# Patient Record
Sex: Female | Born: 1965 | Race: Black or African American | Hispanic: No | Smoking: Former smoker
Health system: Southern US, Community
[De-identification: ages and names within clinical notes are randomized; demographics above are authoritative.]

## PROBLEM LIST (undated history)

## (undated) DIAGNOSIS — J449 Chronic obstructive pulmonary disease, unspecified: Secondary | ICD-10-CM

## (undated) DIAGNOSIS — I214 Non-ST elevation (NSTEMI) myocardial infarction: Secondary | ICD-10-CM

## (undated) DIAGNOSIS — I509 Heart failure, unspecified: Secondary | ICD-10-CM

## (undated) DIAGNOSIS — I5181 Takotsubo syndrome: Secondary | ICD-10-CM

## (undated) DIAGNOSIS — E785 Hyperlipidemia, unspecified: Secondary | ICD-10-CM

## (undated) HISTORY — DX: Takotsubo syndrome: I51.81

## (undated) HISTORY — PX: WISDOM TOOTH EXTRACTION: SHX21

---

## 2000-09-29 ENCOUNTER — Inpatient Hospital Stay (HOSPITAL_COMMUNITY): Admission: AD | Admit: 2000-09-29 | Discharge: 2000-09-29 | Payer: Self-pay | Admitting: Obstetrics

## 2001-01-04 ENCOUNTER — Emergency Department (HOSPITAL_COMMUNITY): Admission: EM | Admit: 2001-01-04 | Discharge: 2001-01-04 | Payer: Self-pay | Admitting: Emergency Medicine

## 2001-06-22 ENCOUNTER — Encounter (HOSPITAL_BASED_OUTPATIENT_CLINIC_OR_DEPARTMENT_OTHER): Payer: Self-pay | Admitting: General Surgery

## 2001-06-24 ENCOUNTER — Encounter (INDEPENDENT_AMBULATORY_CARE_PROVIDER_SITE_OTHER): Payer: Self-pay | Admitting: *Deleted

## 2001-06-24 ENCOUNTER — Ambulatory Visit (HOSPITAL_COMMUNITY): Admission: RE | Admit: 2001-06-24 | Discharge: 2001-06-24 | Payer: Self-pay | Admitting: General Surgery

## 2005-07-02 ENCOUNTER — Emergency Department (HOSPITAL_COMMUNITY): Admission: EM | Admit: 2005-07-02 | Discharge: 2005-07-02 | Payer: Self-pay | Admitting: Family Medicine

## 2008-03-18 ENCOUNTER — Emergency Department (HOSPITAL_COMMUNITY): Admission: EM | Admit: 2008-03-18 | Discharge: 2008-03-18 | Payer: Self-pay | Admitting: Emergency Medicine

## 2011-04-03 NOTE — Op Note (Signed)
[name]  Patient:    Caitlin [name], Caitlin [name]                       MRN: [id number] Proc. Date: 06/24/01 Adm. Date:  [id number] Disc. Date: [id number] Attending:  Sonda Primes                           Operative Report  PREOPERATIVE DIAGNOSES: 1. Inclusion cyst of right breast. 2. Lipoma, right upper arm.  POSTOPERATIVE DIAGNOSES: 1. Inclusion cyst of right breast. 2. Lipoma, right upper arm.  PROCEDURES: 1. Excision of inclusion cyst of right breast. 2. Excision of lipoma of right upper arm.  SURGEON:  Mardene Celeste. Lurene Shadow, M.D.  ASSISTANT:  Nurse.  ANESTHESIA:  General.  CLINICAL NOTE:  This patient is a 45 year old woman presenting with an enlarging inclusion cyst at the areolar border of the right breast, located somewhat medially, measuring approximately 2.5 cm in greatest diameter, and a very large, approximately 20 cm lipoma of the right upper arm posterior located over the trapezius muscles.  She is brought to the operating room now after the discussion of the procedure, risks, and benefits were discussed with her and consent obtained.  DESCRIPTION OF PROCEDURE:  Following the induction of satisfactory anesthesia with the patient positioned supinely, the right breast and arm were prepped and draped to be include in the sterile operative field.  The cyst was first approached with an elliptical incision, deepened through the skin and subcutaneous tissues, down to the cyst wall.  The cyst was dissected free in its entirety without rupture and removed and forwarded for pathologic evaluation.  Hemostasis was obtained with electrocautery and the skin closed with running suture of 5-0 Monocryl and reinforced with a Steri-Strip. Attention then turned to this large mass on the posterior right arm.  I made a vertical incision on the posterior medial aspect of the arm, deepened this through the skin and subcutaneous tissue, carrying the  dissection down to the capsule of the lipoma.  The capsule was opened, and the lipoma was then dissected free from the surrounding tissues.  There were multiple loculations entering into the trapezius muscle, which had to be opened and removed.  The entire lipoma was removed and forwarded for pathologic evaluation.  Hemostasis was obtained with electrocautery.  Sponge, instrument, and sharp counts verified and the wound closed as follows:  The subcutaneous tissue was closed with interrupted 3-0 Vicryl sutures and the skin closed with a 5-0 running Monocryl suture.  The wound was also reinforced with Steri-Strips and a sterile dressing applied.  Anesthetic reversed, patient removed from the operating room to the recovery room in stable condition.  She tolerated the procedure well. DD:  06/24/01 TD:  06/25/01 Job: [id number]

## 2011-08-18 ENCOUNTER — Emergency Department (HOSPITAL_COMMUNITY)
Admission: EM | Admit: 2011-08-18 | Discharge: 2011-08-18 | Discharge status: Against Medical Advice | Payer: Self-pay | Attending: Emergency Medicine | Admitting: Emergency Medicine

## 2011-08-18 DIAGNOSIS — Z0389 Encounter for observation for other suspected diseases and conditions ruled out: Secondary | ICD-10-CM | POA: Insufficient documentation

## 2012-04-22 ENCOUNTER — Emergency Department (HOSPITAL_COMMUNITY): Payer: Medicaid Other

## 2012-04-22 ENCOUNTER — Emergency Department (HOSPITAL_COMMUNITY)
Admission: EM | Admit: 2012-04-22 | Discharge: 2012-04-22 | Disposition: A | Discharge status: Home / Self Care | Payer: Medicaid Other | Attending: Emergency Medicine | Admitting: Emergency Medicine

## 2012-04-22 ENCOUNTER — Encounter (HOSPITAL_COMMUNITY): Payer: Self-pay | Admitting: *Deleted

## 2012-04-22 DIAGNOSIS — R079 Chest pain, unspecified: Secondary | ICD-10-CM | POA: Insufficient documentation

## 2012-04-22 DIAGNOSIS — R059 Cough, unspecified: Secondary | ICD-10-CM | POA: Insufficient documentation

## 2012-04-22 DIAGNOSIS — F17200 Nicotine dependence, unspecified, uncomplicated: Secondary | ICD-10-CM | POA: Insufficient documentation

## 2012-04-22 DIAGNOSIS — R05 Cough: Secondary | ICD-10-CM | POA: Insufficient documentation

## 2012-04-22 DIAGNOSIS — J4489 Other specified chronic obstructive pulmonary disease: Secondary | ICD-10-CM | POA: Insufficient documentation

## 2012-04-22 DIAGNOSIS — R0602 Shortness of breath: Secondary | ICD-10-CM | POA: Insufficient documentation

## 2012-04-22 DIAGNOSIS — J449 Chronic obstructive pulmonary disease, unspecified: Secondary | ICD-10-CM

## 2012-04-22 LAB — CBC
HCT: 38.4 % (ref 36.0–46.0)
MCH: 25.1 pg — ABNORMAL LOW (ref 26.0–34.0)
MCHC: 33.3 g/dL (ref 30.0–36.0)
MCV: 75.3 fL — ABNORMAL LOW (ref 78.0–100.0)
Platelets: 452 10*3/uL — ABNORMAL HIGH (ref 150–400)
RDW: 17 % — ABNORMAL HIGH (ref 11.5–15.5)

## 2012-04-22 LAB — BASIC METABOLIC PANEL
BUN: 6 mg/dL (ref 6–23)
Calcium: 10 mg/dL (ref 8.4–10.5)
Creatinine, Ser: 0.66 mg/dL (ref 0.50–1.10)
GFR calc Af Amer: >90 mL/min (ref >90)

## 2012-04-22 LAB — PRO B NATRIURETIC PEPTIDE: Pro B Natriuretic peptide (BNP): 79 pg/mL (ref 0–125)

## 2012-04-22 MED ORDER — ALBUTEROL SULFATE (5 MG/ML) 0.5% IN NEBU
2.5000 mg | INHALATION_SOLUTION | Freq: Once | RESPIRATORY_TRACT | Status: AC
  Administered 2012-04-22: 2.5 mg via RESPIRATORY_TRACT
  Filled 2012-04-22: qty 0.5

## 2012-04-22 MED ORDER — IPRATROPIUM BROMIDE 0.02 % IN SOLN
RESPIRATORY_TRACT | Status: AC
  Administered 2012-04-22: 14:00:00
  Filled 2012-04-22: qty 2.5

## 2012-04-22 MED ORDER — ALBUTEROL SULFATE (5 MG/ML) 0.5% IN NEBU
INHALATION_SOLUTION | RESPIRATORY_TRACT | Status: AC
  Administered 2012-04-22: 14:00:00
  Filled 2012-04-22: qty 1

## 2012-04-22 MED ORDER — ALBUTEROL SULFATE (5 MG/ML) 0.5% IN NEBU
INHALATION_SOLUTION | RESPIRATORY_TRACT | Status: AC
  Filled 2012-04-22: qty 0.5

## 2012-04-22 MED ORDER — PREDNISONE 20 MG PO TABS: 60.0000 mg | ORAL_TABLET | Freq: Every day | ORAL | Status: DC

## 2012-04-22 MED ORDER — METHYLPREDNISOLONE SODIUM SUCC 125 MG IJ SOLR
125.0000 mg | Freq: Once | INTRAMUSCULAR | Status: AC
  Administered 2012-04-22: 125 mg via INTRAVENOUS
  Filled 2012-04-22 (×2): qty 2

## 2012-04-22 MED ORDER — IPRATROPIUM BROMIDE 0.02 % IN SOLN
0.5000 mg | Freq: Once | RESPIRATORY_TRACT | Status: AC
  Administered 2012-04-22: 0.5 mg via RESPIRATORY_TRACT
  Filled 2012-04-22: qty 2.5

## 2012-04-22 MED ORDER — ALBUTEROL SULFATE HFA 108 (90 BASE) MCG/ACT IN AERS
2.0000 | INHALATION_SPRAY | RESPIRATORY_TRACT | Status: DC | PRN
  Administered 2012-04-22: 2 via RESPIRATORY_TRACT
  Filled 2012-04-22: qty 6.7

## 2012-04-22 NOTE — ED Provider Notes (Addendum)
History     CSN: [id number]  Arrival date & time 04/22/12  1054   First MD Initiated Contact with Patient 04/22/12 1339      Chief Complaint  Patient presents with  . Shortness of Breath  . Chest Pain    (Consider location/radiation/quality/duration/timing/severity/associated sxs/prior treatment) Patient is a 46 y.o. female presenting with shortness of breath and chest pain. The history is provided by the patient.  Shortness of Breath  Associated symptoms include chest pain and shortness of breath.  Chest Pain Primary symptoms include shortness of breath.   She last 2 days with a cough productive of brown sputum. This is been associated with severe dyspnea denies fever. Symptoms are worse at night. Nothing makes her feel any better. She has not taken any medication. She does not recall having episodes like this before. She does smoke about three quarters of a pack of cigarettes a day.  History reviewed. No pertinent past medical history.  History reviewed. No pertinent past surgical history.  No family history on file.  History  Substance Use Topics  . Smoking status: Current Everyday Smoker  . Smokeless tobacco: Not on file  . Alcohol Use: No    OB History    Grav Para Term Preterm Abortions TAB SAB Ect Mult Living                  Review of Systems  Respiratory: Positive for shortness of breath.   Cardiovascular: Positive for chest pain.  All other systems reviewed and are negative.    Allergies  Review of patient's allergies indicates no known allergies.  Home Medications  No current outpatient prescriptions on file.  BP 131/86  Pulse 119  Temp(Src) 98.7 F (37.1 C) (Oral)  Resp 20  Ht 5\' 9"  (1.753 m)  Wt 133 lb (60.328 kg)  BMI 19.64 kg/m2  SpO2 100%  LMP 04/01/2012  Physical Exam  Nursing note and vitals reviewed.  female in mild respiratory distress. Vital signs are significant for tachycardia with heart rate 128, tachypnea with respiratory  rate of 40. Oxygen saturation is 90% which is normal. Head is normocephalic and atraumatic. PERRLA, EOMI. Oropharynx is clear. Neck is nontender and supple without adenopathy or JVD. Lungs have diffuse wheezes without rales or rhonchi. Heart has regular rate and rhythm which is tachycardic. No murmurs heard. Abdomen is soft, flat, nontender without masses or hepatosplenomegaly. Extremities have no cyanosis or edema, full range of motion is present. Warm and dry without rash. Neurologic: Mental status is normal, cranial nerves are intact, there are no motor or sensory deficits.  ED Course  Procedures (including critical care time)  Results for orders placed during the hospital encounter of 04/22/12  CBC      Component Value Range   WBC 8.7  4.0 - 10.5 (K/uL)   RBC 5.10  3.87 - 5.11 (MIL/uL)   Hemoglobin 12.8  12.0 - 15.0 (g/dL)   HCT 40.9  81.1 - 91.4 (%)   MCV 75.3 (*) 78.0 - 100.0 (fL)   MCH 25.1 (*) 26.0 - 34.0 (pg)   MCHC 33.3  30.0 - 36.0 (g/dL)   RDW 78.2 (*) 95.6 - 15.5 (%)   Platelets 452 (*) 150 - 400 (K/uL)  BASIC METABOLIC PANEL      Component Value Range   Sodium 140  135 - 145 (mEq/L)   Potassium 4.4  3.5 - 5.1 (mEq/L)   Chloride 106  96 - 112 (mEq/L)   CO2 20  19 - 32 (mEq/L)   Glucose, Bld 90  70 - 99 (mg/dL)   BUN 6  6 - 23 (mg/dL)   Creatinine, Ser 1.61  0.50 - 1.10 (mg/dL)   Calcium 09.6  8.4 - 10.5 (mg/dL)   GFR calc non Af Amer >90  >90 (mL/min)   GFR calc Af Amer >90  >90 (mL/min)  PRO B NATRIURETIC PEPTIDE      Component Value Range   Pro B Natriuretic peptide (BNP) 79.0  0 - 125 (pg/mL)  POCT I-STAT TROPONIN I      Component Value Range   Troponin i, poc 0.00  0.00 - 0.08 (ng/mL)   Comment 3            Dg Chest 2 View  04/22/2012  *RADIOLOGY REPORT*  Clinical Data: Cough, shortness of breath for several days, mid chest pain, smoking history  CHEST - 2 VIEW  Comparison: None.  Findings: The lungs are clear but very hyperaerated consistent with an element of  obstructive pulmonary disease.  No pneumothorax is seen.  Mediastinal contours are normal.  The heart is within normal limits in size.  No bony abnormality is seen.  IMPRESSION: Significant hyperaeration consistent with COPD.  No active process.  Original Report Authenticated By: Juline Patch, M.D.      1. COPD (chronic obstructive pulmonary disease)       MDM  Acute bronchospasm, rule out pneumonia. She'll be given steroids and albuterol with Atrovent treatment, she is more comfortable. She is still using accessory muscles but is less distress.   At the completion to her treatment, she was feeling much better. She was no longer using accessory muscles of respiration and there were only faint wheezes heard. She will be given a second albuterol nebulizer treatment with a prescription for prednisone and albuterol inhaler.   Dione Booze, MD 04/22/12 1626   Date: 07/24/2012  Rate: 116  Rhythm: sinus tachycardia  QRS Axis: normal  Intervals: normal  ST/T Wave abnormalities: normal  Conduction Disutrbances:none  Narrative Interpretation: Sinus tachycardia, left atrial hypertrophy and right atrial hypertrophy. When compared with ECG of 07/02/2005, by atrial enlargement is now present.  Old EKG Reviewed: changes noted    Dione Booze, MD 07/24/12 1622

## 2012-04-22 NOTE — Discharge Instructions (Signed)
Chronic Obstructive Pulmonary Disease Chronic obstructive pulmonary disease (COPD) is a condition in which airflow from the lungs is restricted. The lungs can never return to normal, but there are measures you can take which will improve them and make you feel better. CAUSES   Smoking.   Exposure to secondhand smoke.   Breathing in irritants (pollution, cigarette smoke, strong smells, aerosol sprays, paint fumes).   History of lung infections.  TREATMENT  Treatment focuses on making you comfortable (supportive care). Your caregiver may prescribe medications (inhaled or pills) to help improve your breathing. HOME CARE INSTRUCTIONS   If you smoke, stop smoking.   Avoid exposure to smoke, chemicals, and fumes that aggravate your breathing.   Take antibiotic medicines as directed by your caregiver.   Avoid medicines that dry up your system and slow down the elimination of secretions (antihistamines and cough syrups). This decreases respiratory capacity and may lead to infections.   Drink enough water and fluids to keep your urine clear or pale yellow. This loosens secretions.   Use humidifiers at home and at your bedside if they do not make breathing difficult.   Receive all protective vaccines your caregiver suggests, especially pneumococcal and influenza.   Use home oxygen as suggested.   Stay active. Exercise and physical activity will help maintain your ability to do things you want to do.   Eat a healthy diet.  SEEK MEDICAL CARE IF:   You develop pus-like mucus (sputum).   Breathing is more labored or exercise becomes difficult to do.   You are running out of the medicine you take for your breathing.  SEEK IMMEDIATE MEDICAL CARE IF:   You have a rapid heart rate.   You have agitation, confusion, tremors, or are in a stupor (family members may need to observe this).   It becomes difficult to breathe.   You develop chest pain.   You have a fever.  MAKE SURE YOU:    Understand these instructions.   Will watch your condition.   Will get help right away if you are not doing well or get worse.  Document Released: 08/12/2005 Document Revised: 10/22/2011 Document Reviewed: 01/02/2011 [facility] Patient Information 2012 [place], [place].  Smoking Hazards Smoking cigarettes is extremely bad for your health. Tobacco smoke has over 200 known poisons in it. There are over 60 chemicals in tobacco smoke that cause cancer. Some of the chemicals found in cigarette smoke include:   Cyanide.   Benzene.   Formaldehyde.   Methanol (wood alcohol).   Acetylene (fuel used in welding torches).   Ammonia.  Cigarette smoke also contains the poisonous gases nitrogen oxide and carbon monoxide.  Cigarette smokers have an increased risk of many serious medical problems, including:  Lung cancer.   Lung disease (such as pneumonia, bronchitis, and emphysema).   Heart attack and chest pain due to the heart not getting enough oxygen (angina).   Heart disease and peripheral blood vessel disease.   Hypertension.   Stroke.   Oral cancer (cancer of the lip, mouth, or voice box).   Bladder cancer.   Pancreatic cancer.   Cervical cancer.   Pregnancy complications, including premature birth.   Low birthweight babies.   Early menopause.   Lower estrogen level for women.   Infertility.   Facial wrinkles.   Blindness.   Increased risk of broken bones (fractures).   Senile dementia.   Stillbirths and smaller newborn babies, birth defects, and genetic damage to sperm.   Stomach ulcers  and internal bleeding.  Children of smokers have an increased risk of the following, because of secondhand smoke exposure:   Sudden infant death syndrome (SIDS).   Respiratory infections.   Lung cancer.   Heart disease.   Ear infections.  Smoking causes approximately:  90% of all lung cancer deaths in men.   80% of all lung cancer deaths in women.   90% of  deaths from chronic obstructive lung disease.  Compared with nonsmokers, smoking increases the risk of:  Coronary heart disease by 2 to 4 times.   Stroke by 2 to 4 times.   Men developing lung cancer by 23 times.   Women developing lung cancer by 13 times.   Dying from chronic obstructive lung diseases by 12 times.  Someone who smokes 2 packs a day loses about 8 years of his or her life. Even smoking lightly shortens your life expectancy by several years. You can greatly reduce the risk of medical problems for you and your family by stopping now. Smoking is the most preventable cause of death and disease in our society. Within days of quitting smoking, your circulation returns to normal, you decrease the risk of having a heart attack, and your lung capacity improves. There may be some increased phlegm in the first few days after quitting, and it may take months for your lungs to clear up completely. Quitting for 10 years cuts your lung cancer risk to almost that of a nonsmoker. WHY IS SMOKING ADDICTIVE?  Nicotine is the chemical agent in tobacco that is capable of causing addiction or dependence.   When you smoke and inhale, nicotine is absorbed rapidly into the bloodstream through your lungs. Nicotine absorbed through the lungs is capable of creating a powerful addiction. Both inhaled and non-inhaled nicotine may be addictive.   Addiction studies of cigarettes and spit tobacco show that addiction to nicotine occurs mainly during the teen years, when young people begin using tobacco products.  WHAT ARE THE BENEFITS OF QUITTING?  There are many health benefits to quitting smoking.   Likelihood of developing cancer and heart disease decreases. Health improvements are seen almost immediately.   Blood pressure, pulse rate, and breathing patterns start returning to normal soon after quitting.   People who quit may see an improvement in their overall quality of life.  Some people choose to quit  all at once. Other options include nicotine replacement products, such as patches, gum, and nasal sprays. Do not use these products without first checking with your caregiver. QUITTING SMOKING It is not easy to quit smoking. Nicotine is addicting, and longtime habits are hard to change. To start, you can write down all your reasons for quitting, tell your family and friends you want to quit, and ask for their help. Throw your cigarettes away, chew gum or cinnamon sticks, keep your hands busy, and drink extra water or juice. Go for walks and practice deep breathing to relax. Think of all the money you are saving: around $1,000 a year, for the average pack-a-day smoker. Nicotine patches and gum have been shown to improve success at efforts to stop smoking. Zyban (bupropion) is an anti-depressant drug that can be prescribed to reduce nicotine withdrawal symptoms and to suppress the urge to smoke. Smoking is an addiction with both physical and psychological effects. Joining a stop-smoking support group can help you cope with the emotional issues. For more information and advice on programs to stop smoking, call your doctor, your local hospital, or these  organizations:  American Lung Association - 1-800-LUNGUSA   American Cancer Society - 1-800-ACS-2345  Document Released: 12/10/2004 Document Revised: 10/22/2011 Document Reviewed: 08/14/2009 [facility] Patient Information 2012 [place], [place].  Albuterol inhalation aerosol What is this medicine? ALBUTEROL (al Gaspar Bidding) is a bronchodilator. It helps open up the airways in your lungs to make it easier to breathe. This medicine is used to treat and to prevent bronchospasm. This medicine may be used for other purposes; ask your health care provider or pharmacist if you have questions. What should I tell my health care provider before I take this medicine? They need to know if you have any of the following conditions: -diabetes -heart disease or irregular  heartbeat -high blood pressure -pheochromocytoma -seizures -thyroid disease -an unusual or allergic reaction to albuterol, levalbuterol, sulfites, other medicines, foods, dyes, or preservatives -pregnant or trying to get pregnant -breast-feeding How should I use this medicine? This medicine is for inhalation through the mouth. Follow the directions on your prescription label. Take your medicine at regular intervals. Do not use more often than directed. Make sure that you are using your inhaler correctly. Ask you doctor or health care provider if you have any questions. Use this medicine before you use any other inhaler. Wait 5 minutes or more before between using different inhalers. Talk to your pediatrician regarding the use of this medicine in children. Special care may be needed. Overdosage: If you think you have taken too much of this medicine contact a poison control center or emergency room at once. NOTE: This medicine is only for you. Do not share this medicine with others. What if I miss a dose? If you miss a dose, use it as soon as you can. If it is almost time for your next dose, use only that dose. Do not use double or extra doses. What may interact with this medicine? -anti-infectives like chloroquine and pentamidine -caffeine -cisapride -diuretics -medicines for colds -medicines for depression or for emotional or psychotic conditions -medicines for weight loss including some herbal products -methadone -some antibiotics like clarithromycin, erythromycin, levofloxacin, and linezolid -some heart medicines -steroid hormones like dexamethasone, cortisone, hydrocortisone -theophylline -thyroid hormones This list may not describe all possible interactions. Give your health care provider a list of all the medicines, herbs, non-prescription drugs, or dietary supplements you use. Also tell them if you smoke, drink alcohol, or use illegal drugs. Some items may interact with your  medicine. What should I watch for while using this medicine? Tell your doctor or health care professional if your symptoms do not improve. Do not use extra albuterol. If your asthma or bronchitis gets worse while you are using this medicine, call your doctor right away. If your mouth gets dry try chewing sugarless gum or sucking hard candy. Drink water as directed. What side effects may I notice from receiving this medicine? Side effects that you should report to your doctor or health care professional as soon as possible: -allergic reactions like skin rash, itching or hives, swelling of the face, lips, or tongue -breathing problems -chest pain -feeling faint or lightheaded, falls -high blood pressure -irregular heartbeat -fever -muscle cramps or weakness -pain, tingling, numbness in the hands or feet -vomiting Side effects that usually do not require medical attention (report to your doctor or health care professional if they continue or are bothersome): -cough -difficulty sleeping -headache -nervousness or trembling -stomach upset -stuffy or runny nose -throat irritation -unusual taste This list may not describe all possible side effects. Call  your doctor for medical advice about side effects. You may report side effects to FDA at 1-800-FDA-1088. Where should I keep my medicine? Keep out of the reach of children. Store at room temperature between 15 and 30 degrees C (59 and 86 degrees F). The contents are under pressure and may burst when exposed to heat or flame. Do not freeze. This medicine does not work as well if it is too cold. Throw away any unused medicine after the expiration date. Inhalers need to be thrown away after the labeled number of puffs have been used or by the expiration date; whichever comes first. Ventolin HFA should be thrown away 12 months after removing from foil pouch. Check the instructions that come with your medicine. NOTE: This sheet is a summary. It may  not cover all possible information. If you have questions about this medicine, talk to your doctor, pharmacist, or health care provider.  2012, Elsevier/Gold Standard. (03/20/2011 11:00:52 AM)  Prednisone tablets What is this medicine? PREDNISONE (PRED ni sone) is a corticosteroid. It is commonly used to treat inflammation of the skin, joints, lungs, and other organs. Common conditions treated include asthma, allergies, and arthritis. It is also used for other conditions, such as blood disorders and diseases of the adrenal glands. This medicine may be used for other purposes; ask your health care provider or pharmacist if you have questions. What should I tell my health care provider before I take this medicine? They need to know if you have any of these conditions: -Cushing's syndrome -diabetes -glaucoma -heart disease -high blood pressure -infection (especially a virus infection such as chickenpox, cold sores, or herpes) -kidney disease -liver disease -mental illness -myasthenia gravis -osteoporosis -seizures -stomach or intestine problems -thyroid disease -an unusual or allergic reaction to lactose, prednisone, other medicines, foods, dyes, or preservatives -pregnant or trying to get pregnant -breast-feeding How should I use this medicine? Take this medicine by mouth with a glass of water. Follow the directions on the prescription label. Take this medicine with food. If you are taking this medicine once a day, take it in the morning. Do not take more medicine than you are told to take. Do not suddenly stop taking your medicine because you may develop a severe reaction. Your doctor will tell you how much medicine to take. If your doctor wants you to stop the medicine, the dose may be slowly lowered over time to avoid any side effects. Talk to your pediatrician regarding the use of this medicine in children. Special care may be needed. Overdosage: If you think you have taken too much of  this medicine contact a poison control center or emergency room at once. NOTE: This medicine is only for you. Do not share this medicine with others. What if I miss a dose? If you miss a dose, take it as soon as you can. If it is almost time for your next dose, talk to your doctor or health care professional. You may need to miss a dose or take an extra dose. Do not take double or extra doses without advice. What may interact with this medicine? Do not take this medicine with any of the following medications: -metyrapone -mifepristone This medicine may also interact with the following medications: -aminoglutethimide -amphotericin B -aspirin and aspirin-like medicines -barbiturates -certain medicines for diabetes, like glipizide or glyburide -cholestyramine -cholinesterase inhibitors -cyclosporine -digoxin -diuretics -ephedrine -female hormones, like estrogens and birth control pills -isoniazid -ketoconazole -NSAIDS, medicines for pain and inflammation, like ibuprofen or naproxen -phenytoin -  rifampin -toxoids -vaccines -warfarin This list may not describe all possible interactions. Give your health care provider a list of all the medicines, herbs, non-prescription drugs, or dietary supplements you use. Also tell them if you smoke, drink alcohol, or use illegal drugs. Some items may interact with your medicine. What should I watch for while using this medicine? Visit your doctor or health care professional for regular checks on your progress. If you are taking this medicine over a prolonged period, carry an identification card with your name and address, the type and dose of your medicine, and your doctor's name and address. This medicine may increase your risk of getting an infection. Tell your doctor or health care professional if you are around anyone with measles or chickenpox, or if you develop sores or blisters that do not heal properly. If you are going to have surgery, tell  your doctor or health care professional that you have taken this medicine within the last twelve months. Ask your doctor or health care professional about your diet. You may need to lower the amount of salt you eat. This medicine may affect blood sugar levels. If you have diabetes, check with your doctor or health care professional before you change your diet or the dose of your diabetic medicine. What side effects may I notice from receiving this medicine? Side effects that you should report to your doctor or health care professional as soon as possible: -allergic reactions like skin rash, itching or hives, swelling of the face, lips, or tongue -changes in emotions or moods -changes in vision -depressed mood -eye pain -fever or chills, cough, sore throat, pain or difficulty passing urine -increased thirst -swelling of ankles, feet Side effects that usually do not require medical attention (report to your doctor or health care professional if they continue or are bothersome): -confusion, excitement, restlessness -headache -nausea, vomiting -skin problems, acne, thin and shiny skin -trouble sleeping -weight gain This list may not describe all possible side effects. Call your doctor for medical advice about side effects. You may report side effects to FDA at 1-800-FDA-1088. Where should I keep my medicine? Keep out of the reach of children. Store at room temperature between 15 and 30 degrees C (59 and 86 degrees F). Protect from light. Keep container tightly closed. Throw away any unused medicine after the expiration date. NOTE: This sheet is a summary. It may not cover all possible information. If you have questions about this medicine, talk to your doctor, pharmacist, or health care provider.  2012, Elsevier/Gold Standard. (06/18/2011 10:57:14 AM)

## 2012-04-22 NOTE — ED Notes (Signed)
Pt receiving breathing tx.  St's she has to leave by 4:00.

## 2012-04-22 NOTE — ED Notes (Signed)
Patient with reported cough for 2 days.  She states today she is short of breath to the point she can hardly walk.  Patient states her chest is killing her

## 2012-04-25 ENCOUNTER — Encounter (HOSPITAL_COMMUNITY): Payer: Self-pay | Admitting: Emergency Medicine

## 2016-09-09 ENCOUNTER — Encounter (HOSPITAL_COMMUNITY): Payer: Self-pay | Admitting: Emergency Medicine

## 2016-09-09 ENCOUNTER — Ambulatory Visit (HOSPITAL_COMMUNITY)
Admission: EM | Admit: 2016-09-09 | Discharge: 2016-09-09 | Disposition: A | Discharge status: Home / Self Care | Payer: Self-pay | Attending: Emergency Medicine | Admitting: Emergency Medicine

## 2016-09-09 DIAGNOSIS — N939 Abnormal uterine and vaginal bleeding, unspecified: Secondary | ICD-10-CM

## 2016-09-09 DIAGNOSIS — J449 Chronic obstructive pulmonary disease, unspecified: Secondary | ICD-10-CM | POA: Insufficient documentation

## 2016-09-09 DIAGNOSIS — F17200 Nicotine dependence, unspecified, uncomplicated: Secondary | ICD-10-CM | POA: Insufficient documentation

## 2016-09-09 MED ORDER — IBUPROFEN 600 MG PO TABS: 600.0000 mg | ORAL_TABLET | Freq: Four times a day (QID) | ORAL | 0 refills | Status: DC | PRN

## 2016-09-09 NOTE — ED Notes (Signed)
Verified phone number 

## 2016-09-09 NOTE — ED Notes (Signed)
Patient has some mild, crampy feeling in abdomen.  Noticed this last night.  Patient has some vaginal spotting.  Patient has not had a period in approximately 2 years and reports last pap was possible 20 years ago.

## 2016-09-09 NOTE — ED Provider Notes (Signed)
HPI  SUBJECTIVE:  Caitlin [name] is a 50 y.o. female who presents with light vaginal bleeding that she describes as spotting and occasionally heavy starting 3 days ago. She is going through one pad per day. She states that she had intercourse over the weekend, but was fine for a day or 2. She reports mild low back pain. No aggravating or alleviating factors. She has not tried anything for this. She denies nausea, vomiting, fevers. No abdominal pain, pelvic pain. No dysuria, urgency, frequency, cloudy or odorous urine, hematuria. No genital rash, discharge, vaginal odor. She is in a new monogamous relationship with a female partner, who is asymptomatic. STDs are not a concern today. She has a past medical history COPD. She is a smoker. She has no past medical history of cancer, coagulopathy, she is not on any antiplatelets or anticoagulants. No history of gonorrhea, chlamydia, herpes, HIV, syphilis, Trichomonas, BV, yeast infections. No history of PID. LMP: 2 years ago. PMD: None.    History reviewed. No pertinent past medical history.  History reviewed. No pertinent surgical history.  History reviewed. No pertinent family history.  Social History  Substance Use Topics  . Smoking status: Current Every Day Smoker  . Smokeless tobacco: Never Used  . Alcohol use No    No current facility-administered medications for this encounter.   Current Outpatient Prescriptions:  .  ibuprofen (ADVIL,MOTRIN) 600 MG tablet, Take 1 tablet (600 mg total) by mouth every 6 (six) hours as needed., Disp: 30 tablet, Rfl: 0  No Known Allergies   ROS  As noted in HPI.   Physical Exam  BP 160/94 (BP Location: Right Arm)   Pulse 67   Temp 98.4 F (36.9 C) (Oral)   Resp 18   LMP 09/09/2014 (Approximate)   SpO2 99%   Constitutional: Well developed, well nourished, no acute distress Eyes:  EOMI, conjunctiva normal bilaterally HENT: Normocephalic, atraumatic,mucus membranes moist Respiratory: Normal  inspiratory effort Cardiovascular: Normal rate GI: nondistended, Soft, nontender. No suprapubic tenderness. GU: Normal external genitalia. Small amount of brownish vaginal bleeding from os. No active bleeding. No hemorrhage. No mass. No trauma to the cervix. No discharge. No odor. No CMT. No adnexal mass. Uterus smooth, nontender. Chaperone present during exam. skin: No rash, skin intact Musculoskeletal: no deformities Neurologic: Alert & oriented x 3, no focal neuro deficits Psychiatric: Speech and behavior appropriate   ED Course   Medications - No data to display  Orders Placed This Encounter  Procedures  . Pelvic exam    Standing Status:   Standing    Number of Occurrences:   1  . Ambulatory referral to Gynecology    Referral Priority:   Routine    Referral Type:   Consultation    Referral Reason:   Specialty Services Required    Requested Specialty:   Gynecology    Number of Visits Requested:   1    No results found for this or any previous visit (from the past 24 hour(s)). No results found.  ED Clinical Impression  Vaginal bleeding   ED Assessment/Plan  Vitals normal. Patient appears well. Abdomen benign. Presentation most consistent with abnormal uterine bleeding. We'll not treat empirically for STDs today. Feel the patient is low risk enough. Also not treating for BV, yeast, or Trichomonas as patient has no history of this. We'll wait for lab results. We'll send home with 600 mg ibuprofen, provide GYN referral. She is to give [place] a working phone number so that we can  contact her if she and her partner need treatment.  Discussed MDM, plan and followup with patient. Discussed sn/sx that should prompt return to the ED. Patient  agrees with plan.   Meds ordered this encounter  Medications  . ibuprofen (ADVIL,MOTRIN) 600 MG tablet    Sig: Take 1 tablet (600 mg total) by mouth every 6 (six) hours as needed.    Dispense:  30 tablet    Refill:  0    *This clinic note  was created using [employer]. Therefore, there may be occasional mistakes despite careful proofreading.  ?   Melynda Ripple, MD 09/09/16 [phone]

## 2016-09-09 NOTE — Discharge Instructions (Signed)
Take the medication as written. Give [place] a working phone number so that we can contact you if needed. Refrain from sexual contact until you know your results and your partner are treated if necessary.   Go to [email or web address] to look up your medications. This will give you a list of where you can find your prescriptions at the most affordable prices.

## 2016-09-09 NOTE — ED Triage Notes (Signed)
The patient presented to the [facility] with a complaint of vaginal bleeding that started 3 days ago. The patient reported no pain but cramping. The patient stated that her last menstrual period was 2 years ago.

## 2016-09-10 LAB — CERVICOVAGINAL ANCILLARY ONLY
CHLAMYDIA, DNA PROBE: NEGATIVE
NEISSERIA GONORRHEA: NEGATIVE
Wet Prep (BD Affirm): POSITIVE — AB

## 2016-09-16 ENCOUNTER — Telehealth (HOSPITAL_COMMUNITY): Payer: Self-pay | Admitting: Emergency Medicine

## 2016-09-16 NOTE — Telephone Encounter (Signed)
LM on [phone] Need to give lab results and to see how pt is doing from recent visit on 10/25 Notified pt in gen message that there is NO need to call back Unless not feeling any better, not tolerating meds well or if wanting to know lab results. Also let pt know labs can be obtained from [facility]

## 2016-09-16 NOTE — Telephone Encounter (Signed)
Pt called back.... Called pt and notified of recent lab results Pt ID'd properly... Reports feeling better and sx have subsided States she does not need further tx.  Adv pt if sx are not getting better to return or to f/u w/PCP Education on safe sex given Pt verb understanding.

## 2016-09-16 NOTE — Telephone Encounter (Signed)
-----   Message from Sherlene Shams, MD sent at 09/12/2016 11:14 AM EDT ----- Clinical staff, please let patient know that test for gardnerella (bacterial vaginosis) was positive.   This only needs to be treated if patient has symptoms such as vaginal irritation/discharge. If having vaginal irritation/discharge, would treat with metronidazole '500mg'$  bid x 7d #14, no refills. Bacterial vaginosis does not cause abnormal uterine bleeding (spotting), which patient presented with.   She will still need to folllowup with gynecology as discussed at [facility] visit 09/09/16.  LM

## 2016-11-16 HISTORY — PX: TRACHEOSTOMY CLOSURE: SHX458

## 2017-07-15 ENCOUNTER — Encounter (HOSPITAL_COMMUNITY)
Admission: EM | Disposition: A | Discharge status: Home / Self Care | Payer: Self-pay | Source: Home / Self Care | Attending: Pulmonary Disease

## 2017-07-15 ENCOUNTER — Inpatient Hospital Stay (HOSPITAL_COMMUNITY): Payer: Self-pay

## 2017-07-15 ENCOUNTER — Other Ambulatory Visit: Payer: Self-pay

## 2017-07-15 ENCOUNTER — Emergency Department (HOSPITAL_COMMUNITY): Payer: Self-pay

## 2017-07-15 ENCOUNTER — Encounter (HOSPITAL_COMMUNITY): Payer: Self-pay | Admitting: Emergency Medicine

## 2017-07-15 ENCOUNTER — Inpatient Hospital Stay (HOSPITAL_COMMUNITY)
Admission: EM | Admit: 2017-07-15 | Discharge: 2017-07-19 | DRG: 280 | Disposition: A | Discharge status: Home / Self Care | Payer: Medicaid Other | Attending: Internal Medicine | Admitting: Internal Medicine

## 2017-07-15 DIAGNOSIS — I5043 Acute on chronic combined systolic (congestive) and diastolic (congestive) heart failure: Secondary | ICD-10-CM | POA: Diagnosis not present

## 2017-07-15 DIAGNOSIS — R0789 Other chest pain: Secondary | ICD-10-CM

## 2017-07-15 DIAGNOSIS — J9601 Acute respiratory failure with hypoxia: Secondary | ICD-10-CM | POA: Diagnosis not present

## 2017-07-15 DIAGNOSIS — E876 Hypokalemia: Secondary | ICD-10-CM | POA: Diagnosis not present

## 2017-07-15 DIAGNOSIS — I5041 Acute combined systolic (congestive) and diastolic (congestive) heart failure: Secondary | ICD-10-CM

## 2017-07-15 DIAGNOSIS — Z720 Tobacco use: Secondary | ICD-10-CM | POA: Insufficient documentation

## 2017-07-15 DIAGNOSIS — J449 Chronic obstructive pulmonary disease, unspecified: Secondary | ICD-10-CM

## 2017-07-15 DIAGNOSIS — I214 Non-ST elevation (NSTEMI) myocardial infarction: Secondary | ICD-10-CM | POA: Diagnosis not present

## 2017-07-15 DIAGNOSIS — R3915 Urgency of urination: Secondary | ICD-10-CM

## 2017-07-15 DIAGNOSIS — F419 Anxiety disorder, unspecified: Secondary | ICD-10-CM | POA: Diagnosis present

## 2017-07-15 DIAGNOSIS — Z9911 Dependence on respirator [ventilator] status: Secondary | ICD-10-CM

## 2017-07-15 DIAGNOSIS — I2129 ST elevation (STEMI) myocardial infarction involving other sites: Principal | ICD-10-CM | POA: Diagnosis present

## 2017-07-15 DIAGNOSIS — I21A1 Myocardial infarction type 2: Secondary | ICD-10-CM | POA: Insufficient documentation

## 2017-07-15 DIAGNOSIS — I420 Dilated cardiomyopathy: Secondary | ICD-10-CM | POA: Diagnosis not present

## 2017-07-15 DIAGNOSIS — Z8249 Family history of ischemic heart disease and other diseases of the circulatory system: Secondary | ICD-10-CM

## 2017-07-15 DIAGNOSIS — I513 Intracardiac thrombosis, not elsewhere classified: Secondary | ICD-10-CM

## 2017-07-15 DIAGNOSIS — R748 Abnormal levels of other serum enzymes: Secondary | ICD-10-CM

## 2017-07-15 DIAGNOSIS — J9600 Acute respiratory failure, unspecified whether with hypoxia or hypercapnia: Secondary | ICD-10-CM

## 2017-07-15 DIAGNOSIS — I5181 Takotsubo syndrome: Secondary | ICD-10-CM | POA: Diagnosis present

## 2017-07-15 DIAGNOSIS — F17210 Nicotine dependence, cigarettes, uncomplicated: Secondary | ICD-10-CM | POA: Diagnosis not present

## 2017-07-15 DIAGNOSIS — G9340 Encephalopathy, unspecified: Secondary | ICD-10-CM | POA: Diagnosis present

## 2017-07-15 DIAGNOSIS — J441 Chronic obstructive pulmonary disease with (acute) exacerbation: Secondary | ICD-10-CM

## 2017-07-15 DIAGNOSIS — I428 Other cardiomyopathies: Secondary | ICD-10-CM

## 2017-07-15 DIAGNOSIS — I110 Hypertensive heart disease with heart failure: Secondary | ICD-10-CM | POA: Diagnosis present

## 2017-07-15 DIAGNOSIS — Z791 Long term (current) use of non-steroidal anti-inflammatories (NSAID): Secondary | ICD-10-CM

## 2017-07-15 DIAGNOSIS — F1290 Cannabis use, unspecified, uncomplicated: Secondary | ICD-10-CM

## 2017-07-15 DIAGNOSIS — J440 Chronic obstructive pulmonary disease with acute lower respiratory infection: Secondary | ICD-10-CM | POA: Diagnosis present

## 2017-07-15 DIAGNOSIS — R0600 Dyspnea, unspecified: Secondary | ICD-10-CM

## 2017-07-15 DIAGNOSIS — R350 Frequency of micturition: Secondary | ICD-10-CM

## 2017-07-15 DIAGNOSIS — R079 Chest pain, unspecified: Secondary | ICD-10-CM

## 2017-07-15 HISTORY — PX: LEFT HEART CATH AND CORONARY ANGIOGRAPHY: CATH118249

## 2017-07-15 HISTORY — DX: Chronic obstructive pulmonary disease, unspecified: J44.9

## 2017-07-15 LAB — BASIC METABOLIC PANEL
ANION GAP: 7 (ref 5–15)
ANION GAP: 10 (ref 5–15)
BUN: 6 mg/dL (ref 6–20)
BUN: 9 mg/dL (ref 6–20)
CALCIUM: 9.6 mg/dL (ref 8.9–10.3)
CALCIUM: 9.1 mg/dL (ref 8.9–10.3)
CO2: 22 mmol/L (ref 22–32)
CO2: 20 mmol/L — ABNORMAL LOW (ref 22–32)
CREATININE: 0.74 mg/dL (ref 0.44–1.00)
Chloride: 111 mmol/L (ref 101–111)
Chloride: 113 mmol/L — ABNORMAL HIGH (ref 101–111)
Creatinine, Ser: 0.67 mg/dL (ref 0.44–1.00)
Glucose, Bld: 103 mg/dL — ABNORMAL HIGH (ref 65–99)
Glucose, Bld: 157 mg/dL — ABNORMAL HIGH (ref 65–99)
Potassium: 3.9 mmol/L (ref 3.5–5.1)
Potassium: 3.9 mmol/L (ref 3.5–5.1)
SODIUM: 140 mmol/L (ref 135–145)
SODIUM: 143 mmol/L (ref 135–145)

## 2017-07-15 LAB — TROPONIN I
TROPONIN I: 1.42 ng/mL — AB (ref <0.03)
Troponin I: 0.63 ng/mL (ref <0.03)

## 2017-07-15 LAB — POCT I-STAT 3, ART BLOOD GAS (G3+)
Acid-base deficit: 6 mmol/L — ABNORMAL HIGH (ref 0.0–2.0)
Acid-base deficit: 4 mmol/L — ABNORMAL HIGH (ref 0.0–2.0)
Bicarbonate: 24.3 mmol/L (ref 20.0–28.0)
Bicarbonate: 21.9 mmol/L (ref 20.0–28.0)
O2 SAT: 100 %
O2 SAT: 100 %
PCO2 ART: 68.6 mmHg — AB (ref 32.0–48.0)
PCO2 ART: 41.1 mmHg (ref 32.0–48.0)
PH ART: 7.332 — AB (ref 7.350–7.450)
PO2 ART: 441 mmHg — AB (ref 83.0–108.0)
PO2 ART: 262 mmHg — AB (ref 83.0–108.0)
Patient temperature: 98
Patient temperature: 97.6
TCO2: 26 mmol/L (ref 22–32)
TCO2: 23 mmol/L (ref 22–32)
pH, Arterial: 7.156 — CL (ref 7.350–7.450)

## 2017-07-15 LAB — CBC
HCT: 44.9 % (ref 36.0–46.0)
HCT: 43.4 % (ref 36.0–46.0)
HEMOGLOBIN: 15.2 g/dL — AB (ref 12.0–15.0)
Hemoglobin: 13.9 g/dL (ref 12.0–15.0)
MCH: 29 pg (ref 26.0–34.0)
MCH: 28 pg (ref 26.0–34.0)
MCHC: 33.9 g/dL (ref 30.0–36.0)
MCHC: 32 g/dL (ref 30.0–36.0)
MCV: 85.5 fL (ref 78.0–100.0)
MCV: 87.3 fL (ref 78.0–100.0)
Platelets: 345 10*3/uL (ref 150–400)
Platelets: 260 10*3/uL (ref 150–400)
RBC: 5.25 MIL/uL — AB (ref 3.87–5.11)
RBC: 4.97 MIL/uL (ref 3.87–5.11)
RDW: 14.2 % (ref 11.5–15.5)
RDW: 15 % (ref 11.5–15.5)
WBC: 9.1 10*3/uL (ref 4.0–10.5)
WBC: 12.8 10*3/uL — AB (ref 4.0–10.5)

## 2017-07-15 LAB — I-STAT TROPONIN, ED
TROPONIN I, POC: 0.4 ng/mL — AB (ref 0.00–0.08)
Troponin i, poc: 0.87 ng/mL (ref 0.00–0.08)

## 2017-07-15 LAB — HEMOGLOBIN A1C
HEMOGLOBIN A1C: 5.5 % (ref 4.8–5.6)
MEAN PLASMA GLUCOSE: 111.15 mg/dL

## 2017-07-15 LAB — POCT I-STAT TROPONIN I: TROPONIN I, POC: 1.46 ng/mL — AB (ref 0.00–0.08)

## 2017-07-15 LAB — PROTIME-INR
INR: 1.09
Prothrombin Time: 14 seconds (ref 11.4–15.2)

## 2017-07-15 LAB — I-STAT BETA HCG BLOOD, ED (MC, WL, AP ONLY): I-stat hCG, quantitative: <5 m[IU]/mL (ref <5)

## 2017-07-15 LAB — HEPARIN LEVEL (UNFRACTIONATED): Heparin Unfractionated: 0.23 IU/mL — ABNORMAL LOW (ref 0.30–0.70)

## 2017-07-15 LAB — ECHOCARDIOGRAM COMPLETE
Height: 69 in
Weight: 2080 oz

## 2017-07-15 LAB — TRIGLYCERIDES: Triglycerides: 53 mg/dL (ref <150)

## 2017-07-15 SURGERY — LEFT HEART CATH AND CORONARY ANGIOGRAPHY
Anesthesia: LOCAL

## 2017-07-15 MED ORDER — IOPAMIDOL (ISOVUE-370) INJECTION 76%
INTRAVENOUS | Status: DC | PRN
  Administered 2017-07-15: 30 mL via INTRAVENOUS

## 2017-07-15 MED ORDER — SODIUM CHLORIDE 0.9% FLUSH
3.0000 mL | Freq: Two times a day (BID) | INTRAVENOUS | Status: DC
  Administered 2017-07-15 – 2017-07-16 (×2): 3 mL via INTRAVENOUS

## 2017-07-15 MED ORDER — SODIUM CHLORIDE 0.9% FLUSH: 3.0000 mL | INTRAVENOUS | Status: DC | PRN

## 2017-07-15 MED ORDER — ACETAMINOPHEN 325 MG PO TABS: 650.0000 mg | ORAL_TABLET | Freq: Four times a day (QID) | ORAL | Status: DC | PRN

## 2017-07-15 MED ORDER — SODIUM CHLORIDE 0.9% FLUSH: 3.0000 mL | Freq: Two times a day (BID) | INTRAVENOUS | Status: DC

## 2017-07-15 MED ORDER — NITROGLYCERIN 2 % TD OINT: 1.0000 [in_us] | TOPICAL_OINTMENT | Freq: Four times a day (QID) | TRANSDERMAL | Status: DC

## 2017-07-15 MED ORDER — ALBUTEROL (5 MG/ML) CONTINUOUS INHALATION SOLN
10.0000 mg/h | INHALATION_SOLUTION | Freq: Once | RESPIRATORY_TRACT | Status: AC
  Administered 2017-07-15: 10 mg/h via RESPIRATORY_TRACT
  Filled 2017-07-15: qty 20

## 2017-07-15 MED ORDER — VERAPAMIL HCL 2.5 MG/ML IV SOLN
INTRAVENOUS | Status: AC
  Filled 2017-07-15: qty 2

## 2017-07-15 MED ORDER — PROPOFOL 1000 MG/100ML IV EMUL
0.0000 ug/kg/min | INTRAVENOUS | Status: DC
  Administered 2017-07-15: 50 ug/kg/min via INTRAVENOUS
  Administered 2017-07-15: 45 ug/kg/min via INTRAVENOUS
  Administered 2017-07-15: 50 ug/kg/min via INTRAVENOUS
  Administered 2017-07-16: 35 ug/kg/min via INTRAVENOUS
  Administered 2017-07-16: 50 ug/kg/min via INTRAVENOUS
  Filled 2017-07-15 (×2): qty 100

## 2017-07-15 MED ORDER — HEPARIN (PORCINE) IN NACL 100-0.45 UNIT/ML-% IJ SOLN
700.0000 [IU]/h | INTRAMUSCULAR | Status: DC
  Administered 2017-07-15: 700 [IU]/h via INTRAVENOUS
  Filled 2017-07-15: qty 250

## 2017-07-15 MED ORDER — SODIUM CHLORIDE 0.9 % IV SOLN
INTRAVENOUS | Status: DC
  Administered 2017-07-15: 17:00:00 via INTRAVENOUS

## 2017-07-15 MED ORDER — HEPARIN (PORCINE) IN NACL 2-0.9 UNIT/ML-% IJ SOLN
INTRAMUSCULAR | Status: AC
  Filled 2017-07-15: qty 1000

## 2017-07-15 MED ORDER — METHYLPREDNISOLONE SODIUM SUCC 125 MG IJ SOLR
125.0000 mg | Freq: Once | INTRAMUSCULAR | Status: AC
  Administered 2017-07-15: 125 mg via INTRAVENOUS
  Filled 2017-07-15: qty 2

## 2017-07-15 MED ORDER — ORAL CARE MOUTH RINSE
15.0000 mL | Freq: Four times a day (QID) | OROMUCOSAL | Status: DC
  Administered 2017-07-15 – 2017-07-16 (×3): 15 mL via OROMUCOSAL

## 2017-07-15 MED ORDER — ALBUTEROL SULFATE (2.5 MG/3ML) 0.083% IN NEBU
2.5000 mg | INHALATION_SOLUTION | RESPIRATORY_TRACT | Status: DC | PRN
Start: 2017-07-15 — End: 2017-07-19
  Administered 2017-07-15 – 2017-07-17 (×2): 2.5 mg via RESPIRATORY_TRACT
  Filled 2017-07-15 (×2): qty 3

## 2017-07-15 MED ORDER — ACETAMINOPHEN 650 MG RE SUPP: 650.0000 mg | Freq: Four times a day (QID) | RECTAL | Status: DC | PRN

## 2017-07-15 MED ORDER — DEXTROSE 5 % IV SOLN
500.0000 mg | INTRAVENOUS | Status: DC
  Administered 2017-07-15 – 2017-07-16 (×2): 500 mg via INTRAVENOUS
  Filled 2017-07-15 (×3): qty 500

## 2017-07-15 MED ORDER — METHYLPREDNISOLONE SODIUM SUCC 125 MG IJ SOLR
125.0000 mg | Freq: Three times a day (TID) | INTRAMUSCULAR | Status: DC
Start: 2017-07-15 — End: 2017-07-16
  Administered 2017-07-15 – 2017-07-16 (×3): 125 mg via INTRAVENOUS
  Filled 2017-07-15 (×3): qty 2

## 2017-07-15 MED ORDER — IOPAMIDOL (ISOVUE-370) INJECTION 76%
INTRAVENOUS | Status: AC
  Filled 2017-07-15: qty 100

## 2017-07-15 MED ORDER — MIDAZOLAM HCL 5 MG/5ML IJ SOLN
INTRAMUSCULAR | Status: AC | PRN
  Administered 2017-07-15: 2 mg via INTRAVENOUS

## 2017-07-15 MED ORDER — NITROGLYCERIN IN D5W 200-5 MCG/ML-% IV SOLN
0.0000 ug/min | INTRAVENOUS | Status: DC
  Filled 2017-07-15: qty 250

## 2017-07-15 MED ORDER — NITROGLYCERIN 2 % TD OINT
1.0000 [in_us] | TOPICAL_OINTMENT | Freq: Four times a day (QID) | TRANSDERMAL | Status: DC
  Administered 2017-07-15 – 2017-07-16 (×4): 1 [in_us] via TOPICAL
  Filled 2017-07-15: qty 30
  Filled 2017-07-15: qty 1

## 2017-07-15 MED ORDER — LIDOCAINE HCL (PF) 1 % IJ SOLN
INTRAMUSCULAR | Status: DC | PRN
  Administered 2017-07-15: 1 mL

## 2017-07-15 MED ORDER — ASPIRIN 81 MG PO CHEW: 81.0000 mg | CHEWABLE_TABLET | ORAL | Status: DC

## 2017-07-15 MED ORDER — ENOXAPARIN SODIUM 40 MG/0.4ML ~~LOC~~ SOLN
40.0000 mg | SUBCUTANEOUS | Status: DC
  Administered 2017-07-16 – 2017-07-19 (×4): 40 mg via SUBCUTANEOUS
  Filled 2017-07-15 (×4): qty 0.4

## 2017-07-15 MED ORDER — PROPOFOL 1000 MG/100ML IV EMUL
INTRAVENOUS | Status: AC
  Filled 2017-07-15: qty 100

## 2017-07-15 MED ORDER — FENTANYL CITRATE (PF) 100 MCG/2ML IJ SOLN
INTRAMUSCULAR | Status: AC
Start: 2017-07-15 — End: 2017-07-16
  Filled 2017-07-15: qty 2

## 2017-07-15 MED ORDER — ACETAMINOPHEN 325 MG PO TABS
650.0000 mg | ORAL_TABLET | ORAL | Status: DC | PRN
  Administered 2017-07-17: 650 mg via ORAL
  Filled 2017-07-15: qty 2

## 2017-07-15 MED ORDER — FENTANYL CITRATE (PF) 100 MCG/2ML IJ SOLN
INTRAMUSCULAR | Status: AC | PRN
  Administered 2017-07-15: 100 ug via INTRAVENOUS

## 2017-07-15 MED ORDER — CHLORHEXIDINE GLUCONATE 0.12% ORAL RINSE (MEDLINE KIT)
15.0000 mL | Freq: Two times a day (BID) | OROMUCOSAL | Status: DC
  Administered 2017-07-15 – 2017-07-16 (×2): 15 mL via OROMUCOSAL

## 2017-07-15 MED ORDER — ONDANSETRON HCL 4 MG/2ML IJ SOLN: 4.0000 mg | Freq: Four times a day (QID) | INTRAMUSCULAR | Status: DC | PRN

## 2017-07-15 MED ORDER — FENTANYL CITRATE (PF) 100 MCG/2ML IJ SOLN: 100.0000 ug | INTRAMUSCULAR | Status: DC | PRN

## 2017-07-15 MED ORDER — ETOMIDATE 2 MG/ML IV SOLN
INTRAVENOUS | Status: AC | PRN
  Administered 2017-07-15: 20 mg via INTRAVENOUS

## 2017-07-15 MED ORDER — SODIUM CHLORIDE 0.9 % IV SOLN: 250.0000 mL | INTRAVENOUS | Status: DC | PRN

## 2017-07-15 MED ORDER — FENTANYL 2500MCG IN NS 250ML (10MCG/ML) PREMIX INFUSION
25.0000 ug/h | INTRAVENOUS | Status: DC
  Administered 2017-07-15: 25 ug/h via INTRAVENOUS
  Administered 2017-07-16: 100 ug/h via INTRAVENOUS
  Filled 2017-07-15 (×2): qty 250

## 2017-07-15 MED ORDER — HEPARIN SODIUM (PORCINE) 1000 UNIT/ML IJ SOLN
INTRAMUSCULAR | Status: AC
  Filled 2017-07-15: qty 1

## 2017-07-15 MED ORDER — MAGNESIUM SULFATE 2 GM/50ML IV SOLN
2.0000 g | Freq: Once | INTRAVENOUS | Status: AC
  Administered 2017-07-15: 2 g via INTRAVENOUS
  Filled 2017-07-15: qty 50

## 2017-07-15 MED ORDER — FENTANYL BOLUS VIA INFUSION
50.0000 ug | INTRAVENOUS | Status: DC | PRN
  Administered 2017-07-16: 50 ug via INTRAVENOUS
  Filled 2017-07-15: qty 50

## 2017-07-15 MED ORDER — ASPIRIN 81 MG PO CHEW
324.0000 mg | CHEWABLE_TABLET | Freq: Once | ORAL | Status: AC
  Administered 2017-07-15: 324 mg via ORAL
  Filled 2017-07-15: qty 4

## 2017-07-15 MED ORDER — IPRATROPIUM-ALBUTEROL 0.5-2.5 (3) MG/3ML IN SOLN
3.0000 mL | Freq: Four times a day (QID) | RESPIRATORY_TRACT | Status: DC
  Administered 2017-07-15 – 2017-07-17 (×8): 3 mL via RESPIRATORY_TRACT
  Filled 2017-07-15 (×9): qty 3

## 2017-07-15 MED ORDER — ALBUTEROL SULFATE (2.5 MG/3ML) 0.083% IN NEBU
INHALATION_SOLUTION | RESPIRATORY_TRACT | Status: AC
  Filled 2017-07-15: qty 6

## 2017-07-15 MED ORDER — NITROGLYCERIN 0.4 MG SL SUBL
0.4000 mg | SUBLINGUAL_TABLET | SUBLINGUAL | Status: DC | PRN
  Administered 2017-07-15: 0.4 mg via SUBLINGUAL
  Filled 2017-07-15: qty 1

## 2017-07-15 MED ORDER — ALBUTEROL (5 MG/ML) CONTINUOUS INHALATION SOLN
10.0000 mg/h | INHALATION_SOLUTION | Freq: Once | RESPIRATORY_TRACT | Status: AC
  Administered 2017-07-15: 10 mg/h via RESPIRATORY_TRACT

## 2017-07-15 MED ORDER — MIDAZOLAM HCL 2 MG/2ML IJ SOLN
1.0000 mg | INTRAMUSCULAR | Status: DC | PRN
  Administered 2017-07-15: 2 mg via INTRAVENOUS
  Administered 2017-07-15: 4 mg via INTRAVENOUS
  Administered 2017-07-16 (×2): 2 mg via INTRAVENOUS
  Filled 2017-07-15: qty 2
  Filled 2017-07-15: qty 4
  Filled 2017-07-15: qty 2
  Filled 2017-07-15: qty 4

## 2017-07-15 MED ORDER — ENOXAPARIN SODIUM 40 MG/0.4ML ~~LOC~~ SOLN: 40.0000 mg | SUBCUTANEOUS | Status: DC

## 2017-07-15 MED ORDER — FENTANYL CITRATE (PF) 100 MCG/2ML IJ SOLN
INTRAMUSCULAR | Status: AC
  Filled 2017-07-15: qty 2

## 2017-07-15 MED ORDER — SODIUM CHLORIDE 0.9% FLUSH
3.0000 mL | Freq: Two times a day (BID) | INTRAVENOUS | Status: DC
  Administered 2017-07-15 – 2017-07-19 (×5): 3 mL via INTRAVENOUS

## 2017-07-15 MED ORDER — MIDAZOLAM HCL 2 MG/2ML IJ SOLN
INTRAMUSCULAR | Status: AC
  Filled 2017-07-15: qty 4

## 2017-07-15 MED ORDER — ROCURONIUM BROMIDE 50 MG/5ML IV SOLN
INTRAVENOUS | Status: AC | PRN
  Administered 2017-07-15: 50 mg via INTRAVENOUS

## 2017-07-15 MED ORDER — METOPROLOL TARTRATE 5 MG/5ML IV SOLN
5.0000 mg | Freq: Once | INTRAVENOUS | Status: DC
  Filled 2017-07-15: qty 5

## 2017-07-15 MED ORDER — HEPARIN (PORCINE) IN NACL 2-0.9 UNIT/ML-% IJ SOLN
INTRAMUSCULAR | Status: AC | PRN
  Administered 2017-07-15: 1000 mL

## 2017-07-15 MED ORDER — FENTANYL CITRATE (PF) 100 MCG/2ML IJ SOLN
INTRAMUSCULAR | Status: DC | PRN
  Administered 2017-07-15: 50 ug via INTRAVENOUS

## 2017-07-15 MED ORDER — PANTOPRAZOLE SODIUM 40 MG IV SOLR
40.0000 mg | Freq: Every day | INTRAVENOUS | Status: DC
  Administered 2017-07-15 – 2017-07-16 (×2): 40 mg via INTRAVENOUS
  Filled 2017-07-15 (×2): qty 40

## 2017-07-15 MED ORDER — SODIUM CHLORIDE 0.9 % IV BOLUS (SEPSIS)
1000.0000 mL | Freq: Once | INTRAVENOUS | Status: AC
  Administered 2017-07-15: 1000 mL via INTRAVENOUS

## 2017-07-15 MED ORDER — HEPARIN BOLUS VIA INFUSION
3000.0000 [IU] | Freq: Once | INTRAVENOUS | Status: AC
  Administered 2017-07-15: 3000 [IU] via INTRAVENOUS
  Filled 2017-07-15: qty 3000

## 2017-07-15 MED ORDER — FENTANYL CITRATE (PF) 100 MCG/2ML IJ SOLN
100.0000 ug | INTRAMUSCULAR | Status: DC | PRN
  Filled 2017-07-15: qty 2

## 2017-07-15 MED ORDER — FENTANYL CITRATE (PF) 100 MCG/2ML IJ SOLN
25.0000 ug | INTRAMUSCULAR | Status: DC | PRN
  Administered 2017-07-15 (×2): 100 ug via INTRAVENOUS
  Administered 2017-07-15 (×2): 50 ug via INTRAVENOUS
  Filled 2017-07-15 (×2): qty 2

## 2017-07-15 MED ORDER — ETOMIDATE 2 MG/ML IV SOLN
INTRAVENOUS | Status: AC
  Filled 2017-07-15: qty 10

## 2017-07-15 MED ORDER — ASPIRIN EC 81 MG PO TBEC: 81.0000 mg | DELAYED_RELEASE_TABLET | Freq: Every day | ORAL | Status: DC

## 2017-07-15 MED ORDER — LIDOCAINE HCL (PF) 1 % IJ SOLN
INTRAMUSCULAR | Status: AC
  Filled 2017-07-15: qty 30

## 2017-07-15 MED ORDER — VERAPAMIL HCL 2.5 MG/ML IV SOLN
INTRAVENOUS | Status: DC | PRN
  Administered 2017-07-15: 3 mL via INTRA_ARTERIAL

## 2017-07-15 MED ORDER — FENTANYL CITRATE (PF) 100 MCG/2ML IJ SOLN: 50.0000 ug | Freq: Once | INTRAMUSCULAR | Status: DC

## 2017-07-15 MED ORDER — PREDNISONE 20 MG PO TABS: 40.0000 mg | ORAL_TABLET | Freq: Every day | ORAL | Status: DC

## 2017-07-15 MED ORDER — PROPOFOL 1000 MG/100ML IV EMUL
INTRAVENOUS | Status: AC | PRN
  Administered 2017-07-15: 56.883 ug/kg/min via INTRAVENOUS

## 2017-07-15 MED ORDER — NITROGLYCERIN IN D5W 200-5 MCG/ML-% IV SOLN
INTRAVENOUS | Status: AC
  Filled 2017-07-15: qty 250

## 2017-07-15 MED ORDER — SODIUM CHLORIDE 0.9 % IV SOLN
INTRAVENOUS | Status: DC
  Administered 2017-07-15 – 2017-07-16 (×2): via INTRAVENOUS

## 2017-07-15 MED ORDER — HEPARIN SODIUM (PORCINE) 1000 UNIT/ML IJ SOLN
INTRAMUSCULAR | Status: DC | PRN
  Administered 2017-07-15: 3000 [IU] via INTRAVENOUS

## 2017-07-15 MED ORDER — IPRATROPIUM BROMIDE 0.02 % IN SOLN
0.5000 mg | Freq: Once | RESPIRATORY_TRACT | Status: AC
  Administered 2017-07-15: 0.5 mg via RESPIRATORY_TRACT
  Filled 2017-07-15: qty 2.5

## 2017-07-15 SURGICAL SUPPLY — 9 items

## 2017-07-15 NOTE — ED Notes (Signed)
ED Provider at bedside. 

## 2017-07-15 NOTE — ED Notes (Signed)
pts work of breathing lessened after neb treatment started, resp therapist called for second continuous neb tx per PA Harris' verbal order.

## 2017-07-15 NOTE — Progress Notes (Signed)
[facility] Progress Note Patient Name: Caitlin [name] DOB: [birth year] MRN: [id number]   Date of Service  07/15/2017  HPI/Events of Note  Patient's portable CXR reviewed by me.  Endotracheal tube too high.  eICU Interventions  1. Order placed to advance ETT 2cm  2. Repeat portable chest x-ray in a.m.      Intervention Category Major Interventions: Respiratory failure - evaluation and management  Tera Partridge 07/15/2017, 8:12 PM

## 2017-07-15 NOTE — H&P (View-Only) (Signed)
Cardiology Consultation:   [name] ID: Caitlin [name]; [id number]; 07-10-1966   Admit date: 07/15/2017 Date of Consult: 07/15/2017  Primary Care Provider: System, Pcp Not In Primary Cardiologist: New    [name] is a 51 y.o. female with a hx of COPD and tobacco use who is being seen today for Caitlin evaluation of NSTEMI at Caitlin request of DR. Klima.  History of Present Illness:   Caitlin [name] presented to Caitlin [facility] ED for evaluation of a one week progression of shortness of breath. She says that over Caitlin last 48 hours she has had shortness of breath and chest tightness with exertion chest become significantly worse over Caitlin last 24 hours. She becomes short of breath with chest tightness with walking only a few feet across Caitlin room. This improves after several minutes of rest. She has never had chest pain before and denies any previous cardiac history. She has difficulty lying flat due to dyspnea. She has no pedal edema, palpitations or dizziness. She's not been diagnosed with hypertension or diabetes or hyperlipidemia. She was diagnosed with COPD about a year ago and uses an inhaler for this. Her inhaler has not been helping over Caitlin last few days. Prior to this episode she was working full-time as a Quarry manager and was able to be quite active with no exertional symptoms. She has 2 children.  Caitlin [name] is a smoker half pack per day since age 37. She has only rare alcohol. Her mother had some type of heart issue first occurring in her mid to late 50s but Caitlin [name] has not aware of Caitlin details. Her father died of a brain aneurysm and she does not know any of his other medical history. She has 5 brothers and 5 sisters who are all healthy with no cardiac issues.  Significant findings: Troponins 0.40, 0.87 EKG Sinus arrhythmia with anterior Q waves, progressive T wave flattening from initial EKG to subsequent EKG Chest x-ray shows COPD, stable exam SCr 0.67,   K+ 3.9,   Hgb  15.2  Past Medical History:  Diagnosis Date  . COPD (chronic obstructive pulmonary disease) ([place])     History reviewed. No pertinent surgical history.   Home Medications:  Prior to Admission medications   Medication Sig Start Date End Date Taking? Authorizing Provider  Biotin 1 MG CAPS Take 1 mg by mouth daily.   Yes [provider]  ibuprofen (ADVIL,MOTRIN) 600 MG tablet Take 1 tablet (600 mg total) by mouth every 6 (six) hours as needed. [name] taking differently: Take 600 mg by mouth every 6 (six) hours as needed for moderate pain.  09/09/16  Yes Melynda Ripple, MD  vitamin B-12 (CYANOCOBALAMIN) 100 MCG tablet Take 100 mcg by mouth daily.   Yes [provider]    Inpatient Medications: Scheduled Meds: . albuterol      . [START ON 07/16/2017] aspirin EC  81 mg Oral Daily  . ipratropium-albuterol  3 mL Nebulization Q6H  . metoprolol tartrate  5 mg Intravenous Once  . [START ON 07/16/2017] predniSONE  40 mg Oral Q breakfast  . sodium chloride flush  3 mL Intravenous Q12H   Continuous Infusions: . nitroGLYCERIN    . sodium chloride 125 mL/hr at 07/15/17 0831  . azithromycin 500 mg (07/15/17 0937)  . heparin 700 Units/hr (07/15/17 1001)   PRN Meds: acetaminophen **OR** acetaminophen, albuterol, nitroGLYCERIN  Allergies:   No Known Allergies  Social History:   Social History  Social History  . Marital status: Divorced    Spouse name: N/A  . Number of children: N/A  . Years of education: N/A   Occupational History  . Not on file.   Social History Main Topics  . Smoking status: Current Every Day Smoker    Packs/day: 1.00    Types: Cigarettes  . Smokeless tobacco: Never Used  . Alcohol use Yes     Comment: occasional alcohol  . Drug use: Yes    Types: Marijuana     Comment: Occasional marijuana  . Sexual activity: Not on file   Other Topics Concern  . Not on file   Social History Narrative  . No narrative on file    Family History:      Family History  Problem Relation Age of Onset  . Heart disease Mother        age 9's  . Hypertension Mother   . Aneurysm Father        brain     ROS:  Please see Caitlin history of present illness.  ROS  All other ROS reviewed and negative.     Physical Exam/Data:   Vitals:   07/15/17 0915 07/15/17 0937 07/15/17 0945 07/15/17 1000  BP:  134/85 (!) 143/107   Pulse: (!) 103 98 93 (!) 103  Resp: 17 17 16 19   Temp:      TempSrc:      SpO2: 96% 98% 99% 96%  Weight:      Height:       No intake or output data in Caitlin 24 hours ending 07/15/17 1021 Filed Weights   07/15/17 0432  Weight: 130 lb (59 kg)   Body mass index is 19.2 kg/m.  General:  Well nourished, well developed, Appears short of breath having just returned from Caitlin bathroom HEENT: normal Lymph: no adenopathy Neck: no JVD Vascular:  2+ pedal pulses, 2+ femoral pulses Cardiac:  normal S1, S2; RRR; no murmur  Lungs:  Diffuse coarse expiratory wheezes and musical inspiratory wheezes Abd: soft, nontender, no hepatomegaly  Ext: no edema Musculoskeletal:  No deformities, BUE and BLE strength normal and equal Skin: warm and dry  Neuro:  CNs 2-12 intact, no focal abnormalities noted Psych:  Normal affect   EKG:  Caitlin EKG was personally reviewed and demonstrates:  Sinus arrhythmia with anterior Q waves, progressive T wave flattening from initial EKG to subsequent EKG  Telemetry:  Telemetry was personally reviewed and demonstrates:  Sinus rhythm/sinus tachycardia with rates in Caitlin 80s to low 100s  Relevant CV Studies:  None  Laboratory Data:  Chemistry  Recent Labs Lab 07/15/17 0434  NA 140  K 3.9  CL 111  CO2 22  GLUCOSE 103*  BUN 6  CREATININE 0.67  CALCIUM 9.6  GFRNONAA >60  GFRAA >60  ANIONGAP 7    No results for input(s): PROT, ALBUMIN, AST, ALT, ALKPHOS, BILITOT in Caitlin last 168 hours. Hematology  Recent Labs Lab 07/15/17 0434  WBC 9.1  RBC 5.25*  HGB 15.2*  HCT 44.9  MCV 85.5  MCH  29.0  MCHC 33.9  RDW 14.2  PLT 345   Cardiac EnzymesNo results for input(s): TROPONINI in Caitlin last 168 hours.   Recent Labs Lab 07/15/17 0523 07/15/17 0933  TROPIPOC 0.40* 0.87*    BNPNo results for input(s): BNP, PROBNP in Caitlin last 168 hours.  DDimer No results for input(s): DDIMER in Caitlin last 168 hours.  Radiology/Studies:  Dg Chest 2 View  Result Date:  07/15/2017 CLINICAL DATA:  Shortness breath for 4 days, chest tightness for 1 hour. History of COPD. EXAM: CHEST  2 VIEW COMPARISON:  Chest radiographs April 22, 2012 FINDINGS: Cardiomediastinal silhouette is normal. Mildly calcified aortic knob. He No pleural effusions or focal consolidations. Mild chronic interstitial changes and increased lung volumes. Trachea projects midline and there is no pneumothorax. Soft tissue planes and included osseous structures are non-suspicious. IMPRESSION: Stable examination:  COPD. Electronically Signed   By: Elon Alas M.D.   On: 07/15/2017 05:31    Assessment and Plan:   NSTEMI -[name] with 48 hours of chest tightness and dyspnea, significantly worse in Caitlin last 24 hours. -Lungs with diffuse coarse wheezing. [name] has a history of COPD treated with inhalers over Caitlin last 1 year. She has a history of smoking half pack per day since age 39. -Troponins 0.40, 0.87 -Chest x-ray shows only COPD -EKG has progressive T wave flattening and anterior Q waves -Caitlin [name] is tachycardic after receiving bronchodilator and prednisone -CVD risk factors include smoking. Caitlin [name] is hypertensive here, has not been diagnosed prior to this. Caitlin [name] is not aware of her lipid status. -This picture may represent myocardial ischemia, however, with her diffuse wheezing and difficulty breathing this may represent demand ischemia in Caitlin setting of COPD exacerbation -Continue to trend troponins and check stat echocardiogram for LV function and wall motion.  Hypertension -Not previously diagnosed -Blood  pressures elevated in Caitlin ED -Monitor and treat as needed  COPD exacerbation -[name] with dyspnea and diffuse wheezing and orthopnea -Currently being treated with nebulizer, steroids and AZithromax  Cigarette smoking -[name] smokes half pack per day since age 43 -Advise smoking cessation, especially in setting of COPD  Signed, Daune Perch, NP  07/15/2017 10:21 AM  As above, [name] seen and examined. Briefly she is a 51 year old female with past medical history of COPD, tobacco abuse for evaluation of chest tightness and elevated troponin. [name] states she was diagnosed with COPD approximately 1 year ago. Over Caitlin past week she has noticed progressive increased dyspnea on exertion. She also has chest tightness with her dyspnea that increases with inspiration and lying flat. She denies fevers or chills but she has had a cough productive of yellow sputum. She does not have pedal edema, hemoptysis and prior to Caitlin past week denies dyspnea on exertion or exertional chest pain. She has been admitted and is presently being treated for COPD flare. Troponin elevated and cardiology asked to evaluate. Physical exam shows diminished breath sounds throughout and diffuse expiratory wheezing. Chest x-ray shows COPD. Troponin I is 0.40, 0.63 and 0.87. Hemoglobin 15.2. Electrocardiogram shows sinus rhythm, right atrial enlargement, septal infarct, inferior infarct, nonspecific ST changes.   1 chest tightness/elevated troponin-[name]'s presentation seems most consistent with COPD flare. She has dyspnea on exertion, chest tightness that increases with inspiration and productive cough. Her troponin is mildly elevated and her electrocardiogram cannot exclude prior septal and inferior infarct. Would continue to cycle enzymes. If there is a marked increase and she will need cardiac catheterization. We will also check echocardiogram for LV function and to rule out wall motion abnormality. Further management based  on these results. Would continue aspirin and heparin for now. I would avoid beta blockade given severity of lung disease and wheezing.  2 COPD/URI-I agree with continuing steroids, bronchodilators and antibiotics. Management per primary care.  3 tobacco abuse-[name] counseled on discontinuing.  4 hypertension-blood pressure is borderline. We will follow and add additional medications as needed.  Kirk Ruths, MD  Addendum: Echo personally reviewed; pt with anteroapical akinesis with moderate to severe LV dysfunction; troponin mildly elevated; ECG with septal MI. 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 However, I am concerned about acute infarct as well. Pt severely dyspneic. I will ask pulmonary to evaluate now as pulmonary status needs to be stabilized. I would then like to proceed with emergent cath to exclude LAD infarct; risks and benefits including MI, CVA and death discussed and pt agrees to proceed. Will add ARB later; will hold on beta blocker given severity of pulmonary disease and wheezing.  Kirk Ruths, MD

## 2017-07-15 NOTE — ED Notes (Signed)
Dr. Wilson Singer made aware of patients returned cp, advised to start nitro at this time

## 2017-07-15 NOTE — ED Provider Notes (Signed)
[facility] DEPT Provider Note   CSN: [id number] Arrival date & time: 07/15/17  0427     History   Chief Complaint Chief Complaint  Patient presents with  . Chest Pain  . Shortness of Breath    HPI Caitlin [name] is a 51 y.o. female with a pmh of COPD. She is a daily smoker. She does not have a current primary care physician or albuterol at home. Patient states that over the past week she has had worsening wheezing and difficulty breathing. She states that it is more difficult to sleep at night especially when lying flat. She feels better when she leans forward or sits upright. She states that last night her breathing became extremely labored with severe wheezing and tightness. She was unable to sleep. Around 3 in the morning she developed central retrosternal 5 out of 10 chest pain. She states that she has increased pressure and tightness on her chest.n she denies radiation of her pain, diaphoresis, nausea. She has no known coronary artery disease history.  HPI  Past Medical History:  Diagnosis Date  . COPD (chronic obstructive pulmonary disease) ([facility])     There are no active problems to display for this patient.   History reviewed. No pertinent surgical history.  OB History    No data available       Home Medications    Prior to Admission medications   Medication Sig Start Date End Date Taking? Authorizing Provider  ibuprofen (ADVIL,MOTRIN) 600 MG tablet Take 1 tablet (600 mg total) by mouth every 6 (six) hours as needed. 09/09/16   Melynda Ripple, MD    Family History No family history on file.  Social History Social History  Substance Use Topics  . Smoking status: Current Every Day Smoker    Packs/day: 1.00    Types: Cigarettes  . Smokeless tobacco: Never Used  . Alcohol use No     Allergies   Patient has no known allergies.   Review of Systems Review of Systems  Ten systems reviewed and are negative for acute change, except as noted in the  HPI.   Physical Exam Updated Vital Signs BP (!) 140/96   Pulse 82   Temp 98 F (36.7 C) (Oral)   Resp 15   Ht 5\' 9"  (1.753 m)   Wt 59 kg (130 lb)   LMP 09/09/2014 (Approximate)   SpO2 96%   BMI 19.20 kg/m   Physical Exam  Constitutional: She is oriented to person, place, and time. She appears well-developed and well-nourished.  HENT:  Head: Normocephalic and atraumatic.  Eyes: Pupils are equal, round, and reactive to light. EOM are normal.  Neck: Normal range of motion. Neck supple. No JVD present.  Cardiovascular: Regular rhythm and intact distal pulses.  Exam reveals no gallop and no friction rub.   No murmur heard. Tachycardic No peripheral edema  Pulmonary/Chest: No respiratory distress. She has wheezes.  Labored breathing, wheezing throughout all of her lung fields. Accessory muscle use  Abdominal: Soft. She exhibits distension. There is no tenderness.  Musculoskeletal: She exhibits edema.  Neurological: She is alert and oriented to person, place, and time.  Skin: Capillary refill takes less than 2 seconds.  Psychiatric: She has a normal mood and affect. Her behavior is normal.  Nursing note and vitals reviewed.    ED Treatments / Results  Labs (all labs ordered are listed, but only abnormal results are displayed) Labs Reviewed  BASIC METABOLIC PANEL - Abnormal; Notable for the following:  Result Value   Glucose, Bld 103 (*)    All other components within normal limits  CBC - Abnormal; Notable for the following:    RBC 5.25 (*)    Hemoglobin 15.2 (*)    All other components within normal limits  I-STAT TROPONIN, ED - Abnormal; Notable for the following:    Troponin i, poc 0.40 (*)    All other components within normal limits  HEPARIN LEVEL (UNFRACTIONATED)  PROTIME-INR  I-STAT BETA HCG BLOOD, ED ([facility], [facility], [facility] ONLY)  CBG MONITORING, ED  I-STAT TROPONIN, ED  I-STAT BETA HCG BLOOD, ED ([facility], [facility], [facility] ONLY)    EKG  EKG  Interpretation  Date/Time:  Thursday July 15 2017 06:51:00 EDT Ventricular Rate:  95 PR Interval:    QRS Duration: 75 QT Interval:  353 QTC Calculation: 444 R Axis:   -80 Text Interpretation:  Sinus arrhythmia Right atrial enlargement Anterior infarct, old diffuse t wave flattening as compared to prior earlier today Confirmed by Virgel Manifold [phone]) on 07/15/2017 7:31:27 AM       Radiology Dg Chest 2 View  Result Date: 07/15/2017 CLINICAL DATA:  Shortness breath for 4 days, chest tightness for 1 hour. History of COPD. EXAM: CHEST  2 VIEW COMPARISON:  Chest radiographs April 22, 2012 FINDINGS: Cardiomediastinal silhouette is normal. Mildly calcified aortic knob. He No pleural effusions or focal consolidations. Mild chronic interstitial changes and increased lung volumes. Trachea projects midline and there is no pneumothorax. Soft tissue planes and included osseous structures are non-suspicious. IMPRESSION: Stable examination:  COPD. Electronically Signed   By: Elon Alas M.D.   On: 07/15/2017 05:31    Procedures .Critical Care Performed by: Margarita Mail Authorized by: Margarita Mail   Critical care provider statement:    Critical care time (minutes):  70   Critical care was necessary to treat or prevent imminent or life-threatening deterioration of the following conditions:  Respiratory failure and cardiac failure (nstemi, copd exacerbation)   Critical care was time spent personally by me on the following activities:  Development of treatment plan with patient or surrogate, discussions with consultants, evaluation of patient's response to treatment, examination of patient, interpretation of cardiac output measurements, obtaining history from patient or surrogate, review of old charts, re-evaluation of patient's condition, pulse oximetry, ordering and review of radiographic studies, ordering and review of laboratory studies and ordering and performing treatments and  interventions    (including critical care time)  Medications Ordered in ED Medications  albuterol (PROVENTIL) (2.5 MG/3ML) 0.083% nebulizer solution (not administered)  nitroGLYCERIN (NITROSTAT) SL tablet 0.4 mg (0.4 mg Sublingual Given 07/15/17 0743)  sodium chloride 0.9 % bolus 1,000 mL (1,000 mLs Intravenous New Bag/Given 07/15/17 0727)    And  0.9 %  sodium chloride infusion (not administered)  magnesium sulfate IVPB 2 g 50 mL (2 g Intravenous New Bag/Given 07/15/17 0727)  albuterol (PROVENTIL,VENTOLIN) solution continuous neb (10 mg/hr Nebulization Given 07/15/17 0809)  heparin ADULT infusion 100 units/mL (25000 units/224mL sodium chloride 0.45%) (700 Units/hr Intravenous New Bag/Given 07/15/17 0749)  nitroGLYCERIN 50 mg in dextrose 5 % 250 mL (0.2 mg/mL) infusion (not administered)  metoprolol tartrate (LOPRESSOR) injection 5 mg (not administered)  aspirin chewable tablet 324 mg (324 mg Oral Given 07/15/17 0730)  methylPREDNISolone sodium succinate (SOLU-MEDROL) 125 mg/2 mL injection 125 mg (125 mg Intravenous Given 07/15/17 0728)  ipratropium (ATROVENT) nebulizer solution 0.5 mg (0.5 mg Nebulization Given 07/15/17 0810)  heparin bolus via infusion 3,000 Units (3,000 Units Intravenous Bolus from  Bag 07/15/17 0750)     Initial Impression / Assessment and Plan / ED Course  I have reviewed the triage vital signs and the nursing notes.  Pertinent labs & imaging results that were available during my care of the patient were reviewed by me and considered in my medical decision making (see chart for details).  Clinical Course as of Jul 15 816  Thu Jul 15, 2017  6269 Patient CP free after a single nitroglycerine.   [AH]    Clinical Course User Index [AH] Margarita Mail, PA-C    Patient with dynamic EKG changes, elevated troponin. She is also having a COPD exacerbation. We are treating her as a non-ST elevation MI although this may represent demand ischemia. I'm concerned for some  component of failure given her acknowledgment of orthopnea however there is no evidence of heart failure on her chest x-ray and she does not clinically appear to be volume overloaded in any way. The patient has been given metoprolol, started on an hour-long nebulizer treatment. Magnesium, nitroglycerin sublingual. She is also given nitro drip and aspirin. Patient will require admission.  Final Clinical Impressions(s) / ED Diagnoses   Final diagnoses:  NSTEMI (non-ST elevated myocardial infarction) [facility])  COPD with acute exacerbation [facility])    New Prescriptions New Prescriptions   No medications on file     Margarita Mail, PA-C 07/15/17 1704    Virgel Manifold, MD 07/26/17 1149

## 2017-07-15 NOTE — Progress Notes (Signed)
Spoke w/ Dr Ashok Cordia re: ABG results.  Per MD, change set rate to 26 for now.  Noted VT order changed to 8cc/kg= done.

## 2017-07-15 NOTE — ED Notes (Signed)
Patient taken to CATH lab 1 on a ventilator by this RN and RT.  Patient transferred over to cath table with no difficulties.  Belongings bag consisting of clothing, glasses, and a fan found on bedside table taken with patient and given to cath lab staff.

## 2017-07-15 NOTE — ED Notes (Signed)
Respiratory aware of need for continuous neb

## 2017-07-15 NOTE — ED Notes (Signed)
Consent signed, 324mg  asa given this am

## 2017-07-15 NOTE — Procedures (Signed)
Pt transported to cath lab on vent, no complications.

## 2017-07-15 NOTE — ED Notes (Signed)
Critical care at bedside, dr Stanford Breed and critical care agree patient needs to be intubated in order to be able to lie flat for cath. Dr. Elsworth Soho requesting pt to be moved to trauma room if possible to be intubated by prior to cath

## 2017-07-15 NOTE — ED Notes (Addendum)
Dr Stanford Breed requested pt have another istat troponin drawn at this time

## 2017-07-15 NOTE — ED Notes (Signed)
DR. Posey Pronto made aware of patient's returned chest pain and possible need for upgrade to SDU. Dr. Posey Pronto advised he would change patient's order for SDU and order nitro paste for patient.

## 2017-07-15 NOTE — ED Notes (Signed)
Aaron Edelman in cath lab advised that they will not be able to cath patient until around 1500-1530, Critical care made aware and advised this RN to let them know closer to time of patient's cath and they will come back down to ED and intubate patient prior to her cath.

## 2017-07-15 NOTE — ED Notes (Signed)
Dr Elsworth Soho contacted this RN regarding cath lab being ready for patient, MD made aware that cath lab has not called to state they are ready. Dr. Elsworth Soho requesting that patient be trialed lying flat on her back to see if she can tolerate supine position.

## 2017-07-15 NOTE — Progress Notes (Signed)
[place] for heparin Indication: chest pain/ACS  No Known Allergies  Patient Measurements: Height: 5\' 9"  (175.3 cm) Weight: 130 lb (59 kg) IBW/kg (Calculated) : 66.2  Vital Signs: Temp: 98 F (36.7 C) (08/30 0432) Temp Source: Oral (08/30 0432) BP: 129/101 (08/30 1537) Pulse Rate: 147 (08/30 1537)  Labs:  Recent Labs  07/15/17 0434 07/15/17 0913 07/15/17 0918 07/15/17 1441  HGB 15.2*  --   --   --   HCT 44.9  --   --   --   PLT 345  --   --   --   LABPROT  --   --  14.0  --   INR  --   --  1.09  --   HEPARINUNFRC  --   --   --  0.23*  CREATININE 0.67  --   --   --   TROPONINI  --  0.63*  --   --     Estimated Creatinine Clearance: 75.7 mL/min (by C-G formula based on SCr of 0.67 mg/dL).   Medical History: Past Medical History:  Diagnosis Date  . COPD (chronic obstructive pulmonary disease) ([facility])     Assessment: 51yo female c/o SOB x3-4d with sudden onset of central chest tightness, initial istat troponin elevated, to begin heparin.  Heparin level this afternoon slightly below goal at 0.23.  Patient now in the cath lab.  Goal of Therapy:  Heparin level 0.3-0.7 units/ml Monitor platelets by anticoagulation protocol: Yes   Plan:  F/u plans for heparin after cath.  Uvaldo Rising, BCPS  Clinical Pharmacist Pager [phone]  07/15/2017 4:15 PM

## 2017-07-15 NOTE — Interval H&P Note (Signed)
History and Physical Interval Note:  07/15/2017 3:54 PM  Caitlin [name]  has presented today for surgery, with the diagnosis of cp  The various methods of treatment have been discussed with the patient and family. After consideration of risks, benefits and other options for treatment, the patient has consented to  Procedure(s): LEFT HEART CATH AND CORONARY ANGIOGRAPHY (N/A) as a surgical intervention .  The patient's history has been reviewed, patient examined, no change in status, stable for surgery.  I have reviewed the patient's chart and labs.  Questions were answered to the patient's satisfaction.   Cath Lab Visit (complete for each Cath Lab visit)  Clinical Evaluation Leading to the Procedure:   ACS: Yes.    Non-ACS:    Anginal Classification: CCS III  Anti-ischemic medical therapy: No Therapy  Non-Invasive Test Results: No non-invasive testing performed  Prior CABG: No previous CABG        [name] 07/15/2017 3:54 PM

## 2017-07-15 NOTE — Progress Notes (Signed)
[place] Progress Note Patient Name: Caitlin [name] DOB: 01-27-1966 MRN: [id number]   Date of Service  07/15/2017  HPI/Events of Note  Camera check on patient post left heart catheterization. Intubated with bronchospasm. Cath report reviewed revealing normal coronary arteries but with markedly depressed ejection fraction and elevated LVEDP. Patient with agitation and difficult to sedate on propofol infusion. Hypertensive on bedside monitor. 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   eICU Interventions  1. Continuing sedation for tolerance of mechanical ventilation 2. Liberalizing fentanyl IV push to assist with discomfort 3. Versed IV push added to regimen for sedation 4. Continuing titration of propofol  5. Continuing scheduled IV Solu-Medrol 6. Continuing Duonebs every 6 hours      Intervention Category Major Interventions: Respiratory failure - evaluation and management;Delirium, psychosis, severe agitation - evaluation and management  Tera Partridge 07/15/2017, 5:09 PM

## 2017-07-15 NOTE — ED Notes (Signed)
Attempted to trial patient in supine position per dr. Bari Mantis request, pts HOB placed at 45 degrees initially, pt became more tachypneic and tachycardiac with increased work of breathing. Pt placed back in upright position for comfort.

## 2017-07-15 NOTE — ED Notes (Signed)
Spoke with MD regarding iv nitro and lopressor, advised MD that patient's pain was relieved with 1 sl nitro and bp decreased. MD advised this RN to hold iv nitro at this time unless chest pain returned and to administer lopressor if BP increased back into 161W systolic

## 2017-07-15 NOTE — ED Notes (Signed)
Pt reporting increased difficulty breathing again, dr Stanford Breed at bedside to see patient

## 2017-07-15 NOTE — Progress Notes (Signed)
Intubated , will sedate with propofol gtt Vent settings  - Tv 400, peak pr 16, long expiration, set RR at 15 Await pCXR & ABG OK to proceed with cath  Rigoberto Noel MD

## 2017-07-15 NOTE — Consult Note (Signed)
Name: Caitlin [name] MRN: [id number] DOB: Jan 11, 1966    ADMISSION DATE:  07/15/2017 CONSULTATION DATE:  07/15/2017   REFERRING MD :  Dr. Stanford Breed   CHIEF COMPLAINT:  Dyspnea   HISTORY OF PRESENT ILLNESS:   51 year old female with PMH of COPD and tobacco abuse (half a pack per day per year since age 82)  Presented to ED on 8/30 with one week of progressive dyspnea, however also states that over the last 48 hours has had chest tightness with exertion in which has worsened over the last 24 hours. Upon arrival to ED troponin 0.63, EKG with possible septal and inferior infarct, and ECHO with anteroapical akinesis with moderate to severe LV dysfunction. Cardiology to take for emergent cath, however, patient is severely dyspneic and may need intubation prior to procedure. PCCM asked to consult.    PAST MEDICAL HISTORY :   has a past medical history of COPD (chronic obstructive pulmonary disease) ([place]).  has no past surgical history on file. Prior to Admission medications   Medication Sig Start Date End Date Taking? Authorizing Provider  Biotin 1 MG CAPS Take 1 mg by mouth daily.   Yes [provider]  ibuprofen (ADVIL,MOTRIN) 600 MG tablet Take 1 tablet (600 mg total) by mouth every 6 (six) hours as needed. Patient taking differently: Take 600 mg by mouth every 6 (six) hours as needed for moderate pain.  09/09/16  Yes Melynda Ripple, MD  vitamin B-12 (CYANOCOBALAMIN) 100 MCG tablet Take 100 mcg by mouth daily.   Yes [provider]   No Known Allergies  FAMILY HISTORY:  family history includes Aneurysm in her father; Heart disease in her mother; Hypertension in her mother. SOCIAL HISTORY:  reports that she has been smoking Cigarettes.  She has been smoking about 1.00 pack per day. She has never used smokeless tobacco. She reports that she drinks alcohol. She reports that she uses drugs, including Marijuana.  REVIEW OF SYSTEMS:   All negative; except for those that are  bolded, which indicate positives.  Constitutional: weight loss, weight gain, night sweats, fevers, chills, fatigue, weakness.  HEENT: headaches, sore throat, sneezing, nasal congestion, post nasal drip, difficulty swallowing, tooth/dental problems, visual complaints, visual changes, ear aches. Neuro: difficulty with speech, weakness, numbness, ataxia. CV:  chest pain, orthopnea, PND, swelling in lower extremities, dizziness, palpitations, syncope.  Resp: cough, hemoptysis, dyspnea, wheezing. GI: heartburn, indigestion, abdominal pain, nausea, vomiting, diarrhea, constipation, change in bowel habits, loss of appetite, hematemesis, melena, hematochezia.  GU: dysuria, change in color of urine, urgency or frequency, flank pain, hematuria. MSK: joint pain or swelling, decreased range of motion. Psych: change in mood or affect, depression, anxiety, suicidal ideations, homicidal ideations. Skin: rash, itching, bruising.  SUBJECTIVE:  Unable to lie flat due to dyspnea.  VITAL SIGNS: Temp:  [98 F (36.7 C)] 98 F (36.7 C) (08/30 0432) Pulse Rate:  [72-123] 123 (08/30 1245) Resp:  [14-24] 24 (08/30 1245) BP: (123-152)/(85-131) 129/97 (08/30 1300) SpO2:  [93 %-100 %] 94 % (08/30 1300) Weight:  [59 kg (130 lb)] 59 kg (130 lb) (08/30 0432)  PHYSICAL EXAMINATION: General:  Adult female, in distress. Neuro:  A&O x 3, no deficits. HEENT:  [place] / AT. MMM. Cardiovascular:  RRR, no M/R/G. Lungs:  Tripoding, in distress.  Diffuse scattered rhonchi. Abdomen:  BS x 4, S/NT/ND. Musculoskeletal:  No deformities, no edema. Skin:  Warm, dry.   Recent Labs Lab 07/15/17 0434  NA 140  K 3.9  CL  111  CO2 22  BUN 6  CREATININE 0.67  GLUCOSE 103*    Recent Labs Lab 07/15/17 0434  HGB 15.2*  HCT 44.9  WBC 9.1  PLT 345   Dg Chest 2 View  Result Date: 07/15/2017 CLINICAL DATA:  Shortness breath for 4 days, chest tightness for 1 hour. History of COPD. EXAM: CHEST  2 VIEW COMPARISON:  Chest  radiographs April 22, 2012 FINDINGS: Cardiomediastinal silhouette is normal. Mildly calcified aortic knob. He No pleural effusions or focal consolidations. Mild chronic interstitial changes and increased lung volumes. Trachea projects midline and there is no pneumothorax. Soft tissue planes and included osseous structures are non-suspicious. IMPRESSION: Stable examination:  COPD. Electronically Signed   By: Elon Alas M.D.   On: 07/15/2017 05:31    ASSESSMENT / PLAN:  STUDIES:  CXR 8/30 > COPD Echo 8/30 > EF 40 - 45%, G2DD, PAP 38.  CULTURES: Sputum 8/30 >   ANTIBIOTICS: Azithromycin 8/30 >   SIGNIFICANT EVENTS: 8/30 > admit.  LINES/TUBES: ETT 8/30 >   DISCUSSION: 51 y.o. female admitted 8/30 with NSTEMI and respiratory distress possibly due to COPD exacerbation.  Required intubation in ED to facilitate cardiac catheterization.  ASSESSMENT / PLAN:  PULMONARY A: Acute hypoxic respiratory failure - required intubation while in ED to help facilitate cardiac catheterization. COPD - with possible exacerbation. Tobacco dependence. P:   Full vent support. Wean as able. VAP prevention measures. SBT in AM if able. Continue high dose steroids. Continue BD's. Continue empiric azithromycin. CXR in AM. Tobacco cessation counseling.  CARDIOVASCULAR A:  NSTEMI. 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 HTN - new diagnosis. P:  Cardiology consulted and planning for urgent cath following intubation. Trend troponins. F/u on echo results. Avoid beta blockade given significant AECOPD. Will need cardiac meds long term.  RENAL A:   No acute issues. P:   KVO fluids. BMP in AM.  GASTROINTESTINAL A:   GI prophylaxis. Nutrition. P:   SUP: Pantoprazole. NPO.  HEMATOLOGIC A:   VTE Prophylaxis. P:  SCD's / heparin gtt. CBC in AM.  INFECTIOUS A:   AECOPD P:   Abx as above (azithromycin).  Follow cultures as  above.  ENDOCRINE A:   No acute issues.   P:   No interventions required.  NEUROLOGIC A:   Acute encephalopathy - due to sedation. P:   Sedation:  Propofol gtt / Fentanyl PRN. RASS goal: 0 to -1. Daily WUA.  Family updated: Son updated at bedside by Dr. Elsworth Soho.  Interdisciplinary Family Meeting v Palliative Care Meeting:  Due by: 07/21/17.  CC time: 30 min.   [name], [facility] Pager: [phone]  or [phone] 07/15/2017, 1:36 PM

## 2017-07-15 NOTE — ED Notes (Signed)
Pt called out stating that she could not breathe with audible wheezing, pts HOB elevated, NRB placed on pt until PRN neb treatment started. Margarita Mail, PA at bedside. Pt remains a/ox4.

## 2017-07-15 NOTE — ED Provider Notes (Signed)
Medical screening examination/treatment/procedure(s) were conducted as a shared visit with non-physician practitioner(s) and myself.  I personally evaluated the patient during the encounter.   EKG Interpretation  Date/Time:  Thursday July 15 2017 06:51:00 EDT Ventricular Rate:  95 PR Interval:    QRS Duration: 75 QT Interval:  353 QTC Calculation: 444 R Axis:   -80 Text Interpretation:  Sinus arrhythmia Right atrial enlargement Anterior infarct, old diffuse t wave flattening as compared to prior earlier today Confirmed by Virgel Manifold [phone]) on 07/15/2017 7:31:27 AM      51yF with CP and dyspnea. Hx of COPD and certainly wheezing on exam. Chest "tightness" but says this is different than her typical sensation with COPD exacerbations. EKG changed, although nonspecific. Not sure how much of this may be demand ischemia versus possible CAD. Not ideal giving albuterol but diffuse wheezing and increased WOB. Pain improved from arrival, but continues. ASA/heparin. Metoprolol. Nitro. Will need admit.    Virgel Manifold, MD 07/15/17 [phone]

## 2017-07-15 NOTE — Consult Note (Addendum)
Cardiology Consultation:   Patient ID: Caitlin [name]; [id number]; [birth year]   Admit date: 07/15/2017 Date of Consult: 07/15/2017  Primary Care Provider: System, Pcp Not In Primary Cardiologist: New    Patient Profile:   Caitlin [name] is a 51 y.o. female with a hx of COPD and tobacco use who is being seen today for the evaluation of NSTEMI at the request of DR. Klima.  History of Present Illness:   Caitlin [name] presented to the [facility] ED for evaluation of a one week progression of shortness of breath. She says that over the last 48 hours she has had shortness of breath and chest tightness with exertion chest become significantly worse over the last 24 hours. She becomes short of breath with chest tightness with walking only a few feet across the room. This improves after several minutes of rest. She has never had chest pain before and denies any previous cardiac history. She has difficulty lying flat due to dyspnea. She has no pedal edema, palpitations or dizziness. She's not been diagnosed with hypertension or diabetes or hyperlipidemia. She was diagnosed with COPD about a year ago and uses an inhaler for this. Her inhaler has not been helping over the last few days. Prior to this episode she was working full-time as a [employer]arry manager and was able to be quite active with no exertional symptoms. She has 2 children.  The patient is a smoker half pack per day since age 25. She has only rare alcohol. Her mother had some type of heart issue first occurring in her mid to late 57s but the patient has not aware of the details. Her father died of a brain aneurysm and she does not know any of his other medical history. She has 5 brothers and 5 sisters who are all healthy with no cardiac issues.  Significant findings: Troponins 0.40, 0.87 EKG Sinus arrhythmia with anterior Q waves, progressive T wave flattening from initial EKG to subsequent EKG Chest x-ray shows COPD, stable exam SCr 0.67,   K+ 3.9,   Hgb  15.2  Past Medical History:  Diagnosis Date  . COPD (chronic obstructive pulmonary disease) ([place])     History reviewed. No pertinent surgical history.   Home Medications:  Prior to Admission medications   Medication Sig Start Date End Date Taking? Authorizing Provider  Biotin 1 MG CAPS Take 1 mg by mouth daily.   Yes [provider]  ibuprofen (ADVIL,MOTRIN) 600 MG tablet Take 1 tablet (600 mg total) by mouth every 6 (six) hours as needed. Patient taking differently: Take 600 mg by mouth every 6 (six) hours as needed for moderate pain.  09/09/16  Yes Melynda Ripple, MD  vitamin B-12 (CYANOCOBALAMIN) 100 MCG tablet Take 100 mcg by mouth daily.   Yes [provider]    Inpatient Medications: Scheduled Meds: . albuterol      . [START ON 07/16/2017] aspirin EC  81 mg Oral Daily  . ipratropium-albuterol  3 mL Nebulization Q6H  . metoprolol tartrate  5 mg Intravenous Once  . [START ON 07/16/2017] predniSONE  40 mg Oral Q breakfast  . sodium chloride flush  3 mL Intravenous Q12H   Continuous Infusions: . nitroGLYCERIN    . sodium chloride 125 mL/hr at 07/15/17 0831  . azithromycin 500 mg (07/15/17 0937)  . heparin 700 Units/hr (07/15/17 1001)   PRN Meds: acetaminophen **OR** acetaminophen, albuterol, nitroGLYCERIN  Allergies:   No Known Allergies  Social History:   Social History  Social History  . Marital status: Divorced    Spouse name: N/A  . Number of children: N/A  . Years of education: N/A   Occupational History  . Not on file.   Social History Main Topics  . Smoking status: Current Every Day Smoker    Packs/day: 1.00    Types: Cigarettes  . Smokeless tobacco: Never Used  . Alcohol use Yes     Comment: occasional alcohol  . Drug use: Yes    Types: Marijuana     Comment: Occasional marijuana  . Sexual activity: Not on file   Other Topics Concern  . Not on file   Social History Narrative  . No narrative on file    Family History:      Family History  Problem Relation Age of Onset  . Heart disease Mother        age 101's  . Hypertension Mother   . Aneurysm Father        brain     ROS:  Please see the history of present illness.  ROS  All other ROS reviewed and negative.     Physical Exam/Data:   Vitals:   07/15/17 0915 07/15/17 0937 07/15/17 0945 07/15/17 1000  BP:  134/85 (!) 143/107   Pulse: (!) 103 98 93 (!) 103  Resp: 17 17 16 19   Temp:      TempSrc:      SpO2: 96% 98% 99% 96%  Weight:      Height:       No intake or output data in the 24 hours ending 07/15/17 1021 Filed Weights   07/15/17 0432  Weight: 130 lb (59 kg)   Body mass index is 19.2 kg/m.  General:  Well nourished, well developed, Appears short of breath having just returned from the bathroom HEENT: normal Lymph: no adenopathy Neck: no JVD Vascular:  2+ pedal pulses, 2+ femoral pulses Cardiac:  normal S1, S2; RRR; no murmur  Lungs:  Diffuse coarse expiratory wheezes and musical inspiratory wheezes Abd: soft, nontender, no hepatomegaly  Ext: no edema Musculoskeletal:  No deformities, BUE and BLE strength normal and equal Skin: warm and dry  Neuro:  CNs 2-12 intact, no focal abnormalities noted Psych:  Normal affect   EKG:  The EKG was personally reviewed and demonstrates:  Sinus arrhythmia with anterior Q waves, progressive T wave flattening from initial EKG to subsequent EKG  Telemetry:  Telemetry was personally reviewed and demonstrates:  Sinus rhythm/sinus tachycardia with rates in the 80s to low 100s  Relevant CV Studies:  None  Laboratory Data:  Chemistry  Recent Labs Lab 07/15/17 0434  NA 140  K 3.9  CL 111  CO2 22  GLUCOSE 103*  BUN 6  CREATININE 0.67  CALCIUM 9.6  GFRNONAA >60  GFRAA >60  ANIONGAP 7    No results for input(s): PROT, ALBUMIN, AST, ALT, ALKPHOS, BILITOT in the last 168 hours. Hematology  Recent Labs Lab 07/15/17 0434  WBC 9.1  RBC 5.25*  HGB 15.2*  HCT 44.9  MCV 85.5  MCH  29.0  MCHC 33.9  RDW 14.2  PLT 345   Cardiac EnzymesNo results for input(s): TROPONINI in the last 168 hours.   Recent Labs Lab 07/15/17 0523 07/15/17 0933  TROPIPOC 0.40* 0.87*    BNPNo results for input(s): BNP, PROBNP in the last 168 hours.  DDimer No results for input(s): DDIMER in the last 168 hours.  Radiology/Studies:  Dg Chest 2 View  Result Date:  07/15/2017 CLINICAL DATA:  Shortness breath for 4 days, chest tightness for 1 hour. History of COPD. EXAM: CHEST  2 VIEW COMPARISON:  Chest radiographs April 22, 2012 FINDINGS: Cardiomediastinal silhouette is normal. Mildly calcified aortic knob. He No pleural effusions or focal consolidations. Mild chronic interstitial changes and increased lung volumes. Trachea projects midline and there is no pneumothorax. Soft tissue planes and included osseous structures are non-suspicious. IMPRESSION: Stable examination:  COPD. Electronically Signed   By: Elon Alas M.D.   On: 07/15/2017 05:31    Assessment and Plan:   NSTEMI -Patient with 48 hours of chest tightness and dyspnea, significantly worse in the last 24 hours. -Lungs with diffuse coarse wheezing. Patient has a history of COPD treated with inhalers over the last 1 year. She has a history of smoking half pack per day since age 79. -Troponins 0.40, 0.87 -Chest x-ray shows only COPD -EKG has progressive T wave flattening and anterior Q waves -The patient is tachycardic after receiving bronchodilator and prednisone -CVD risk factors include smoking. The patient is hypertensive here, has not been diagnosed prior to this. The patient is not aware of her lipid status. -This picture may represent myocardial ischemia, however, with her diffuse wheezing and difficulty breathing this may represent demand ischemia in the setting of COPD exacerbation -Continue to trend troponins and check stat echocardiogram for LV function and wall motion.  Hypertension -Not previously diagnosed -Blood  pressures elevated in the ED -Monitor and treat as needed  COPD exacerbation -Patient with dyspnea and diffuse wheezing and orthopnea -Currently being treated with nebulizer, steroids and AZithromax  Cigarette smoking -Patient smokes half pack per day since age 71 -Advise smoking cessation, especially in setting of COPD  Signed, Daune Perch, NP  07/15/2017 10:21 AM  As above, patient seen and examined. Briefly she is a 51 year old female with past medical history of COPD, tobacco abuse for evaluation of chest tightness and elevated troponin. Patient states she was diagnosed with COPD approximately 1 year ago. Over the past week she has noticed progressive increased dyspnea on exertion. She also has chest tightness with her dyspnea that increases with inspiration and lying flat. She denies fevers or chills but she has had a cough productive of yellow sputum. She does not have pedal edema, hemoptysis and prior to the past week denies dyspnea on exertion or exertional chest pain. She has been admitted and is presently being treated for COPD flare. Troponin elevated and cardiology asked to evaluate. Physical exam shows diminished breath sounds throughout and diffuse expiratory wheezing. Chest x-ray shows COPD. Troponin I is 0.40, 0.63 and 0.87. Hemoglobin 15.2. Electrocardiogram shows sinus rhythm, right atrial enlargement, septal infarct, inferior infarct, nonspecific ST changes.   1 chest tightness/elevated troponin-patient's presentation seems most consistent with COPD flare. She has dyspnea on exertion, chest tightness that increases with inspiration and productive cough. Her troponin is mildly elevated and her electrocardiogram cannot exclude prior septal and inferior infarct. Would continue to cycle enzymes. If there is a marked increase and she will need cardiac catheterization. We will also check echocardiogram for LV function and to rule out wall motion abnormality. Further management based  on these results. Would continue aspirin and heparin for now. I would avoid beta blockade given severity of lung disease and wheezing.  2 COPD/URI-I agree with continuing steroids, bronchodilators and antibiotics. Management per primary care.  3 tobacco abuse-patient counseled on discontinuing.  4 hypertension-blood pressure is borderline. We will follow and add additional medications as needed.  Kirk Ruths, MD  Addendum: Echo personally reviewed; pt with anteroapical akinesis with moderate to severe LV dysfunction; troponin mildly elevated; ECG with septal MI. 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 However, I am concerned about acute infarct as well. Pt severely dyspneic. I will ask pulmonary to evaluate now as pulmonary status needs to be stabilized. I would then like to proceed with emergent cath to exclude LAD infarct; risks and benefits including MI, CVA and death discussed and pt agrees to proceed. Will add ARB later; will hold on beta blocker given severity of pulmonary disease and wheezing.  Kirk Ruths, MD

## 2017-07-15 NOTE — ED Notes (Signed)
Cards NP at bedside

## 2017-07-15 NOTE — Progress Notes (Signed)
  Echocardiogram 2D Echocardiogram has been performed.  Jennette Dubin 07/15/2017, 12:09 PM

## 2017-07-15 NOTE — Progress Notes (Signed)
ANTICOAGULATION CONSULT NOTE - Initial Consult  Pharmacy Consult for heparin Indication: chest pain/ACS  No Known Allergies  Patient Measurements: Height: 5\' 9"  (175.3 cm) Weight: 130 lb (59 kg) IBW/kg (Calculated) : 66.2  Vital Signs: Temp: 98 F (36.7 C) (08/30 0432) Temp Source: Oral (08/30 0432) BP: 139/100 (08/30 0658) Pulse Rate: 72 (08/30 0658)  Labs:  Recent Labs  07/15/17 0434  HGB 15.2*  HCT 44.9  PLT 345  CREATININE 0.67    Estimated Creatinine Clearance: 75.7 mL/min (by C-G formula based on SCr of 0.67 mg/dL).   Medical History: Past Medical History:  Diagnosis Date  . COPD (chronic obstructive pulmonary disease) ([facility])     Assessment: 51yo female c/o SOB x3-4d with sudden onset of central chest tightness, initial istat troponin elevated, to begin heparin.  Goal of Therapy:  Heparin level 0.3-0.7 units/ml Monitor platelets by anticoagulation protocol: Yes   Plan:  Will give heparin 3000 units IV bolus x1 followed by gtt at 700 units/hr and monitor heparin levels and CBC.  Wynona Neat, PharmD, BCPS  07/15/2017,7:00 AM

## 2017-07-15 NOTE — ED Notes (Signed)
Pt resting comfortably, respirations unlabored, pt reports relief in work of breathing at this time

## 2017-07-15 NOTE — Progress Notes (Signed)
Pt. Intubated by MD without distress.  ETT placement verified with ETCO2 , BBS and CXR.  No incident, placed on vent with ordered settings.

## 2017-07-15 NOTE — Progress Notes (Signed)
[facility] Progress Note Patient Name: Caitlin [name] DOB: 1966/07/24 MRN: [id number]   Date of Service  07/15/2017  HPI/Events of Note  Notified by respiratory therapist of results of ABG. PH 7.156, PCO2 68.6, & PaO2 441. Currently on FiO2 1.0. Set rate 15 with respirations 2-3 above set rate. Camera shows respiratory therapist at bedside and patient comfortable. Recently transitioned to fentanyl infusion to improve patient comfort and ventilator synchrony.   eICU Interventions  1. Weaning FiO2 to 0.6 to maintain saturation greater than 92% 2. Increasing respiratory rate to 26 3. Repeat ABG at 7:30 PM 4. Awaiting portable chest x-ray to confirm tube placement      Intervention Category Major Interventions: Respiratory failure - evaluation and management  Tera Partridge 07/15/2017, 6:22 PM

## 2017-07-15 NOTE — H&P (Signed)
Date: 07/15/2017               Patient Name:  Caitlin [name] MRN: [id number]  DOB: 05-Jul-1966 Age / Sex: 51 y.o., female   PCP: System, Pcp Not In         Medical Service: Internal Medicine Teaching Service         Attending Physician: Dr. Oval Linsey, MD    First Contact: Dr. Ronalee Red Pager: [phone]  Second Contact: Dr. Charlynn Grimes Pager: [phone]       After Hours (After 5p/  First Contact Pager: [phone]  weekends / holidays): Second Contact Pager: [phone]   Chief Complaint: shortness of breath  History of Present Illness:  Caitlin [name] is a 51yo female with PMH significant for COPD and tobacco use who presents with 1 week of progressive shortness of breath. She states that for the last week, she has been feeling more dyspneic with exertion. For the last week, she has had to use her albuterol inhaler daily with some improvement in her dyspnea. She woke up this morning at 3am with acute onset shortness of breath that was not relieved with her inhaler, which prompted her to come to the ED. She also complains of left-sided "chest tightness" that radiates to her back, is a 5/10 in severity, and is worsened with deep breaths and coughing. She endorses associated diaphoresis and nausea with no emesis. She notes a new cough productive of yellow-ish sputum for the last week, but denies fevers or chills.  In the ER, BP 140/92, HR 101, RR 22, temp 98, O2 sats 95% on 2L [place]. Labs significant for troponin of 0.4, CBC and BMP unremarkable (Cr 0.67). She was given sublingual NTG, which relieved her chest pain and BP improved. Also given albuterol nebulizer, ipratropium nebulizer, IV solumedrol 125mg , IV mag sulfate, as well as aspirin 324mg , started on heparin drip, 1L NS bolus.  She states her shortness of breath is improved with albuterol nebulizer and is currently on 6.5L O2 [place] with sats >95%. The chest pain is improved after the NTG, but gave her a headache. She has never been hospitalized for a COPD  exacerbation and states she normally uses her albuterol inhaler once a month, but has used up the whole inhaler in the last week. She endorses smoking 1/2ppd, alcohol use 1-2 days/week, and occasional marijuana use. Last used 2-3 days ago. She visited [place] a couple weeks ago. No sick contacts or other changes to her routine in the last week.  She also reports increased urinary frequency and urgency. Denies dysuria or hematuria. Denies abdominal pain, BM changes, LE edema.  Meds:  Albuterol inhaler Biotin Vitamin B12  Allergies: Allergies as of 07/15/2017  . (No Known Allergies)   Past Medical History:  Diagnosis Date  . COPD (chronic obstructive pulmonary disease) (HCC)    Past Surgical History: None  Family History: No family history of lung disease or lung cancer Mom with heart disease  Social History: - lives at home by herself - in the room with sister, brother, and son - smokes 1/2ppd - alcohol use 1-2 days/week - occasional marijuana use, last time 2-3 days ago - no other illicit drugs  Review of Systems: A complete ROS was negative except as per HPI.  Physical Exam: Blood pressure (!) 125/92, pulse 100, temperature 98 F (36.7 C), temperature source Oral, resp. rate 16, height 5\' 9"  (1.753 m), weight 130 lb (59 kg), last menstrual period 09/09/2014, SpO2 99 %.  GEN: Well-appearing AA female lying in bed with nebulizer on [place]; in NAD RESP: Diffuse inspiratory and expiratory wheezes bilaterally. Some accessory muscle use CV: Normal rate and regular rhythm. No murmurs, gallops, or rubs. No LE edema. ABD: Soft. Non-tender. Non-distended. Normoactive bowel sounds. EXT: No edema. Warm. 2+ DP pulses NEURO: CN II-XII grossly intact; moves all 4 extremities against gravity; no focal deficits  Labs CBC Latest Ref Rng & Units 07/15/2017 04/22/2012  WBC 4.0 - 10.5 K/uL 9.1 8.7  Hemoglobin 12.0 - 15.0 g/dL 15.2(H) 12.8  Hematocrit 36.0 - 46.0 % 44.9 38.4  Platelets 150 -  400 K/uL 345 452(H)   BMP Latest Ref Rng & Units 07/15/2017 04/22/2012  Glucose 65 - 99 mg/dL 103(H) 90  BUN 6 - 20 mg/dL 6 6  Creatinine 0.44 - 1.00 mg/dL 0.67 0.66  Sodium 135 - 145 mmol/L 140 140  Potassium 3.5 - 5.1 mmol/L 3.9 4.4  Chloride 101 - 111 mmol/L 111 106  CO2 22 - 32 mmol/L 22 20  Calcium 8.9 - 10.3 mg/dL 9.6 10.0   Troponin 0.40  EKG: Sinus arrhythmia; diffuse T wave flattening  CXR: No acute cardiopulmonary disease. Mild chronic interstitial changes and increased lung volumes  Assessment & Plan by Problem: Active Problems:   COPD with acute exacerbation ([place])  Caitlin [name] is a 51yo female with PMH significant for COPD and tobacco use who presents with 1 week of progressive shortness of breath and productive cough associated with new chest tightness and nausea, concerning for acute exacerbation of COPD.  # Acute exacerbation of COPD Reports 1 week of worsening dyspnea on exertion with acute onset of shortness of breath this morning that prompted her to come to the ED. Has also had cough productive of yellow sputum for the last week, no fevers or chills. Has never been hospitalized for COPD and usually well-controlled with albuterol inhaler use 1x/month. No other medications and does not have PCP. No sick contacts or recent travel. CXR without evidence of pneumonia. Lung exam with diffuse wheezing and accessory muscle use. Shortness of breath improved with albuterol and ipratropium nebulizer and oxygen. Has received IV Mg and IV solumedrol. - Continuous pulse ox - Albuterol nebulizer - Azithromycin 500mg  qday x3d - Duoneb q6h - Supplemental O2 as needed - Prednisone 40mg   # Chest pain DDx: demand ischemia vs NSTEMI Developed 5/10 chest tightness this morning that feels different than her usual COPD exacerbations. Associated with diaphoresis and nausea, radiates to her back. States it is worse with coughing and deep breaths. Pain is relieved with SL NTG. No personal  history of CAD. Is a daily smoker. Troponin 0.4. EKG with diffuse T wave flattening. Possibly demand ischemia 2/2 acute COPD exacerbation but with dynamic EKG changes and elevated troponin, ACS rule out is needed. S/p Aspirin 324mg , SL NTG, and heparin drip. - Cardiology consulted - telemetry - SL NTG as needed - UDS - cont heparin drip - aspirin 81mg  daily - trend troponins - EKG tomorrow AM - A1c, HCV, HIV, lipid panel tomorrow AM - CBC and BMET tomorrow AM  # Elevated blood pressure No hx of HTN. BP elevated to 140/92 on admission, which improved with SL NTG. Now improved to 120s-130s/80s-90s. - Will continue to monitor  # Urinary frequency and urgency Denies dysuria and hematuria - UA  Diet: Regular VTE PPx: heparin drip  Dispo: Admit patient to Observation with expected length of stay less than 2 midnights.  Signed: Colbert Ewing, MD  Internal Medicine,  PGY-1 07/15/2017, 9:11 AM  P [phone]

## 2017-07-15 NOTE — Procedures (Signed)
Intubation Procedure Note Caitlin [name] [id number] [birth year]  Procedure: Intubation Indications: Respiratory insufficiency  Procedure Details Consent: Risks of procedure as well as the alternatives and risks of each were explained to the (patient/caregiver).  Consent for procedure obtained. Time Out: Verified patient identification, verified procedure, site/side was marked, verified correct patient position, special equipment/implants available, medications/allergies/relevent history reviewed, required imaging and test results available.  Performed  Maximum sterile technique was used including gloves, hand hygiene and mask.  3    Evaluation Hemodynamic Status: BP stable throughout; O2 sats: stable throughout Patient's Current Condition: stable Complications: No apparent complications Patient did tolerate procedure well. Chest X-ray ordered to verify placement.  CXR: pending.   Hayden Pedro, AGACNP-BC [facility]  Pgr: [phone]  PCCM Pgr: [phone]

## 2017-07-15 NOTE — ED Notes (Signed)
Admitting team made aware of pts sudden onset of difficulty breathing after echo as well as administration of duoneb and continous neb that ED PA ordered

## 2017-07-15 NOTE — ED Triage Notes (Signed)
Pt reports SOB X 3-4 days but sudden onset of central chest tightness approx 1 hr ago is what made her come to ED. Central, non radiation, 6/10. Labored breathing, tachypnea in triage.

## 2017-07-15 NOTE — Progress Notes (Signed)
eLink Physician-Brief Progress Note Patient Name: Caitlin [name] DOB: 03-Aug-1966 MRN: [id number]   Date of Service  07/15/2017  HPI/Events of Note  Patient status post endotracheal intubation. Transported to the cath lab after intubation. Postintubation portable chest x-ray with endotracheal tube 1.7 cm above carina.   eICU Interventions  1. Stat ABG to evaluate ventilation 2. Stat portable chest x-ray to confirm tube placement post-transport      Intervention Category Major Interventions: Respiratory failure - evaluation and management  Tera Partridge 07/15/2017, 5:58 PM

## 2017-07-16 ENCOUNTER — Encounter (HOSPITAL_COMMUNITY): Payer: Self-pay | Admitting: Cardiology

## 2017-07-16 ENCOUNTER — Inpatient Hospital Stay (HOSPITAL_COMMUNITY): Payer: Self-pay

## 2017-07-16 DIAGNOSIS — I420 Dilated cardiomyopathy: Secondary | ICD-10-CM

## 2017-07-16 DIAGNOSIS — J9601 Acute respiratory failure with hypoxia: Secondary | ICD-10-CM | POA: Diagnosis not present

## 2017-07-16 DIAGNOSIS — I214 Non-ST elevation (NSTEMI) myocardial infarction: Secondary | ICD-10-CM | POA: Diagnosis not present

## 2017-07-16 DIAGNOSIS — I5043 Acute on chronic combined systolic (congestive) and diastolic (congestive) heart failure: Secondary | ICD-10-CM | POA: Diagnosis not present

## 2017-07-16 DIAGNOSIS — J441 Chronic obstructive pulmonary disease with (acute) exacerbation: Secondary | ICD-10-CM | POA: Diagnosis not present

## 2017-07-16 LAB — GLUCOSE, CAPILLARY
GLUCOSE-CAPILLARY: 99 mg/dL (ref 65–99)
GLUCOSE-CAPILLARY: 121 mg/dL — AB (ref 65–99)
GLUCOSE-CAPILLARY: 125 mg/dL — AB (ref 65–99)

## 2017-07-16 LAB — CBC
HEMATOCRIT: 40.8 % (ref 36.0–46.0)
HEMOGLOBIN: 13.2 g/dL (ref 12.0–15.0)
MCH: 28.3 pg (ref 26.0–34.0)
MCHC: 32.4 g/dL (ref 30.0–36.0)
MCV: 87.4 fL (ref 78.0–100.0)
Platelets: 287 10*3/uL (ref 150–400)
RBC: 4.67 MIL/uL (ref 3.87–5.11)
RDW: 14.8 % (ref 11.5–15.5)
WBC: 11.6 10*3/uL — ABNORMAL HIGH (ref 4.0–10.5)

## 2017-07-16 LAB — BASIC METABOLIC PANEL
Anion gap: 9 (ref 5–15)
BUN: 11 mg/dL (ref 6–20)
CHLORIDE: 114 mmol/L — AB (ref 101–111)
CO2: 21 mmol/L — AB (ref 22–32)
CREATININE: 0.81 mg/dL (ref 0.44–1.00)
Calcium: 8.9 mg/dL (ref 8.9–10.3)
GFR calc non Af Amer: >60 mL/min (ref >60)
Glucose, Bld: 152 mg/dL — ABNORMAL HIGH (ref 65–99)
Potassium: 3.6 mmol/L (ref 3.5–5.1)
Sodium: 144 mmol/L (ref 135–145)

## 2017-07-16 LAB — URINALYSIS, ROUTINE W REFLEX MICROSCOPIC
BILIRUBIN URINE: NEGATIVE
Bacteria, UA: NONE SEEN
Glucose, UA: NEGATIVE mg/dL
HGB URINE DIPSTICK: NEGATIVE
KETONES UR: NEGATIVE mg/dL
LEUKOCYTES UA: NEGATIVE
Nitrite: NEGATIVE
PROTEIN: 30 mg/dL — AB
Specific Gravity, Urine: 1.033 — ABNORMAL HIGH (ref 1.005–1.030)
pH: 5 (ref 5.0–8.0)

## 2017-07-16 LAB — LIPID PANEL
CHOL/HDL RATIO: 3.3 ratio
Cholesterol: 172 mg/dL (ref 0–200)
HDL: 52 mg/dL (ref >40)
LDL Cholesterol: 103 mg/dL — ABNORMAL HIGH (ref 0–99)
Triglycerides: 87 mg/dL (ref <150)
VLDL: 17 mg/dL (ref 0–40)

## 2017-07-16 LAB — HEPATITIS C ANTIBODY (REFLEX): HCV Ab: <0.1 {s_co_ratio} (ref 0.0–0.9)

## 2017-07-16 LAB — RAPID URINE DRUG SCREEN, HOSP PERFORMED
Amphetamines: NOT DETECTED
Barbiturates: NOT DETECTED
Benzodiazepines: POSITIVE — AB
Cocaine: NOT DETECTED
Opiates: NOT DETECTED
Tetrahydrocannabinol: POSITIVE — AB

## 2017-07-16 LAB — HIV ANTIBODY (ROUTINE TESTING W REFLEX): HIV SCREEN 4TH GENERATION: NONREACTIVE

## 2017-07-16 LAB — MRSA PCR SCREENING: MRSA by PCR: NEGATIVE

## 2017-07-16 LAB — TROPONIN I: Troponin I: 1.53 ng/mL

## 2017-07-16 LAB — HCV COMMENT:

## 2017-07-16 MED ORDER — METHYLPREDNISOLONE SODIUM SUCC 125 MG IJ SOLR
80.0000 mg | Freq: Three times a day (TID) | INTRAMUSCULAR | Status: DC
  Administered 2017-07-16 – 2017-07-17 (×2): 80 mg via INTRAVENOUS
  Filled 2017-07-16 (×2): qty 2

## 2017-07-16 MED ORDER — POTASSIUM CHLORIDE CRYS ER 20 MEQ PO TBCR
20.0000 meq | EXTENDED_RELEASE_TABLET | Freq: Once | ORAL | Status: AC
  Administered 2017-07-16: 20 meq via ORAL
  Filled 2017-07-16: qty 1

## 2017-07-16 MED ORDER — ASPIRIN 81 MG PO CHEW
81.0000 mg | CHEWABLE_TABLET | Freq: Once | ORAL | Status: AC
  Administered 2017-07-16: 81 mg via ORAL
  Filled 2017-07-16: qty 1

## 2017-07-16 MED ORDER — SODIUM CHLORIDE 0.9 % IV BOLUS (SEPSIS)
500.0000 mL | Freq: Once | INTRAVENOUS | Status: AC
  Administered 2017-07-16: 500 mL via INTRAVENOUS

## 2017-07-16 MED ORDER — POTASSIUM CHLORIDE 10 MEQ/100ML IV SOLN
10.0000 meq | INTRAVENOUS | Status: DC
  Administered 2017-07-16: 10 meq via INTRAVENOUS
  Filled 2017-07-16 (×2): qty 100

## 2017-07-16 MED ORDER — METOPROLOL TARTRATE 12.5 MG HALF TABLET
12.5000 mg | ORAL_TABLET | Freq: Two times a day (BID) | ORAL | Status: DC
  Administered 2017-07-16 – 2017-07-18 (×4): 12.5 mg via ORAL
  Filled 2017-07-16 (×4): qty 1

## 2017-07-16 MED ORDER — ORAL CARE MOUTH RINSE
15.0000 mL | Freq: Two times a day (BID) | OROMUCOSAL | Status: DC
  Administered 2017-07-16 – 2017-07-19 (×3): 15 mL via OROMUCOSAL

## 2017-07-16 MED ORDER — HYDRALAZINE HCL 20 MG/ML IJ SOLN: 10.0000 mg | Freq: Four times a day (QID) | INTRAMUSCULAR | Status: DC | PRN

## 2017-07-16 MED ORDER — ENSURE ENLIVE PO LIQD
237.0000 mL | Freq: Two times a day (BID) | ORAL | Status: DC
  Administered 2017-07-17 – 2017-07-19 (×2): 237 mL via ORAL

## 2017-07-16 MED ORDER — ALPRAZOLAM 0.5 MG PO TABS
0.5000 mg | ORAL_TABLET | Freq: Three times a day (TID) | ORAL | Status: DC | PRN
  Administered 2017-07-16 – 2017-07-18 (×4): 0.5 mg via ORAL
  Filled 2017-07-16 (×5): qty 1

## 2017-07-16 MED ORDER — INSULIN ASPART 100 UNIT/ML ~~LOC~~ SOLN
2.0000 [IU] | SUBCUTANEOUS | Status: DC
  Administered 2017-07-16 – 2017-07-18 (×2): 2 [IU] via SUBCUTANEOUS

## 2017-07-16 MED ORDER — METHYLPREDNISOLONE SODIUM SUCC 125 MG IJ SOLR
80.0000 mg | Freq: Four times a day (QID) | INTRAMUSCULAR | Status: DC
  Administered 2017-07-16: 80 mg via INTRAVENOUS
  Filled 2017-07-16: qty 2

## 2017-07-16 NOTE — Progress Notes (Signed)
   07/16/17 1825  Clinical Encounter Type  Visited With Patient and family together  Visit Type Other (Comment) ([place] consult)  Spiritual Encounters  Spiritual Needs Emotional  Stress Factors  Patient Stress Factors Health changes  Family Stress Factors None identified  Introduction to Pt and family. Pt reported no need for AD.

## 2017-07-16 NOTE — Plan of Care (Signed)
Problem: Safety: Goal: Ability to remain free from injury will improve Outcome: Progressing Patient agrees to safety plan  Problem: Pain Managment: Goal: General experience of comfort will improve Outcome: Progressing Denies pain; patient resting comfortably  Problem: Physical Regulation: Goal: Ability to maintain clinical measurements within normal limits will improve Outcome: Progressing HR improving with metoprolol; BP stable  Problem: Activity: Goal: Risk for activity intolerance will decrease Outcome: Progressing OOB to chair today  Problem: Fluid Volume: Goal: Ability to maintain a balanced intake and output will improve Outcome: Progressing Void x1 after foley removal  Problem: Nutrition: Goal: Adequate nutrition will be maintained Outcome: Progressing Diet progressed  Problem: Coping: Goal: Level of anxiety will decrease Outcome: Progressing Patient coping well; speaking with family and friends; is calm and open to learning about disease processes/symptoms/complications  Problem: Respiratory: Goal: Ability to maintain a clear airway and adequate ventilation will improve Outcome: Progressing Off ventilator on [place] 2L; breathing comfortably Sats>92

## 2017-07-16 NOTE — Progress Notes (Signed)
Patient noted to be having lower extremity tension and restlessness along with no UO since admission to unit with external catheter placement;  Bladder scan at approximately 2030 showed approximately 442 mL of urine; repeat scan at approximately 2230 showed 452 mL;  MD notified; verbal order given to insert foley catheter.  Clyda Hurdle RN

## 2017-07-16 NOTE — Progress Notes (Signed)
Urine output 0 mL from 0200-0300; bladder scan volume 67mL; foley in place; MD notified; fluid bolus 500 mL NS ordered; will continue to monitor  Clyda Hurdle RN

## 2017-07-16 NOTE — Progress Notes (Signed)
Patient SBP<90 for 1hr, Cardiology notified and informed of decreased UO and 577mL NS bolus.  Orders given to monitor and await AM lab results.  Clyda Hurdle RN

## 2017-07-16 NOTE — Progress Notes (Signed)
Progress Note  Patient Name: Caitlin [name] Date of Encounter: 07/16/2017  Primary Cardiologist: Dr Stanford Breed  Subjective   Intubated and sedated  Inpatient Medications    Scheduled Meds: . aspirin EC  81 mg Oral Daily  . chlorhexidine gluconate (MEDLINE KIT)  15 mL Mouth Rinse BID  . enoxaparin (LOVENOX) injection  40 mg Subcutaneous Q24H  . fentaNYL (SUBLIMAZE) injection  50 mcg Intravenous Once  . ipratropium-albuterol  3 mL Nebulization Q6H  . mouth rinse  15 mL Mouth Rinse QID  . methylPREDNISolone (SOLU-MEDROL) injection  125 mg Intravenous Q8H  . nitroGLYCERIN  1 inch Topical Q6H  . pantoprazole (PROTONIX) IV  40 mg Intravenous Daily  . sodium chloride flush  3 mL Intravenous Q12H  . sodium chloride flush  3 mL Intravenous Q12H  . sodium chloride flush  3 mL Intravenous Q12H   Continuous Infusions: . sodium chloride 125 mL/hr at 07/16/17 0600  . sodium chloride    . sodium chloride 10 mL/hr at 07/16/17 0600  . sodium chloride    . azithromycin Stopped (07/15/17 1037)  . fentaNYL infusion INTRAVENOUS 40 mcg/hr (07/16/17 0600)  . propofol (DIPRIVAN) infusion 40.113 mcg/kg/min (07/16/17 0600)   PRN Meds: sodium chloride, sodium chloride, acetaminophen, albuterol, fentaNYL, fentaNYL (SUBLIMAZE) injection, midazolam, ondansetron (ZOFRAN) IV, sodium chloride flush, sodium chloride flush   Vital Signs    Vitals:   07/16/17 0615 07/16/17 0630 07/16/17 0645 07/16/17 0700  BP: 104/84 95/80 (!) 84/68 92/68  Pulse:      Resp: (!) 26 (!) 26 (!) 26 (!) 26  Temp:      TempSrc:      SpO2:  100% 100% 100%  Weight:      Height:        Intake/Output Summary (Last 24 hours) at 07/16/17 0720 Last data filed at 07/16/17 0600  Gross per 24 hour  Intake          3828.84 ml  Output              675 ml  Net          3153.84 ml   Filed Weights   07/15/17 0432 07/15/17 1720  Weight: 59 kg (130 lb) 58.6 kg (129 lb 3 oz)    Telemetry    Sinus tachycardia with NSVT-  Personally Reviewed   Physical Exam   GEN: WD/WN intubated  Neck: positive JVD Cardiac: regular, tachycardic Respiratory: Mild exp wheeze GI: Soft, nontender, non-distended  MS: No edema Neuro:  intubated; moves ext   [place] Lab 07/15/17 0434 07/15/17 2307 07/16/17 0253  NA 140 143 144  K 3.9 3.9 3.6  CL 111 113* 114*  CO2 22 20* 21*  GLUCOSE 103* 157* 152*  BUN '6 9 11  '$ CREATININE 0.67 0.74 0.81  CALCIUM 9.6 9.1 8.9  GFRNONAA >60 >60 >60  GFRAA >60 >60 >60  ANIONGAP '7 10 9     '$ Hematology Recent Labs Lab 07/15/17 0434 07/15/17 2307 07/16/17 0253  WBC 9.1 12.8* 11.6*  RBC 5.25* 4.97 4.67  HGB 15.2* 13.9 13.2  HCT 44.9 43.4 40.8  MCV 85.5 87.3 87.4  MCH 29.0 28.0 28.3  MCHC 33.9 32.0 32.4  RDW 14.2 15.0 14.8  PLT 345 260 287    Cardiac Enzymes Recent Labs Lab 07/15/17 0913 07/15/17 1441 07/15/17 2302  TROPONINI 0.63* 1.42* 1.53*    Recent Labs Lab 07/15/17 0523 07/15/17 0933 07/15/17 1324  TROPIPOC 0.40* 0.87*  1.46*      Radiology    Dg Chest 2 View  Result Date: 07/15/2017 CLINICAL DATA:  Shortness breath for 4 days, chest tightness for 1 hour. History of COPD. EXAM: CHEST  2 VIEW COMPARISON:  Chest radiographs April 22, 2012 FINDINGS: Cardiomediastinal silhouette is normal. Mildly calcified aortic knob. He No pleural effusions or focal consolidations. Mild chronic interstitial changes and increased lung volumes. Trachea projects midline and there is no pneumothorax. Soft tissue planes and included osseous structures are non-suspicious. IMPRESSION: Stable examination:  COPD. Electronically Signed   By: Elon Alas M.D.   On: 07/15/2017 05:31   Dg Chest Port 1 View  Result Date: 07/15/2017 CLINICAL DATA:  Ventilator dependent respiratory failure. Repositioning of the endotracheal tube. EXAM: PORTABLE CHEST 1 VIEW 6:41 p.m.: COMPARISON:  Portable chest x-ray earlier today 3:28 p.m. Two-view chest x-ray earlier today  5:04 a.m. and 04/22/2012. FINDINGS: Endotracheal tube tip in satisfactory position projecting approximately 5 cm above the carina. Cardiac silhouette normal in size, unchanged. Thoracic aorta atherosclerotic. Hilar and mediastinal contours otherwise unremarkable. Lungs hyperinflated but clear. No pneumothorax. No visible pleural effusions. IMPRESSION: 1. Endotracheal tube tip in satisfactory position approximately 5 cm above the carina. 2. Hyperinflation indicating COPD and/or asthma. No acute cardiopulmonary disease. Electronically Signed   By: Evangeline Dakin M.D.   On: 07/15/2017 20:05   Dg Chest Port 1 View  Result Date: 07/15/2017 CLINICAL DATA:  Respiratory failure. EXAM: PORTABLE CHEST 1 VIEW COMPARISON:  07/15/2017 FINDINGS: The heart size and mediastinal contours are within normal limits. Aortic atherosclerosis without aneurysm. Endotracheal tube tip is slightly low-lying at 1.7 cm above the carina. Pullback an additional 1.5 cm recommended. Gastric tube extends below the left hemidiaphragm. Tip is excluded on this study. Both lungs are clear but slightly hyperinflated. The visualized skeletal structures are unremarkable. IMPRESSION: 1. Slightly low-lying endotracheal tube at 1.7 cm above the carina. Pullback roughly 1.5 cm suggested. 2. Hyperinflated appearing lungs without pneumonic consolidation or CHF. Electronically Signed   By: Ashley Royalty M.D.   On: 07/15/2017 15:48     Patient Profile     51 year old female with past medical history of COPD, tobacco abuse admitted with COPD, chest tightness and elevated troponin. Echocardiogram demonstrated ejection fraction 40-45% with akinesis of the anteroseptal, anterior, inferoseptal and apical myocardium. Grade 2 diastolic dysfunction. Patient intubated and taken to the catheterization laboratory. Patient had normal coronary arteries. Left ventricular end-diastolic pressure 35.   Assessment & Plan    1 NSTEMI/Takatsubo cardiomyopathy-Patient  presented with complaints of dyspnea, COPD flare and troponin mildly elevated. Echocardiogram consistent with takatsubo cardiomyopathy. Cardiac catheterization revealed normal coronary arteries. Blood pressure is borderline on ventilator. We cannot add ARB or beta blocker at this point but will later as BP allows. Patient will need follow-up echocardiogram in 8-12 weeks to see if LV function has improved. DC NT Paste.  2 Acute systolic congestive heart failure-left ventricular end-diastolic pressure 35 and cardiac catheterization laboratory. CHF likely exacerbated her COPD. Discontinue IV fluids. We will follow and diurese as needed.   3 VDRF-Vent wean per pulmonary.   4 COPD/URI-I agree with continuing steroids, bronchodilators and antibiotics. Management per CCM.  3 tobacco abuse-patient previously counseled on discontinuing.   Signed, Kirk Ruths, MD  07/16/2017, 7:20 AM

## 2017-07-16 NOTE — Progress Notes (Signed)
100cc fentanyl gtt wasted in sink, witnessed by 2 RNs.  Romilda Joy, RN Lesli Albee, RN

## 2017-07-16 NOTE — Progress Notes (Signed)
Patient became agitated during changing of propofol IV tubing; patient was dyssynchronous with vent, attempting to get out of bed and attempting to remove/pull out tubing and lines; HR increased to sustained 140; 50 mcg fentanyl given; 2mg  versed given; propofol increased to 23mcg; patient became sedated and synchronous with vent but HR sustained in 140; EKG done and MD notified and concurred that there were no visible changes in EKG and to treat potential underlying reasons for increased HR;  Will continue to monitor.  Clyda Hurdle RN

## 2017-07-16 NOTE — Care Management Note (Signed)
Case Management Note  Patient Details  Name: TRAVIS PURK MRN: [id number] Date of Birth: [birth year]  Subjective/Objective:    From home, presents with copd, chest tightness and elevated trops.   NSTEMI/Takatsubo Cardiomyopathy, acute sys chf, now on vent.                  Action/Plan: NCM will follow for dc needs.   Expected Discharge Date:                  Expected Discharge Plan:     In-House Referral:     Discharge planning Services  CM Consult  Post Acute Care Choice:    Choice offered to:     DME Arranged:    DME Agency:     HH Arranged:    HH Agency:     Status of Service:  In process, will continue to follow  If discussed at Long Length of Stay Meetings, dates discussed:    Additional Comments:  Zenon Mayo, RN 07/16/2017, 10:40 AM

## 2017-07-16 NOTE — Progress Notes (Addendum)
Initial Nutrition Assessment  DOCUMENTATION CODES:   Not applicable  INTERVENTION:    Ensure Enlive po BID, each supplement provides 350 kcal and 20 grams of protein  NUTRITION DIAGNOSIS:   Increased nutrient needs related to chronic illness as evidenced by estimated needs  GOAL:   Patient will meet greater than or equal to 90% of their needs  MONITOR:   PO intake, Supplement acceptance, Labs, Skin, I & O's  REASON FOR ASSESSMENT:   Malnutrition Screening Tool  ASSESSMENT:   51 yo Female with PMH of COPD, tobacco abuse admitted with COPD, chest tightness and elevated troponin.  Pt admitted with COPD admitted for dyspnea on exertion and increased wheezing. Was intubated for emergent cath that showed normal coronaries. Extubated this AM.  Pt reports a good appetite PTA. Typically consumes 3 meals per day. States she's hungry. Would benefit from addition of oral nutrition supplements given disease state. Labs and medications reviewed. CBG 99.  Unable to complete Nutrition-Focused physical exam at this time.  Suspect malnutrition.  Diet Order:  Diet Heart Room service appropriate? Yes; Fluid consistency: Thin  Skin:  Reviewed, no issues  Last BM:  PTA  Height:   Ht Readings from Last 1 Encounters:  07/15/17 5\' 9"  (1.753 m)    Weight:   Wt Readings from Last 1 Encounters:  07/15/17 129 lb 3 oz (58.6 kg)    Ideal Body Weight:  73 kg  BMI:  Body mass index is 19.08 kg/m.  Estimated Nutritional Needs:   Kcal:  7824-2353  Protein:  80-95 gm  Fluid:  1.7-1.9 L  EDUCATION NEEDS:   No education needs identified at this time  Arthur Holms, RD, LDN Pager #: [phone] After-Hours Pager #: [phone]

## 2017-07-16 NOTE — Progress Notes (Signed)
[facility] Progress Note Patient Name: Caitlin [name] DOB: [birth year] MRN: [id number]   Date of Service  07/16/2017  HPI/Events of Note  Patient was sinus tachycardia & stress-induced cardiomyopathy. Reviewed cardiology note. Patient currently normotensive.   eICU Interventions  1. Continuous telemetry monitoring 2. Starting low-dose Lopressor 12.5 mg twice daily with hold parameters 3. Monitoring vitals per unit protocol      Intervention Category Major Interventions: Other:  Tera Partridge 07/16/2017, 7:00 PM

## 2017-07-16 NOTE — Plan of Care (Signed)
Problem: Fluid Volume: Goal: Ability to maintain a balanced intake and output will improve Outcome: Not Progressing Foley placed for acute urinary retention  Problem: Cardiovascular: Goal: Ability to achieve and maintain adequate cardiovascular perfusion will improve Outcome: Not Progressing Sustained tachycardia; no EKG changes  Problem: Respiratory: Goal: Ability to maintain a clear airway and adequate ventilation will improve Outcome: Progressing Acidosis improved with vent changes; repeat ABG ph WNL

## 2017-07-16 NOTE — Procedures (Signed)
Extubation Procedure Note  Patient Details:   Name: Caitlin [name] DOB: [birth year] MRN: [id number]   Airway Documentation:  Airway 7.5 mm (Active)  Secured at (cm) 25 cm 07/16/2017  8:01 AM  Measured From Lips 07/16/2017  8:01 AM  Secured Location Right 07/16/2017  8:01 AM  Secured By [employer] 07/16/2017  8:01 AM  Tube Holder Repositioned Yes 07/16/2017  8:01 AM  Cuff Pressure (cm H2O) 26 cm H2O 07/16/2017  8:01 AM  Site Condition Dry 07/16/2017  8:01 AM    Evaluation  O2 sats: stable throughout Complications: No apparent complications Patient did tolerate procedure well. Bilateral Breath Sounds: Clear, Diminished   Yes   Patient able to speak, positive cuff leak noted, no evidence of stridor, vitals stable throughout and currently. Nurse stated she will instruct patient on IS, patient is on [facility], RT will continue to monitor.   Justo Hengel N Brooke Payes 07/16/2017, 10:36 AM

## 2017-07-16 NOTE — Progress Notes (Signed)
Name: Caitlin [name] MRN: [id number] DOB: 08-12-1966    ADMISSION DATE:  07/15/2017 CONSULTATION DATE:  07/15/2017   REFERRING MD :  Dr. Stanford Breed   CHIEF COMPLAINT:  Dyspnea   Brief:   51 year old female with PMH of COPD and tobacco abuse (half a pack per day per year since age 51)  Presented to ED on 8/30 with one week of progressive dyspnea, however also states that over the last 48 hours has had chest tightness with exertion in which has worsened over the last 24 hours. Upon arrival to ED troponin 0.63, EKG with possible septal and inferior infarct, and ECHO with anteroapical akinesis with moderate to severe LV dysfunction. Cardiology to take for emergent cath, however, patient is severely dyspneic and may need intubation prior to procedure. PCCM asked to consult.   SUBJECTIVE: Clear Cath, No events overnight.   VITAL SIGNS: Temp:  [97.6 F (36.4 C)-98.5 F (36.9 C)] 98.5 F (36.9 C) (08/31 0844) Pulse Rate:  [101-151] 117 (08/31 0801) Resp:  [14-30] 26 (08/31 0930) BP: (67-182)/(55-136) 90/70 (08/31 0930) SpO2:  [94 %-100 %] 100 % (08/31 0930) FiO2 (%):  [40 %-100 %] 40 % (08/31 0801) Weight:  [58.6 kg (129 lb 3 oz)] 58.6 kg (129 lb 3 oz) (08/30 1720)  PHYSICAL EXAMINATION: General:  Adult female, in distress. Neuro:  Alert, follows commands  HEENT:  MMM, ETT. Cardiovascular:  RRR, no M/R/G. Lungs:  Clear breath sounds, non-labored, no wheeze   Abdomen:  Active bowel sounds, non-tender, non-distended  Musculoskeletal:  -edema  Skin:  Warm, dry, intact .   Recent Labs Lab 07/15/17 0434 07/15/17 2307 07/16/17 0253  NA 140 143 144  K 3.9 3.9 3.6  CL 111 113* 114*  CO2 22 20* 21*  BUN 6 9 11   CREATININE 0.67 0.74 0.81  GLUCOSE 103* 157* 152*    Recent Labs Lab 07/15/17 0434 07/15/17 2307 07/16/17 0253  HGB 15.2* 13.9 13.2  HCT 44.9 43.4 40.8  WBC 9.1 12.8* 11.6*  PLT 345 260 287   Dg Chest 2 View  Result Date: 07/15/2017 CLINICAL DATA:  Shortness  breath for 4 days, chest tightness for 1 hour. History of COPD. EXAM: CHEST  2 VIEW COMPARISON:  Chest radiographs April 22, 2012 FINDINGS: Cardiomediastinal silhouette is normal. Mildly calcified aortic knob. He No pleural effusions or focal consolidations. Mild chronic interstitial changes and increased lung volumes. Trachea projects midline and there is no pneumothorax. Soft tissue planes and included osseous structures are non-suspicious. IMPRESSION: Stable examination:  COPD. Electronically Signed   By: Elon Alas M.D.   On: 07/15/2017 05:31   Portable Chest Xray  Result Date: 07/16/2017 CLINICAL DATA:  Acute hypoxemic respiratory failure. On ventilator. COPD. EXAM: PORTABLE CHEST 1 VIEW COMPARISON:  07/15/2017 FINDINGS: Endotracheal tube and nasogastric tube remain in appropriate position. Pulmonary hyperinflation again seen. Both lungs remain clear. No evidence of pneumothorax or pleural effusion . IMPRESSION: No active lung disease. Electronically Signed   By: Earle Gell M.D.   On: 07/16/2017 07:54   Dg Chest Port 1 View  Result Date: 07/15/2017 CLINICAL DATA:  Ventilator dependent respiratory failure. Repositioning of the endotracheal tube. EXAM: PORTABLE CHEST 1 VIEW 6:41 p.m.: COMPARISON:  Portable chest x-ray earlier today 3:28 p.m. Two-view chest x-ray earlier today 5:04 a.m. and 04/22/2012. FINDINGS: Endotracheal tube tip in satisfactory position projecting approximately 5 cm above the carina. Cardiac silhouette normal in size, unchanged. Thoracic aorta atherosclerotic. Hilar and mediastinal contours otherwise unremarkable.  Lungs hyperinflated but clear. No pneumothorax. No visible pleural effusions. IMPRESSION: 1. Endotracheal tube tip in satisfactory position approximately 5 cm above the carina. 2. Hyperinflation indicating COPD and/or asthma. No acute cardiopulmonary disease. Electronically Signed   By: Evangeline Dakin M.D.   On: 07/15/2017 20:05   Dg Chest Port 1 View  Result  Date: 07/15/2017 CLINICAL DATA:  Respiratory failure. EXAM: PORTABLE CHEST 1 VIEW COMPARISON:  07/15/2017 FINDINGS: The heart size and mediastinal contours are within normal limits. Aortic atherosclerosis without aneurysm. Endotracheal tube tip is slightly low-lying at 1.7 cm above the carina. Pullback an additional 1.5 cm recommended. Gastric tube extends below the left hemidiaphragm. Tip is excluded on this study. Both lungs are clear but slightly hyperinflated. The visualized skeletal structures are unremarkable. IMPRESSION: 1. Slightly low-lying endotracheal tube at 1.7 cm above the carina. Pullback roughly 1.5 cm suggested. 2. Hyperinflated appearing lungs without pneumonic consolidation or CHF. Electronically Signed   By: Ashley Royalty M.D.   On: 07/15/2017 15:48    ASSESSMENT / PLAN:  STUDIES:  CXR 8/30 > COPD Echo 8/30 > EF 40 - 45%, G2DD, PAP 38.  CULTURES: MRSA PCR 8/31 > Negative   ANTIBIOTICS: Azithromycin 8/30 >   SIGNIFICANT EVENTS: 8/30 > admit.  LINES/TUBES: ETT 8/30 >   DISCUSSION: 51 y.o. female admitted 8/30 with NSTEMI and respiratory distress possibly due to COPD exacerbation.  Required intubation in ED to facilitate cardiac catheterization.  ASSESSMENT / PLAN:  PULMONARY A: Acute hypoxic respiratory failure - required intubation while in ED to help facilitate cardiac catheterization. COPD - with possible exacerbation. Tobacco dependence. P:   Full vent support. Wean as able > Currently Weaning 5/5 > Extubate now  Continue high dose steroids. > decreased to 80 mg q6h  Continue BD's and empiric azithromycin. Trend CXR Tobacco cessation counseling.  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 HTN - new diagnosis. P:  Cardiology Following  Hydralazine PRN for maintain systolic <401  Avoid beta blockade given significant AECOPD.  RENAL A:   No acute issues. P:    Trend BMP  GASTROINTESTINAL A:   GI prophylaxis. Nutrition. P:   SUP: Pantoprazole. NPO.  HEMATOLOGIC A:   VTE Prophylaxis. P:  Trend CBC  SCD's  Lovenox SQ   INFECTIOUS A:   AECOPD P:   Trend WBC and Fever Curve  Continue azithromycin.   ENDOCRINE A:   Hyperglycemia secondary to steroids  P:   Trend Glucose  SSI  NEUROLOGIC A:   Acute encephalopathy - due to sedation. P:   Sedation:  Propofol gtt / Fentanyl PRN. RASS goal: 0 to -1. Daily WUA.   Family updated: Family updated at bedside   Interdisciplinary Family Meeting v Palliative Care Meeting:  Due by: 07/21/17.  CC time: 32 min.   Hayden Pedro, AGACNP-BC [facility]  Pgr: [phone]  PCCM Pgr: [phone]

## 2017-07-17 DIAGNOSIS — I5043 Acute on chronic combined systolic (congestive) and diastolic (congestive) heart failure: Secondary | ICD-10-CM

## 2017-07-17 LAB — GLUCOSE, CAPILLARY
GLUCOSE-CAPILLARY: 114 mg/dL — AB (ref 65–99)
Glucose-Capillary: 108 mg/dL — ABNORMAL HIGH (ref 65–99)
Glucose-Capillary: 114 mg/dL — ABNORMAL HIGH (ref 65–99)
Glucose-Capillary: 98 mg/dL (ref 65–99)

## 2017-07-17 LAB — BASIC METABOLIC PANEL
Anion gap: 7 (ref 5–15)
BUN: 15 mg/dL (ref 6–20)
CHLORIDE: 112 mmol/L — AB (ref 101–111)
CO2: 24 mmol/L (ref 22–32)
CREATININE: 0.61 mg/dL (ref 0.44–1.00)
Calcium: 9.2 mg/dL (ref 8.9–10.3)
GFR calc Af Amer: >60 mL/min (ref >60)
GFR calc non Af Amer: >60 mL/min (ref >60)
Glucose, Bld: 108 mg/dL — ABNORMAL HIGH (ref 65–99)
POTASSIUM: 3.9 mmol/L (ref 3.5–5.1)
Sodium: 143 mmol/L (ref 135–145)

## 2017-07-17 LAB — CBC
HEMATOCRIT: 36.4 % (ref 36.0–46.0)
HEMOGLOBIN: 11.9 g/dL — AB (ref 12.0–15.0)
MCH: 28.1 pg (ref 26.0–34.0)
MCHC: 32.7 g/dL (ref 30.0–36.0)
MCV: 85.8 fL (ref 78.0–100.0)
Platelets: 275 10*3/uL (ref 150–400)
RBC: 4.24 MIL/uL (ref 3.87–5.11)
RDW: 15.1 % (ref 11.5–15.5)
WBC: 26.8 10*3/uL — ABNORMAL HIGH (ref 4.0–10.5)

## 2017-07-17 LAB — MAGNESIUM: Magnesium: 2.3 mg/dL (ref 1.7–2.4)

## 2017-07-17 LAB — PHOSPHORUS: PHOSPHORUS: 2.5 mg/dL (ref 2.5–4.6)

## 2017-07-17 MED ORDER — PREDNISONE 20 MG PO TABS
20.0000 mg | ORAL_TABLET | Freq: Every day | ORAL | Status: DC
  Administered 2017-07-18 – 2017-07-19 (×2): 20 mg via ORAL
  Filled 2017-07-17 (×2): qty 1

## 2017-07-17 MED ORDER — AZITHROMYCIN 250 MG PO TABS
250.0000 mg | ORAL_TABLET | Freq: Every day | ORAL | Status: AC
  Administered 2017-07-17 – 2017-07-18 (×2): 250 mg via ORAL
  Filled 2017-07-17 (×2): qty 1

## 2017-07-17 MED ORDER — FUROSEMIDE 20 MG PO TABS
20.0000 mg | ORAL_TABLET | Freq: Every day | ORAL | Status: DC
  Administered 2017-07-17 – 2017-07-19 (×3): 20 mg via ORAL
  Filled 2017-07-17 (×3): qty 1

## 2017-07-17 NOTE — Plan of Care (Signed)
51 yo F w hx of COPD, asthma status post cardiac arrest intubation currently extubated Developed Takatsubo cardiomyopathy cardiology following ( Dr. Stanford Breed) she is doing much better now on telemetry. Still slightly weakened pulmonology has adjusted her nebulizers. Stable to transfer to [facility]  Transfer of care to [facility] on 07/18/2017 Spoke to Dr. Blane Ohara 7:00 PM

## 2017-07-17 NOTE — Progress Notes (Signed)
Progress Note  Patient Name: Caitlin [name] Date of Encounter: 07/17/2017  Primary Cardiologist: Dr Stanford Breed  Subjective   Denies CP or dyspnea  Inpatient Medications    Scheduled Meds: . enoxaparin (LOVENOX) injection  40 mg Subcutaneous Q24H  . feeding supplement (ENSURE ENLIVE)  237 mL Oral BID BM  . insulin aspart  2-6 Units Subcutaneous Q4H  . ipratropium-albuterol  3 mL Nebulization Q6H  . mouth rinse  15 mL Mouth Rinse BID  . methylPREDNISolone (SOLU-MEDROL) injection  80 mg Intravenous Q8H  . metoprolol tartrate  12.5 mg Oral BID  . sodium chloride flush  3 mL Intravenous Q12H   Continuous Infusions: . sodium chloride Stopped (07/16/17 0730)  . azithromycin Stopped (07/16/17 1000)   PRN Meds: acetaminophen, albuterol, ALPRAZolam, hydrALAZINE, ondansetron (ZOFRAN) IV   Vital Signs    Vitals:   07/17/17 0300 07/17/17 0332 07/17/17 0400 07/17/17 0500  BP: (!) 94/59  (!) 96/56 95/64  Pulse: 91  92 78  Resp: (!) 23  (!) 33 15  Temp:  98 F (36.7 C)    TempSrc:  Oral    SpO2: 98%  96% 97%  Weight:      Height:        Intake/Output Summary (Last 24 hours) at 07/17/17 0611 Last data filed at 07/17/17 0000  Gross per 24 hour  Intake          1725.46 ml  Output             1110 ml  Net           615.46 ml   Filed Weights   07/15/17 0432 07/15/17 1720  Weight: 59 kg (130 lb) 58.6 kg (129 lb 3 oz)    Telemetry    Sinus tachycardia- Personally Reviewed   Physical Exam   GEN: WD/thin, NAD Neck: supple Cardiac: regular, tachycardic, no murmur Respiratory: Mild exp wheeze; mildly diminished BS GI: Soft, nontender, non-distended, no masses MS: No edema, radial cath site with no hematoma Neuro:  grossly intact   Labs    Chemistry  Recent Labs Lab 07/15/17 2307 07/16/17 0253 07/17/17 0404  NA 143 144 143  K 3.9 3.6 3.9  CL 113* 114* 112*  CO2 20* 21* 24  GLUCOSE 157* 152* 108*  BUN 9 11 15   CREATININE 0.74 0.81 0.61  CALCIUM 9.1 8.9 9.2    GFRNONAA >60 >60 >60  GFRAA >60 >60 >60  ANIONGAP 10 9 7      Hematology  Recent Labs Lab 07/15/17 0434 07/15/17 2307 07/16/17 0253  WBC 9.1 12.8* 11.6*  RBC 5.25* 4.97 4.67  HGB 15.2* 13.9 13.2  HCT 44.9 43.4 40.8  MCV 85.5 87.3 87.4  MCH 29.0 28.0 28.3  MCHC 33.9 32.0 32.4  RDW 14.2 15.0 14.8  PLT 345 260 287    Cardiac Enzymes  Recent Labs Lab 07/15/17 0913 07/15/17 1441 07/15/17 2302  TROPONINI 0.63* 1.42* 1.53*     Recent Labs Lab 07/15/17 0523 07/15/17 0933 07/15/17 1324  TROPIPOC 0.40* 0.87* 1.46*      Radiology    Portable Chest Xray  Result Date: 07/16/2017 CLINICAL DATA:  Acute hypoxemic respiratory failure. On ventilator. COPD. EXAM: PORTABLE CHEST 1 VIEW COMPARISON:  07/15/2017 FINDINGS: Endotracheal tube and nasogastric tube remain in appropriate position. Pulmonary hyperinflation again seen. Both lungs remain clear. No evidence of pneumothorax or pleural effusion . IMPRESSION: No active lung disease. Electronically Signed   By: Earle Gell M.D.   On:  07/16/2017 07:54   Dg Chest Port 1 View  Result Date: 07/15/2017 CLINICAL DATA:  Ventilator dependent respiratory failure. Repositioning of the endotracheal tube. EXAM: PORTABLE CHEST 1 VIEW 6:41 p.m.: COMPARISON:  Portable chest x-ray earlier today 3:28 p.m. Two-view chest x-ray earlier today 5:04 a.m. and 04/22/2012. FINDINGS: Endotracheal tube tip in satisfactory position projecting approximately 5 cm above the carina. Cardiac silhouette normal in size, unchanged. Thoracic aorta atherosclerotic. Hilar and mediastinal contours otherwise unremarkable. Lungs hyperinflated but clear. No pneumothorax. No visible pleural effusions. IMPRESSION: 1. Endotracheal tube tip in satisfactory position approximately 5 cm above the carina. 2. Hyperinflation indicating COPD and/or asthma. No acute cardiopulmonary disease. Electronically Signed   By: Evangeline Dakin M.D.   On: 07/15/2017 20:05   Dg Chest Port 1  View  Result Date: 07/15/2017 CLINICAL DATA:  Respiratory failure. EXAM: PORTABLE CHEST 1 VIEW COMPARISON:  07/15/2017 FINDINGS: The heart size and mediastinal contours are within normal limits. Aortic atherosclerosis without aneurysm. Endotracheal tube tip is slightly low-lying at 1.7 cm above the carina. Pullback an additional 1.5 cm recommended. Gastric tube extends below the left hemidiaphragm. Tip is excluded on this study. Both lungs are clear but slightly hyperinflated. The visualized skeletal structures are unremarkable. IMPRESSION: 1. Slightly low-lying endotracheal tube at 1.7 cm above the carina. Pullback roughly 1.5 cm suggested. 2. Hyperinflated appearing lungs without pneumonic consolidation or CHF. Electronically Signed   By: Ashley Royalty M.D.   On: 07/15/2017 15:48     Patient Profile     51 year old female with past medical history of COPD, tobacco abuse admitted with COPD, chest tightness and elevated troponin. Echocardiogram demonstrated ejection fraction 40-45% with akinesis of the anteroseptal, anterior, inferoseptal and apical myocardium. Grade 2 diastolic dysfunction. Patient intubated and taken to the catheterization laboratory. Patient had normal coronary arteries. Left ventricular end-diastolic pressure 35.   Assessment & Plan    1 NSTEMI/Takatsubo cardiomyopathy-Patient presented with complaints of dyspnea, COPD flare and troponin mildly elevated. Echocardiogram consistent with takatsubo cardiomyopathy. Cardiac catheterization revealed normal coronary arteries. Pt now extubated and feeling better. Continue low dose metoprolol and change to toprol at DC. Add ARB later if BP allows. Patient will need follow-up echocardiogram in 8-12 weeks to see if LV function has improved.   2 Acute systolic congestive heart failure-left ventricular end-diastolic pressure 35 in cardiac catheterization laboratory. CHF likely exacerbated her COPD. Will begin lasix 20 mg daily and follow renal  function.  3 COPD/URI-I agree with continuing steroids, bronchodilators and antibiotics. Management per pulmonary. Wheezing improving.  3 tobacco abuse-patient previously counseled on discontinuing.  Transfer to telemetry  Signed, Kirk Ruths, MD  07/17/2017, 6:11 AM

## 2017-07-17 NOTE — Progress Notes (Signed)
MD assessed patient; monitor AM labs; continue nebulizer treatment as ordered.  Will continue to monitor.  Clyda Hurdle Rn

## 2017-07-17 NOTE — Progress Notes (Addendum)
[facility] PCCM  S:  Feels better  O: Vitals:   07/17/17 0810 07/17/17 0823  BP: (!) 114/94   Pulse: 88   Resp: 17   Temp:  (!) 97.5 F (36.4 C)  SpO2: 99%    General:  Resting comfortably in bed HENT: NCAT OP clear PULM: Wheezing R base, otherwise normal effort, normal effort CV: RRR, no mgr GI: BS+, soft, nontender MSK: normal bulk and tone Neuro: awake, alert, no distress, MAEW  CBC    Component Value Date/Time   WBC 26.8 (H) 07/17/2017 0404   RBC 4.24 07/17/2017 0404   HGB 11.9 (L) 07/17/2017 0404   HCT 36.4 07/17/2017 0404   PLT 275 07/17/2017 0404   MCV 85.8 07/17/2017 0404   MCH 28.1 07/17/2017 0404   MCHC 32.7 07/17/2017 0404   RDW 15.1 07/17/2017 0404   BMET    Component Value Date/Time   NA 143 07/17/2017 0404   K 3.9 07/17/2017 0404   CL 112 (H) 07/17/2017 0404   CO2 24 07/17/2017 0404   GLUCOSE 108 (H) 07/17/2017 0404   BUN 15 07/17/2017 0404   CREATININE 0.61 07/17/2017 0404   CALCIUM 9.2 07/17/2017 0404   GFRNONAA >60 07/17/2017 0404   GFRAA >60 07/17/2017 0404    Impression: COPD exacerbation Takotsubo cardiomyopathy Cardiac arrest Acute respiratory failure with hypoxemia: better Leukocytosis, new problem, likely due to steroids  Plan: Wean solumedrol to prednisone 20mg  daily x 3 days Repeat CBC Continue azithromycin x2 more days Continue bronchodilators Continue metoprolol as ordered Regular diet Transfer to tele  Will call [facility] for transfer, PCCM off 9/2 AM  Roselie Awkward, MD [facility] P[facility]M Pager: [phone] Cell: [phone] After 3pm or if no response, call [phone]

## 2017-07-17 NOTE — Progress Notes (Signed)
Patient had 2 runs of PSVT, patient was symptomatic; diaphoretic, uncomfortable and dyspneic ,  MD notified; MD will come and assess; Will continue to monitor.  Clyda Hurdle RN

## 2017-07-18 ENCOUNTER — Encounter (HOSPITAL_COMMUNITY): Payer: Self-pay | Admitting: Internal Medicine

## 2017-07-18 DIAGNOSIS — E876 Hypokalemia: Secondary | ICD-10-CM

## 2017-07-18 DIAGNOSIS — F419 Anxiety disorder, unspecified: Secondary | ICD-10-CM

## 2017-07-18 LAB — CBC WITH DIFFERENTIAL/PLATELET
Basophils Absolute: 0 10*3/uL (ref 0.0–0.1)
Basophils Relative: 0 %
Eosinophils Absolute: 0.1 10*3/uL (ref 0.0–0.7)
Eosinophils Relative: 0 %
HEMATOCRIT: 39.1 % (ref 36.0–46.0)
HEMOGLOBIN: 12.9 g/dL (ref 12.0–15.0)
LYMPHS ABS: 5 10*3/uL — AB (ref 0.7–4.0)
LYMPHS PCT: 26 %
MCH: 28.5 pg (ref 26.0–34.0)
MCHC: 33 g/dL (ref 30.0–36.0)
MCV: 86.5 fL (ref 78.0–100.0)
MONOS PCT: 6 %
Monocytes Absolute: 1.1 10*3/uL — ABNORMAL HIGH (ref 0.1–1.0)
NEUTROS PCT: 68 %
Neutro Abs: 13.4 10*3/uL — ABNORMAL HIGH (ref 1.7–7.7)
Platelets: 273 10*3/uL (ref 150–400)
RBC: 4.52 MIL/uL (ref 3.87–5.11)
RDW: 14.9 % (ref 11.5–15.5)
WBC: 19.5 10*3/uL — AB (ref 4.0–10.5)

## 2017-07-18 LAB — GLUCOSE, CAPILLARY
GLUCOSE-CAPILLARY: 85 mg/dL (ref 65–99)
GLUCOSE-CAPILLARY: 87 mg/dL (ref 65–99)
GLUCOSE-CAPILLARY: 96 mg/dL (ref 65–99)
Glucose-Capillary: 106 mg/dL — ABNORMAL HIGH (ref 65–99)
Glucose-Capillary: 78 mg/dL (ref 65–99)
Glucose-Capillary: 109 mg/dL — ABNORMAL HIGH (ref 65–99)
Glucose-Capillary: 141 mg/dL — ABNORMAL HIGH (ref 65–99)

## 2017-07-18 LAB — BASIC METABOLIC PANEL
Anion gap: 7 (ref 5–15)
BUN: 16 mg/dL (ref 6–20)
CALCIUM: 8.9 mg/dL (ref 8.9–10.3)
CO2: 26 mmol/L (ref 22–32)
CREATININE: 0.65 mg/dL (ref 0.44–1.00)
Chloride: 108 mmol/L (ref 101–111)
GFR calc non Af Amer: >60 mL/min (ref >60)
Glucose, Bld: 85 mg/dL (ref 65–99)
Potassium: 3.1 mmol/L — ABNORMAL LOW (ref 3.5–5.1)
SODIUM: 141 mmol/L (ref 135–145)

## 2017-07-18 MED ORDER — MAGNESIUM HYDROXIDE 400 MG/5ML PO SUSP: 30.0000 mL | Freq: Every day | ORAL | Status: DC | PRN

## 2017-07-18 MED ORDER — METOPROLOL SUCCINATE ER 25 MG PO TB24
25.0000 mg | ORAL_TABLET | Freq: Every day | ORAL | Status: DC
  Administered 2017-07-18 – 2017-07-19 (×2): 25 mg via ORAL
  Filled 2017-07-18 (×2): qty 1

## 2017-07-18 MED ORDER — POTASSIUM CHLORIDE 10 MEQ/100ML IV SOLN: 10.0000 meq | INTRAVENOUS | Status: DC

## 2017-07-18 MED ORDER — LOSARTAN POTASSIUM 25 MG PO TABS
25.0000 mg | ORAL_TABLET | Freq: Every day | ORAL | Status: DC
  Administered 2017-07-18 – 2017-07-19 (×2): 25 mg via ORAL
  Filled 2017-07-18 (×2): qty 1

## 2017-07-18 MED ORDER — ALUM & MAG HYDROXIDE-SIMETH 200-200-20 MG/5ML PO SUSP
30.0000 mL | ORAL | Status: DC | PRN
  Filled 2017-07-18: qty 30

## 2017-07-18 MED ORDER — POTASSIUM CHLORIDE CRYS ER 20 MEQ PO TBCR
40.0000 meq | EXTENDED_RELEASE_TABLET | Freq: Once | ORAL | Status: AC
  Administered 2017-07-18: 40 meq via ORAL
  Filled 2017-07-18: qty 2

## 2017-07-18 NOTE — Progress Notes (Signed)
Transfer Note:  Patient transferred to primary team following extubation and transfer from ICU.   Subjective: Patient was originally seen and evaluated by IMTS team on August 30. At that time she was intubated due to inability to lie flat for cardiac cath procedure secondary to respiratory distress. Echocardiogram was performed that indicated left ventricular ejection fraction of 40-45% and akinesis of the mid anteroseptal, anterior, anteroseptal and apical myocardial walls. Cardiology was consult to treat the patient for a NSTEMI she had markedly elevated troponins without significant EKG changes other than T wave flattening and anterior Q waves. She was electively intubated prior to cardiac cath which was unremarkable except for elevated left ventricular end-diastolic pressure of 35 mmHg. She was diagnosed with acute on chronic systolic and diastolic heart failure exacerbation at that time. She had initially been admitted for treatment of possible COPD exacerbation due to her history of COPD, respiratory status and exam at that time.  Patient was extubated 9/1, stated that she was feeling much better. Cardiology note indicated that the echocardiogram was consistent with takatsubo cardiomyopathy recommending patient received follow-up echocardiogram in 8-12 weeks for evaluation of left ventricular function. In addition cardiology recommended she begin furosemide 20 mg daily as her COPD exacerbation was most likely secondary to CHF. Patient was transferred to the floor and care of internal medicine during service team.  Patient states today she feels much better, but is still short of breath when attempting to ambulate to the restroom. She denied headache, visual changes, nausea, vomiting, diarrhea, constipation, chest pain, muscle aches, fever, chills, or any other concerning pain or symptoms. Patient was advised that she will treated by our team until she is stable for discharge. Patient in agreement  with this plan.  Objective:  Vital signs in last 24 hours: Vitals:   07/17/17 2227 07/18/17 0111 07/18/17 0500 07/18/17 0718  BP: (!) 121/91 (!) 125/97  112/75  Pulse: 74 77  (!) 103  Resp: 20 20  18   Temp: 97.9 F (36.6 C) 97.6 F (36.4 C)  97.7 F (36.5 C)  TempSrc: Oral Oral  Oral  SpO2: 100% 97%  99%  Weight:   126 lb 3.2 oz (57.2 kg)   Height:       Review of Systems  Constitutional: Negative for chills, diaphoresis and fever.  HENT: Negative for ear pain and sinus pain.   Eyes: Negative for blurred vision, photophobia and redness.  Respiratory: Positive for shortness of breath and wheezing. Negative for cough and sputum production.   Cardiovascular: Negative for chest pain, palpitations and leg swelling.  Gastrointestinal: Negative for constipation, diarrhea, nausea and vomiting.  Genitourinary: Negative for flank pain and urgency.  Musculoskeletal: Negative for myalgias.  Neurological: Negative for dizziness and headaches.  Psychiatric/Behavioral: The patient is not nervous/anxious.    Physical Exam  Constitutional: She is oriented to person, place, and time. She appears well-developed and well-nourished. No distress.  HENT:  Head: Normocephalic and atraumatic.  Eyes: Conjunctivae and EOM are normal.  Neck: Normal range of motion. Neck supple.  Cardiovascular: Normal rate, regular rhythm and intact distal pulses.   Pulmonary/Chest: Effort normal. No stridor. No respiratory distress. She has wheezes (Bilateral lower lung fields predominantly).  Abdominal: Soft. Bowel sounds are normal. She exhibits no distension. There is no tenderness.  Musculoskeletal: She exhibits no edema or tenderness.  Neurological: She is alert and oriented to person, place, and time.  Skin: Skin is warm. Capillary refill takes less than 2 seconds. She is  not diaphoretic.  Psychiatric: She has a normal mood and affect. Her behavior is normal.    Assessment/Plan:  Principal Problem:    Acute on chronic combined systolic and diastolic heart failure (HCC) Active Problems:   COPD with acute exacerbation (HCC)   NSTEMI (non-ST elevated myocardial infarction) (HCC)   Tobacco use   Type 2 myocardial infarction [facility])  Acute on Chronic Systolic and diastolic heart failure, NSTEMI: -Cardiology following patient -Cardiology indicated probable Takatsubo cardiomyopathy, recommend follow-up echo in 8-12 weeks- New-onset acute systolic congestive heart failure with left ventricular end-diastolic pressure of 35 -patient started on Lasix 20 mg daily -Losartan 25mg  therapy initiated -Metoprolol succinate 25mg  therapy initiated   Hypokalemia: -Potassium 3.1 today, -Repleted with  PPO 40mg  potassium by Dr. Stanford Breed -Will check BMP in am for improvement and recommendation from there   COPD exacerbation: -Continue prednisone 20 mg daily -continue albuterol nebulizer q2 hours PRN -azithromycin 250 given complete  Anxiety: -Xanax 0.5mg  PO  Diet: Regular Code: Full IV Fluids: None DVT PPx: enoxaparin  Dispo: Anticipated discharge in approximately 1-2 day(s).   Kathi Ludwig, MD 07/18/2017, 11:16 AM Pager: Pager# [phone]

## 2017-07-18 NOTE — Progress Notes (Signed)
Progress Note  Patient Name: Caitlin [name] Date of Encounter: 07/18/2017  Primary Cardiologist: Dr Stanford Breed  Subjective   Mild DOE; no chest pain  Inpatient Medications    Scheduled Meds: . enoxaparin (LOVENOX) injection  40 mg Subcutaneous Q24H  . feeding supplement (ENSURE ENLIVE)  237 mL Oral BID BM  . furosemide  20 mg Oral Daily  . insulin aspart  2-6 Units Subcutaneous Q4H  . mouth rinse  15 mL Mouth Rinse BID  . metoprolol tartrate  12.5 mg Oral BID  . predniSONE  20 mg Oral Q breakfast  . sodium chloride flush  3 mL Intravenous Q12H   Continuous Infusions:  PRN Meds: acetaminophen, albuterol, ALPRAZolam, alum & mag hydroxide-simeth, magnesium hydroxide, ondansetron (ZOFRAN) IV   Vital Signs    Vitals:   07/17/17 2227 07/18/17 0111 07/18/17 0500 07/18/17 0718  BP: (!) 121/91 (!) 125/97  112/75  Pulse: 74 77  (!) 103  Resp: 20 20  18   Temp: 97.9 F (36.6 C) 97.6 F (36.4 C)  97.7 F (36.5 C)  TempSrc: Oral Oral  Oral  SpO2: 100% 97%  99%  Weight:   57.2 kg (126 lb 3.2 oz)   Height:        Intake/Output Summary (Last 24 hours) at 07/18/17 1129 Last data filed at 07/18/17 0913  Gross per 24 hour  Intake              960 ml  Output                0 ml  Net              960 ml   Filed Weights   07/15/17 1720 07/17/17 1700 07/18/17 0500  Weight: 58.6 kg (129 lb 3 oz) 57.8 kg (127 lb 6.4 oz) 57.2 kg (126 lb 3.2 oz)    Telemetry    Sinus- Personally Reviewed   Physical Exam   GEN: WD/thin, NAD Neck: supple, no JVD Cardiac: RRR Respiratory: Mild exp wheeze; diminished BS throughout GI: Soft, NT/ND MS: No edema, radial cath site with no hematoma, no deformity Neuro:  no focal findings   Labs    Chemistry  Recent Labs Lab 07/16/17 0253 07/17/17 0404 07/18/17 0659  NA 144 143 141  K 3.6 3.9 3.1*  CL 114* 112* 108  CO2 21* 24 26  GLUCOSE 152* 108* 85  BUN 11 15 16   CREATININE 0.81 0.61 0.65  CALCIUM 8.9 9.2 8.9  GFRNONAA >60 >60  >60  GFRAA >60 >60 >60  ANIONGAP 9 7 7      Hematology  Recent Labs Lab 07/16/17 0253 07/17/17 0404 07/18/17 0659  WBC 11.6* 26.8* 19.5*  RBC 4.67 4.24 4.52  HGB 13.2 11.9* 12.9  HCT 40.8 36.4 39.1  MCV 87.4 85.8 86.5  MCH 28.3 28.1 28.5  MCHC 32.4 32.7 33.0  RDW 14.8 15.1 14.9  PLT 287 275 273    Cardiac Enzymes  Recent Labs Lab 07/15/17 0913 07/15/17 1441 07/15/17 2302  TROPONINI 0.63* 1.42* 1.53*     Recent Labs Lab 07/15/17 0523 07/15/17 0933 07/15/17 1324  TROPIPOC 0.40* 0.87* 1.46*       Patient Profile     51 year old female with past medical history of COPD, tobacco abuse admitted with COPD, chest tightness and elevated troponin. Echocardiogram demonstrated ejection fraction 40-45% with akinesis of the anteroseptal, anterior, inferoseptal and apical myocardium. Grade 2 diastolic dysfunction. Patient intubated and taken to the catheterization laboratory. Patient  had normal coronary arteries. Left ventricular end-diastolic pressure 35.   Assessment & Plan    1 NSTEMI/Takatsubo cardiomyopathy-Patient presented with complaints of dyspnea, COPD flare and troponin mildly elevated. Echocardiogram consistent with takatsubo cardiomyopathy. Cardiac catheterization revealed normal coronary arteries. Change metoprolol to toprol 25 mg daily. Add cozaar 25 mg daily. Patient will need follow-up echocardiogram in 8-12 weeks to see if LV function has improved.   2 Acute systolic congestive heart failure-left ventricular end-diastolic pressure 35 in cardiac catheterization laboratory. CHF likely exacerbated her COPD. Continue lasix 20 mg daily; add Kdur 10 meq daily at DC.  3 COPD/URI-I agree with continuing steroids, bronchodilators and antibiotics. Management per pulmonary. Wheezing improving.  3 tobacco abuse-patient previously counseled on discontinuing.  4 hypokalemia-supplement  Pt can be DCed in AM from a cardiac standpoint and FU with me in 8-12 weeks; would  check bmet one week.  Signed, Kirk Ruths, MD  07/18/2017, 11:29 AM

## 2017-07-19 LAB — BASIC METABOLIC PANEL
Anion gap: 8 (ref 5–15)
BUN: 13 mg/dL (ref 6–20)
CHLORIDE: 103 mmol/L (ref 101–111)
CO2: 28 mmol/L (ref 22–32)
Calcium: 9.3 mg/dL (ref 8.9–10.3)
Creatinine, Ser: 0.78 mg/dL (ref 0.44–1.00)
GFR calc Af Amer: >60 mL/min (ref >60)
GFR calc non Af Amer: >60 mL/min (ref >60)
Glucose, Bld: 71 mg/dL (ref 65–99)
POTASSIUM: 3.9 mmol/L (ref 3.5–5.1)
SODIUM: 139 mmol/L (ref 135–145)

## 2017-07-19 LAB — CBC
HCT: 47.2 % — ABNORMAL HIGH (ref 36.0–46.0)
HEMOGLOBIN: 15.9 g/dL — AB (ref 12.0–15.0)
MCH: 29.2 pg (ref 26.0–34.0)
MCHC: 33.7 g/dL (ref 30.0–36.0)
MCV: 86.8 fL (ref 78.0–100.0)
Platelets: 299 10*3/uL (ref 150–400)
RBC: 5.44 MIL/uL — AB (ref 3.87–5.11)
RDW: 14.5 % (ref 11.5–15.5)
WBC: 15.6 10*3/uL — ABNORMAL HIGH (ref 4.0–10.5)

## 2017-07-19 LAB — GLUCOSE, CAPILLARY
GLUCOSE-CAPILLARY: 100 mg/dL — AB (ref 65–99)
GLUCOSE-CAPILLARY: 107 mg/dL — AB (ref 65–99)
GLUCOSE-CAPILLARY: 75 mg/dL (ref 65–99)
GLUCOSE-CAPILLARY: 80 mg/dL (ref 65–99)

## 2017-07-19 MED ORDER — LISINOPRIL 10 MG PO TABS: 10.0000 mg | ORAL_TABLET | Freq: Every day | ORAL | 0 refills | Status: DC

## 2017-07-19 MED ORDER — LOSARTAN POTASSIUM 25 MG PO TABS: 25.0000 mg | ORAL_TABLET | Freq: Every day | ORAL | 0 refills | Status: DC

## 2017-07-19 MED ORDER — METOPROLOL SUCCINATE ER 25 MG PO TB24: 25.0000 mg | ORAL_TABLET | Freq: Every day | ORAL | 0 refills | Status: DC

## 2017-07-19 MED ORDER — FUROSEMIDE 20 MG PO TABS: 20.0000 mg | ORAL_TABLET | Freq: Every day | ORAL | 0 refills | Status: DC

## 2017-07-19 NOTE — Progress Notes (Signed)
   Subjective:  The patient was sitting up in her bed watching TV during the room. She stated that she was much better today and desired to return home. Patient said that she will be moving in with her sister until she recovers significantly. Patient states that breakfast was good, she denied significant shortness of breath. Patient continues to be concerned with shortness of breath with ambulating to the the restroom but improving day. Patient denied headache, visual changes, nausea, vomiting, diarrhea, constipation, abdominal pain, chest pain, or other concerning symptoms at this time.  Objective:  Vital signs in last 24 hours: Vitals:   07/18/17 0718 07/18/17 1300 07/18/17 2053 07/19/17 0617  BP: 112/75 122/82 (!) 125/98 (!) 129/92  Pulse: (!) 103 78 83 72  Resp: 18 18 18 18   Temp: 97.7 F (36.5 C) 98 F (36.7 C) 97.6 F (36.4 C) (!) 97.4 F (36.3 C)  TempSrc: Oral Oral Oral Oral  SpO2: 99% 100% 99% 96%  Weight:    123 lb 4.8 oz (55.9 kg)  Height:       ROS negative except as per history of present illness.  Physical Exam  Constitutional: She is oriented to person, place, and time. She appears well-developed and well-nourished. No distress.  HENT:  Head: Normocephalic and atraumatic.  Eyes: Conjunctivae and EOM are normal.  Neck: Normal range of motion. Neck supple.  Cardiovascular: Normal rate and regular rhythm.   No murmur heard. Pulmonary/Chest: Effort normal. No stridor. No respiratory distress. She has wheezes (Very mild bilateral lung fields).  Abdominal: Soft. Bowel sounds are normal. She exhibits no distension. There is no tenderness.  Musculoskeletal: She exhibits no edema or tenderness.  Neurological: She is alert and oriented to person, place, and time.  Skin: Skin is warm. Capillary refill takes less than 2 seconds. She is not diaphoretic.  Psychiatric: She has a normal mood and affect.     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, recommend follow-up echo in 8-12 weeks- New-onset acute systolic congestive heart failure with left ventricular end-diastolic pressure of 35 -patient started on Lasix 20 mg -Losartan 25mg  therapy initiated, will be switched to lisinopril 10mg  on discharge due to financial cost -Metoprolol succinate 25mg  therapy initiated to be continued on discharge  Hypokalemia: -Potassium 3.9 today -Will check BMP in one week from discharge  COPD exacerbation: -Continue prednisone 20 mg daily -continue albuterol nebulizer q2 hours PRN -azithromycin 250 given complete  Anxiety: -Xanax 0.5mg  PO  Diet: Regular Code: Full IV Fluids: None DVT PPx: enoxaparin   Dispo: Anticipated discharge in approximately today.   Kathi Ludwig, MD 07/19/2017, 10:10 AM Pager: Pager# [phone]

## 2017-07-19 NOTE — Discharge Instructions (Signed)
Please follow-up with the number listed below or the number on the card that was given to you for an appointment within a week from today. Please notify the clinic when you call that you are a new patient, and if there are any questions that they may contact Dr. Berline Lopes or Dr. Lynnae January.  Please continue to take you medications as prescribed.  Below is the information regarding the [facility] card, the discount program we discussed in your room today.  Also, if at any time you develop the symptoms listed on this paperwork such as shortness of breath, weakness, please call our clinic or go to the ED if they are overly concerning.

## 2017-07-19 NOTE — Discharge Summary (Signed)
Name: Caitlin [name] MRN: [id number] DOB: Mar 25, 1966 51 y.o. PCP: System, Pcp Not In  Date of Admission: 07/15/2017  5:40 AM Date of Discharge: 07/20/2017 Attending Physician: No att. 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   lisinopril 10 MG tablet Commonly known as:  PRINIVIL,ZESTRIL Take 1 tablet (10 mg total) by mouth daily.   metoprolol succinate 25 MG 24 hr tablet Commonly known as:  TOPROL-XL Take 1 tablet (25 mg total) by mouth daily.   vitamin B-12 100 MCG tablet Commonly known as:  CYANOCOBALAMIN Take 100 mcg by mouth daily.            Discharge Care Instructions        Start     Ordered   07/19/17 0000  Increase activity slowly     07/19/17 1012   07/19/17 0000  Diet - low sodium heart healthy     07/19/17 1012   07/19/17 0000  Call MD for:  extreme fatigue     07/19/17 1012   07/19/17 0000  Call MD for:  persistant dizziness or light-headedness     07/19/17 1012   07/19/17 0000  (HEART FAILURE PATIENTS) Call MD:  Anytime you have any of the following symptoms: 1) 3 pound weight gain in 24 hours or 5 pounds in 1 week 2) shortness of breath, with or without a dry hacking cough 3) swelling in the hands, feet or stomach 4) if you have to sleep on extra pillows at night in order to breathe.     07/19/17 1012   07/19/17 0000  furosemide (LASIX) 20 MG tablet  Daily     07/19/17 1326   07/19/17 0000  lisinopril (PRINIVIL,ZESTRIL) 10 MG tablet  Daily     07/19/17 1326   07/19/17 0000  metoprolol succinate (TOPROL-XL) 25 MG 24 hr tablet   Daily     07/19/17 1326      Disposition and follow-up:   [name] was discharged from [facility] in Good condition.  At the hospital follow up visit please address:  1.  Hypokalemia, shortness of breath, weakness secondary to new CHF, improvement of her SOB and weakness, Schedule for follow-up Echocardiogram in 8-12 weeks as per cardiology recommendation secondary to what is thought to be takotsubo cardiomyopathy.   2.  Labs / imaging needed at time of follow-up: BMET for potassium, other indicated labs at that time, ECHO during the month of December  3.  Pending labs/ test needing follow-up: n/a  Follow-up Appointments: Follow-up [place] Follow up in 1 week(s).   Why:  Please call and schedule for early next week. It is important that you be seen on Monday or Tuesday as you will need follow-up labs completed at that time. In addition, please bring with you the information needed for the [facility] at that time. Contact information: [address] [place] [facility] Course by problem  list: Principal Problem:   Acute combined systolic and diastolic heart failure (HCC) Active Problems:   COPD with acute exacerbation ([place])   1. Acute combined systolic and diastolic heart failure/NSTEMI He patient was noted to have an left ventricular end-diastolic pressure of 35 mmHg during a heart catheterization for NSTEMI as observed with increased troponins. Patient initially presented for consideration of COPD exacerbation, however the troponins returned possible for concern of NSTEMI occurred, prompting cardiac cardiology consultation. Elevated troponins in the setting of an EKG indicating possible anterior septal infarct/undetermined.This resulted in the patient being electively intubated and she is unable to lie flat for the procedure. During the procedure it was noted that  there was of the mid anteroseptal, anterior, anteroseptal and apical myocardial walls. Echocardiogram was performed which indicated an ejection fraction of 40-45%, initial diagnosis is Takotsubo Cardiomyopathy over NSTEMI. Patient progressed and was extubated following two days of intubation. She is rapidly stabilized and transferred to the floor with the internal medicine trading service. Patient was comfortable at rest. Able to ambulate and saturating well on room air. She was monitored for 24 hours before being discharged to self-care. Patient stated that she will be staying with her sister and establish until she regained her strength. Patient was advised to continue taking her metoprolol, lisinopril, furosemide until told otherwise by her PCP or cardiologist. Patient was advised to follow-up with echocardiogram in 8-12 weeks from discharge.   2. COPD exacerbation: Due to the patient's chest tightness and pain with deep inspiration she was thought to be undergoing a COPD exacerbation initially. However, given her presentation of MI, no PFTs on record, lack of increased sputum production, it is unlikely she was undergoing COPD exacerbation. Given her presentation of possible COPD exacerbation, she was initially treated for this and as such treatment was continued with prednisone and azithromycin until discharge. She was discharged, with generalized weakness and improving shortness of breath that accompanies ambulation. Patient was advised to follow-up with PCP as outpatient was given the number for the [facility] internal medicine clinic.  As always patient was advised to call the clinic or return to the ED she developed concerning signs or symptoms, such as not limited to, increased shortness of breath, chest pain, dizziness, weakness, weight gain, bilateral lower extremity edema, or other concerning symptoms.  3. Hypokalemia: Patient was noted to be hypokalemic on day 3, which was repleted by her  cardiologist with PO potassium. She was advised to follow with her PCP for evaluation of her hypokalemia as an outpatient in one week.  Discharge Vitals:   BP (!) 143/94   Pulse 97   Temp (!) 97.4 F (36.3 C) (Oral)   Resp 18   Ht 5\' 9"  (1.753 m)   Wt 123 lb 4.8 oz (55.9 kg)   LMP 09/09/2014 (Approximate)   SpO2 96%   BMI 18.21 kg/m   Pertinent Labs, Studies, and Procedures:  BMP Latest Ref Rng & Units 07/19/2017 07/18/2017 07/17/2017  Glucose 65 - 99 mg/dL 71 85 108(H)  BUN 6 - 20 mg/dL 13 16 15   Creatinine 0.44 - 1.00 mg/dL 0.78 0.65 0.61  Sodium 135 - 145 mmol/L 139 141 143  Potassium 3.5 - 5.1 mmol/L 3.9 3.1(L) 3.9  Chloride 101 - 111 mmol/L 103 108 112(H)  CO2 22 - 32 mmol/L 28 26 24   Calcium 8.9 - 10.3 mg/dL 9.3 8.9 9.2     Discharge Instructions: Discharge Instructions    (HEART FAILURE PATIENTS) Call MD:  Anytime you have  any of the following symptoms: 1) 3 pound weight gain in 24 hours or 5 pounds in 1 week 2) shortness of breath, with or without a dry hacking cough 3) swelling in the hands, feet or stomach 4) if you have to sleep on extra pillows at night in order to breathe.    Complete by:  As directed    Call MD for:  extreme fatigue    Complete by:  As directed    Call MD for:  persistant dizziness or light-headedness    Complete by:  As directed    Diet - low sodium heart healthy    Complete by:  As directed    Increase activity slowly    Complete by:  As directed       Signed: Kathi Ludwig, MD 07/20/2017, 11:07 AM   Pager: Pager# [phone]

## 2017-07-19 NOTE — Progress Notes (Signed)
Progress Note  Patient Name: Caitlin [name] Date of Encounter: 07/19/2017  Primary Cardiologist: Dr Stanford Breed  Subjective   Mild fatigue; no dyspnea or chest pain  Inpatient Medications    Scheduled Meds: . enoxaparin (LOVENOX) injection  40 mg Subcutaneous Q24H  . feeding supplement (ENSURE ENLIVE)  237 mL Oral BID BM  . furosemide  20 mg Oral Daily  . insulin aspart  2-6 Units Subcutaneous Q4H  . losartan  25 mg Oral Daily  . mouth rinse  15 mL Mouth Rinse BID  . metoprolol succinate  25 mg Oral Daily  . predniSONE  20 mg Oral Q breakfast  . sodium chloride flush  3 mL Intravenous Q12H   Continuous Infusions:  PRN Meds: acetaminophen, albuterol, ALPRAZolam, alum & mag hydroxide-simeth, magnesium hydroxide, ondansetron (ZOFRAN) IV   Vital Signs    Vitals:   07/18/17 0718 07/18/17 1300 07/18/17 2053 07/19/17 0617  BP: 112/75 122/82 (!) 125/98 (!) 129/92  Pulse: (!) 103 78 83 72  Resp: 18 18 18 18   Temp: 97.7 F (36.5 C) 98 F (36.7 C) 97.6 F (36.4 C) (!) 97.4 F (36.3 C)  TempSrc: Oral Oral Oral Oral  SpO2: 99% 100% 99% 96%  Weight:    55.9 kg (123 lb 4.8 oz)  Height:        Intake/Output Summary (Last 24 hours) at 07/19/17 0743 Last data filed at 07/18/17 0913  Gross per 24 hour  Intake              240 ml  Output                0 ml  Net              240 ml   Filed Weights   07/17/17 1700 07/18/17 0500 07/19/17 0617  Weight: 57.8 kg (127 lb 6.4 oz) 57.2 kg (126 lb 3.2 oz) 55.9 kg (123 lb 4.8 oz)    Telemetry    Sinus- Personally Reviewed   Physical Exam   GEN: WD/WN/thin, NAD Neck: No JVD Cardiac: RRR, no mumur Respiratory: diminished BS; no murmur GI: Soft, NT/ND, no masses MS: No edema Neuro:  grossly intact   Labs    Chemistry  Recent Labs Lab 07/17/17 0404 07/18/17 0659 07/19/17 0403  NA 143 141 139  K 3.9 3.1* 3.9  CL 112* 108 103  CO2 24 26 28   GLUCOSE 108* 85 71  BUN 15 16 13   CREATININE 0.61 0.65 0.78  CALCIUM 9.2  8.9 9.3  GFRNONAA >60 >60 >60  GFRAA >60 >60 >60  ANIONGAP 7 7 8      Hematology  Recent Labs Lab 07/17/17 0404 07/18/17 0659 07/19/17 0403  WBC 26.8* 19.5* 15.6*  RBC 4.24 4.52 5.44*  HGB 11.9* 12.9 15.9*  HCT 36.4 39.1 47.2*  MCV 85.8 86.5 86.8  MCH 28.1 28.5 29.2  MCHC 32.7 33.0 33.7  RDW 15.1 14.9 14.5  PLT 275 273 299    Cardiac Enzymes  Recent Labs Lab 07/15/17 0913 07/15/17 1441 07/15/17 2302  TROPONINI 0.63* 1.42* 1.53*     Recent Labs Lab 07/15/17 0523 07/15/17 0933 07/15/17 1324  TROPIPOC 0.40* 0.87* 1.46*       Patient Profile     51 year old female with past medical history of COPD, tobacco abuse admitted with COPD, chest tightness and elevated troponin. Echocardiogram demonstrated ejection fraction 40-45% with akinesis of the anteroseptal, anterior, inferoseptal and apical myocardium. Grade 2 diastolic dysfunction. Patient intubated and  taken to the catheterization laboratory. Patient had normal coronary arteries. Left ventricular end-diastolic pressure 35.   Assessment & Plan    1 NSTEMI/Takatsubo cardiomyopathy-Patient presented with complaints of dyspnea, COPD flare and troponin mildly elevated. Echocardiogram consistent with takatsubo cardiomyopathy. Cardiac catheterization revealed normal coronary arteries. Continue present dose of cozaar and toprol. Patient will need follow-up echocardiogram in 8-12 weeks to see if LV function has improved.   2 Acute systolic congestive heart failure-left ventricular end-diastolic pressure 35 in cardiac catheterization laboratory. CHF likely exacerbated her COPD. Continue lasix 20 mg daily and kcl 10 meq daily at DC.   3 COPD/URI-improved; management per primary care.  3 tobacco abuse-patient counseled on discontinuing.  Pt can be DCed from a cardiac standpoint and FU with me in 8-12 weeks; would check bmet one week after DC.  Signed, Kirk Ruths, MD  07/19/2017, 7:43 AM

## 2017-07-20 LAB — GLUCOSE, CAPILLARY: Glucose-Capillary: 112 mg/dL — ABNORMAL HIGH (ref 65–99)

## 2017-07-22 ENCOUNTER — Ambulatory Visit (INDEPENDENT_AMBULATORY_CARE_PROVIDER_SITE_OTHER): Payer: Self-pay | Admitting: Internal Medicine

## 2017-07-22 VITALS — BP 104/70 | HR 71 | Temp 97.6°F | Ht 69.0 in | Wt 123.2 lb

## 2017-07-22 DIAGNOSIS — J441 Chronic obstructive pulmonary disease with (acute) exacerbation: Secondary | ICD-10-CM

## 2017-07-22 DIAGNOSIS — J449 Chronic obstructive pulmonary disease, unspecified: Secondary | ICD-10-CM

## 2017-07-22 DIAGNOSIS — I5041 Acute combined systolic (congestive) and diastolic (congestive) heart failure: Secondary | ICD-10-CM

## 2017-07-22 DIAGNOSIS — F17210 Nicotine dependence, cigarettes, uncomplicated: Secondary | ICD-10-CM

## 2017-07-22 DIAGNOSIS — Z720 Tobacco use: Secondary | ICD-10-CM

## 2017-07-22 NOTE — Assessment & Plan Note (Addendum)
During her recent hospitalization, her initial presentation was thought to be due to possible COPD exacerbation. However, based on the course of the remainder of her hospitalization it seemed her symptoms were likely related to her acute onset heart failure. She was treated with prednisone and azithromycin until discharge. At present, patient denies having any wheezing. She does report dyspnea on exertion and an occasional cough productive of clear/yellow sputum which could very well be related to her poor cardiac function. Patient reports having a 30-pack-year smoking history; recently cut down to 8 cigarettes per day. States she has not smoked cigarettes since her recent hospitalization. I discussed pharmacotherapy for smoking cessation but patient declined stating she does not want any medications or patches and believes she will be able to continue to refrain from cigarette smoking on her own.   Plan -She will need PFTs to establish a diagnosis of COPD. Patient is currently uninsured and will need a referral when she receives her orange card.Marland Kitchen

## 2017-07-22 NOTE — Patient Instructions (Addendum)
Ms. Mcelrath it was nice meeting you today.  -Continue taking furosemide, lisinopril, and metoprolol as instructed.  -You will need a repeat echocardiogram of your heart in 2-3 months. A referral has been placed.  -Please call the clinic and inform us when you receive your [facility] card so that referral to cardiology can be placed. In addition, we can order pulmonary function tests at that time.

## 2017-07-22 NOTE — Assessment & Plan Note (Signed)
Patient reports having a 30-pack-year smoking history; recently cut down to 8 cigarettes per day. States she has not smoked cigarettes since her recent hospitalization. I discussed pharmacotherapy for smoking cessation but patient declined stating she does not want any medications or patches and believes she will be able to continue to refrain from cigarette smoking on her own.

## 2017-07-22 NOTE — Assessment & Plan Note (Addendum)
Patient was recently admitted to the hospital from 07/15/2017 to 07/20/2017 for acute combined systolic and diastolic heart failure in the setting of Takotsubo cardiomyopathy/ NSTEMI. She was initially intubated and admitted to the ICU. She had a mild troponin elevation. Cardiac cath revealed normal coronary arteries. Left ventricular end diastolic pressure measured at 35 mmHg during cardiac cath. Echo revealed an ejection fraction of 40-45%. Patient was started on metoprolol 25 mg daily, lisinopril 10 mg daily, and furosemide 20 mg daily for her heart failure. At present, she continues to experience fatigue. States she will be living with her sister until she is able to regain her strength back. Continues to experience dyspnea on exertion and occasional cough productive of clear/yellow sputum. Denies having any peripheral edema. Blood pressure 104/70 and pulse 71 at this visit. She is euvolemic on exam.  Plan -Continue current management -Patient is currently uninsured and will be speaking to [facility] today about an orange card. She will need a referral to cardiology once she receives her orange card. -She will also need a repeat echocardiogram in 8-12 weeks. Referral has been placed. -Check BMP to assess renal function and electrolyte status  Addendum: Cr 0.7 and electrolytes normal. Tried calling but could not reach the patient over the phone.

## 2017-07-22 NOTE — Addendum Note (Signed)
Addended by: Shela Leff on: 07/22/2017 05:04 PM   Modules accepted: Level of Service

## 2017-07-22 NOTE — Progress Notes (Signed)
   CC: Patient is here for a hospital follow-up of recently diagnosed heart failure and copd exacerbation.  HPI:  Caitlin [name] is a 51 y.o. female with a past medical history of conditions listed below presenting to the clinic for a hospital follow-up of recently diagnosed heart failure and COPD exacerbation. Please see problem based charting for the status of the patient's current and chronic medical conditions.   Past Medical History:  Diagnosis Date  . COPD (chronic obstructive pulmonary disease) ([place])    Review of Systems: Pertinent positives mentioned in HPI. Remainder of all ROS negative.   Physical Exam:  Vitals:   07/22/17 1345  BP: 104/70  Pulse: 71  Temp: 97.6 F (36.4 C)  TempSrc: Oral  SpO2: 100%  Weight: 123 lb 3.2 oz (55.9 kg)  Height: 5\' 9"  (1.753 m)   Physical Exam  Constitutional: She is oriented to person, place, and time. She appears well-developed and well-nourished. No distress.  HENT:  Head: Normocephalic and atraumatic.  Mouth/Throat: Oropharynx is clear and moist.  Eyes: Right eye exhibits no discharge. Left eye exhibits no discharge.  Cardiovascular: Normal rate, regular rhythm and intact distal pulses.   Pulmonary/Chest: Effort normal and breath sounds normal. No respiratory distress. She has no wheezes. She has no rales.  Abdominal: Soft. Bowel sounds are normal. She exhibits no distension. There is no tenderness.  Musculoskeletal: She exhibits no edema.  Neurological: She is alert and oriented to person, place, and time.  Skin: Skin is warm and dry.  Psychiatric: Her behavior is normal.    Assessment & Plan:   See Encounters Tab for problem based charting.  Patient discussed with Dr. Daryll Drown

## 2017-07-23 LAB — BMP8+ANION GAP
Anion Gap: 15 mmol/L (ref 10.0–18.0)
BUN/Creatinine Ratio: 31 — ABNORMAL HIGH (ref 9–23)
BUN: 24 mg/dL (ref 6–24)
CO2: 24 mmol/L (ref 20–29)
Calcium: 9.4 mg/dL (ref 8.7–10.2)
Chloride: 100 mmol/L (ref 96–106)
Creatinine, Ser: 0.77 mg/dL (ref 0.57–1.00)
GFR calc Af Amer: 102 mL/min/{1.73_m2} (ref >59)
GFR, EST NON AFRICAN AMERICAN: 88 mL/min/{1.73_m2} (ref >59)
GLUCOSE: 96 mg/dL (ref 65–99)
POTASSIUM: 4.5 mmol/L (ref 3.5–5.2)
Sodium: 139 mmol/L (ref 134–144)

## 2017-07-26 NOTE — Progress Notes (Signed)
Internal Medicine Clinic Attending  Case discussed with Dr. Rathoreat the time of the visit. We reviewed the resident's history and exam and pertinent patient test results. I agree with the assessment, diagnosis, and plan of care documented in the resident's note.  

## 2017-07-30 ENCOUNTER — Ambulatory Visit: Payer: Self-pay

## 2017-08-16 ENCOUNTER — Emergency Department (HOSPITAL_COMMUNITY): Payer: Medicaid Other

## 2017-08-16 ENCOUNTER — Emergency Department (HOSPITAL_COMMUNITY)
Admission: EM | Admit: 2017-08-16 | Discharge: 2017-08-17 | Disposition: A | Discharge status: Home / Self Care | Payer: Medicaid Other | Attending: Emergency Medicine | Admitting: Emergency Medicine

## 2017-08-16 ENCOUNTER — Encounter (HOSPITAL_COMMUNITY): Payer: Self-pay

## 2017-08-16 DIAGNOSIS — Z79899 Other long term (current) drug therapy: Secondary | ICD-10-CM | POA: Insufficient documentation

## 2017-08-16 DIAGNOSIS — I5040 Unspecified combined systolic (congestive) and diastolic (congestive) heart failure: Secondary | ICD-10-CM | POA: Insufficient documentation

## 2017-08-16 DIAGNOSIS — Z87891 Personal history of nicotine dependence: Secondary | ICD-10-CM | POA: Insufficient documentation

## 2017-08-16 DIAGNOSIS — J441 Chronic obstructive pulmonary disease with (acute) exacerbation: Secondary | ICD-10-CM | POA: Insufficient documentation

## 2017-08-16 DIAGNOSIS — R0602 Shortness of breath: Secondary | ICD-10-CM

## 2017-08-16 LAB — I-STAT TROPONIN, ED: Troponin i, poc: 0.01 ng/mL (ref 0.00–0.08)

## 2017-08-16 MED ORDER — ALBUTEROL SULFATE (2.5 MG/3ML) 0.083% IN NEBU
5.0000 mg | INHALATION_SOLUTION | Freq: Once | RESPIRATORY_TRACT | Status: AC
  Administered 2017-08-16: 5 mg via RESPIRATORY_TRACT
  Filled 2017-08-16: qty 6

## 2017-08-16 NOTE — ED Triage Notes (Signed)
Pt from home BIB [facility] pt friends call out for pt because of SOB. Pt was tachypnea  when ems arrived. Pt had left upper lung wheezing with bilateral lower lung fluids. Pt is a newly diagnosis CHF with noncompliant diet. Pt does have some chest tightness.

## 2017-08-17 LAB — CBC WITH DIFFERENTIAL/PLATELET
BASOS PCT: 0 %
Basophils Absolute: 0 10*3/uL (ref 0.0–0.1)
Eosinophils Absolute: 0.4 10*3/uL (ref 0.0–0.7)
Eosinophils Relative: 5 %
HEMATOCRIT: 38.9 % (ref 36.0–46.0)
HEMOGLOBIN: 13 g/dL (ref 12.0–15.0)
LYMPHS PCT: 65 %
Lymphs Abs: 5.5 10*3/uL — ABNORMAL HIGH (ref 0.7–4.0)
MCH: 28.8 pg (ref 26.0–34.0)
MCHC: 33.4 g/dL (ref 30.0–36.0)
MCV: 86.1 fL (ref 78.0–100.0)
MONOS PCT: 7 %
Monocytes Absolute: 0.6 10*3/uL (ref 0.1–1.0)
NEUTROS ABS: 2 10*3/uL (ref 1.7–7.7)
Neutrophils Relative %: 23 %
PLATELETS: ADEQUATE 10*3/uL (ref 150–400)
RBC: 4.52 MIL/uL (ref 3.87–5.11)
RDW: 13.8 % (ref 11.5–15.5)
WBC: 8.5 10*3/uL (ref 4.0–10.5)

## 2017-08-17 LAB — BASIC METABOLIC PANEL
Anion gap: 6 (ref 5–15)
BUN: 9 mg/dL (ref 6–20)
CO2: 24 mmol/L (ref 22–32)
Calcium: 9.2 mg/dL (ref 8.9–10.3)
Chloride: 108 mmol/L (ref 101–111)
Creatinine, Ser: 0.75 mg/dL (ref 0.44–1.00)
GFR calc Af Amer: >60 mL/min (ref >60)
GLUCOSE: 99 mg/dL (ref 65–99)
POTASSIUM: 3.5 mmol/L (ref 3.5–5.1)
Sodium: 138 mmol/L (ref 135–145)

## 2017-08-17 LAB — BRAIN NATRIURETIC PEPTIDE: B Natriuretic Peptide: 28.7 pg/mL (ref 0.0–100.0)

## 2017-08-17 MED ORDER — ALBUTEROL SULFATE HFA 108 (90 BASE) MCG/ACT IN AERS: 2.0000 | INHALATION_SPRAY | RESPIRATORY_TRACT | 0 refills | Status: DC | PRN

## 2017-08-17 MED ORDER — DOXYCYCLINE HYCLATE 100 MG PO CAPS: 100.0000 mg | ORAL_CAPSULE | Freq: Two times a day (BID) | ORAL | 0 refills | Status: DC

## 2017-08-17 MED ORDER — PREDNISONE 20 MG PO TABS: 40.0000 mg | ORAL_TABLET | Freq: Every day | ORAL | 0 refills | Status: DC

## 2017-08-17 MED ORDER — PREDNISONE 20 MG PO TABS
60.0000 mg | ORAL_TABLET | Freq: Once | ORAL | Status: AC
  Administered 2017-08-17: 60 mg via ORAL
  Filled 2017-08-17: qty 3

## 2017-08-17 NOTE — ED Notes (Signed)
Pt ambulated without difficulty or SOB, pt oxygen remained at 96%

## 2017-08-17 NOTE — Discharge Instructions (Signed)
You were seen today for shortness of breath.This likely is a COPD exacerbation. Continue inhalers at home and steroids. You will also be placed on doxycycline. Follow-up closely with your primary doctor. If you develop worsening shortness of breath, chest pain, fevers, or any new or worsening symptoms you should be reevaluated.

## 2017-08-17 NOTE — ED Provider Notes (Signed)
[facility] DEPT Provider Note   CSN: [id number] Arrival date & time: 08/16/17  2244     History   Chief Complaint Chief Complaint  Patient presents with  . Shortness of Breath    HPI Caitlin [name] is a 51 y.o. female.  HPI  This is a 51 year old female with a history of COPD, tobacco abuse, heart failure who presents with shortness of breath. Patient reports shortness of breath and chest tightness over the last 2 days. She was recently admitted to hospital for congestive heart failure. She had a cardiac catheterization that was clean. Patient currently does not have any chest discomfort. She's much improved after DuoNeb by EMS. Per EMS she was wheezing. Patient reports nonproductive cough. No fevers. She reports that she has gained 3 pounds since hospitalization but "that is normal for me."  Currently she states that she feels comfortable and is without complaint.  On admission, patient had a cardiac catheterization with normal coronary arteries.  Past Medical History:  Diagnosis Date  . COPD (chronic obstructive pulmonary disease) [facility])     Patient Active Problem List   Diagnosis Date Noted  . COPD with acute exacerbation ([place]) 07/15/2017  . Tobacco use 07/15/2017  . Acute combined systolic and diastolic heart failure [facility])     Past Surgical History:  Procedure Laterality Date  . LEFT HEART CATH AND CORONARY ANGIOGRAPHY N/A 07/15/2017   Procedure: LEFT HEART CATH AND CORONARY ANGIOGRAPHY;  Surgeon: Martinique, Peter M, MD;  Location: [facility] LAB;  Service: Cardiovascular;  Laterality: N/A;    OB History    No data available       Home Medications    Prior to Admission medications   Medication Sig Start Date End Date Taking? Authorizing Provider  furosemide (LASIX) 20 MG tablet Take 1 tablet (20 mg total) by mouth daily. 07/19/17  Yes Kathi Ludwig, MD  lisinopril (PRINIVIL,ZESTRIL) 10 MG tablet Take 1 tablet (10 mg total) by mouth daily. 07/19/17 07/19/18  Yes Kathi Ludwig, MD  metoprolol succinate (TOPROL-XL) 25 MG 24 hr tablet Take 1 tablet (25 mg total) by mouth daily. 07/19/17  Yes Kathi Ludwig, MD  vitamin B-12 (CYANOCOBALAMIN) 100 MCG tablet Take 100 mcg by mouth daily.   Yes [provider]  albuterol (PROVENTIL HFA;VENTOLIN HFA) 108 (90 Base) MCG/ACT inhaler Inhale 2 puffs into the lungs every 4 (four) hours as needed for wheezing or shortness of breath. 08/17/17   Sovereign Ramiro, Barbette Hair, MD  doxycycline (VIBRAMYCIN) 100 MG capsule Take 1 capsule (100 mg total) by mouth 2 (two) times daily. 08/17/17   Kristof Nadeem, Barbette Hair, MD  predniSONE (DELTASONE) 20 MG tablet Take 2 tablets (40 mg total) by mouth daily. 08/17/17   Frankye Schwegel, Barbette Hair, MD    Family History Family History  Problem Relation Age of Onset  . Heart disease Mother        age 31's  . Hypertension Mother   . Aneurysm Father        brain    Social History Social History  Substance Use Topics  . Smoking status: Former Smoker    Packs/day: 1.00    Types: Cigarettes  . Smokeless tobacco: Never Used     Comment: quit 07/12/17  . Alcohol use Yes     Comment: occasional alcohol     Allergies   Patient has no known allergies.   Review of Systems Review of Systems  Constitutional: Negative for fever.  Respiratory: Positive for cough, chest tightness and  shortness of breath.   Cardiovascular: Negative for chest pain.  Gastrointestinal: Negative for abdominal pain, nausea and vomiting.  Neurological: Negative for headaches.  All other systems reviewed and are negative.    Physical Exam Updated Vital Signs BP 105/77   Pulse 98   Temp 97.7 F (36.5 C) (Oral)   Resp 17   Ht 5\' 9"  (1.753 m)   Wt 59 kg (130 lb)   LMP 09/09/2014 (Approximate)   SpO2 97%   BMI 19.20 kg/m   Physical Exam  Constitutional: She is oriented to person, place, and time. No distress.  Chronically ill-appearing, no acute distress  HENT:  Head: Normocephalic and  atraumatic.  Cardiovascular: Normal rate, regular rhythm and normal heart sounds.   No murmur heard. Pulmonary/Chest: Effort normal. No respiratory distress. She has wheezes.  Fair air movement, scant expiratory wheeze in all lung fields  Abdominal: Soft. Bowel sounds are normal. There is no tenderness.  Musculoskeletal:  Trace bilateral lower extremity edema  Neurological: She is alert and oriented to person, place, and time.  Skin: Skin is warm and dry.  Psychiatric: She has a normal mood and affect.  Nursing note and vitals reviewed.    ED Treatments / Results  Labs (all labs ordered are listed, but only abnormal results are displayed) Labs Reviewed  CBC WITH DIFFERENTIAL/PLATELET - Abnormal; Notable for the following:       Result Value   Lymphs Abs 5.5 (*)    All other components within normal limits  BASIC METABOLIC PANEL  BRAIN NATRIURETIC PEPTIDE  I-STAT TROPONIN, ED    EKG  EKG Interpretation  Date/Time:  Monday August 16 2017 22:53:50 EDT Ventricular Rate:  74 PR Interval:  122 QRS Duration: 82 QT Interval:  410 QTC Calculation: 455 R Axis:   88 Text Interpretation:  Normal sinus rhythm Septal infarct , age undetermined Marked T-wave abnormality, consider inferolateral ischemia Abnormal ECG Deep T-waves inferior and laterally more prominent Confirmed by Thayer Jew [phone]) on 08/16/2017 11:10:00 PM       Radiology Dg Chest Portable 1 View  Result Date: 08/16/2017 CLINICAL DATA:  Shortness of breath beginning tonight. Chest tightness. Recent diagnosis of CHF and hypertension. EXAM: PORTABLE CHEST 1 VIEW COMPARISON:  07/16/2017 FINDINGS: Emphysematous changes in the lungs. Normal heart size and pulmonary vascularity. No focal airspace disease or consolidation in the lungs. No blunting of costophrenic angles. No pneumothorax. Mediastinal contours appear intact. Calcification of the aorta. IMPRESSION: No active disease. Electronically Signed   By: Lucienne Capers M.D.   On: 08/16/2017 23:35    Procedures Procedures (including critical care time)  Medications Ordered in ED Medications  albuterol (PROVENTIL) (2.5 MG/3ML) 0.083% nebulizer solution 5 mg (5 mg Nebulization Given 08/16/17 2358)  predniSONE (DELTASONE) tablet 60 mg (60 mg Oral Given 08/17/17 0123)     Initial Impression / Assessment and Plan / ED Course  I have reviewed the triage vital signs and the nursing notes.  Pertinent labs & imaging results that were available during my care of the patient were reviewed by me and considered in my medical decision making (see chart for details).     Patient presents with shortness of breath. Ongoing for the last day. She is nontoxic-appearing on exam. Right now she appears comfortable and in no respiratory distress. O2 sats on room air 97%. She has scant wheezing but good air movement. She reports significant improvement with DuoNeb by EMS.She does not appear overtly volume overloaded. EKG and troponin are  reassuring. Chest x-ray shows no evidence of volume overload. Lab work is fairly benign. On multiple rechecks, patient states that she feels fine. She has not required much additional intervention. Suspect she may have a mild COPD exacerbation. She was given steroids. Will discharge with prednisone, HFA, and doxycycline.  After history, exam, and medical workup I feel the patient has been appropriately medically screened and is safe for discharge home. Pertinent diagnoses were discussed with the patient. Patient was given return precautions.   Final Clinical Impressions(s) / ED Diagnoses   Final diagnoses:  SOB (shortness of breath)  COPD exacerbation (HCC)    New Prescriptions New Prescriptions   ALBUTEROL (PROVENTIL HFA;VENTOLIN HFA) 108 (90 BASE) MCG/ACT INHALER    Inhale 2 puffs into the lungs every 4 (four) hours as needed for wheezing or shortness of breath.   DOXYCYCLINE (VIBRAMYCIN) 100 MG CAPSULE    Take 1 capsule (100 mg  total) by mouth 2 (two) times daily.   PREDNISONE (DELTASONE) 20 MG TABLET    Take 2 tablets (40 mg total) by mouth daily.     Merryl Hacker, MD 08/17/17 [phone]

## 2017-09-07 ENCOUNTER — Encounter (HOSPITAL_COMMUNITY): Payer: Self-pay | Admitting: Emergency Medicine

## 2017-09-07 ENCOUNTER — Emergency Department (HOSPITAL_COMMUNITY)
Admission: EM | Admit: 2017-09-07 | Discharge: 2017-09-07 | Disposition: A | Discharge status: Home / Self Care | Payer: Self-pay | Attending: Emergency Medicine | Admitting: Emergency Medicine

## 2017-09-07 ENCOUNTER — Emergency Department (HOSPITAL_COMMUNITY): Payer: Self-pay

## 2017-09-07 DIAGNOSIS — F17210 Nicotine dependence, cigarettes, uncomplicated: Secondary | ICD-10-CM | POA: Insufficient documentation

## 2017-09-07 DIAGNOSIS — J441 Chronic obstructive pulmonary disease with (acute) exacerbation: Secondary | ICD-10-CM | POA: Insufficient documentation

## 2017-09-07 DIAGNOSIS — I252 Old myocardial infarction: Secondary | ICD-10-CM | POA: Insufficient documentation

## 2017-09-07 DIAGNOSIS — I5040 Unspecified combined systolic (congestive) and diastolic (congestive) heart failure: Secondary | ICD-10-CM | POA: Insufficient documentation

## 2017-09-07 DIAGNOSIS — Z79899 Other long term (current) drug therapy: Secondary | ICD-10-CM | POA: Insufficient documentation

## 2017-09-07 HISTORY — DX: Heart failure, unspecified: I50.9

## 2017-09-07 HISTORY — DX: Non-ST elevation (NSTEMI) myocardial infarction: I21.4

## 2017-09-07 LAB — CBC
HEMATOCRIT: 37.5 % (ref 36.0–46.0)
Hemoglobin: 12.5 g/dL (ref 12.0–15.0)
MCH: 28.4 pg (ref 26.0–34.0)
MCHC: 33.3 g/dL (ref 30.0–36.0)
MCV: 85.2 fL (ref 78.0–100.0)
Platelets: 295 10*3/uL (ref 150–400)
RBC: 4.4 MIL/uL (ref 3.87–5.11)
RDW: 14.2 % (ref 11.5–15.5)
WBC: 7.9 10*3/uL (ref 4.0–10.5)

## 2017-09-07 LAB — BASIC METABOLIC PANEL
Anion gap: 9 (ref 5–15)
BUN: 8 mg/dL (ref 6–20)
CALCIUM: 9.3 mg/dL (ref 8.9–10.3)
CO2: 22 mmol/L (ref 22–32)
Chloride: 109 mmol/L (ref 101–111)
Creatinine, Ser: 0.54 mg/dL (ref 0.44–1.00)
GFR calc Af Amer: >60 mL/min (ref >60)
GFR calc non Af Amer: >60 mL/min (ref >60)
GLUCOSE: 96 mg/dL (ref 65–99)
Potassium: 3.8 mmol/L (ref 3.5–5.1)
Sodium: 140 mmol/L (ref 135–145)

## 2017-09-07 LAB — TROPONIN I: Troponin I: <0.03 ng/mL (ref <0.03)

## 2017-09-07 LAB — BRAIN NATRIURETIC PEPTIDE: B NATRIURETIC PEPTIDE 5: 29.2 pg/mL (ref 0.0–100.0)

## 2017-09-07 MED ORDER — PREDNISONE 20 MG PO TABS: ORAL_TABLET | ORAL | 0 refills | Status: DC

## 2017-09-07 MED ORDER — ALBUTEROL SULFATE (2.5 MG/3ML) 0.083% IN NEBU
5.0000 mg | INHALATION_SOLUTION | Freq: Once | RESPIRATORY_TRACT | Status: AC
  Administered 2017-09-07: 5 mg via RESPIRATORY_TRACT

## 2017-09-07 MED ORDER — ALBUTEROL SULFATE (2.5 MG/3ML) 0.083% IN NEBU: 2.5000 mg | INHALATION_SOLUTION | Freq: Four times a day (QID) | RESPIRATORY_TRACT | 12 refills | Status: DC | PRN

## 2017-09-07 MED ORDER — ALBUTEROL (5 MG/ML) CONTINUOUS INHALATION SOLN
15.0000 mg/h | INHALATION_SOLUTION | Freq: Once | RESPIRATORY_TRACT | Status: AC
  Administered 2017-09-07: 15 mg/h via RESPIRATORY_TRACT
  Filled 2017-09-07: qty 20

## 2017-09-07 MED ORDER — ALBUTEROL SULFATE (2.5 MG/3ML) 0.083% IN NEBU
INHALATION_SOLUTION | RESPIRATORY_TRACT | Status: AC
  Filled 2017-09-07: qty 6

## 2017-09-07 MED ORDER — IPRATROPIUM BROMIDE 0.02 % IN SOLN: 0.5000 mg | Freq: Four times a day (QID) | RESPIRATORY_TRACT | 12 refills | Status: DC

## 2017-09-07 MED ORDER — IPRATROPIUM BROMIDE 0.02 % IN SOLN
1.5000 mg | Freq: Once | RESPIRATORY_TRACT | Status: AC
  Administered 2017-09-07: 1.5 mg via RESPIRATORY_TRACT
  Filled 2017-09-07: qty 7.5

## 2017-09-07 MED ORDER — PREDNISONE 20 MG PO TABS
60.0000 mg | ORAL_TABLET | Freq: Once | ORAL | Status: AC
  Administered 2017-09-07: 60 mg via ORAL
  Filled 2017-09-07: qty 3

## 2017-09-07 NOTE — ED Provider Notes (Signed)
[facility] EMERGENCY DEPARTMENT Provider Note   CSN: [id number] Arrival date & time: 09/07/17  0301     History   Chief Complaint Chief Complaint  Patient presents with  . COPD    HPI Caitlin [name] is a 51 y.o. female.  HPI Patient has history of COPD.  She reports for about a day now she is getting increasingly short of breath.  Some cough.  No fever.  Chest pain with harsh coughing.  Patient does continue to smoke an occasional cigarette when possible.  He reports he has cut back from a pack per day.  Patient was treated about 2 weeks ago with steroids and doxycycline.  She reports that she got better at that time but now has worsened again.  She reports her inhalers and nebulizers are not helping much at home. Past Medical History:  Diagnosis Date  . CHF (congestive heart failure) ([place])   . COPD (chronic obstructive pulmonary disease) ([place])   . NSTEMI (non-ST elevated myocardial infarction) [facility])     Patient Active Problem List   Diagnosis Date Noted  . COPD with acute exacerbation ([place]) 07/15/2017  . Tobacco use 07/15/2017  . Acute combined systolic and diastolic heart failure [facility])     Past Surgical History:  Procedure Laterality Date  . LEFT HEART CATH AND CORONARY ANGIOGRAPHY N/A 07/15/2017   Procedure: LEFT HEART CATH AND CORONARY ANGIOGRAPHY;  Surgeon: Martinique, Peter M, MD;  Location: [facility] LAB;  Service: Cardiovascular;  Laterality: N/A;    OB History    No data available       Home Medications    Prior to Admission medications   Medication Sig Start Date End Date Taking? Authorizing Provider  albuterol (PROVENTIL HFA;VENTOLIN HFA) 108 (90 Base) MCG/ACT inhaler Inhale 2 puffs into the lungs every 4 (four) hours as needed for wheezing or shortness of breath. 08/17/17   Horton, Barbette Hair, MD  doxycycline (VIBRAMYCIN) 100 MG capsule Take 1 capsule (100 mg total) by mouth 2 (two) times daily. 08/17/17   Horton, Barbette Hair, MD    furosemide (LASIX) 20 MG tablet Take 1 tablet (20 mg total) by mouth daily. 07/19/17   Kathi Ludwig, MD  lisinopril (PRINIVIL,ZESTRIL) 10 MG tablet Take 1 tablet (10 mg total) by mouth daily. 07/19/17 07/19/18  Kathi Ludwig, MD  metoprolol succinate (TOPROL-XL) 25 MG 24 hr tablet Take 1 tablet (25 mg total) by mouth daily. 07/19/17   Kathi Ludwig, MD  predniSONE (DELTASONE) 20 MG tablet Take 2 tablets (40 mg total) by mouth daily. 08/17/17   Horton, Barbette Hair, MD  vitamin B-12 (CYANOCOBALAMIN) 100 MCG tablet Take 100 mcg by mouth daily.    [provider]    Family History Family History  Problem Relation Age of Onset  . Heart disease Mother        age 33's  . Hypertension Mother   . Aneurysm Father        brain    Social History Social History  Substance Use Topics  . Smoking status: Former Smoker    Packs/day: 1.00    Types: Cigarettes  . Smokeless tobacco: Never Used     Comment: quit 07/12/17  . Alcohol use Yes     Comment: occasional alcohol     Allergies   Patient has no known allergies.   Review of Systems Review of Systems  10 Systems reviewed and are negative for acute change except as noted in the HPI.  Physical Exam Updated Vital Signs BP (!) 155/140 (BP Location: Right Arm)   Pulse 94   Temp 97.6 F (36.4 C) (Oral)   Resp (!) 24   Ht 5\' 9"  (1.753 m)   Wt 59 kg (130 lb)   LMP 09/09/2014 (Approximate)   SpO2 97%   BMI 19.20 kg/m   Physical Exam  Constitutional: She is oriented to person, place, and time. She appears well-developed and well-nourished. No distress.  HENT:  Head: Normocephalic and atraumatic.  Nose: Nose normal.  Mouth/Throat: Oropharynx is clear and moist.  Eyes: Conjunctivae and EOM are normal.  Neck: Neck supple.  Cardiovascular: Normal rate and regular rhythm.   No murmur heard. Pulmonary/Chest:  Mild to moderate increased work of breathing at rest.  Patient is alert.  Speaking in full sentences.   Diffuse expiratory wheeze bilateral with adequate airflow at the bases.  Abdominal: Soft. There is no tenderness.  Musculoskeletal: Normal range of motion. She exhibits no edema or tenderness.  Neurological: She is alert and oriented to person, place, and time. No cranial nerve deficit. She exhibits normal muscle tone. Coordination normal.  Skin: Skin is warm and dry.  Psychiatric: She has a normal mood and affect.  Nursing note and vitals reviewed.    ED Treatments / Results  Labs (all labs ordered are listed, but only abnormal results are displayed) Labs Reviewed  BASIC METABOLIC PANEL  CBC  TROPONIN I  BRAIN NATRIURETIC PEPTIDE    EKG  EKG Interpretation  Date/Time:  Tuesday September 07 2017 03:11:03 EDT Ventricular Rate:  81 PR Interval:  138 QRS Duration: 74 QT Interval:  336 QTC Calculation: 390 R Axis:   82 Text Interpretation:  Normal sinus rhythm with sinus arrhythmia Septal infarct , age undetermined Abnormal ECG no acute ischemic appearance. no change from normal baseline. T wave correction c/w most recent previous Confirmed by Charlesetta Shanks [phone]) on 09/07/2017 7:58:31 AM       Radiology Dg Chest 2 View  Result Date: 09/07/2017 CLINICAL DATA:  Acute onset of shortness of breath. Initial encounter. EXAM: CHEST  2 VIEW COMPARISON:  Chest radiograph performed 08/16/2017 FINDINGS: The lungs are well-aerated and clear. There is no evidence of focal opacification, pleural effusion or pneumothorax. The heart is normal in size; the mediastinal contour is within normal limits. No acute osseous abnormalities are seen. IMPRESSION: No acute cardiopulmonary process seen. Electronically Signed   By: Garald Balding M.D.   On: 09/07/2017 03:57    Procedures Procedures (including critical care time)  Medications Ordered in ED Medications  albuterol (PROVENTIL,VENTOLIN) solution continuous neb (not administered)  ipratropium (ATROVENT) nebulizer solution 1.5 mg (not  administered)  albuterol (PROVENTIL) (2.5 MG/3ML) 0.083% nebulizer solution 5 mg (5 mg Nebulization Given 09/07/17 0324)  predniSONE (DELTASONE) tablet 60 mg (60 mg Oral Given 09/07/17 0809)     Initial Impression / Assessment and Plan / ED Course  I have reviewed the triage vital signs and the nursing notes.  Pertinent labs & imaging results that were available during my care of the patient were reviewed by me and considered in my medical decision making (see chart for details).    Recheck: Patient much improved after continuous neb.  Improved aeration, decreased wheeze.  Patient feels back to baseline.  Final Clinical Impressions(s) / ED Diagnoses   Final diagnoses:  COPD exacerbation ([place])   Patient does continue to periodically smoke.  She is counseled on necessity to quit smoking.  She is nontoxic and alert.  No signs of pneumonia superimposed on COPD.  Patient discharged in good condition. New Prescriptions New Prescriptions   No medications on file     Charlesetta Shanks, MD 09/07/17 1118

## 2017-09-07 NOTE — ED Triage Notes (Signed)
Patient reports COPD flare up with SOB , wheezing and productive cough and chest tightness onset yesterday , denies fever or chills .

## 2017-09-08 ENCOUNTER — Inpatient Hospital Stay (HOSPITAL_COMMUNITY)
Admission: EM | Admit: 2017-09-08 | Discharge: 2017-09-28 | DRG: 004 | Disposition: A | Discharge status: Rehabilitation Facility | Payer: Self-pay | Attending: Internal Medicine | Admitting: Internal Medicine

## 2017-09-08 ENCOUNTER — Encounter (HOSPITAL_COMMUNITY): Payer: Self-pay | Admitting: Emergency Medicine

## 2017-09-08 ENCOUNTER — Other Ambulatory Visit: Payer: Self-pay

## 2017-09-08 ENCOUNTER — Emergency Department (HOSPITAL_COMMUNITY): Payer: Self-pay

## 2017-09-08 DIAGNOSIS — K5900 Constipation, unspecified: Secondary | ICD-10-CM

## 2017-09-08 DIAGNOSIS — Z978 Presence of other specified devices: Secondary | ICD-10-CM

## 2017-09-08 DIAGNOSIS — R410 Disorientation, unspecified: Secondary | ICD-10-CM

## 2017-09-08 DIAGNOSIS — I471 Supraventricular tachycardia, unspecified: Secondary | ICD-10-CM

## 2017-09-08 DIAGNOSIS — I959 Hypotension, unspecified: Secondary | ICD-10-CM

## 2017-09-08 DIAGNOSIS — I5022 Chronic systolic (congestive) heart failure: Secondary | ICD-10-CM

## 2017-09-08 DIAGNOSIS — R0902 Hypoxemia: Secondary | ICD-10-CM

## 2017-09-08 DIAGNOSIS — R5381 Other malaise: Secondary | ICD-10-CM | POA: Diagnosis not present

## 2017-09-08 DIAGNOSIS — F05 Delirium due to known physiological condition: Secondary | ICD-10-CM | POA: Diagnosis not present

## 2017-09-08 DIAGNOSIS — Z430 Encounter for attention to tracheostomy: Secondary | ICD-10-CM

## 2017-09-08 DIAGNOSIS — Z789 Other specified health status: Secondary | ICD-10-CM

## 2017-09-08 DIAGNOSIS — R0602 Shortness of breath: Secondary | ICD-10-CM | POA: Diagnosis present

## 2017-09-08 DIAGNOSIS — E876 Hypokalemia: Secondary | ICD-10-CM

## 2017-09-08 DIAGNOSIS — Z9189 Other specified personal risk factors, not elsewhere classified: Secondary | ICD-10-CM

## 2017-09-08 DIAGNOSIS — Z8249 Family history of ischemic heart disease and other diseases of the circulatory system: Secondary | ICD-10-CM

## 2017-09-08 DIAGNOSIS — T380X5A Adverse effect of glucocorticoids and synthetic analogues, initial encounter: Secondary | ICD-10-CM

## 2017-09-08 DIAGNOSIS — G9340 Encephalopathy, unspecified: Secondary | ICD-10-CM | POA: Diagnosis present

## 2017-09-08 DIAGNOSIS — Z7952 Long term (current) use of systemic steroids: Secondary | ICD-10-CM

## 2017-09-08 DIAGNOSIS — Z781 Physical restraint status: Secondary | ICD-10-CM

## 2017-09-08 DIAGNOSIS — J9601 Acute respiratory failure with hypoxia: Secondary | ICD-10-CM

## 2017-09-08 DIAGNOSIS — Z930 Tracheostomy status: Secondary | ICD-10-CM

## 2017-09-08 DIAGNOSIS — R001 Bradycardia, unspecified: Secondary | ICD-10-CM | POA: Diagnosis present

## 2017-09-08 DIAGNOSIS — Z79899 Other long term (current) drug therapy: Secondary | ICD-10-CM

## 2017-09-08 DIAGNOSIS — I5043 Acute on chronic combined systolic (congestive) and diastolic (congestive) heart failure: Secondary | ICD-10-CM | POA: Diagnosis present

## 2017-09-08 DIAGNOSIS — I10 Essential (primary) hypertension: Secondary | ICD-10-CM

## 2017-09-08 DIAGNOSIS — R061 Stridor: Secondary | ICD-10-CM | POA: Diagnosis not present

## 2017-09-08 DIAGNOSIS — J9622 Acute and chronic respiratory failure with hypercapnia: Secondary | ICD-10-CM | POA: Diagnosis present

## 2017-09-08 DIAGNOSIS — J14 Pneumonia due to Hemophilus influenzae: Secondary | ICD-10-CM

## 2017-09-08 DIAGNOSIS — I5041 Acute combined systolic (congestive) and diastolic (congestive) heart failure: Secondary | ICD-10-CM | POA: Diagnosis present

## 2017-09-08 DIAGNOSIS — J440 Chronic obstructive pulmonary disease with acute lower respiratory infection: Secondary | ICD-10-CM | POA: Diagnosis not present

## 2017-09-08 DIAGNOSIS — E0590 Thyrotoxicosis, unspecified without thyrotoxic crisis or storm: Secondary | ICD-10-CM | POA: Diagnosis not present

## 2017-09-08 DIAGNOSIS — I5181 Takotsubo syndrome: Secondary | ICD-10-CM

## 2017-09-08 DIAGNOSIS — F419 Anxiety disorder, unspecified: Secondary | ICD-10-CM | POA: Diagnosis present

## 2017-09-08 DIAGNOSIS — Z452 Encounter for adjustment and management of vascular access device: Secondary | ICD-10-CM

## 2017-09-08 DIAGNOSIS — R739 Hyperglycemia, unspecified: Secondary | ICD-10-CM | POA: Diagnosis not present

## 2017-09-08 DIAGNOSIS — J441 Chronic obstructive pulmonary disease with (acute) exacerbation: Secondary | ICD-10-CM

## 2017-09-08 DIAGNOSIS — B963 Hemophilus influenzae [H. influenzae] as the cause of diseases classified elsewhere: Secondary | ICD-10-CM | POA: Diagnosis not present

## 2017-09-08 DIAGNOSIS — F17210 Nicotine dependence, cigarettes, uncomplicated: Secondary | ICD-10-CM | POA: Diagnosis present

## 2017-09-08 DIAGNOSIS — Z01818 Encounter for other preprocedural examination: Secondary | ICD-10-CM

## 2017-09-08 DIAGNOSIS — I110 Hypertensive heart disease with heart failure: Secondary | ICD-10-CM | POA: Diagnosis present

## 2017-09-08 DIAGNOSIS — I252 Old myocardial infarction: Secondary | ICD-10-CM

## 2017-09-08 DIAGNOSIS — J9621 Acute and chronic respiratory failure with hypoxia: Principal | ICD-10-CM | POA: Diagnosis present

## 2017-09-08 DIAGNOSIS — K5649 Other impaction of intestine: Secondary | ICD-10-CM | POA: Diagnosis not present

## 2017-09-08 DIAGNOSIS — N179 Acute kidney failure, unspecified: Secondary | ICD-10-CM

## 2017-09-08 DIAGNOSIS — J9801 Acute bronchospasm: Secondary | ICD-10-CM | POA: Diagnosis not present

## 2017-09-08 DIAGNOSIS — I5042 Chronic combined systolic (congestive) and diastolic (congestive) heart failure: Secondary | ICD-10-CM

## 2017-09-08 DIAGNOSIS — J9620 Acute and chronic respiratory failure, unspecified whether with hypoxia or hypercapnia: Secondary | ICD-10-CM | POA: Diagnosis present

## 2017-09-08 DIAGNOSIS — D72829 Elevated white blood cell count, unspecified: Secondary | ICD-10-CM

## 2017-09-08 DIAGNOSIS — Z9289 Personal history of other medical treatment: Secondary | ICD-10-CM

## 2017-09-08 DIAGNOSIS — Z720 Tobacco use: Secondary | ICD-10-CM

## 2017-09-08 DIAGNOSIS — Z4659 Encounter for fitting and adjustment of other gastrointestinal appliance and device: Secondary | ICD-10-CM

## 2017-09-08 LAB — CBC WITH DIFFERENTIAL/PLATELET
BASOS ABS: 0 10*3/uL (ref 0.0–0.1)
BASOS PCT: 0 %
Eosinophils Absolute: 0.3 10*3/uL (ref 0.0–0.7)
Eosinophils Relative: 3 %
HCT: 39.3 % (ref 36.0–46.0)
Hemoglobin: 13.1 g/dL (ref 12.0–15.0)
Lymphocytes Relative: 17 %
Lymphs Abs: 1.6 10*3/uL (ref 0.7–4.0)
MCH: 28.7 pg (ref 26.0–34.0)
MCHC: 33.3 g/dL (ref 30.0–36.0)
MCV: 86 fL (ref 78.0–100.0)
MONO ABS: 0.3 10*3/uL (ref 0.1–1.0)
MONOS PCT: 3 %
Neutro Abs: 7.5 10*3/uL (ref 1.7–7.7)
Neutrophils Relative %: 77 %
PLATELETS: 325 10*3/uL (ref 150–400)
RBC: 4.57 MIL/uL (ref 3.87–5.11)
RDW: 14.5 % (ref 11.5–15.5)
WBC: 9.7 10*3/uL (ref 4.0–10.5)

## 2017-09-08 LAB — BASIC METABOLIC PANEL
ANION GAP: 9 (ref 5–15)
BUN: 8 mg/dL (ref 6–20)
CALCIUM: 9.5 mg/dL (ref 8.9–10.3)
CO2: 24 mmol/L (ref 22–32)
CREATININE: 0.57 mg/dL (ref 0.44–1.00)
Chloride: 108 mmol/L (ref 101–111)
GLUCOSE: 109 mg/dL — AB (ref 65–99)
Potassium: 3.2 mmol/L — ABNORMAL LOW (ref 3.5–5.1)
Sodium: 141 mmol/L (ref 135–145)

## 2017-09-08 LAB — BRAIN NATRIURETIC PEPTIDE: B NATRIURETIC PEPTIDE 5: 99.2 pg/mL (ref 0.0–100.0)

## 2017-09-08 LAB — D-DIMER, QUANTITATIVE: D-Dimer, Quant: 0.29 ug/mL-FEU (ref 0.00–0.50)

## 2017-09-08 LAB — I-STAT TROPONIN, ED: Troponin i, poc: 0.02 ng/mL (ref 0.00–0.08)

## 2017-09-08 MED ORDER — IPRATROPIUM-ALBUTEROL 0.5-2.5 (3) MG/3ML IN SOLN: 3.0000 mL | RESPIRATORY_TRACT | Status: DC | PRN

## 2017-09-08 MED ORDER — FUROSEMIDE 10 MG/ML IJ SOLN
40.0000 mg | Freq: Once | INTRAMUSCULAR | Status: AC
Start: 2017-09-08 — End: 2017-09-08
  Administered 2017-09-08: 40 mg via INTRAVENOUS
  Filled 2017-09-08: qty 4

## 2017-09-08 MED ORDER — LISINOPRIL 10 MG PO TABS
10.0000 mg | ORAL_TABLET | Freq: Every day | ORAL | Status: DC
  Administered 2017-09-09: 10 mg via ORAL
  Filled 2017-09-08: qty 1

## 2017-09-08 MED ORDER — SODIUM CHLORIDE 0.9 % IV BOLUS (SEPSIS)
1000.0000 mL | Freq: Once | INTRAVENOUS | Status: AC
  Administered 2017-09-08: 1000 mL via INTRAVENOUS

## 2017-09-08 MED ORDER — ENOXAPARIN SODIUM 40 MG/0.4ML ~~LOC~~ SOLN
40.0000 mg | SUBCUTANEOUS | Status: DC
  Administered 2017-09-09 – 2017-09-27 (×19): 40 mg via SUBCUTANEOUS
  Filled 2017-09-08 (×20): qty 0.4

## 2017-09-08 MED ORDER — ALBUTEROL (5 MG/ML) CONTINUOUS INHALATION SOLN
15.0000 mg/h | INHALATION_SOLUTION | RESPIRATORY_TRACT | Status: DC
  Administered 2017-09-08: 15 mg/h via RESPIRATORY_TRACT
  Filled 2017-09-08: qty 20

## 2017-09-08 MED ORDER — GUAIFENESIN-DM 100-10 MG/5ML PO SYRP
5.0000 mL | ORAL_SOLUTION | ORAL | Status: DC | PRN
  Administered 2017-09-08: 5 mL via ORAL
  Filled 2017-09-08 (×2): qty 5

## 2017-09-08 MED ORDER — POTASSIUM CHLORIDE CRYS ER 20 MEQ PO TBCR
40.0000 meq | EXTENDED_RELEASE_TABLET | Freq: Once | ORAL | Status: AC
  Administered 2017-09-08: 40 meq via ORAL
  Filled 2017-09-08: qty 2

## 2017-09-08 MED ORDER — IPRATROPIUM BROMIDE 0.02 % IN SOLN
0.5000 mg | Freq: Once | RESPIRATORY_TRACT | Status: AC
  Administered 2017-09-08: 0.5 mg via RESPIRATORY_TRACT
  Filled 2017-09-08: qty 2.5

## 2017-09-08 MED ORDER — METOPROLOL SUCCINATE ER 25 MG PO TB24
25.0000 mg | ORAL_TABLET | Freq: Every day | ORAL | Status: DC
  Administered 2017-09-09: 25 mg via ORAL
  Filled 2017-09-08 (×2): qty 1

## 2017-09-08 MED ORDER — IPRATROPIUM-ALBUTEROL 0.5-2.5 (3) MG/3ML IN SOLN
3.0000 mL | RESPIRATORY_TRACT | Status: DC | PRN
  Administered 2017-09-08 – 2017-09-09 (×2): 3 mL via RESPIRATORY_TRACT
  Filled 2017-09-08 (×2): qty 3

## 2017-09-08 NOTE — ED Triage Notes (Addendum)
To ED via [facility] for eval of continued sob. Pt was in ED for same yesterday. 5 breathing treatments last pm and into this am but not feeling better. Pt alert on arrival - receiving breathing tx. Skin w/d, resp doesn't appear labored  Given PTA-   10mg  Albuterol 0.5mg  Atrovent 125mg  Solumedrol 2GM Magnesium

## 2017-09-08 NOTE — ED Notes (Signed)
Pt taken to bathroom. Tolerated well.

## 2017-09-08 NOTE — ED Notes (Signed)
Report called to [facility] RN on 3E.

## 2017-09-08 NOTE — ED Notes (Signed)
Family at bedside. 

## 2017-09-08 NOTE — H&P (Signed)
Date: 09/08/2017               Patient Name:  Caitlin [name] MRN: [id number]  DOB: 1966-02-22 Age / Sex: 51 y.o., female   PCP: [name], MD         Medical Service: Internal Medicine Teaching Service         Attending Physician: Dr. [name], Mallie Mussel, *    First Contact: Dr. Thomasene Ripple Pager: [phone]  Second Contact: Dr. Kalman Shan Pager: [phone]       After Hours (After 5p/  First Contact Pager: [phone]  weekends / holidays): Second Contact Pager: [phone]   Chief Complaint: Shortness of breath  History of Present Illness:  Caitlin [name] is a 51 yo with a PMH of CHF (LVEF 40-45%, grade 2 DD on 06/2017 echo) and COPD who presented with acute onset SOB which has progressively worsened over the past 72 hours. The patient was recently admitted from 8/30 - 9/03 for what was thought to be a COPD exacerbation but was later determined to be acute exacerbation of chronic The patient was cutting her trees over the weekend when she first noticed that it was difficult to breath. She denied chest pain, diaphoresis, and nausea/vomting at that time. Prior to the past few days, the patient had not need to use her albuterol inhaler for SOB on any occasion. Since developing these symptoms, however, she has used her albuterol inhaler and nebulizers almost continuously since her symptoms began. She has not gotten any relief with this medication. Her symptoms progressed to the point where she developed chest tightness with each breath and she could not walk across the room without stopping to catch her breath. She also developed a cough that has become productive with green sputum within the last 24 hours. She endorses subjective fevers but has not taken her own temperature. She denies abdominal pain, lower extremity swelling, change in bowel/bladder habits, significant change in weight overnight, headaches, dizziness, and changes in vision.  24 hours ago she came to the ED for  evaluation. Per chart review the patient was found to be wheezing on exam, troponin levels were negative, and the patient's symptoms of SOB improved with administration of DuoNebs and steroids. She was given treatments, including steroids and doxycycline, for treatment of COPD exacerbation and discharged as she had clinically improved over her ED stay. After the patient left the ED her symptoms returned. Overnight the patient endorses significant worsening of chest tightness, SOB, and discomfort. She states that her symptoms worsened while laying flat and improved after she started sitting up and resting on multiple pillows. She also endorsed PND overnight. Her SOB continued to worsen until the morning and eventually decided to return to ED for evaluation.  In the ED today, the patient was found to be diffusely wheezing even after treatment with solumedrol, magnesium, and DuoNebs with EMS. Her iSTAT troponin was negative, D-Dimer within normal limits, and BNP elevated to 99 from 29 one day ago. She was given continuous albuterol, ipratropium 0.5 mg, and 1L NS.    Meds:  Current Meds  Medication Sig  . albuterol (PROVENTIL HFA;VENTOLIN HFA) 108 (90 Base) MCG/ACT inhaler Inhale 2 puffs into the lungs every 4 (four) hours as needed for wheezing or shortness of breath.  Caitlin [name] albuterol (PROVENTIL) (2.5 MG/3ML) 0.083% nebulizer solution Take 3 mLs (2.5 mg total) by nebulization every 6 (six) hours as needed for wheezing or shortness of breath.  . furosemide (LASIX) 20  MG tablet Take 1 tablet (20 mg total) by mouth daily.  Caitlin [name] ipratropium (ATROVENT) 0.02 % nebulizer solution Take 2.5 mLs (0.5 mg total) by nebulization 4 (four) times daily.  Caitlin [name] lisinopril (PRINIVIL,ZESTRIL) 10 MG tablet Take 1 tablet (10 mg total) by mouth daily.  . metoprolol succinate (TOPROL-XL) 25 MG 24 hr tablet Take 1 tablet (25 mg total) by mouth daily.  . predniSONE (DELTASONE) 20 MG tablet Take 2 tablets (40 mg total) by mouth daily.  .  vitamin B-12 (CYANOCOBALAMIN) 100 MCG tablet Take 100 mcg by mouth daily.   Allergies: Allergies as of 09/08/2017  . (No Known Allergies)   Past Medical History: Past Medical History:  Diagnosis Date  . CHF (congestive heart failure) ([place])   . COPD (chronic obstructive pulmonary disease) ([place])   . NSTEMI (non-ST elevated myocardial infarction) [facility])    Past Surgical History: Past Surgical History:  Procedure Laterality Date  . LEFT HEART CATH AND CORONARY ANGIOGRAPHY N/A 07/15/2017   Procedure: LEFT HEART CATH AND CORONARY ANGIOGRAPHY;  Surgeon: Martinique, Peter M, MD;  Location: [facility];  Service: Cardiovascular;  Laterality: N/A;   Family History:  Family History  Problem Relation Age of Onset  . Heart disease Mother        age 75's  . Hypertension Mother   . Aneurysm Father        brain   Social History:  Patient was living with sister after recent hospitalization and diagnosis with heart failure. She recently moved back home and has been living on her own. The patient endorses a 30-year PPD history of cigarette use. Patient recently started efforts to stop smoking and is now down to about 1 cigarette per day. The patient endorses intermittent marijuana use. She has not smoked anything since her symptoms started 72 hours ago. No IV drug use.   Review of Systems: A complete ROS was negative except as per HPI.  Physical Exam: Blood pressure 130/82, pulse (!) 119, resp. rate 17, height 5\' 9"  (1.753 m), weight 130 lb (59 kg), last menstrual period 09/09/2014, SpO2 97 %.  Physical Exam  Constitutional:  Thin woman, sitting comfortably in bed with Sloan in place in no acute distress.  HENT:  Mouth/Throat: Oropharynx is clear and moist.  Eyes: Pupils are equal, round, and reactive to light. EOM are normal.  Cardiovascular: Regular rhythm and intact distal pulses.  Exam reveals no friction rub.   No murmur heard. Tachycardic  Pulmonary/Chest:  Patient sitting comfortably  in bed with Holmen in place and able to speak in full sentences on exam. No accessory muscle use or nasal flaring. No evidence of cyanosis. Diffuse inspiratory and expiratory wheezing throughout all lung fields. No crackles appreciated.  Abdominal: Soft. Bowel sounds are normal. She exhibits no distension. There is no tenderness. There is no guarding.  Musculoskeletal: She exhibits no edema (of bilateral lower extremities) or tenderness (of bilateral lower extremities).  Neurological:  Face strength and sensation intact bilaterally. Tongue midline. Gross motor and sensation to light touch of upper and lower extremities intact bilaterally.   Skin: Skin is warm and dry. Capillary refill takes less than 2 seconds. No rash noted. No erythema. No pallor.   EKG: personally reviewed my interpretation is sinus tachycardia without ST elevation or TWI.   CXR: personally reviewed my interpretation is bilateral hyperinflation without evidence of pleural effusion, lobar infiltrations, or interstitial edema.   Assessment & Plan by Problem: Active Problems:   Shortness of breath  Deshana Rominger is a 51 yo with a PMH of CHF (LVEF 40-45%, grade 2 DD on 06/2017 echo) and COPD who presented with acute onset SOB that progressively worsened during the 72 hours leading up to presentation. On arrival the patient was given continuous albuterol, IV solumedrol, and magnesium. Patient was admitted to the internal medicine teaching service for management. The specific problems addressed during admission were as follows:  Shortness of breath with hypoxia 2/2 COPD vs CHF exacerbation: The patient's description of progressively worsening SOB, subjective fevers, productive cough with green sputum, hyperinflation on CXR, and diffuse wheezing on exam are consistent with COPD exacerbation. The patient's symptoms of SOB at rest and with exertion, however, have shown minimal improvement with albuterol, DuoNebs, and steroids, suggesting an  alternative etiology for her symptoms. The patient does not appear volume overloaded on exam, as there are no crackles, no evidence of pulmonary congestion/interstitial edema on CXR, or lower extremity swelling. The patient does not endorse a recent history of weight gain. Given her new diagnosis of CHF and atypical clinical presentation on recent hospitalization on 8/30-9/3 it is possible that her symptoms of SOB represent acute CHF exacerbation. Will continue supportive care for COPD exacerbation and add IV lasix to her regimen overnight to see if this improves her symptoms while hospitalized.  -Continuous cardiorespiratory monitoring -DuoNebs q4 hours PRN -IV lasix 40 mg  -Consider echocardiography in AM  -BMP in AM -Continue home metoprolol succinate 25 mg daily for CHF  HTN: Patient's BP elevated in ED to 150s/100s, likely 2/2 continuous albuterol treatments. Will continue to monitor.  -Continue home lisinopril 10 mg daily  Fluids: None VTE prophylaxis: None Diet: Heart Healthy diet  Dispo: Admit patient to Observation with expected length of stay less than 2 midnights.  SignedThomasene Ripple, MD 09/08/2017, 1:50 PM  Pager: [phone]

## 2017-09-08 NOTE — ED Provider Notes (Signed)
[facility] EMERGENCY DEPARTMENT Provider Note   CSN: [id number] Arrival date & time: 09/08/17  4098     History   Chief Complaint Chief Complaint  Patient presents with  . Shortness of Breath    HPI Caitlin [name] is a 51 y.o. female.  HPI  51 year old female with a history of CHF and COPD presents with shortness of breath.  Patient states this started 2 days ago.  First had a nonproductive cough, now is mildly productive since yesterday.  Was seen in the ER yesterday, treated with continuous albuterol, felt better temporarily and discharged home.  However she states she is now short of breath again shortly after getting home.  Significantly affecting daily activities.  She has not noticed a fever.  She has been having some chest tightness this morning.  Overall this feels like recurrent COPD to her.  No leg swelling or leg pain.  She has given herself 5 breathing treatments throughout the night.  She did take her dose of 40 mg prednisone this morning at about 7 AM.  EMS has given her another breathing treatment and she feels mild improvement in symptoms but still short of breath and having chest tightness. EMS also gave her solumedrol and magnesium.  Past Medical History:  Diagnosis Date  . CHF (congestive heart failure) ([place])   . COPD (chronic obstructive pulmonary disease) ([place])   . NSTEMI (non-ST elevated myocardial infarction) [facility])     Patient Active Problem List   Diagnosis Date Noted  . Shortness of breath 09/08/2017  . COPD with acute exacerbation ([place]) 07/15/2017  . Tobacco use 07/15/2017  . Acute combined systolic and diastolic heart failure [facility])     Past Surgical History:  Procedure Laterality Date  . LEFT HEART CATH AND CORONARY ANGIOGRAPHY N/A 07/15/2017   Procedure: LEFT HEART CATH AND CORONARY ANGIOGRAPHY;  Surgeon: Martinique, Peter M, MD;  Location: [facility];  Service: Cardiovascular;  Laterality: N/A;    OB History    No data  available       Home Medications    Prior to Admission medications   Medication Sig Start Date End Date Taking? Authorizing Provider  albuterol (PROVENTIL HFA;VENTOLIN HFA) 108 (90 Base) MCG/ACT inhaler Inhale 2 puffs into the lungs every 4 (four) hours as needed for wheezing or shortness of breath. 08/17/17  Yes Horton, Barbette Hair, MD  albuterol (PROVENTIL) (2.5 MG/3ML) 0.083% nebulizer solution Take 3 mLs (2.5 mg total) by nebulization every 6 (six) hours as needed for wheezing or shortness of breath. 09/07/17  Yes Charlesetta Shanks, MD  furosemide (LASIX) 20 MG tablet Take 1 tablet (20 mg total) by mouth daily. 07/19/17  Yes Kathi Ludwig, MD  ipratropium (ATROVENT) 0.02 % nebulizer solution Take 2.5 mLs (0.5 mg total) by nebulization 4 (four) times daily. 09/07/17  Yes Charlesetta Shanks, MD  lisinopril (PRINIVIL,ZESTRIL) 10 MG tablet Take 1 tablet (10 mg total) by mouth daily. 07/19/17 07/19/18 Yes Kathi Ludwig, MD  metoprolol succinate (TOPROL-XL) 25 MG 24 hr tablet Take 1 tablet (25 mg total) by mouth daily. 07/19/17  Yes Kathi Ludwig, MD  predniSONE (DELTASONE) 20 MG tablet Take 2 tablets (40 mg total) by mouth daily. 08/17/17  Yes Horton, Barbette Hair, MD  vitamin B-12 (CYANOCOBALAMIN) 100 MCG tablet Take 100 mcg by mouth daily.   Yes [provider]  doxycycline (VIBRAMYCIN) 100 MG capsule Take 1 capsule (100 mg total) by mouth 2 (two) times daily. Patient not taking: Reported on  09/07/2017 08/17/17   Horton, Barbette Hair, MD  predniSONE (DELTASONE) 20 MG tablet 2 tabs po daily x 4 days Patient not taking: Reported on 09/08/2017 09/07/17   Charlesetta Shanks, MD    Family History Family History  Problem Relation Age of Onset  . Heart disease Mother        age 14's  . Hypertension Mother   . Aneurysm Father        brain    Social History Social History  Substance Use Topics  . Smoking status: Former Smoker    Packs/day: 1.00    Types: Cigarettes  . Smokeless  tobacco: Never Used     Comment: quit 07/12/17  . Alcohol use Yes     Comment: occasional alcohol     Allergies   Patient has no known allergies.   Review of Systems Review of Systems  Constitutional: Negative for fever.  Respiratory: Positive for cough, chest tightness and shortness of breath.   Cardiovascular: Negative for chest pain and leg swelling.  Gastrointestinal: Positive for vomiting (once last night). Negative for abdominal pain.  All other systems reviewed and are negative.    Physical Exam Updated Vital Signs BP (!) 120/92   Pulse (!) 109   Resp (!) 21   Ht 5\' 9"  (1.753 m)   Wt 59 kg (130 lb)   LMP 09/09/2014 (Approximate)   SpO2 97%   BMI 19.20 kg/m   Physical Exam  Constitutional: She is oriented to person, place, and time. She appears well-developed and well-nourished.  HENT:  Head: Normocephalic and atraumatic.  Right Ear: External ear normal.  Left Ear: External ear normal.  Nose: Nose normal.  Eyes: Right eye exhibits no discharge. Left eye exhibits no discharge.  Cardiovascular: Regular rhythm and normal heart sounds.  Tachycardia present.   Pulmonary/Chest: Effort normal. No accessory muscle usage. No tachypnea. She has no decreased breath sounds. She has wheezes (diffuse expiratory).  Abdominal: Soft. There is no tenderness.  Musculoskeletal: She exhibits no edema.  Neurological: She is alert and oriented to person, place, and time.  Skin: Skin is warm and dry.  Nursing note and vitals reviewed.    ED Treatments / Results  Labs (all labs ordered are listed, but only abnormal results are displayed) Labs Reviewed  BASIC METABOLIC PANEL - Abnormal; Notable for the following:       Result Value   Potassium 3.2 (*)    Glucose, Bld 109 (*)    All other components within normal limits  CBC WITH DIFFERENTIAL/PLATELET  BRAIN NATRIURETIC PEPTIDE  D-DIMER, QUANTITATIVE (NOT AT [facility])  I-STAT TROPONIN, ED    EKG  EKG  Interpretation  Date/Time:  Wednesday September 08 2017 09:55:11 EDT Ventricular Rate:  119 PR Interval:    QRS Duration: 85 QT Interval:  323 QTC Calculation: 455 R Axis:   54 Text Interpretation:  Sinus tachycardia Ventricular premature complex Aberrant complex Right atrial enlargement Anterior infarct, old rate is faster, otherwise similar to Sep 07 2017 Confirmed by Sherwood Gambler [phone]) on 09/08/2017 10:00:59 AM Also confirmed by Sherwood Gambler [phone])  on 09/08/2017 10:13:00 AM Also confirmed by Sherwood Gambler [phone]), editor Laurena Spies [phone])  on 09/08/2017 10:44:17 AM      ED ECG REPORT   Date: 09/08/2017  Rate: 119  Rhythm: sinus tachycardia  QRS Axis: normal  Intervals: normal  ST/T Wave abnormalities: normal  Conduction Disutrbances:none  Narrative Interpretation: Rate is faster compared to yesterday.  Old EKG Reviewed: changes noted  I have personally reviewed the EKG tracing and agree with the computerized printout as noted.   Radiology Dg Chest 2 View  Result Date: 09/07/2017 CLINICAL DATA:  Acute onset of shortness of breath. Initial encounter. EXAM: CHEST  2 VIEW COMPARISON:  Chest radiograph performed 08/16/2017 FINDINGS: The lungs are well-aerated and clear. There is no evidence of focal opacification, pleural effusion or pneumothorax. The heart is normal in size; the mediastinal contour is within normal limits. No acute osseous abnormalities are seen. IMPRESSION: No acute cardiopulmonary process seen. Electronically Signed   By: Garald Balding M.D.   On: 09/07/2017 03:57   Dg Chest Port 1 View  Result Date: 09/08/2017 CLINICAL DATA:  Shortness of breath EXAM: PORTABLE CHEST 1 VIEW COMPARISON:  09/07/2017 FINDINGS: Normal heart size and mediastinal contours. Large lung volumes correlating with history of COPD. There is no edema, consolidation, effusion, or pneumothorax. EKG leads create artifact over the chest. IMPRESSION: COPD without acute  superimposed finding. Electronically Signed   By: Monte Fantasia M.D.   On: 09/08/2017 10:32    Procedures Procedures (including critical care time)  Medications Ordered in ED Medications  albuterol (PROVENTIL,VENTOLIN) solution continuous neb (0 mg/hr Nebulization Stopped 09/08/17 1127)  enoxaparin (LOVENOX) injection 40 mg (not administered)  lisinopril (PRINIVIL,ZESTRIL) tablet 10 mg (not administered)  metoprolol succinate (TOPROL-XL) 24 hr tablet 25 mg (not administered)  ipratropium-albuterol (DUONEB) 0.5-2.5 (3) MG/3ML nebulizer solution 3 mL (not administered)  ipratropium (ATROVENT) nebulizer solution 0.5 mg (0.5 mg Nebulization Given 09/08/17 1027)  sodium chloride 0.9 % bolus 1,000 mL (0 mLs Intravenous Stopped 09/08/17 1230)  potassium chloride SA (K-DUR,KLOR-CON) CR tablet 40 mEq (40 mEq Oral Given 09/08/17 1123)  furosemide (LASIX) injection 40 mg (40 mg Intravenous Given 09/08/17 1431)     Initial Impression / Assessment and Plan / ED Course  I have reviewed the triage vital signs and the nursing notes.  Pertinent labs & imaging results that were available during my care of the patient were reviewed by me and considered in my medical decision making (see chart for details).     Patient's shortness of breath is only minimally better after a continuous albuterol treatment.  However she is not in distress.  Given this, d-dimer sent to help rule out pulmonary embolism.  This is negative and I think her presentation otherwise is most consistent with COPD.  She does continue to have wheezing and shortness of breath and thus will need admission for further care.  Has already received steroids and magnesium.  She does not appear to have respiratory failure that would need BiPAP or intubation.  Admit to the internal medicine teaching service.  Final Clinical Impressions(s) / ED Diagnoses   Final diagnoses:  COPD exacerbation ([place])    New Prescriptions New Prescriptions    No medications on file     Sherwood Gambler, MD 09/08/17 [phone]

## 2017-09-08 NOTE — ED Notes (Signed)
ED Provider at bedside. Pt continues to have wheezing and labored breathing.

## 2017-09-08 NOTE — ED Notes (Signed)
Ginger ale given to pt.  

## 2017-09-09 DIAGNOSIS — R0602 Shortness of breath: Secondary | ICD-10-CM | POA: Diagnosis present

## 2017-09-09 LAB — CBC
HEMATOCRIT: 38.1 % (ref 36.0–46.0)
HEMOGLOBIN: 12.4 g/dL (ref 12.0–15.0)
MCH: 28.2 pg (ref 26.0–34.0)
MCHC: 32.5 g/dL (ref 30.0–36.0)
MCV: 86.6 fL (ref 78.0–100.0)
Platelets: 331 10*3/uL (ref 150–400)
RBC: 4.4 MIL/uL (ref 3.87–5.11)
RDW: 14.6 % (ref 11.5–15.5)
WBC: 13.3 10*3/uL — ABNORMAL HIGH (ref 4.0–10.5)

## 2017-09-09 LAB — BASIC METABOLIC PANEL
Anion gap: 9 (ref 5–15)
BUN: 13 mg/dL (ref 6–20)
CHLORIDE: 105 mmol/L (ref 101–111)
CO2: 26 mmol/L (ref 22–32)
CREATININE: 0.62 mg/dL (ref 0.44–1.00)
Calcium: 9.6 mg/dL (ref 8.9–10.3)
GFR calc Af Amer: >60 mL/min (ref >60)
GFR calc non Af Amer: >60 mL/min (ref >60)
GLUCOSE: 99 mg/dL (ref 65–99)
POTASSIUM: 4.1 mmol/L (ref 3.5–5.1)
Sodium: 140 mmol/L (ref 135–145)

## 2017-09-09 MED ORDER — IPRATROPIUM-ALBUTEROL 0.5-2.5 (3) MG/3ML IN SOLN
3.0000 mL | RESPIRATORY_TRACT | Status: DC
  Administered 2017-09-09 – 2017-09-13 (×23): 3 mL via RESPIRATORY_TRACT
  Filled 2017-09-09 (×23): qty 3

## 2017-09-09 MED ORDER — LEVALBUTEROL HCL 1.25 MG/0.5ML IN NEBU
1.2500 mg | INHALATION_SOLUTION | Freq: Once | RESPIRATORY_TRACT | Status: AC
  Administered 2017-09-09: 1.25 mg via RESPIRATORY_TRACT

## 2017-09-09 MED ORDER — ALBUTEROL SULFATE (2.5 MG/3ML) 0.083% IN NEBU: 2.5000 mg | INHALATION_SOLUTION | RESPIRATORY_TRACT | Status: DC | PRN

## 2017-09-09 MED ORDER — MAGNESIUM SULFATE 2 GM/50ML IV SOLN
2.0000 g | Freq: Once | INTRAVENOUS | Status: AC
  Administered 2017-09-10: 2 g via INTRAVENOUS
  Filled 2017-09-09: qty 50

## 2017-09-09 MED ORDER — LEVALBUTEROL HCL 1.25 MG/0.5ML IN NEBU
INHALATION_SOLUTION | RESPIRATORY_TRACT | Status: AC
  Filled 2017-09-09: qty 0.5

## 2017-09-09 MED ORDER — METHYLPREDNISOLONE SODIUM SUCC 125 MG IJ SOLR
80.0000 mg | Freq: Once | INTRAMUSCULAR | Status: AC
  Administered 2017-09-10: 80 mg via INTRAVENOUS
  Filled 2017-09-09: qty 2

## 2017-09-09 MED ORDER — FUROSEMIDE 10 MG/ML IJ SOLN
20.0000 mg | Freq: Once | INTRAMUSCULAR | Status: AC
  Administered 2017-09-09: 20 mg via INTRAVENOUS
  Filled 2017-09-09: qty 2

## 2017-09-09 MED ORDER — FUROSEMIDE 10 MG/ML IJ SOLN
20.0000 mg | Freq: Once | INTRAMUSCULAR | Status: AC
  Administered 2017-09-10: 20 mg via INTRAVENOUS

## 2017-09-09 MED ORDER — ALBUTEROL SULFATE (2.5 MG/3ML) 0.083% IN NEBU
2.5000 mg | INHALATION_SOLUTION | RESPIRATORY_TRACT | Status: DC | PRN
  Administered 2017-09-10: 2.5 mg via RESPIRATORY_TRACT
  Filled 2017-09-09: qty 3

## 2017-09-09 MED ORDER — PREDNISONE 20 MG PO TABS
40.0000 mg | ORAL_TABLET | Freq: Every day | ORAL | Status: DC
  Administered 2017-09-09: 40 mg via ORAL
  Filled 2017-09-09: qty 2

## 2017-09-09 MED ORDER — ALBUTEROL SULFATE (2.5 MG/3ML) 0.083% IN NEBU: 2.5000 mg | INHALATION_SOLUTION | RESPIRATORY_TRACT | Status: DC

## 2017-09-09 MED ORDER — FUROSEMIDE 20 MG PO TABS
20.0000 mg | ORAL_TABLET | Freq: Every day | ORAL | Status: DC
  Administered 2017-09-09: 20 mg via ORAL
  Filled 2017-09-09 (×2): qty 1

## 2017-09-09 MED ORDER — ZOLPIDEM TARTRATE 5 MG PO TABS
5.0000 mg | ORAL_TABLET | Freq: Every evening | ORAL | Status: DC | PRN
  Administered 2017-09-09: 5 mg via ORAL
  Filled 2017-09-09: qty 1

## 2017-09-09 NOTE — Progress Notes (Signed)
Internal Medicine Attending:   I saw and examined the patient. I reviewed the resident's note and I agree with the resident's findings and plan as documented in the resident's note.  Acute hypoxic respiratory failure most likely due to reactive airway disease given wheezing heard on exam today. Will plan for scheduled bronchodilators and prednisone. Her volume status looks stable, she is at her dry weight, no edema seen, can continue with home furosemide dosing.

## 2017-09-09 NOTE — Progress Notes (Signed)
   Subjective:  Patient was seen sitting comfortably in bed in no acute distress. Patient continues to endorse wheezing and SOB this AM which has not significantly improved from yesterday with IV diuresis or PRN DuoNebs.   Objective:  Vital signs in last 24 hours: Vitals:   09/09/17 0503 09/09/17 0748 09/09/17 1042 09/09/17 1146  BP: 123/79  (!) 131/92 (!) 117/96  Pulse: 97  80 99  Resp: 18   18  Temp: 98.3 F (36.8 C)   98 F (36.7 C)  TempSrc: Oral   Oral  SpO2: 97% 98%  98%  Weight: 124 lb 11.2 oz (56.6 kg)     Height:       Physical Exam  Constitutional: She appears well-developed and well-nourished. No distress.  HENT:  Mouth/Throat: Oropharynx is clear and moist.  Eyes: Conjunctivae and EOM are normal.  Cardiovascular: Normal rate and regular rhythm.  Exam reveals no friction rub.   No murmur heard. Pulmonary/Chest: No stridor.  Patient mildly out of breath with speaking in full sentences. Mild accessory muscle use without nasal flaring. No signs of cyanosis. Diffuses inspiratory and expiratory wheezing in all lung fields.   Abdominal: Soft. She exhibits no distension. There is no tenderness. There is no guarding.  Musculoskeletal: She exhibits no edema (of bilateral lower extremities) or tenderness (of bilateral lower extremities).  Skin: Skin is warm and dry. Capillary refill takes less than 2 seconds. No rash noted. No erythema.   Assessment/Plan:  Active Problems:   Shortness of breath  Caitlin [name] is a 50 yo with a PMH of CHF (LVEF 40-45%, grade 2 DD on 06/2017 echo) and COPD who presented with acute onset SOB that progressively worsened during the 72 hours leading up to presentation. On arrival the patient was given continuous albuterol, IV solumedrol, and magnesium. Patient was admitted to the internal medicine teaching service for management. The specific problems addressed during admission were as follows:  Shortness of breath with hypoxia 2/2 COPD vs CHF  exacerbation: The patient remains short of breath and diffusely wheezing on exam. Will continue treatment for COPD exacerbation with scheduled DuoNebs every 4 hours. Patient's breathing not much changed after administration of lasix yesterday, but given history of heart failure will obtained echocardiography to rule out worsening of chronic heart failure.  -Telemetry while inpatient -DuoNebs q4 hours -Prednisone 40 mg daily -Continue home lasix 20 mg daily -Continue home metoprolol succinate 25 mg daily for CHF -Follow up echocardiography today  HTN: Patient's BP within normal limits today.  -Continue home lisinopril 10 mg daily  Fluids: None VTE prophylaxis: Lovenox 40 mg Diet: Heart Healthy diet  Dispo: Anticipated discharge in approximately 2-3 day(s) pending clinical improvement.  Thomasene Ripple, MD 09/09/2017, 2:44 PM Pager: [phone]

## 2017-09-09 NOTE — Plan of Care (Signed)
Problem: Safety: Goal: Ability to remain free from injury will improve Outcome: Progressing Encouraged patient to take frequent rest break during ambulation and to sit down if she feels dizzy or lightheaded  Problem: Pain Managment: Goal: General experience of comfort will improve Outcome: Progressing Monitored patient's pain level using 0-10 pain scale.  Encouraged patient to notify nurse if any chest pain or discomfort that starts during SOB episodes

## 2017-09-09 NOTE — Progress Notes (Addendum)
NIGHT FLOAT INTERVAL PROGRESS NOTE  Paged by RN at approximately 11:30 pm that patient was in respiratory distress with diffuse wheezing which started after pt tried to ambulate to the bathroom. Upon arrival to room, several RNs and rapid response RN present at beside. Patient tripoding leaning over her bedside table with audible wheezing across the room. Saturating 100% on 2L O2 however was reportedly saturating well on RA prior to this. Received PO Pred 40 mg at 1 pm, PO lasix 20 mg at 4 pm, duoneb at 11 pm. Took Ambien at 10 pm.  Exam: Vitals: Tachycardic 130's-140's. O2 99-100% on 2L via [place]. BP 160's/90's.  Gen: Distressed-appearing african american woman leaning over bedside table. Breathing labored. Looks fatigued. No cyanosis. Cardiac: Tachycardic, 130-140.  Lung: Tachypnea with moderate use of accessory muscles. Diffuse expiratory wheezing throughout. Crackles throughout. Pursed lip breathing EXT: No significant peripheral edema appreciated. Warm and perfused.   Assessment & Plan: 51 y/o F here with COPD exacerbation vs acute on chronic CHF. Seems to have responded well to continuous albuterol and IV lasix historically despite her euvolemic apperance. Xopenex nebulizer given with objective improvement in WOB however patient denied improvement. Then administered IV Solumedrol 80mg , IV Lasix 40mg  and IV Mag 2 g. Within 10 minutes had significant improvement of her symptoms and was breathing more comfortably. Ambien likely contributing to her fatigue as well although believe large part due to increased WOB. I feel patient would benefit from transfer to SDU. -Order placed for transfer to SDU, in case patient should require further intervention such as BiPAP or continuous nebs, which cannot be used in her current level of care. -Greatly appreciate nursing staff, RT and RR RN for their assistance.  -RN to keep in touch regarding changes in clinical status  RECEIVED: -Xopenex neb treatment -IV  Lasix 40 mg -IV Solumedrol 80mg   -IV Mag 2 g  Marka Treloar, DO Internal Medicine - PGY2

## 2017-09-10 ENCOUNTER — Inpatient Hospital Stay (HOSPITAL_COMMUNITY): Payer: Self-pay

## 2017-09-10 DIAGNOSIS — J449 Chronic obstructive pulmonary disease, unspecified: Secondary | ICD-10-CM

## 2017-09-10 DIAGNOSIS — I5032 Chronic diastolic (congestive) heart failure: Secondary | ICD-10-CM

## 2017-09-10 DIAGNOSIS — Z9989 Dependence on other enabling machines and devices: Secondary | ICD-10-CM

## 2017-09-10 DIAGNOSIS — J441 Chronic obstructive pulmonary disease with (acute) exacerbation: Secondary | ICD-10-CM

## 2017-09-10 DIAGNOSIS — I110 Hypertensive heart disease with heart failure: Secondary | ICD-10-CM

## 2017-09-10 DIAGNOSIS — I361 Nonrheumatic tricuspid (valve) insufficiency: Secondary | ICD-10-CM

## 2017-09-10 DIAGNOSIS — J9600 Acute respiratory failure, unspecified whether with hypoxia or hypercapnia: Secondary | ICD-10-CM

## 2017-09-10 LAB — POCT I-STAT 3, ART BLOOD GAS (G3+)
Bicarbonate: 28.9 mmol/L — ABNORMAL HIGH (ref 20.0–28.0)
O2 Saturation: 100 %
PH ART: 7.236 — AB (ref 7.350–7.450)
PO2 ART: 518 mmHg — AB (ref 83.0–108.0)
TCO2: 31 mmol/L (ref 22–32)
pCO2 arterial: 68 mmHg (ref 32.0–48.0)

## 2017-09-10 LAB — HEPATIC FUNCTION PANEL
ALT: 18 U/L (ref 14–54)
AST: 23 U/L (ref 15–41)
Albumin: 4.5 g/dL (ref 3.5–5.0)
Alkaline Phosphatase: 60 U/L (ref 38–126)
TOTAL PROTEIN: 8.1 g/dL (ref 6.5–8.1)
Total Bilirubin: 0.8 mg/dL (ref 0.3–1.2)

## 2017-09-10 LAB — RESPIRATORY PANEL BY PCR
Adenovirus: NOT DETECTED
BORDETELLA PERTUSSIS-RVPCR: NOT DETECTED
CHLAMYDOPHILA PNEUMONIAE-RVPPCR: NOT DETECTED
CORONAVIRUS HKU1-RVPPCR: NOT DETECTED
Coronavirus 229E: NOT DETECTED
Coronavirus NL63: NOT DETECTED
Coronavirus OC43: NOT DETECTED
Influenza A: NOT DETECTED
Influenza B: NOT DETECTED
MYCOPLASMA PNEUMONIAE-RVPPCR: NOT DETECTED
Metapneumovirus: NOT DETECTED
Parainfluenza Virus 1: NOT DETECTED
Parainfluenza Virus 2: NOT DETECTED
Parainfluenza Virus 3: NOT DETECTED
Parainfluenza Virus 4: NOT DETECTED
RHINOVIRUS / ENTEROVIRUS - RVPPCR: NOT DETECTED
Respiratory Syncytial Virus: NOT DETECTED

## 2017-09-10 LAB — MRSA PCR SCREENING: MRSA by PCR: NEGATIVE

## 2017-09-10 LAB — GLUCOSE, CAPILLARY
GLUCOSE-CAPILLARY: 126 mg/dL — AB (ref 65–99)
GLUCOSE-CAPILLARY: 134 mg/dL — AB (ref 65–99)
Glucose-Capillary: 122 mg/dL — ABNORMAL HIGH (ref 65–99)
Glucose-Capillary: 143 mg/dL — ABNORMAL HIGH (ref 65–99)

## 2017-09-10 LAB — BASIC METABOLIC PANEL
Anion gap: 12 (ref 5–15)
BUN: 16 mg/dL (ref 6–20)
CALCIUM: 10 mg/dL (ref 8.9–10.3)
CO2: 27 mmol/L (ref 22–32)
CREATININE: 0.8 mg/dL (ref 0.44–1.00)
Chloride: 102 mmol/L (ref 101–111)
GFR calc non Af Amer: >60 mL/min (ref >60)
Glucose, Bld: 120 mg/dL — ABNORMAL HIGH (ref 65–99)
Potassium: 3.8 mmol/L (ref 3.5–5.1)
SODIUM: 141 mmol/L (ref 135–145)

## 2017-09-10 LAB — PROCALCITONIN

## 2017-09-10 LAB — ECHOCARDIOGRAM COMPLETE
HEIGHTINCHES: 69 in
Weight: 1982.38 oz

## 2017-09-10 MED ORDER — MIDAZOLAM HCL 2 MG/2ML IJ SOLN
2.0000 mg | INTRAMUSCULAR | Status: AC | PRN
  Administered 2017-09-10 (×4): 2 mg via INTRAVENOUS
  Filled 2017-09-10 (×2): qty 2

## 2017-09-10 MED ORDER — PANTOPRAZOLE SODIUM 40 MG IV SOLR
40.0000 mg | Freq: Two times a day (BID) | INTRAVENOUS | Status: DC
  Administered 2017-09-10 – 2017-09-13 (×7): 40 mg via INTRAVENOUS
  Filled 2017-09-10 (×10): qty 40

## 2017-09-10 MED ORDER — FENTANYL CITRATE (PF) 100 MCG/2ML IJ SOLN
100.0000 ug | INTRAMUSCULAR | Status: DC | PRN
  Administered 2017-09-10 – 2017-09-20 (×8): 100 ug via INTRAVENOUS

## 2017-09-10 MED ORDER — SODIUM CHLORIDE 0.9 % IV SOLN
0.0000 ug/min | INTRAVENOUS | Status: DC
  Filled 2017-09-10 (×2): qty 1

## 2017-09-10 MED ORDER — ORAL CARE MOUTH RINSE: 15.0000 mL | Freq: Four times a day (QID) | OROMUCOSAL | Status: DC

## 2017-09-10 MED ORDER — MIDAZOLAM HCL 50 MG/10ML IJ SOLN
1.0000 mg/h | INTRAMUSCULAR | Status: DC
  Administered 2017-09-10: 2 mg/h via INTRAVENOUS
  Administered 2017-09-13: 1 mg/h via INTRAVENOUS
  Administered 2017-09-14 (×2): 10 mg/h via INTRAVENOUS
  Administered 2017-09-14 (×2): 1 mg/h via INTRAVENOUS
  Administered 2017-09-14: 10 mg/h via INTRAVENOUS
  Administered 2017-09-15: 1 mg/h via INTRAVENOUS
  Administered 2017-09-15: 10 mg/h via INTRAVENOUS
  Administered 2017-09-15: 1 mg/h via INTRAVENOUS
  Administered 2017-09-16: 9 mg/h via INTRAVENOUS
  Administered 2017-09-16 – 2017-09-17 (×2): 10 mg/h via INTRAVENOUS
  Administered 2017-09-18: 7 mg/h via INTRAVENOUS
  Administered 2017-09-19: 6 mg/h via INTRAVENOUS
  Filled 2017-09-10: qty 10
  Filled 2017-09-10 (×11): qty 20
  Filled 2017-09-10: qty 10
  Filled 2017-09-10: qty 20

## 2017-09-10 MED ORDER — CHLORHEXIDINE GLUCONATE 0.12% ORAL RINSE (MEDLINE KIT)
15.0000 mL | Freq: Two times a day (BID) | OROMUCOSAL | Status: DC
  Administered 2017-09-10: 15 mL via OROMUCOSAL

## 2017-09-10 MED ORDER — METHYLPREDNISOLONE SODIUM SUCC 40 MG IJ SOLR
40.0000 mg | Freq: Two times a day (BID) | INTRAMUSCULAR | Status: DC
  Filled 2017-09-10: qty 1

## 2017-09-10 MED ORDER — ROCURONIUM BROMIDE 50 MG/5ML IV SOLN
1.0000 mg/kg | Freq: Once | INTRAVENOUS | Status: AC
  Administered 2017-09-10: 50 mg via INTRAVENOUS

## 2017-09-10 MED ORDER — ORAL CARE MOUTH RINSE
15.0000 mL | Freq: Four times a day (QID) | OROMUCOSAL | Status: DC
  Administered 2017-09-10 – 2017-09-23 (×50): 15 mL via OROMUCOSAL

## 2017-09-10 MED ORDER — DOCUSATE SODIUM 50 MG/5ML PO LIQD
100.0000 mg | Freq: Two times a day (BID) | ORAL | Status: DC | PRN
Start: 2017-09-10 — End: 2017-09-15

## 2017-09-10 MED ORDER — FUROSEMIDE 10 MG/ML IJ SOLN
40.0000 mg | Freq: Two times a day (BID) | INTRAMUSCULAR | Status: DC
  Administered 2017-09-10: 40 mg via INTRAVENOUS
  Filled 2017-09-10 (×2): qty 4

## 2017-09-10 MED ORDER — BISACODYL 10 MG RE SUPP: 10.0000 mg | Freq: Every day | RECTAL | Status: DC | PRN

## 2017-09-10 MED ORDER — MIDAZOLAM HCL 2 MG/2ML IJ SOLN
INTRAMUSCULAR | Status: AC
  Administered 2017-09-10: 12:00:00
  Filled 2017-09-10: qty 2

## 2017-09-10 MED ORDER — MIDAZOLAM HCL 2 MG/2ML IJ SOLN
INTRAMUSCULAR | Status: AC
  Administered 2017-09-10: 2 mg
  Filled 2017-09-10: qty 2

## 2017-09-10 MED ORDER — METHYLPREDNISOLONE SODIUM SUCC 125 MG IJ SOLR
125.0000 mg | Freq: Three times a day (TID) | INTRAMUSCULAR | Status: DC
  Administered 2017-09-10 – 2017-09-13 (×9): 125 mg via INTRAVENOUS
  Filled 2017-09-10 (×13): qty 2

## 2017-09-10 MED ORDER — SODIUM CHLORIDE 0.9 % IV BOLUS (SEPSIS)
1000.0000 mL | Freq: Once | INTRAVENOUS | Status: AC
  Administered 2017-09-10: 1000 mL via INTRAVENOUS

## 2017-09-10 MED ORDER — METHYLPREDNISOLONE SODIUM SUCC 40 MG IJ SOLR: 40.0000 mg | Freq: Two times a day (BID) | INTRAMUSCULAR | Status: DC

## 2017-09-10 MED ORDER — ETOMIDATE 2 MG/ML IV SOLN
0.3000 mg/kg | Freq: Once | INTRAVENOUS | Status: AC
Start: 2017-09-10 — End: 2017-09-10
  Administered 2017-09-10: 30 mg via INTRAVENOUS

## 2017-09-10 MED ORDER — METHYLPREDNISOLONE SODIUM SUCC 125 MG IJ SOLR
125.0000 mg | INTRAMUSCULAR | Status: AC
  Administered 2017-09-10: 125 mg via INTRAVENOUS
  Filled 2017-09-10: qty 2

## 2017-09-10 MED ORDER — FENTANYL 2500MCG IN NS 250ML (10MCG/ML) PREMIX INFUSION
0.0000 ug/h | INTRAVENOUS | Status: DC
  Administered 2017-09-10: 150 ug/h via INTRAVENOUS
  Administered 2017-09-11: 300 ug/h via INTRAVENOUS
  Administered 2017-09-11: 150 ug/h via INTRAVENOUS
  Administered 2017-09-12: 300 ug/h via INTRAVENOUS
  Administered 2017-09-12: 375 ug/h via INTRAVENOUS
  Filled 2017-09-10 (×5): qty 250

## 2017-09-10 MED ORDER — FENTANYL CITRATE (PF) 100 MCG/2ML IJ SOLN
INTRAMUSCULAR | Status: AC
  Administered 2017-09-10: 100 ug
  Filled 2017-09-10: qty 2

## 2017-09-10 MED ORDER — PROPOFOL 1000 MG/100ML IV EMUL: 5.0000 ug/kg/min | INTRAVENOUS | Status: DC

## 2017-09-10 MED ORDER — FENTANYL CITRATE (PF) 100 MCG/2ML IJ SOLN
100.0000 ug | Freq: Once | INTRAMUSCULAR | Status: AC
  Administered 2017-09-10: 100 ug via INTRAVENOUS

## 2017-09-10 MED ORDER — SODIUM CHLORIDE 0.9 % IV BOLUS (SEPSIS)
500.0000 mL | Freq: Once | INTRAVENOUS | Status: AC
  Administered 2017-09-10: 500 mL via INTRAVENOUS

## 2017-09-10 MED ORDER — CHLORHEXIDINE GLUCONATE 0.12% ORAL RINSE (MEDLINE KIT)
15.0000 mL | Freq: Two times a day (BID) | OROMUCOSAL | Status: DC
  Administered 2017-09-10 – 2017-09-23 (×26): 15 mL via OROMUCOSAL

## 2017-09-10 MED ORDER — FENTANYL CITRATE (PF) 100 MCG/2ML IJ SOLN
100.0000 ug | INTRAMUSCULAR | Status: AC | PRN
  Administered 2017-09-12 – 2017-09-13 (×3): 100 ug via INTRAVENOUS
  Filled 2017-09-10 (×6): qty 2

## 2017-09-10 MED ORDER — PROPOFOL 1000 MG/100ML IV EMUL
0.0000 ug/kg/min | INTRAVENOUS | Status: DC
  Administered 2017-09-10: 50 ug/kg/min via INTRAVENOUS
  Administered 2017-09-10: 10 ug/kg/min via INTRAVENOUS
  Administered 2017-09-11: 50 ug/kg/min via INTRAVENOUS
  Administered 2017-09-11: 30 ug/kg/min via INTRAVENOUS
  Administered 2017-09-11: 40 ug/kg/min via INTRAVENOUS
  Administered 2017-09-12 – 2017-09-13 (×7): 50 ug/kg/min via INTRAVENOUS
  Filled 2017-09-10 (×13): qty 100

## 2017-09-10 MED ORDER — PREDNISONE 20 MG PO TABS
40.0000 mg | ORAL_TABLET | Freq: Every day | ORAL | Status: DC
  Filled 2017-09-10: qty 2

## 2017-09-10 NOTE — Progress Notes (Signed)
Patient received from [facility]  during Rapid response. Patient having labored breathing.Patient saturating 100% on 2L oxygen. Dr Einar Gip at bedside placing orders.  Will continue to monitor patient

## 2017-09-10 NOTE — Progress Notes (Signed)
RT NOTE:  RT initiated BIPAP for inc. WOB, SOB, RR 28-30, HR 135, audible wheezing noted. Pt w/orthopnea. Pt agrees BIPAP helped immediately. RT left room for 15 min to report to RN and upon return patient was sleeping. HR slowly decreasing, RR 18, SpO2 100%. RN aware of audible wheezing, RN will update MD if HR remains elevated. RT spoke with pt's family and educated them about BIPAP therapy. RT will monitor. RN will call RT of any changes.

## 2017-09-10 NOTE — Progress Notes (Signed)
RT NOTE:  RT called to beside by Rapid Response for resp. Distress. Upon arrival pt w/ increase WOB, SOB, audible expiratory Wheeze & crackles throughout. Pt had just gotten up to bedside toilet and did not recover from Duoneb treatment. Xopenex 1.25 administered. Pt also given IV steroids and Lasix by RN. BIPAP prn ordered. Pt is being transferred to stepdown unit. Xopenex did give little relief to patient. Rapid Response RN will contact RT will location of transfer and RT will meet and reassess for BIPAP. RT will report and follow.

## 2017-09-10 NOTE — Procedures (Signed)
Intubation Procedure Note Caitlin [name] [id number] 20-Aug-1966  Procedure: Intubation Indications: Respiratory insufficiency  Procedure Details Consent: Risks of procedure as well as the alternatives and risks of each were explained to the (patient/caregiver).  Consent for procedure obtained. Time Out: Verified patient identification, verified procedure, site/side was marked, verified correct patient position, special equipment/implants available, medications/allergies/relevent history reviewed, required imaging and test results available.  Performed  Maximum sterile technique was used including mask.  3   Evaluation Hemodynamic Status: BP stable throughout; O2 sats: stable throughout Patient's Current Condition: stable Complications: No apparent complications Patient did tolerate procedure well. Chest X-ray ordered to verify placement.  CXR: pending.   [employer] 09/10/2017

## 2017-09-10 NOTE — Significant Event (Signed)
Rapid Response Event Note During rounds RN asked for a secod opinion. Pt with increase WOB and wheezing  Overview: Time Called: 2330 Arrival Time: 2330 Event Type: Respiratory  Initial Focused Assessment: On arrival pt sitting on side of bed leaning over bedside table, audible wheezing heard from entry of doorway, skin warm and dry, alert and oriented x3, unable to speak in complete sentences. Pt just received a duo neb at 2306 after returning from bathroom. Per Maudie Mercury RT pt was SOB at shift change but py had quickly worsened since last breathing treatment. O2 sats 96% on RA, HR 130-140's.  Dr. Danford Bad to bedside  Interventions: IVP Lasix 40 mg, Solumedrol 80 mg, Xopenex neb treatment 1.25, magnesium 2g gtt  Plan of Care (if not transferred): Transfer 4e25 Event Summary: Name of Physician Notified: Dr. Danford Bad at 2333    at    Outcome: Transferred (Comment)     Gevena Mart, Sela Hua

## 2017-09-10 NOTE — Progress Notes (Signed)
  Echocardiogram 2D Echocardiogram has been performed.  Caitlin [name] M 09/10/2017, 2:45 PM

## 2017-09-10 NOTE — Consult Note (Signed)
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  She also has a 30-pack-year history of smoking and continues to smoke.  She been living with her sister and making progress for rehabilitation standpoint.  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  She took her rescue bronchodilators without much relief because of this she went to the emergency room.  She was given bronchodilator therapy, and sent home with a prescription of prednisone.  Her symptoms did not get better, she returned to the emergency room on the 24th with resting shortness of breath, and almost continuous use of her rescue bronchodilator without relief.  Her initial chest x-ray was without infiltrate or evidence of edema, she had no sick exposures, no fever or chills.  She was admitted with a working diagnosis of acute exacerbation chronic obstructive pulmonary disease, and treated appropriately with supplemental oxygen, inhaled bronchodilators, and systemic steroids.  On the evening of 10/25 she developed acute distress, was found by nursing staff tripod in bed.  Was treated with IV Lasix, pulse IV steroid, and placed on noninvasive positive pressure ventilation.  Throughout the course of the evening and going into the a.m. hours of 10/26.  Her work of breathing has remained quite elevated.  She has been on and off BiPAP, continues to have cough, continues to wheeze, has not been getting much relief from rescue bronchodilation.  Critical care was asked to see because of  work of breathing, noninvasive positive pressure ventilation requirements, and concern about clinical deterioration.  PAST MEDICAL HISTORY :  She  has a past medical history of CHF (congestive heart failure) ([place]); COPD (chronic obstructive pulmonary disease) ([place]); and NSTEMI (non-ST elevated myocardial infarction) ([place]).  PAST SURGICAL HISTORY: She  has a past surgical history that includes LEFT HEART CATH AND CORONARY ANGIOGRAPHY (N/A, 07/15/2017).  No Known Allergies  No current facility-administered medications on file prior to encounter.    Current Outpatient Prescriptions on File Prior to Encounter  Medication Sig  . albuterol (PROVENTIL HFA;VENTOLIN HFA) 108 (90 Base) MCG/ACT inhaler Inhale 2 puffs into the lungs every 4 (four) hours as needed for wheezing or shortness of breath.  Marland Kitchen albuterol (PROVENTIL) (2.5 MG/3ML) 0.083% nebulizer solution Take 3 mLs (2.5 mg total) by nebulization every 6 (six) hours as needed for wheezing or shortness of breath.  . furosemide (LASIX) 20 MG tablet Take 1 tablet (20 mg total) by mouth daily.  Marland Kitchen ipratropium (ATROVENT) 0.02 % nebulizer solution Take 2.5 mLs (0.5 mg total) by nebulization 4 (four) times daily.  Marland Kitchen lisinopril (PRINIVIL,ZESTRIL) 10 MG tablet Take 1 tablet (10 mg total) by mouth daily.  . metoprolol succinate (TOPROL-XL) 25 MG 24 hr tablet Take 1 tablet (25 mg total) by mouth daily.  . predniSONE (DELTASONE) 20 MG tablet Take 2 tablets (40 mg total) by mouth daily.  . vitamin B-12 (CYANOCOBALAMIN) 100 MCG tablet Take 100 mcg by mouth daily.  Marland Kitchen doxycycline (VIBRAMYCIN) 100 MG capsule Take 1 capsule (100 mg total) by mouth 2 (two) times daily. (Patient not taking: Reported on 09/07/2017)  . predniSONE (DELTASONE) 20  MG tablet 2 tabs po daily x 4 days (Patient not taking: Reported on 09/08/2017)    FAMILY HISTORY:  Her indicated that her mother is deceased. She indicated that her father is deceased.    SOCIAL HISTORY: She  reports that  she has quit smoking. Her smoking use included Cigarettes. She smoked 1.00 pack per day. She has never used smokeless tobacco. She reports that she drinks alcohol. She reports that she uses drugs, including Marijuana.  REVIEW OF SYSTEMS:   General.  Denies fever, chills, sick exposure HEENT: Some sinus congestion, some sore throat, positive for postnasal drip positive for cough pulmonary: Positive for shortness of breath this is resting, chest pain with cough, cough nonproductive.  Positive for wheezing no hemoptysis.  Cardiac tachycardia, chest pain is noted, no palpitations.  Musculoskeletal: No pain, no strength loss.  Neuro: At baseline endocrine: At baseline  SUBJECTIVE:  Marked shortness of breath some relief with administration of noninvasive ventilation  VITAL SIGNS: BP (!) 130/92 (BP Location: Right Arm)   Pulse (!) 107   Temp (!) 96.9 F (36.1 C) (Axillary)   Resp 16   Ht 5\' 9"  (1.753 m)   Wt 123 lb 8 oz (56 kg)   LMP 09/09/2014 (Approximate)   SpO2 100%   BMI 18.24 kg/m   HEMODYNAMICS:    VENTILATOR SETTINGS: FiO2 (%):  [40 %] 40 %  INTAKE / OUTPUT: I/O last 3 completed shifts: In: 90 [P.O.:720] Out: 39 [Urine:1540]  PHYSICAL EXAMINATION: General: This is a well-nourished 51 year old African-American female currently in the tripod position in spite of being placed on non-invasive ventilation Neuro: Awake oriented no focal deficits HEENT: Normocephalic atraumatic no jugular venous distention there is a loud audible expiratory upper airway wheeze.  Her posterior pharynx is slightly erythemic she has clear postnasal drainage Cardiovascular: Tachycardic with sinus tachycardia on telemetry regular rhythm no murmur rub or gallop Lungs: Diffuse expiratory wheeze marked accessory muscle use unable to lie flat Abdomen: Soft nontender no organomegaly positive bowel sounds Musculoskeletal: Equal strength and bulk Skin: Warm dry brisk cap refill no peripheral  edema  LABS:  BMET  Recent Labs Lab 09/08/17 1040 09/09/17 0233 09/10/17 0248  NA 141 140 141  K 3.2* 4.1 3.8  CL 108 105 102  CO2 24 26 27   BUN 8 13 16   CREATININE 0.57 0.62 0.80  GLUCOSE 109* 99 120*    Electrolytes  Recent Labs Lab 09/08/17 1040 09/09/17 0233 09/10/17 0248  CALCIUM 9.5 9.6 10.0    CBC  Recent Labs Lab 09/07/17 0309 09/08/17 1040 09/09/17 0233  WBC 7.9 9.7 13.3*  HGB 12.5 13.1 12.4  HCT 37.5 39.3 38.1  PLT 295 325 331    Coag's No results for input(s): APTT, INR in the last 168 hours.  Sepsis Markers No results for input(s): LATICACIDVEN, PROCALCITON, O2SATVEN in the last 168 hours.  ABG No results for input(s): PHART, PCO2ART, PO2ART in the last 168 hours.  Liver Enzymes No results for input(s): AST, ALT, ALKPHOS, BILITOT, ALBUMIN in the last 168 hours.  Cardiac Enzymes  Recent Labs Lab 09/07/17 0309  TROPONINI <0.03    Glucose No results for input(s): GLUCAP in the last 168 hours.  Imaging No results found.   STUDIES:  ECHO 10/26>>>  CULTURES: Urine strep antigen 10/26 Urine Legionella antigen 10/26 Respiratory viral panel 10/26  ANTIBIOTICS:   SIGNIFICANT EVENTS:   LINES/TUBES:   DISCUSSION: 51 year old female now being transferred to the intensive care with a working diagnosis of  acute exacerbation of chronic obstructive pulmonary disease.  She appears to be in acute bronchospasm at this point.  She does have a history of heart failure with reduced ejection fraction, however she does not appear to be volume overloaded.  At this point we will place her back on positive pressure ventilation, this is been done.  We will give her pulse IV Solu-Medrol, and she has been administered to rescue bronchodilator.  We will obtain a repeat ABG, and reassess her clinically, her risk of requiring intubation is very high.  We will continue supportive care as outlined below  ASSESSMENT / PLAN:  PULMONARY A: Acute  Hypoxic respiratory failure in setting of AECOPD (presumed; no PFTs on record but has heavy smoking hx) Appears to be upper airway component -tripod position now, marked accessory use.  -tachycardia.  -CXR on admit did not show edema.  P:   STAT neb Placed back on BIPAP STAT IV 125 solumedrol ABG 1/2 hr. After transfer. Likely will need intubation  Cont q4 BDs Changed pred to IV solumedrol 40 q 12 Repeat CXR after intubation  CARDIOVASCULAR A:  H/o HFrEF (40-45% in Aug 2018) Sinus tachycardia -BNP negative  P:  Cont tele  Repeat ECHO (pending) Holding oral BB; if intubated and remains tachycardia will add low dose scheduled IV lopressor  Cycle CEs Daily BNP  RENAL A:   No acute  P:   Trend chemistry  KVO IVF for now.   GASTROINTESTINAL A:   No acute  P:   NPO PPI for SUP  HEMATOLOGIC A:   No acute  P:  Cont LMWH Trend cbc Transfuse per protocol   INFECTIOUS A:   R/o URI P:   Send RVP Send urine strep and legionella antigen Sputum culture if intubated PCT algo Holding off on abx   ENDOCRINE A:   Mild hyperglycemia  P:   ssi   NEUROLOGIC A:   Anxiety  P:   RASS goal: -2   PAD protocol w/ diprivan and PRN fent    FAMILY  - Updates:   - Inter-disciplinary family meet or Palliative Care meeting due by:  7 days  My critical care time 40 minutes  Erick Colace ACNP-BC [place] Pager # [phone] OR # [phone] if no answer   09/10/2017, 11:12 AM

## 2017-09-10 NOTE — Progress Notes (Signed)
RT note: RT transported patient on BIPAP from 4E24 to 2M04. Vital signs stable at this time.

## 2017-09-10 NOTE — Progress Notes (Addendum)
Patient transported to 4E25 via bed on 2L oxygen. Patient report  and  belongings given to Davonna Belling  RN

## 2017-09-10 NOTE — Progress Notes (Signed)
   Subjective:  Patient was seen sleeping in bed wearing BiPAP mask in no acute distress. Later returned to room to discuss overnight events with patient. Patient was taken off BiPAP and unable to speak in full sentences during conversation. Patient's HR elevated with audible wheezing appreciated throughout conversation.   Objective:  Vital signs in last 24 hours: Vitals:   09/10/17 0200 09/10/17 0210 09/10/17 0330 09/10/17 0750  BP:  (!) 155/119 (!) 117/102 (!) 130/92  Pulse: (!) 127 (!) 135 (!) 121 (!) 107  Resp: (!) 21 (!) 22 (!) 24 16  Temp:   (!) 96.9 F (36.1 C)   TempSrc:   Axillary   SpO2: 100% 99% 100% 100%  Weight:      Height:       Physical Exam  Constitutional:  Thin appearing woman tripoding on bed, breathing uncomfortably, in moderate respiratory distress.   Cardiovascular:  Tachycardic without murmurs or rubs. Radial pulses intact bilaterally.   Pulmonary/Chest:  Significant accessory muscle use. Inspiratory and expiratory wheezing appreciated in bilateral lung fields. No crackles appreciated.   Abdominal: Soft. She exhibits no distension. There is no tenderness. There is no guarding.  Musculoskeletal: She exhibits no edema (of bilateral lower extremities) or tenderness (of bilateral lower extremities).  Skin: Skin is warm and dry. No rash noted. No erythema.   Assessment/Plan:  Active Problems:   Acute respiratory failure (HCC)   Shortness of breath  Caitlin [name] is a 51 yo with a PMH of CHF (LVEF 40-45%, grade 2 DD on8/2018 echo) and COPD who presented with acute onset SOB that progressively worsened during the 72 hours leading up to presentation. On arrival the patient was given continuous albuterol, IV solumedrol, and magnesium. Patient was admitted to the internal medicine teaching service for management. The specific problems addressed during admission were as follows:  Acute respiratory failure 2/2 reactive airway disease: Patient's clinical presentation  most consistent with reactive airway disease that has been refractory to scheduled DuoNebs and oral steroids. Overnight increased WOB required BiPAP and patient was unable to tolerate BiPAP vacation today during interview. Patient s/p IV solumedrol, Mg, and Xopenex overnight, with little clinical improvement. Patient will likely require intubation as her work of breathing is significantly increased and patient unable to tolerate being off BiPAP. -Consult PCCM, recommendations appreciated -Continue DuoNebs q 4 hours -Continue albuterol nebulizer q4 hours PRN  CHF and hx of HTN: -Follow up echocardiography -Continue home lisinopril 20 mg and metoprolol 20 daily  Fluids/Diet: None/HH VTE prophylaxis: Lovenox Code Status: Full  Dispo: Anticipated discharge pending clinical improvement.   Thomasene Ripple, MD 09/10/2017, 10:00 AM Pager: [phone]

## 2017-09-10 NOTE — Care Management Note (Signed)
Case Management Note  Patient Details  Name: Caitlin [name] MRN: [id number] Date of Birth: 23-Dec-1965  Subjective/Objective:   Pt admitted with SOB and resp distress - failed BIPAP and now intubated             Action/Plan: PTA independent from home.  Pt is on ventilator and no family at bedside.  CM will continue to follow for discharge needs   Expected Discharge Date:                  Expected Discharge Plan:     In-House Referral:     Discharge planning Services  CM Consult  Post Acute Care Choice:    Choice offered to:     DME Arranged:    DME Agency:     HH Arranged:    HH Agency:     Status of Service:     If discussed at [employer], dates discussed:    Additional Comments:  Maryclare Labrador, RN 09/10/2017, 2:37 PM

## 2017-09-10 NOTE — Progress Notes (Signed)
Patient arrived to unit from [facility]. 2L oxygen; patient still in resp distress; sats 97%; tachy 130's

## 2017-09-10 NOTE — Progress Notes (Signed)
Report given to 2 M nurse, transported pt.

## 2017-09-11 ENCOUNTER — Inpatient Hospital Stay (HOSPITAL_COMMUNITY): Payer: Self-pay

## 2017-09-11 DIAGNOSIS — N179 Acute kidney failure, unspecified: Secondary | ICD-10-CM

## 2017-09-11 DIAGNOSIS — J9620 Acute and chronic respiratory failure, unspecified whether with hypoxia or hypercapnia: Secondary | ICD-10-CM

## 2017-09-11 DIAGNOSIS — Z4659 Encounter for fitting and adjustment of other gastrointestinal appliance and device: Secondary | ICD-10-CM

## 2017-09-11 DIAGNOSIS — Z789 Other specified health status: Secondary | ICD-10-CM

## 2017-09-11 LAB — GLUCOSE, CAPILLARY
GLUCOSE-CAPILLARY: 146 mg/dL — AB (ref 65–99)
GLUCOSE-CAPILLARY: 129 mg/dL — AB (ref 65–99)
GLUCOSE-CAPILLARY: 139 mg/dL — AB (ref 65–99)
GLUCOSE-CAPILLARY: 122 mg/dL — AB (ref 65–99)
Glucose-Capillary: 136 mg/dL — ABNORMAL HIGH (ref 65–99)

## 2017-09-11 LAB — BASIC METABOLIC PANEL
ANION GAP: 12 (ref 5–15)
Anion gap: 10 (ref 5–15)
BUN: 39 mg/dL — ABNORMAL HIGH (ref 6–20)
BUN: 42 mg/dL — ABNORMAL HIGH (ref 6–20)
CALCIUM: 8.9 mg/dL (ref 8.9–10.3)
CHLORIDE: 111 mmol/L (ref 101–111)
CO2: 21 mmol/L — ABNORMAL LOW (ref 22–32)
CO2: 21 mmol/L — AB (ref 22–32)
CREATININE: 1.3 mg/dL — AB (ref 0.44–1.00)
Calcium: 8.9 mg/dL (ref 8.9–10.3)
Chloride: 108 mmol/L (ref 101–111)
Creatinine, Ser: 1.35 mg/dL — ABNORMAL HIGH (ref 0.44–1.00)
GFR calc non Af Amer: 46 mL/min — ABNORMAL LOW (ref >60)
GFR, EST AFRICAN AMERICAN: 51 mL/min — AB (ref >60)
GFR, EST AFRICAN AMERICAN: 53 mL/min — AB (ref >60)
GFR, EST NON AFRICAN AMERICAN: 44 mL/min — AB (ref >60)
GLUCOSE: 124 mg/dL — AB (ref 65–99)
Glucose, Bld: 133 mg/dL — ABNORMAL HIGH (ref 65–99)
POTASSIUM: 4.1 mmol/L (ref 3.5–5.1)
Potassium: 4 mmol/L (ref 3.5–5.1)
SODIUM: 141 mmol/L (ref 135–145)
Sodium: 142 mmol/L (ref 135–145)

## 2017-09-11 LAB — CBC
HCT: 36.6 % (ref 36.0–46.0)
Hemoglobin: 11.9 g/dL — ABNORMAL LOW (ref 12.0–15.0)
MCH: 28.6 pg (ref 26.0–34.0)
MCHC: 32.5 g/dL (ref 30.0–36.0)
MCV: 88 fL (ref 78.0–100.0)
PLATELETS: 297 10*3/uL (ref 150–400)
RBC: 4.16 MIL/uL (ref 3.87–5.11)
RDW: 15 % (ref 11.5–15.5)
WBC: 11.5 10*3/uL — AB (ref 4.0–10.5)

## 2017-09-11 LAB — BRAIN NATRIURETIC PEPTIDE: B NATRIURETIC PEPTIDE 5: 42.2 pg/mL (ref 0.0–100.0)

## 2017-09-11 LAB — STREP PNEUMONIAE URINARY ANTIGEN: Strep Pneumo Urinary Antigen: NEGATIVE

## 2017-09-11 MED ORDER — DOXYCYCLINE HYCLATE 100 MG IV SOLR
100.0000 mg | Freq: Two times a day (BID) | INTRAVENOUS | Status: AC
  Administered 2017-09-11 – 2017-09-15 (×10): 100 mg via INTRAVENOUS
  Filled 2017-09-11 (×10): qty 100

## 2017-09-11 MED ORDER — VITAL HIGH PROTEIN PO LIQD
1000.0000 mL | ORAL | Status: DC
  Administered 2017-09-11 (×3)
  Administered 2017-09-11: 1000 mL
  Administered 2017-09-11: 12:00:00
  Administered 2017-09-11: 1000 mL
  Administered 2017-09-11 (×3)
  Administered 2017-09-12: 1000 mL
  Administered 2017-09-13 (×2)
  Administered 2017-09-13 – 2017-09-15 (×4): 1000 mL

## 2017-09-11 MED ORDER — SODIUM CHLORIDE 0.9 % IV BOLUS (SEPSIS)
500.0000 mL | Freq: Once | INTRAVENOUS | Status: AC
  Administered 2017-09-11: 500 mL via INTRAVENOUS

## 2017-09-11 MED ORDER — METOPROLOL TARTRATE 5 MG/5ML IV SOLN: 2.5000 mg | INTRAVENOUS | Status: DC | PRN

## 2017-09-11 NOTE — Plan of Care (Signed)
Problem: Nutrition: Goal: Adequate nutrition will be maintained Outcome: Progressing Patient was started on tube feed, Vital High Protein, today. Will continue to monitor for tolerance and nutritional status.

## 2017-09-11 NOTE — Progress Notes (Signed)
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  She also has a 30-pack-year history of smoking and continues to smoke.  She been living with her sister and making progress for rehabilitation standpoint.  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  She took her rescue bronchodilators without much relief because of this she went to the emergency room.  She was given bronchodilator therapy, and sent home with a prescription of prednisone.  Her symptoms did not get better, she returned to the emergency room on the 24th with resting shortness of breath, and almost continuous use of her rescue bronchodilator without relief.  Her initial chest x-ray was without infiltrate or evidence of edema, she had no sick exposures, no fever or chills.  She was admitted with a working diagnosis of acute exacerbation chronic obstructive pulmonary disease, and treated appropriately with supplemental oxygen, inhaled bronchodilators, and systemic steroids.  On the evening of 10/25 she developed acute distress, was found by nursing staff tripod in bed.  Was treated with IV Lasix, pulse IV steroid, and placed on noninvasive positive pressure ventilation.  Throughout the course of the evening and going into the a.m. hours of 10/26.  Her work of breathing has remained quite elevated.  She has been on and off BiPAP, continues to have cough, continues to wheeze, has not been getting much relief from rescue bronchodilation.  Critical care was asked to  see because of work of breathing, noninvasive positive pressure ventilation requirements, and concern about clinical deterioration.   EVENTS 09/08/2017 - admit 09/10/17 - intubated   SUBJECTIVE/OVERNIGHT/INTERVAL HX 1027/18 - remains on vent. RVP negative. Echo ef 55%. Intermittent bitiing with breakthrough agitation. Transient hypotension tachycarida improved with fluids   VITAL SIGNS: BP (!) 89/60   Pulse (!) 122   Temp 98 F (36.7 C) (Oral)   Resp 16   Ht 5\' 9"  (1.753 m)   Wt 56.2 kg (123 lb 14.4 oz)   LMP 09/09/2014 (Approximate)   SpO2 99%   BMI 18.30 kg/m   HEMODYNAMICS:    VENTILATOR SETTINGS: Vent Mode: PRVC FiO2 (%):  [30 %-100 %] 35 % Set Rate:  [15 bmp] 15 bmp Vt Set:  [530 mL] 530 mL PEEP:  [5 cmH20] 5 cmH20 Plateau Pressure:  [13 cmH20-20 cmH20] 20 cmH20  INTAKE / OUTPUT: I/O last 3 completed shifts: In: 1475.7 [I.V.:375.7; IV Piggyback:1100] Out: 409 [Urine:734]     EXAM  General Appearance:    Looks criticall ill   Head:    Normocephalic, without obvious abnormality, atraumatic  Eyes:    PERRL - yes, conjunctiva/corneas - clear      Ears:    Normal external ear canals, both ears  Nose:   NG tube - no  Throat:  ETT TUBE - yes , OG tube - yes  Neck:   Supple,  No enlargement/tenderness/nodules     Lungs:     Clear to auscultation bilaterally, Ventilator   Synchrony - yes  Chest wall:    No deformity  Heart:    S1 and S2  normal, no murmur, CVP - no.  Pressors - no  Abdomen:     Soft, no masses, no organomegaly  Genitalia:    Not done  Rectal:   not done  Extremities:   Extremities- intact     Skin:   Intact in exposed areas . Sacral area - no decub reported     Neurologic:   Sedation - diprivan and fent gtt -> RASS - -3       LABS  PULMONARY  Recent Labs Lab 09/10/17 1446  PHART 7.236*  PCO2ART 68.0*  PO2ART 518.0*  HCO3 28.9*  TCO2 31  O2SAT 100.0    CBC  Recent Labs Lab 09/08/17 1040 09/09/17 0233 09/11/17 0415   HGB 13.1 12.4 11.9*  HCT 39.3 38.1 36.6  WBC 9.7 13.3* 11.5*  PLT 325 331 297    COAGULATION No results for input(s): INR in the last 168 hours.  CARDIAC   Recent Labs Lab 09/07/17 0309  TROPONINI <0.03   No results for input(s): PROBNP in the last 168 hours.   CHEMISTRY  Recent Labs Lab 09/07/17 0309 09/08/17 1040 09/09/17 0233 09/10/17 0248 09/11/17 0415  NA 140 141 140 141 141  K 3.8 3.2* 4.1 3.8 4.1  CL 109 108 105 102 108  CO2 22 24 26 27  21*  GLUCOSE 96 109* 99 120* 133*  BUN 8 8 13 16  39*  CREATININE 0.54 0.57 0.62 0.80 1.35*  CALCIUM 9.3 9.5 9.6 10.0 8.9   Estimated Creatinine Clearance: 42.8 mL/min (A) (by C-G formula based on SCr of 1.35 mg/dL (H)).   LIVER  Recent Labs Lab 09/10/17 1420  AST 23  ALT 18  ALKPHOS 60  BILITOT 0.8  PROT 8.1  ALBUMIN 4.5     INFECTIOUS  Recent Labs Lab 09/10/17 1201  PROCALCITON <0.10     ENDOCRINE CBG (last 3)   Recent Labs  09/10/17 2336 09/11/17 0356 09/11/17 0743  GLUCAP 143* 146* 129*         IMAGING x48h  - image(s) personally visualized  -   highlighted in bold Portable Chest Xray  Result Date: 09/10/2017 CLINICAL DATA:  Respiratory distress EXAM: PORTABLE CHEST 1 VIEW COMPARISON:  09/08/2017 FINDINGS: Incomplete inclusion of the right thorax. Interval intubation, tip of the endotracheal tube is about 3.8 cm superior to carina. Esophageal tube tip is in the left upper quadrant, side-port near the GE junction. Hyperinflation remains. No consolidation. No pleural effusion or pneumothorax. Stable cardiomediastinal silhouette with atherosclerosis. IMPRESSION: 1. Endotracheal tube tip about 3.8 cm superior to carina 2. Esophageal tube side port near GE junction, further advancement could be considered for more optimal positioning 3. Hyperinflation with emphysematous changes. No acute pulmonary infiltrate. Electronically Signed   By: Donavan Foil M.D.   On: 09/10/2017 14:21   Dg Abd  Portable 1v  Result Date: 09/10/2017 CLINICAL DATA:  Nasogastric tube adjustment. EXAM: PORTABLE ABDOMEN - 1 VIEW COMPARISON:  Earlier film, same date. FINDINGS: The NG tube tip is in the fundal region of the stomach. The proximal port is near the GE junction. Persistent diffuse ileus bowel gas pattern. IMPRESSION: NG tube tip is in the fundal region the stomach with the proximal port at the GE junction. Electronically Signed   By: Marijo Sanes M.D.   On: 09/10/2017 15:45   Dg Abd Portable 1v  Result Date: 09/10/2017 CLINICAL DATA:  Feeding tube EXAM: PORTABLE ABDOMEN - 1 VIEW COMPARISON:  None. FINDINGS: Esophageal tube tip overlies the proximal stomach,  side-port just beyond GE junction. Mild diffuse increased bowel gas without definitive signs for obstruction. Mild stool in the colon. IMPRESSION: Esophageal tube tip is in the left upper quadrant, side-port near or slightly beyond GE junction, slight advancement could be considered for more optimal positioning. Nonobstructed gas pattern. Electronically Signed   By: Donavan Foil M.D.   On: 09/10/2017 14:22       ASSESSMENT and PLAN  COPD with acute exacerbation ([place]) Not ready to wean off vent. RVP negative. PCT negative  Plan BD  Steroids Add IV doxy  Acute on chronic respiratory failure ([place]) Not on home o2  Plan Full vent support  Acute combined systolic and diastolic heart failure (HCC) EF this admit 55% Appears euvolemic Trop[ normal Mild tachycardia +  Plan Monitor Lopressor prn   Recent Labs Lab 09/07/17 0309  TROPONINI <0.03     AKI (acute kidney injury) ([place])  Recent Labs Lab 09/07/17 0309 09/08/17 1040 09/09/17 0233 09/10/17 0248 09/11/17 0415  CREATININE 0.54 0.57 0.62 0.80 1.35*     New AKI 09/11/2017  Plan Fluid bolus and reassess; bmet at [facility] for care related to feeding tube nutrn consult for TF  No contraindication to deep vein thrombosis (DVT) prophylaxis Lovenox  q24h      FAMILY  - Updates: 09/11/2017 --> sons updated at bedside  - Inter-disciplinary family meet or Palliative Care meeting due by:  DAy 7. Current LOS is LOS 2 days  CODE STATUS    Code Status Orders        Start     Ordered   09/08/17 1427  Full code  Continuous     09/08/17 1426    Code Status History    Date Active Date Inactive Code Status Order ID Comments User Context   07/15/2017  9:09 AM 07/19/2017  3:06 PM Full Code [id number]  Zada Finders, MD ED        DISPO Keep in ICU       The patient is critically ill with multiple organ systems failure and requires high complexity decision making for assessment and support, frequent evaluation and titration of therapies, application of advanced monitoring technologies and extensive interpretation of multiple databases.   Critical Care Time devoted to patient care services described in this note is  30  Minutes. This time reflects time of care of this signee Dr Brand Males. This critical care time does not reflect procedure time, or teaching time or supervisory time of PA/NP/Med student/Med Resident etc but could involve care discussion time    Dr. Brand Males, M.D., [facility].C.P Pulmonary and Critical Care Medicine Staff Physician [place] [facility] Pager: [phone], If no answer or between  15:00h - 7:00h: call [phone]  09/11/2017 9:00 AM

## 2017-09-11 NOTE — Assessment & Plan Note (Addendum)
Not ready to wean off vent. RVP negative. PCT negative Sam 09/12/2017    Plan BD  Steroids IV doxy

## 2017-09-11 NOTE — Progress Notes (Signed)
Initial Nutrition Assessment  DOCUMENTATION CODES:   Underweight  INTERVENTION:    Vital High Protein at 40 ml/h (960 ml per day)  Provides 960 kcal (1406 total kcal with propofol), 84 gm protein, 803 ml free water daily  NUTRITION DIAGNOSIS:   Inadequate oral intake related to inability to eat as evidenced by NPO status.  GOAL:   Patient will meet greater than or equal to 90% of their needs  MONITOR:   Vent status, TF tolerance, Labs, I & O's  REASON FOR ASSESSMENT:   Consult Enteral/tube feeding initiation and management  ASSESSMENT:   51 yo female with PMH of COPD, CHF, NSTEMI who was admitted on 10/24 with COPD exacerbation; required intubation on 10/26.  Received MD Consult for TF initiation and management. Patient is currently intubated on ventilator support MV: 7.2 L/min Temp (24hrs), Avg:98.6 F (37 C), Min:97.6 F (36.4 C), Max:99.2 F (37.3 C)  Propofol: 16.9 ml/hr providing 446 kcal from lipid.  Labs reviewed.  CBG's: 146-129 Medications reviewed and include solumedrol and propofol. I/O + 1.1 L since admission  NUTRITION - FOCUSED PHYSICAL EXAM:    Most Recent Value  Orbital Region  No depletion  Upper Arm Region  No depletion  Thoracic and Lumbar Region  No depletion  Buccal Region  Unable to assess  Temple Region  Mild depletion  Clavicle Bone Region  Moderate depletion  Clavicle and Acromion Bone Region  Moderate depletion  Scapular Bone Region  Unable to assess  Dorsal Hand  Unable to assess  Patellar Region  No depletion  Anterior Thigh Region  No depletion  Posterior Calf Region  No depletion  Edema (RD Assessment)  None  Hair  Reviewed  Eyes  Unable to assess  Mouth  Unable to assess  Skin  Reviewed  Nails  Unable to assess       Diet Order:  Diet NPO time specified  EDUCATION NEEDS:   No education needs have been identified at this time  Skin:  Skin Assessment: Reviewed RN Assessment  Last BM:  10/25  Height:    Ht Readings from Last 1 Encounters:  09/10/17 5\' 9"  (1.753 m)    Weight:   Wt Readings from Last 1 Encounters:  09/10/17 123 lb 14.4 oz (56.2 kg)    Ideal Body Weight:  65.9 kg  BMI:  Body mass index is 18.3 kg/m.  Estimated Nutritional Needs:   Kcal:  1426  Protein:  85-95 gm  Fluid:  1.5 L    Molli Barrows, RD, LDN, [place] Pager [phone] After Hours Pager [phone]

## 2017-09-11 NOTE — Assessment & Plan Note (Signed)
Lovenox q24h

## 2017-09-11 NOTE — Assessment & Plan Note (Addendum)
Ongoing TF since 09/11/17. Tolerating well

## 2017-09-11 NOTE — Assessment & Plan Note (Addendum)
  Recent Labs Lab 09/09/17 0233 09/10/17 0248 09/11/17 0415 09/11/17 0958 09/12/17 0210  CREATININE 0.62 0.80 1.35* 1.30* 0.97     New AKI 09/11/2017. Resolved 09/12/2017   Plan Monitor

## 2017-09-11 NOTE — Assessment & Plan Note (Addendum)
EF this admit 55% Appears euvolemic Trop[ normal Mild tachycardia +  Plan Monitor Lopressor prn checl trop

## 2017-09-11 NOTE — Assessment & Plan Note (Signed)
Not on home o2  Plan Full vent support

## 2017-09-12 ENCOUNTER — Inpatient Hospital Stay (HOSPITAL_COMMUNITY): Payer: Self-pay

## 2017-09-12 DIAGNOSIS — Z9189 Other specified personal risk factors, not elsewhere classified: Secondary | ICD-10-CM

## 2017-09-12 LAB — BASIC METABOLIC PANEL
Anion gap: 8 (ref 5–15)
BUN: 45 mg/dL — AB (ref 6–20)
CHLORIDE: 110 mmol/L (ref 101–111)
CO2: 21 mmol/L — AB (ref 22–32)
CREATININE: 0.97 mg/dL (ref 0.44–1.00)
Calcium: 8.7 mg/dL — ABNORMAL LOW (ref 8.9–10.3)
GFR calc Af Amer: >60 mL/min (ref >60)
GFR calc non Af Amer: >60 mL/min (ref >60)
Glucose, Bld: 125 mg/dL — ABNORMAL HIGH (ref 65–99)
POTASSIUM: 4.5 mmol/L (ref 3.5–5.1)
Sodium: 139 mmol/L (ref 135–145)

## 2017-09-12 LAB — GLUCOSE, CAPILLARY
GLUCOSE-CAPILLARY: 124 mg/dL — AB (ref 65–99)
GLUCOSE-CAPILLARY: 127 mg/dL — AB (ref 65–99)
GLUCOSE-CAPILLARY: 131 mg/dL — AB (ref 65–99)
GLUCOSE-CAPILLARY: 144 mg/dL — AB (ref 65–99)
GLUCOSE-CAPILLARY: 155 mg/dL — AB (ref 65–99)
Glucose-Capillary: 128 mg/dL — ABNORMAL HIGH (ref 65–99)
Glucose-Capillary: 152 mg/dL — ABNORMAL HIGH (ref 65–99)

## 2017-09-12 LAB — LEGIONELLA PNEUMOPHILA SEROGP 1 UR AG: L. pneumophila Serogp 1 Ur Ag: NEGATIVE

## 2017-09-12 LAB — PHOSPHORUS: Phosphorus: 3.8 mg/dL (ref 2.5–4.6)

## 2017-09-12 LAB — BRAIN NATRIURETIC PEPTIDE: B Natriuretic Peptide: 50.2 pg/mL (ref 0.0–100.0)

## 2017-09-12 LAB — MAGNESIUM: Magnesium: 2.7 mg/dL — ABNORMAL HIGH (ref 1.7–2.4)

## 2017-09-12 MED ORDER — MIDAZOLAM HCL 2 MG/2ML IJ SOLN
INTRAMUSCULAR | Status: AC
  Administered 2017-09-12: 1 mg via INTRAVENOUS
  Filled 2017-09-12: qty 2

## 2017-09-12 MED ORDER — MIDAZOLAM HCL 2 MG/2ML IJ SOLN
1.0000 mg | INTRAMUSCULAR | Status: DC | PRN
Start: 2017-09-12 — End: 2017-09-13
  Administered 2017-09-12 – 2017-09-13 (×7): 1 mg via INTRAVENOUS
  Filled 2017-09-12 (×6): qty 2

## 2017-09-12 MED ORDER — FENTANYL CITRATE (PF) 2500 MCG/50ML IJ SOLN
0.0000 ug/h | Status: DC
  Administered 2017-09-12 – 2017-09-14 (×5): 400 ug/h via INTRAVENOUS
  Administered 2017-09-14 – 2017-09-15 (×3): 100 ug/h via INTRAVENOUS
  Administered 2017-09-15 (×2): 400 ug/h via INTRAVENOUS
  Administered 2017-09-15: 100 ug/h via INTRAVENOUS
  Administered 2017-09-16 – 2017-09-17 (×2): 400 ug/h via INTRAVENOUS
  Filled 2017-09-12 (×11): qty 100

## 2017-09-12 MED ORDER — GUAIFENESIN 100 MG/5ML PO SOLN
10.0000 mL | Freq: Three times a day (TID) | ORAL | Status: DC
  Administered 2017-09-12 – 2017-09-27 (×47): 200 mg
  Filled 2017-09-12 (×2): qty 10
  Filled 2017-09-12: qty 5
  Filled 2017-09-12: qty 10
  Filled 2017-09-12: qty 5
  Filled 2017-09-12 (×18): qty 10
  Filled 2017-09-12: qty 5
  Filled 2017-09-12 (×3): qty 10
  Filled 2017-09-12: qty 5
  Filled 2017-09-12 (×22): qty 10

## 2017-09-12 MED ORDER — CLONAZEPAM 0.5 MG PO TBDP
0.5000 mg | ORAL_TABLET | Freq: Three times a day (TID) | ORAL | Status: DC
  Administered 2017-09-12 – 2017-09-13 (×5): 0.5 mg via ORAL
  Filled 2017-09-12 (×5): qty 1

## 2017-09-12 NOTE — Progress Notes (Signed)
[facility] Progress Note Patient Name: Caitlin [name] DOB: [birth year] MRN: [id number]   Date of Service  09/12/2017  HPI/Events of Note  Request to renew order for bilateral soft wrist restraints.   eICU Interventions  Will renew soft bilateral wrist restraint order.      Intervention Category Major Interventions: Other:  Sommer,Steven Cornelia Copa 09/12/2017, 9:00 PM

## 2017-09-12 NOTE — Progress Notes (Signed)
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  She also has a 30-pack-year history of smoking and continues to smoke.  She been living with her sister and making progress for rehabilitation standpoint.  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  She took her rescue bronchodilators without much relief because of this she went to the emergency room.  She was given bronchodilator therapy, and sent home with a prescription of prednisone.  Her symptoms did not get better, she returned to the emergency room on the 24th with resting shortness of breath, and almost continuous use of her rescue bronchodilator without relief.  Her initial chest x-ray was without infiltrate or evidence of edema, she had no sick exposures, no fever or chills.  She was admitted with a working diagnosis of acute exacerbation chronic obstructive pulmonary disease, and treated appropriately with supplemental oxygen, inhaled bronchodilators, and systemic steroids.  On the evening of 10/25 she developed acute distress, was found by nursing staff tripod in bed.  Was treated with IV Lasix, pulse IV steroid, and placed on noninvasive positive pressure ventilation.  Throughout the course of the evening and going into the a.m. hours of 10/26.  Her work of breathing has remained quite elevated.  She has been on and off BiPAP, continues to have cough, continues to wheeze, has not been getting much relief from rescue bronchodilation.  Critical care was asked to  see because of work of breathing, noninvasive positive pressure ventilation requirements, and concern about clinical deterioration.   EVENTS 09/08/2017 - admit 09/10/17 - intubated 1027/18 - remains on vent. RVP negative. Echo ef 55%. Intermittent bitiing with breakthrough agitation. Transient hypotension tachycarida improved with fluids    SUBJECTIVE/OVERNIGHT/INTERVAL HX 10/28 - Oon TF. On fent 400 and diprivan 50. Unable to do  WUA due to air hunger and agitation on any attempt  VITAL SIGNS: BP 110/80   Pulse (!) 133   Temp 99.1 F (37.3 C) (Oral)   Resp 19   Ht 5\' 9"  (1.753 m)   Wt 60.1 kg (132 lb 7.9 oz)   LMP 09/09/2014 (Approximate)   SpO2 100%   BMI 19.57 kg/m   HEMODYNAMICS:    VENTILATOR SETTINGS: Vent Mode: PRVC FiO2 (%):  [30 %] 30 % Set Rate:  [12 bmp] 12 bmp Vt Set:  [530 mL] 530 mL PEEP:  [5 cmH20] 5 cmH20 Plateau Pressure:  [12 cmH20-18 cmH20] 18 cmH20  INTAKE / OUTPUT: I/O last 3 completed shifts: In: 2184.2 [I.V.:1094.2; NG/GT:840; IV Piggyback:250] Out: 6433 [Urine:1334]     EXAM   General Appearance:    Looks criticall ill   Head:    Normocephalic, without obvious abnormality, atraumatic  Eyes:    PERRL - yes, conjunctiva/corneas - clear      Ears:    Normal external ear canals, both ears  Nose:   NG tube - no  Throat:  ETT TUBE - yes , OG tube - yes  Neck:   Supple,  No enlargement/tenderness/nodules     Lungs:     Clear to  auscultation bilaterally, Ventilator   Synchrony - yes  Chest wall:    No deformity  Heart:    S1 and S2 normal, no murmur, CVP - no.  Pressors - no  Abdomen:     Soft, no masses, no organomegaly  Genitalia:    Not done  Rectal:   not done  Extremities:   Extremities- intact     Skin:   Intact in exposed areas . Sacral area - no sacaral decub per rn     Neurologic:   Sedation - fent gtt and diprivan gtt -> RASS - -3 . Moves all 4s - when agitated. CAM-ICU - x . Orientation - x         LABS  PULMONARY  Recent Labs Lab 09/10/17 1446  PHART 7.236*  PCO2ART 68.0*  PO2ART 518.0*  HCO3 28.9*  TCO2 31  O2SAT 100.0    CBC  Recent Labs Lab 09/08/17 1040 09/09/17 0233 09/11/17 0415  HGB 13.1 12.4 11.9*  HCT 39.3 38.1 36.6  WBC 9.7 13.3* 11.5*  PLT 325 331 297    COAGULATION No results for input(s): INR in the last 168 hours.  CARDIAC    Recent Labs Lab 09/07/17 0309  TROPONINI <0.03   No results for input(s): PROBNP in the last 168 hours.   CHEMISTRY  Recent Labs Lab 09/09/17 0233 09/10/17 0248 09/11/17 0415 09/11/17 0958 09/12/17 0210  NA 140 141 141 142 139  K 4.1 3.8 4.1 4.0 4.5  CL 105 102 108 111 110  CO2 26 27 21* 21* 21*  GLUCOSE 99 120* 133* 124* 125*  BUN 13 16 39* 42* 45*  CREATININE 0.62 0.80 1.35* 1.30* 0.97  CALCIUM 9.6 10.0 8.9 8.9 8.7*  MG  --   --   --   --  2.7*  PHOS  --   --   --   --  3.8   Estimated Creatinine Clearance: 63.6 mL/min (by C-G formula based on SCr of 0.97 mg/dL).   LIVER  Recent Labs Lab 09/10/17 1420  AST 23  ALT 18  ALKPHOS 60  BILITOT 0.8  PROT 8.1  ALBUMIN 4.5     INFECTIOUS  Recent Labs Lab 09/10/17 1201  PROCALCITON <0.10     ENDOCRINE CBG (last 3)   Recent Labs  09/12/17 0017 09/12/17 0402 09/12/17 0758  GLUCAP 155* 144* 124*         IMAGING x48h  - image(s) personally visualized  -   highlighted in bold Dg Chest Port 1 View  Result Date: 09/12/2017 CLINICAL DATA:  Endotracheal tube. EXAM: PORTABLE CHEST 1 VIEW COMPARISON:  09/11/2017 FINDINGS: Endotracheal tube is unchanged. Enteric tube courses into the left upper abdomen with tip not imaged. The cardiomediastinal silhouette is within normal limits. The lungs remain hyperinflated without evidence of airspace consolidation, edema, sizable pleural effusion, or pneumothorax. IMPRESSION: COPD without evidence of active disease. Electronically Signed   By: Logan Bores M.D.   On: 09/12/2017 07:36    Portable Chest Xray  Result Date: 09/11/2017 CLINICAL DATA:  Intubation. EXAM: PORTABLE CHEST 1 VIEW COMPARISON:  Chest x-ray from yesterday. FINDINGS: The endotracheal tube is unchanged in position. Enteric tube seen entering the stomach, with the tip beyond the field of view. The cardiomediastinal silhouette is normal in size. Normal pulmonary vascularity. The lungs remain hyperinflated with emphysematous changes in the upper lobes. No focal consolidation, pleural effusion, or pneumothorax. No acute osseous abnormality. IMPRESSION: COPD.  No active disease. Electronically Signed  By: Titus Dubin M.D.   On: 09/11/2017 09:06   Portable Chest Xray  Result Date: 09/10/2017 CLINICAL DATA:  Respiratory distress EXAM: PORTABLE CHEST 1 VIEW COMPARISON:  09/08/2017 FINDINGS: Incomplete inclusion of the right thorax. Interval intubation, tip of the endotracheal tube is about 3.8 cm superior to carina. Esophageal tube tip is in the left upper quadrant, side-port near the GE junction. Hyperinflation remains. No consolidation. No pleural effusion or pneumothorax. Stable cardiomediastinal silhouette with atherosclerosis. IMPRESSION: 1. Endotracheal tube tip about 3.8 cm superior to carina 2. Esophageal tube side port near GE junction, further advancement could be considered for more optimal positioning 3. Hyperinflation with emphysematous changes. No acute pulmonary infiltrate. Electronically Signed   By: Donavan Foil M.D.   On: 09/10/2017 14:21   Dg Abd Portable 1v  Result Date: 09/10/2017 CLINICAL DATA:  Nasogastric tube adjustment. EXAM: PORTABLE ABDOMEN - 1 VIEW COMPARISON:  Earlier film, same date. FINDINGS: The NG tube tip is in the fundal region of the stomach. The proximal port is near the GE junction. Persistent diffuse ileus bowel gas pattern. IMPRESSION: NG tube tip is in the fundal region the stomach with the proximal port at the GE junction. Electronically Signed   By: Marijo Sanes M.D.    On: 09/10/2017 15:45   Dg Abd Portable 1v  Result Date: 09/10/2017 CLINICAL DATA:  Feeding tube EXAM: PORTABLE ABDOMEN - 1 VIEW COMPARISON:  None. FINDINGS: Esophageal tube tip overlies the proximal stomach, side-port just beyond GE junction. Mild diffuse increased bowel gas without definitive signs for obstruction. Mild stool in the colon. IMPRESSION: Esophageal tube tip is in the left upper quadrant, side-port near or slightly beyond GE junction, slight advancement could be considered for more optimal positioning. Nonobstructed gas pattern. Electronically Signed   By: Donavan Foil M.D.   On: 09/10/2017 14:22       ASSESSMENT and PLAN  COPD with acute exacerbation ([place]) Not ready to wean off vent. RVP negative. PCT negative Sam 09/12/2017    Plan BD  Steroids IV doxy  Acute on chronic respiratory failure (HCC) Not on home o2  Plan Full vent support  Acute combined systolic and diastolic heart failure (HCC) EF this admit 55% Appears euvolemic Trop[ normal Mild tachycardia +  Plan Monitor Lopressor prn checl trop    AKI (acute kidney injury) ([place])  Recent Labs Lab 09/09/17 0233 09/10/17 0248 09/11/17 0415 09/11/17 0958 09/12/17 0210  CREATININE 0.62 0.80 1.35* 1.30* 0.97     New AKI 09/11/2017. Resolved 09/12/2017   Plan Monitor  Encounter for care related to feeding tube Ongoing TF since 09/11/17. Tolerating well  No contraindication to deep vein thrombosis (DVT) prophylaxis Lovenox q24h  At risk for stress ulcer Continue SUP      FAMILY  - Updates: 09/12/2017 --> son updated at bedside. Explained can be unpredictable about her chances coming off vent and advised we might need few to several days to sort this out  - Inter-disciplinary family meet or Palliative Care meeting due by:  DAy 7. Current LOS is LOS 3 days  CODE STATUS    Code Status Orders        Start     Ordered   09/08/17 1427  Full code  Continuous     09/08/17 1426     Code Status History    Date Active Date Inactive Code Status Order ID Comments User Context   07/15/2017  9:09 AM 07/19/2017  3:06 PM Full Code [id number]  Zada Finders, MD ED        DISPO Keep in ICU      The patient is critically ill with multiple organ systems failure and requires high complexity decision making for assessment and support, frequent evaluation and titration of therapies, application of advanced monitoring technologies and extensive interpretation of multiple databases.   Critical Care Time devoted to patient care services described in this note is  30  Minutes. This time reflects time of care of this signee Dr Brand Males. This critical care time does not reflect procedure time, or teaching time or supervisory time of PA/NP/Med student/Med Resident etc but could involve care discussion time    Dr. Brand Males, M.D., [facility].C.P Pulmonary and Critical Care Medicine Staff Physician [place] [facility] Pager: [phone], If no answer or between  15:00h - 7:00h: call [phone]  09/12/2017 9:11 AM

## 2017-09-12 NOTE — Assessment & Plan Note (Signed)
Continue SUP

## 2017-09-12 NOTE — Progress Notes (Signed)
[facility] Progress Note Patient Name: Caitlin [name] DOB: 01-13-1966 MRN: [id number]   Date of Service  09/12/2017  HPI/Events of Note  Severe Agitation - Already at ceiling on Fentanyl and Propofol IV infusions.   eICU Interventions  Will order: 1. Versed 1 mg IV Q 2 hours PRN agitation or sedation.      Intervention Category Major Interventions: Delirium, psychosis, severe agitation - evaluation and management  Sommer,Steven [place] 09/12/2017, 5:58 AM

## 2017-09-12 NOTE — Progress Notes (Signed)
[place] Progress Note Patient Name: Caitlin [name] DOB: 1966/04/11 MRN: [id number]   Date of Service  09/12/2017  HPI/Events of Note  Agitation - Already on a Fentanyl IV infusion at 300 mcg/hour and Propofol IV infusion at 50 mcg/kg/min.   eICU Interventions  Will increase ceiling on Fentanyl IV infusion to 400 mcg/hour.      Intervention Category Minor Interventions: Agitation / anxiety - evaluation and management  Sommer,Steven Eugene 09/12/2017, 5:33 AM

## 2017-09-12 NOTE — Progress Notes (Signed)
[place] Progress Note Patient Name: Caitlin [name] DOB: [birth year] MRN: [id number]   Date of Service  09/12/2017  HPI/Events of Note  Agitation - Nursing request for bilateral soft wrist restraints.   eICU Interventions  Will order soft bilateral wrist restraints.     Intervention Category Minor Interventions: Agitation / anxiety - evaluation and management  Sommer,Steven Eugene 09/12/2017, 5:50 AM

## 2017-09-12 NOTE — Progress Notes (Signed)
Fentanyl gtt waste of 150 ml and versed gtt waste of 30 ml with [name].

## 2017-09-13 ENCOUNTER — Other Ambulatory Visit: Payer: Self-pay

## 2017-09-13 ENCOUNTER — Inpatient Hospital Stay (HOSPITAL_COMMUNITY): Payer: Self-pay

## 2017-09-13 DIAGNOSIS — J9601 Acute respiratory failure with hypoxia: Secondary | ICD-10-CM

## 2017-09-13 DIAGNOSIS — Z7189 Other specified counseling: Secondary | ICD-10-CM

## 2017-09-13 DIAGNOSIS — G9340 Encephalopathy, unspecified: Secondary | ICD-10-CM

## 2017-09-13 LAB — CBC WITH DIFFERENTIAL/PLATELET
BASOS ABS: 0 10*3/uL (ref 0.0–0.1)
BASOS PCT: 0 %
Eosinophils Absolute: 0 10*3/uL (ref 0.0–0.7)
Eosinophils Relative: 0 %
HCT: 33.6 % — ABNORMAL LOW (ref 36.0–46.0)
HEMOGLOBIN: 10.6 g/dL — AB (ref 12.0–15.0)
LYMPHS ABS: 1.3 10*3/uL (ref 0.7–4.0)
Lymphocytes Relative: 10 %
MCH: 28.3 pg (ref 26.0–34.0)
MCHC: 31.5 g/dL (ref 30.0–36.0)
MCV: 89.6 fL (ref 78.0–100.0)
Monocytes Absolute: 1.4 10*3/uL — ABNORMAL HIGH (ref 0.1–1.0)
Monocytes Relative: 11 %
NEUTROS ABS: 10.2 10*3/uL — AB (ref 1.7–7.7)
Neutrophils Relative %: 79 %
Platelets: 197 10*3/uL (ref 150–400)
RBC: 3.75 MIL/uL — ABNORMAL LOW (ref 3.87–5.11)
RDW: 15.9 % — AB (ref 11.5–15.5)
WBC: 12.9 10*3/uL — ABNORMAL HIGH (ref 4.0–10.5)

## 2017-09-13 LAB — POCT I-STAT 3, ART BLOOD GAS (G3+)
BICARBONATE: 25.9 mmol/L (ref 20.0–28.0)
O2 SAT: 88 %
PCO2 ART: 47.1 mmHg (ref 32.0–48.0)
PH ART: 7.348 — AB (ref 7.350–7.450)
PO2 ART: 59 mmHg — AB (ref 83.0–108.0)
TCO2: 27 mmol/L (ref 22–32)

## 2017-09-13 LAB — GLUCOSE, CAPILLARY
GLUCOSE-CAPILLARY: 130 mg/dL — AB (ref 65–99)
GLUCOSE-CAPILLARY: 143 mg/dL — AB (ref 65–99)
GLUCOSE-CAPILLARY: 114 mg/dL — AB (ref 65–99)
Glucose-Capillary: 150 mg/dL — ABNORMAL HIGH (ref 65–99)
Glucose-Capillary: 148 mg/dL — ABNORMAL HIGH (ref 65–99)

## 2017-09-13 LAB — BASIC METABOLIC PANEL
Anion gap: 7 (ref 5–15)
BUN: 33 mg/dL — ABNORMAL HIGH (ref 6–20)
CALCIUM: 8.7 mg/dL — AB (ref 8.9–10.3)
CO2: 24 mmol/L (ref 22–32)
CREATININE: 0.67 mg/dL (ref 0.44–1.00)
Chloride: 107 mmol/L (ref 101–111)
Glucose, Bld: 129 mg/dL — ABNORMAL HIGH (ref 65–99)
POTASSIUM: 4.6 mmol/L (ref 3.5–5.1)
Sodium: 138 mmol/L (ref 135–145)

## 2017-09-13 LAB — TROPONIN I

## 2017-09-13 LAB — TRIGLYCERIDES: TRIGLYCERIDES: 156 mg/dL — AB (ref <150)

## 2017-09-13 LAB — MAGNESIUM: MAGNESIUM: 2.5 mg/dL — AB (ref 1.7–2.4)

## 2017-09-13 LAB — PHOSPHORUS: PHOSPHORUS: 3.3 mg/dL (ref 2.5–4.6)

## 2017-09-13 LAB — BRAIN NATRIURETIC PEPTIDE: B NATRIURETIC PEPTIDE 5: 50.8 pg/mL (ref 0.0–100.0)

## 2017-09-13 MED ORDER — MIDAZOLAM HCL 2 MG/2ML IJ SOLN
1.0000 mg | INTRAMUSCULAR | Status: DC | PRN
  Administered 2017-09-13 – 2017-09-16 (×10): 1 mg via INTRAVENOUS
  Administered 2017-09-17: 2 mg via INTRAVENOUS
  Administered 2017-09-18 – 2017-09-20 (×5): 1 mg via INTRAVENOUS
  Filled 2017-09-13 (×2): qty 2

## 2017-09-13 MED ORDER — PANTOPRAZOLE SODIUM 40 MG PO PACK
40.0000 mg | PACK | Freq: Every day | ORAL | Status: DC
  Administered 2017-09-14 – 2017-09-27 (×13): 40 mg
  Filled 2017-09-13 (×16): qty 20

## 2017-09-13 MED ORDER — METHYLPREDNISOLONE SODIUM SUCC 40 MG IJ SOLR
40.0000 mg | Freq: Two times a day (BID) | INTRAMUSCULAR | Status: DC
  Administered 2017-09-13 – 2017-09-20 (×14): 40 mg via INTRAVENOUS
  Filled 2017-09-13 (×14): qty 1

## 2017-09-13 MED ORDER — SODIUM CHLORIDE 0.9 % IV SOLN
0.0000 ug/kg/h | INTRAVENOUS | Status: DC
  Administered 2017-09-13: 1 ug/kg/h via INTRAVENOUS
  Administered 2017-09-13: 0.4 ug/kg/h via INTRAVENOUS
  Filled 2017-09-13 (×3): qty 2

## 2017-09-13 MED ORDER — DEXMEDETOMIDINE HCL IN NACL 400 MCG/100ML IV SOLN: 0.0000 ug/kg/h | INTRAVENOUS | Status: DC

## 2017-09-13 MED ORDER — IPRATROPIUM-ALBUTEROL 0.5-2.5 (3) MG/3ML IN SOLN
3.0000 mL | Freq: Four times a day (QID) | RESPIRATORY_TRACT | Status: DC
  Administered 2017-09-13 – 2017-09-24 (×44): 3 mL via RESPIRATORY_TRACT
  Filled 2017-09-13 (×42): qty 3

## 2017-09-13 MED ORDER — BUDESONIDE 0.5 MG/2ML IN SUSP
0.5000 mg | Freq: Two times a day (BID) | RESPIRATORY_TRACT | Status: DC
  Administered 2017-09-13 – 2017-09-28 (×30): 0.5 mg via RESPIRATORY_TRACT
  Filled 2017-09-13 (×31): qty 2

## 2017-09-13 MED ORDER — ARFORMOTEROL TARTRATE 15 MCG/2ML IN NEBU
15.0000 ug | INHALATION_SOLUTION | Freq: Two times a day (BID) | RESPIRATORY_TRACT | Status: DC
  Administered 2017-09-13 – 2017-09-28 (×30): 15 ug via RESPIRATORY_TRACT
  Filled 2017-09-13 (×32): qty 2

## 2017-09-13 NOTE — Progress Notes (Signed)
[place] Progress Note Patient Name: Caitlin [name] DOB: 09-20-1966 MRN: [id number]   Date of Service  09/13/2017  HPI/Events of Note  Bradycardia - HR went down to 31 transiently on both Precedex and Propofol IV infusions.   eICU Interventions  Will order: 1. D/C Propofol and Precedex IV infusions.  2. Resume sedation with Fentanyl and Versed IV infusions.      Intervention Category Major Interventions: Arrhythmia - evaluation and management  Virgilene Stryker Eugene 09/13/2017, 10:03 PM

## 2017-09-13 NOTE — Progress Notes (Signed)
Wasted 50 ml bottle of Precedex, dropped and shattered on floor.  Witnessed by [employer], RN

## 2017-09-13 NOTE — Progress Notes (Signed)
[place] Progress Note Patient Name: Caitlin [name] DOB: [birth year] MRN: [id number]   Date of Service  09/13/2017  HPI/Events of Note  Agitation - Request to renew bilateral soft wrist restraints.   eICU Interventions  Will renew bilateral soft wrist restraints.      Intervention Category Minor Interventions: Agitation / anxiety - evaluation and management  Jovana Rembold Eugene 09/13/2017, 8:56 PM

## 2017-09-13 NOTE — Progress Notes (Signed)
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  She also has a 30-pack-year history of smoking and continues to smoke.  She been living with her sister and making progress for rehabilitation standpoint.  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  She took her rescue bronchodilators without much relief because of this she went to the emergency room.  She was given bronchodilator therapy, and sent home with a prescription of prednisone.  Her symptoms did not get better, she returned to the emergency room on the 24th with resting shortness of breath, and almost continuous use of her rescue bronchodilator without relief.  Her initial chest x-ray was without infiltrate or evidence of edema, she had no sick exposures, no fever or chills.  She was admitted with a working diagnosis of acute exacerbation chronic obstructive pulmonary disease, and treated appropriately with supplemental oxygen, inhaled bronchodilators, and systemic steroids.  On the evening of 10/25 she developed acute distress, was found by nursing staff tripod in bed.  Was treated with IV Lasix, pulse IV steroid, and placed on noninvasive positive pressure ventilation.  Throughout the course of the evening and going into the a.m. hours of 10/26.  Her work of breathing has remained quite elevated.  She has been on and off BiPAP, continues to have cough, continues to wheeze, has not been getting much relief from rescue bronchodilation.  Critical care was asked to  see because of work of breathing, noninvasive positive pressure ventilation requirements, and concern about clinical deterioration.   EVENTS 09/08/2017 - admit 09/10/17 - intubated  SUBJECTIVE/OVERNIGHT/INTERVAL HX 10/28 - Unable to wean due to agitation and persistent bronchospasm.  VITAL SIGNS: BP (!) 135/100 (BP Location: Right Arm)   Pulse (!) 114   Temp 98.6 F (37 C) (Axillary)   Resp 20   Ht 5\' 9"  (1.753 m)   Wt 63.1 kg (139 lb 1.8 oz)   LMP 09/09/2014 (Approximate)   SpO2 100%   BMI 20.54 kg/m   HEMODYNAMICS:    VENTILATOR SETTINGS: Vent Mode: PRVC FiO2 (%):  [30 %] 30 % Set Rate:  [12 bmp] 12 bmp Vt Set:  [530 mL] 530 mL PEEP:  [5 cmH20] 5 cmH20 Pressure Support:  [10 cmH20] 10 cmH20 Plateau Pressure:  [12 cmH20-27 cmH20] 27 cmH20  INTAKE / OUTPUT: I/O last 3 completed shifts: In: 3393.5 [I.V.:1293.5; NG/GT:1600; IV Piggyback:500] Out: 1930 [Urine:1930]     EXAM: General:  Thin female on vent Neuro:  Sedated on vent HEENT:  [place]/AT, No JVD noted, PERRL Cardiovascular:  RRR, no MRG Lungs:  Expiratory wheeze Abdomen:  Soft, non-distended Musculoskeletal:  No acute deformity Skin:  Intact, MMM  LABS  PULMONARY  Recent Labs Lab 09/10/17 1446  PHART 7.236*  PCO2ART 68.0*  PO2ART 518.0*  HCO3 28.9*  TCO2 31  O2SAT 100.0    CBC  Recent Labs Lab 09/09/17 0233 09/11/17 0415 09/13/17 0543  HGB 12.4 11.9* 10.6*  HCT 38.1 36.6 33.6*  WBC 13.3* 11.5* 12.9*  PLT 331 297 197    COAGULATION No results for  input(s): INR in the last 168 hours.  CARDIAC    Recent Labs Lab 09/07/17 0309 09/13/17 0543  TROPONINI <0.03 <0.03   No results for input(s): PROBNP in the last 168 hours.   CHEMISTRY  Recent Labs Lab 09/10/17 0248 09/11/17 0415 09/11/17 0958 09/12/17 0210 09/13/17 0543  NA 141 141 142 139 138  K 3.8 4.1 4.0 4.5 4.6  CL 102 108 111 110 107  CO2 27 21* 21* 21* 24  GLUCOSE 120* 133* 124* 125* 129*  BUN 16 39* 42*  45* 33*  CREATININE 0.80 1.35* 1.30* 0.97 0.67  CALCIUM 10.0 8.9 8.9 8.7* 8.7*  MG  --   --   --  2.7* 2.5*  PHOS  --   --   --  3.8 3.3   Estimated Creatinine Clearance: 81 mL/min (by C-G formula based on SCr of 0.67 mg/dL).   LIVER  Recent Labs Lab 09/10/17 1420  AST 23  ALT 18  ALKPHOS 60  BILITOT 0.8  PROT 8.1  ALBUMIN 4.5     INFECTIOUS  Recent Labs Lab 09/10/17 1201  PROCALCITON <0.10     ENDOCRINE CBG (last 3)   Recent Labs  09/12/17 2348 09/13/17 0341 09/13/17 0758  GLUCAP 152* 150* 130*         IMAGING x48h  - image(s) personally visualized  -   highlighted in bold Dg Chest Port 1 View  Result Date: 09/13/2017 CLINICAL DATA:  Acute exacerbation of COPD. History of CHF and chronic respiratory failure. Intubated patient. EXAM: PORTABLE CHEST 1 VIEW COMPARISON:  Chest x-ray of September 12, 2017 FINDINGS: The lungs are hyperinflated. There is no focal infiltrate. There is no pleural effusion or pneumothorax. The heart and pulmonary vascularity are normal. There is calcification in the wall of the aortic arch. The endotracheal tube tip projects 5.7 cm above the carina. The esophagogastric tube tip and proximal port project below the inferior margin of the image. IMPRESSION: COPD. No pneumonia, CHF, nor other acute cardiopulmonary abnormality. The support tubes are in reasonable position. Thoracic aortic atherosclerosis. Electronically Signed   By: David  Martinique M.D.   On: 09/13/2017 07:02   Dg Chest Port 1 View  Result Date: 09/12/2017 CLINICAL DATA:  Endotracheal tube. EXAM: PORTABLE CHEST 1 VIEW COMPARISON:  09/11/2017 FINDINGS: Endotracheal tube is unchanged. Enteric tube courses into the left upper abdomen with tip not imaged. The cardiomediastinal silhouette is within normal limits. The lungs remain hyperinflated without evidence of airspace consolidation, edema, sizable pleural effusion, or pneumothorax. IMPRESSION: COPD without evidence of active  disease. Electronically Signed   By: Logan Bores M.D.   On: 09/12/2017 07:36       ASSESSMENT and PLAN  COPD with acute exacerbation - Full vent support - Wean as tolerated, limited by agitation - Scheduled bronchodilators. Will add budesonide, Brovana in setting of refractory wheeze.  - VAP bundle - Solumedrol - Doxycycline 10/26 >  Chronic combined CHF - LVEF 55% appears euvolemic. Trop and BNP neg - keep even - Holding home lasix, lisinopril, metoprolol given hypotension while sedated.  - PRN metoprolol  AKI resolved - follow BMP  Nutrition - TF - Protonix for SUP  Acute encephalopathy Agitation - Fentanyl and propofol infusions - Transition to Precedex for agitation and to assist wean. Plan to wean off propofol once precedex on. - RASS goal -1 - Consider CT head if no improvement.   Leukocytosis - afebrile, likely steroid related.   - monitor WBC/fevers.  FAMILY  -  Updates: 09/13/2017 --> Sisters and son updated bedside.   - Inter-disciplinary family meet or Palliative Care meeting due by:  Day 7. Current LOS is LOS 4 days  Georgann Housekeeper, AGACNP-BC [facility] Pulmonology/Critical Care Pager [phone] or [phone]  09/13/2017 9:14 AM  Attending Note:  51 year old female with COPD who is in respiratory failure with severe agitation.  On exam, patient is agonally breathing.  I reviewed CXR myself, ETT is in good position with severe hyperinflation.  Will continue IV steroids.  Add inhaled steroids and bronchodilators.  Continue doxycycline.  F/U on cultures.  Change to PCV.  Check ABG now.  Spoke with both sisters.  Relayed concern for weaning given how poor her lung function is.  They are to discuss trach/peg options.  Hold off extubation for now.  The patient is critically ill with multiple organ systems failure and requires high complexity decision making for assessment and support, frequent evaluation and titration of therapies, application of advanced  monitoring technologies and extensive interpretation of multiple databases.   Critical Care Time devoted to patient care services described in this note is  35  Minutes. This time reflects time of care of this signee Dr Jennet Maduro. This critical care time does not reflect procedure time, or teaching time or supervisory time of PA/NP/Med student/Med Resident etc but could involve care discussion time.  [name], M.D. [facility] Pulmonary/Critical Care Medicine. Pager: [phone]. After hours pager: [phone].

## 2017-09-14 ENCOUNTER — Inpatient Hospital Stay (HOSPITAL_COMMUNITY): Payer: Self-pay

## 2017-09-14 DIAGNOSIS — J9601 Acute respiratory failure with hypoxia: Secondary | ICD-10-CM

## 2017-09-14 DIAGNOSIS — R451 Restlessness and agitation: Secondary | ICD-10-CM

## 2017-09-14 DIAGNOSIS — I959 Hypotension, unspecified: Secondary | ICD-10-CM

## 2017-09-14 DIAGNOSIS — I5041 Acute combined systolic (congestive) and diastolic (congestive) heart failure: Secondary | ICD-10-CM

## 2017-09-14 LAB — BLOOD GAS, ARTERIAL
ACID-BASE EXCESS: 3 mmol/L — AB (ref 0.0–2.0)
Bicarbonate: 27.7 mmol/L (ref 20.0–28.0)
DRAWN BY: 345601
FIO2: 30
O2 SAT: 95.8 %
PEEP/CPAP: 5 cmH2O
PH ART: 7.38 (ref 7.350–7.450)
PO2 ART: 75.9 mmHg — AB (ref 83.0–108.0)
PRESSURE CONTROL: 10 cmH2O
Patient temperature: 98.6
RATE: 12 resp/min
pCO2 arterial: 47.9 mmHg (ref 32.0–48.0)

## 2017-09-14 LAB — PHOSPHORUS: PHOSPHORUS: 2.3 mg/dL — AB (ref 2.5–4.6)

## 2017-09-14 LAB — BRAIN NATRIURETIC PEPTIDE: B Natriuretic Peptide: 201.1 pg/mL — ABNORMAL HIGH (ref 0.0–100.0)

## 2017-09-14 LAB — CBC WITH DIFFERENTIAL/PLATELET
BASOS ABS: 0 10*3/uL (ref 0.0–0.1)
Basophils Relative: 0 %
Eosinophils Absolute: 0 10*3/uL (ref 0.0–0.7)
Eosinophils Relative: 0 %
HEMATOCRIT: 32.8 % — AB (ref 36.0–46.0)
Hemoglobin: 10.5 g/dL — ABNORMAL LOW (ref 12.0–15.0)
LYMPHS PCT: 14 %
Lymphs Abs: 1.7 10*3/uL (ref 0.7–4.0)
MCH: 28.6 pg (ref 26.0–34.0)
MCHC: 32 g/dL (ref 30.0–36.0)
MCV: 89.4 fL (ref 78.0–100.0)
Monocytes Absolute: 2 10*3/uL — ABNORMAL HIGH (ref 0.1–1.0)
Monocytes Relative: 16 %
NEUTROS ABS: 8.8 10*3/uL — AB (ref 1.7–7.7)
NEUTROS PCT: 70 %
Platelets: 190 10*3/uL (ref 150–400)
RBC: 3.67 MIL/uL — AB (ref 3.87–5.11)
RDW: 15.3 % (ref 11.5–15.5)
WBC: 12.6 10*3/uL — AB (ref 4.0–10.5)

## 2017-09-14 LAB — BASIC METABOLIC PANEL
ANION GAP: 3 — AB (ref 5–15)
BUN: 37 mg/dL — ABNORMAL HIGH (ref 6–20)
CO2: 28 mmol/L (ref 22–32)
Calcium: 8.7 mg/dL — ABNORMAL LOW (ref 8.9–10.3)
Chloride: 108 mmol/L (ref 101–111)
Creatinine, Ser: 0.58 mg/dL (ref 0.44–1.00)
GFR calc Af Amer: >60 mL/min (ref >60)
GFR calc non Af Amer: >60 mL/min (ref >60)
GLUCOSE: 103 mg/dL — AB (ref 65–99)
POTASSIUM: 4.4 mmol/L (ref 3.5–5.1)
Sodium: 139 mmol/L (ref 135–145)

## 2017-09-14 LAB — GLUCOSE, CAPILLARY
GLUCOSE-CAPILLARY: 108 mg/dL — AB (ref 65–99)
GLUCOSE-CAPILLARY: 114 mg/dL — AB (ref 65–99)
GLUCOSE-CAPILLARY: 133 mg/dL — AB (ref 65–99)
GLUCOSE-CAPILLARY: 142 mg/dL — AB (ref 65–99)
Glucose-Capillary: 103 mg/dL — ABNORMAL HIGH (ref 65–99)
Glucose-Capillary: 127 mg/dL — ABNORMAL HIGH (ref 65–99)

## 2017-09-14 LAB — MAGNESIUM: Magnesium: 2.2 mg/dL (ref 1.7–2.4)

## 2017-09-14 MED ORDER — ROCURONIUM BROMIDE 50 MG/5ML IV SOLN
50.0000 mg | Freq: Once | INTRAVENOUS | Status: AC
  Administered 2017-09-14: 50 mg via INTRAVENOUS

## 2017-09-14 MED ORDER — CHLORHEXIDINE GLUCONATE CLOTH 2 % EX PADS
6.0000 | MEDICATED_PAD | Freq: Every day | CUTANEOUS | Status: DC
  Administered 2017-09-14 – 2017-09-22 (×9): 6 via TOPICAL

## 2017-09-14 MED ORDER — SODIUM PHOSPHATES 45 MMOLE/15ML IV SOLN
10.0000 mmol | Freq: Once | INTRAVENOUS | Status: AC
  Administered 2017-09-14: 10 mmol via INTRAVENOUS
  Filled 2017-09-14: qty 3.33

## 2017-09-14 MED ORDER — MIDAZOLAM HCL 2 MG/2ML IJ SOLN
INTRAMUSCULAR | Status: AC
  Filled 2017-09-14: qty 2

## 2017-09-14 MED ORDER — HALOPERIDOL LACTATE 5 MG/ML IJ SOLN
INTRAMUSCULAR | Status: AC
  Filled 2017-09-14: qty 1

## 2017-09-14 MED ORDER — ETOMIDATE 2 MG/ML IV SOLN
20.0000 mg | Freq: Once | INTRAVENOUS | Status: AC
  Administered 2017-09-14: 20 mg via INTRAVENOUS

## 2017-09-14 MED ORDER — FUROSEMIDE 10 MG/ML IJ SOLN
40.0000 mg | Freq: Three times a day (TID) | INTRAMUSCULAR | Status: AC
  Administered 2017-09-14 (×2): 40 mg via INTRAVENOUS
  Filled 2017-09-14 (×2): qty 4

## 2017-09-14 MED ORDER — PROPOFOL 1000 MG/100ML IV EMUL
5.0000 ug/kg/min | INTRAVENOUS | Status: DC
  Administered 2017-09-14: 5 ug/kg/min via INTRAVENOUS
  Administered 2017-09-15: 35 ug/kg/min via INTRAVENOUS
  Administered 2017-09-15: 10 ug/kg/min via INTRAVENOUS
  Administered 2017-09-15: 15 ug/kg/min via INTRAVENOUS
  Administered 2017-09-16: 10 ug/kg/min via INTRAVENOUS
  Filled 2017-09-14 (×6): qty 100

## 2017-09-14 MED ORDER — SODIUM CHLORIDE 0.9% FLUSH
10.0000 mL | Freq: Two times a day (BID) | INTRAVENOUS | Status: DC
  Administered 2017-09-14 – 2017-09-21 (×13): 10 mL
  Administered 2017-09-22: 20 mL

## 2017-09-14 MED ORDER — HALOPERIDOL LACTATE 5 MG/ML IJ SOLN
2.0000 mg | INTRAMUSCULAR | Status: DC | PRN
  Administered 2017-09-14 (×4): 2 mg via INTRAVENOUS
  Filled 2017-09-14 (×3): qty 1

## 2017-09-14 MED ORDER — MIDAZOLAM HCL 2 MG/2ML IJ SOLN
2.0000 mg | Freq: Once | INTRAMUSCULAR | Status: AC
  Administered 2017-09-14: 2 mg via INTRAVENOUS

## 2017-09-14 MED ORDER — SODIUM CHLORIDE 0.9% FLUSH
10.0000 mL | INTRAVENOUS | Status: DC | PRN
  Administered 2017-09-20: 10 mL
  Filled 2017-09-14: qty 40

## 2017-09-14 MED ORDER — FENTANYL CITRATE (PF) 100 MCG/2ML IJ SOLN
INTRAMUSCULAR | Status: AC
  Filled 2017-09-14: qty 2

## 2017-09-14 MED ORDER — FENTANYL CITRATE (PF) 100 MCG/2ML IJ SOLN
100.0000 ug | Freq: Once | INTRAMUSCULAR | Status: AC
  Administered 2017-09-14: 100 ug via INTRAVENOUS

## 2017-09-14 NOTE — Procedures (Signed)
Extubation Procedure Note  Patient Details:   Name: Caitlin [name] DOB: 10-27-1966 MRN: [id number]    Evaluation  O2 sats: stable throughout Complications: Complications of stridor and agonal work of breathing. Patient did not tolerate procedure well. Bilateral Breath Sounds: Inspiratory wheezes, Expiratory wheezes, Coarse crackles   No   Patient extubated to 2L [place] per MD order. Positive cuff leak was noted prior to extubation. Immediately after extubation patient developed stridor and had increased work of breathing. MD made aware and patient was emergently reintubated. Vitals are stable and sats are 99%. RT will continue to monitor.   Cniyah Sproull Clyda Greener 09/14/2017, 10:22 AM

## 2017-09-14 NOTE — Progress Notes (Signed)
SLP Cancellation Note  Patient Details Name: Caitlin [name] MRN: [id number] DOB: 1966-09-09   Cancelled treatment:       Reason Eval/Treat Not Completed: Patient not medically ready. Orders received for swallow evaluation post-extubation but pt was emergently reintubated. Will f/u for readiness.   Germain Osgood 09/14/2017, 10:59 AM  Germain Osgood, M.A. CCC-SLP [phone]

## 2017-09-14 NOTE — Progress Notes (Signed)
This note also relates to the following rows which could not be included: SpO2 - Cannot attach notes to unvalidated device data  Placed patient back on full support.

## 2017-09-14 NOTE — Procedures (Signed)
OGT Placement By MD  OGT placed under direct laryngoscopy and confirmed by auscultation.  [name], M.D. [facility] Pulmonary/Critical Care Medicine. Pager: [phone]. After hours pager: [phone]

## 2017-09-14 NOTE — Progress Notes (Signed)
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  She also has a 30-pack-year history of smoking and continues to smoke.  She been living with her sister and making progress for rehabilitation standpoint.  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  She took her rescue bronchodilators without much relief because of this she went to the emergency room.  She was given bronchodilator therapy, and sent home with a prescription of prednisone.  Her symptoms did not get better, she returned to the emergency room on the 24th with resting shortness of breath, and almost continuous use of her rescue bronchodilator without relief.  Her initial chest x-ray was without infiltrate or evidence of edema, she had no sick exposures, no fever or chills.  She was admitted with a working diagnosis of acute exacerbation chronic obstructive pulmonary disease, and treated appropriately with supplemental oxygen, inhaled bronchodilators, and systemic steroids.  On the evening of 10/25 she developed acute distress, was found by nursing staff tripod in bed.  Was treated with IV Lasix, pulse IV steroid, and placed on noninvasive positive pressure ventilation.  Throughout the course of the evening and going into the a.m. hours of 10/26.  Her work of breathing has remained quite elevated.  She has been on and off BiPAP, continues to have cough, continues to wheeze, has not been getting much relief from rescue bronchodilation.  Critical care was asked to  see because of work of breathing, noninvasive positive pressure ventilation requirements, and concern about clinical deterioration.   EVENTS 09/08/2017 - admit 09/10/17 - intubated  SUBJECTIVE/OVERNIGHT/INTERVAL HX 10/28 - Unable to wean due to agitation and persistent bronchospasm. 10/29 - agitation overnight, also with bradycardia down to 30s so precedex and propofol held. She was noted to have pauses on cardiac monitoring which may be consistent with heart block.  VITAL SIGNS: BP 104/65   Pulse 87   Temp 98.2 F (36.8 C) (Axillary)   Resp 15   Ht 5\' 9"  (1.753 m)   Wt 137 lb 9.1 oz (62.4 kg)   LMP 09/09/2014 (Approximate)   SpO2 98%   BMI 20.32 kg/m   HEMODYNAMICS:    VENTILATOR SETTINGS: Vent Mode: PCV FiO2 (%):  [30 %] 30 % Set Rate:  [12 bmp] 12 bmp PEEP:  [5 cmH20] 5 cmH20 Pressure Support:  [10 cmH20] 10 cmH20 Plateau Pressure:  [14 cmH20-21 cmH20] 14 cmH20  INTAKE / OUTPUT: I/O last 3 completed shifts: In: 3304 [I.V.:1054; Other:80; NG/GT:1420; IV Piggyback:750] Out: 1990 [Urine:1990]   EXAM: General: thin female on vent, eyes roll back, agitated Neuro:  +agitation, not following commands HEENT:  MMM, MV Cardiovascular:  RRR, no m/r/g Lungs:  +coarse breath sounds anteriorly with expiratory wheeze Abdomen:  Soft, non-distended Musculoskeletal:  No acute deformity Skin:  Intact, MMM  LABS  PULMONARY  Recent Labs Lab 09/10/17 1446 09/13/17 1117 09/14/17 0425  PHART 7.236* 7.348* 7.380  PCO2ART 68.0* 47.1 47.9  PO2ART 518.0* 59.0* 75.9*  HCO3 28.9* 25.9 27.7  TCO2 31 27  --   O2SAT 100.0 88.0 95.8  CBC  Recent Labs Lab 09/11/17 0415 09/13/17 0543 09/14/17 0333  HGB 11.9* 10.6* 10.5*  HCT 36.6 33.6* 32.8*  WBC 11.5* 12.9* 12.6*  PLT 297 197 190    COAGULATION No results for input(s): INR in the last 168 hours.  CARDIAC    Recent Labs Lab 09/13/17 0543  TROPONINI <0.03   No results for input(s): PROBNP in the last 168  hours.   CHEMISTRY  Recent Labs Lab 09/11/17 0415 09/11/17 0958 09/12/17 0210 09/13/17 0543 09/14/17 0333  NA 141 142 139 138 139  K 4.1 4.0 4.5 4.6 4.4  CL 108 111 110 107 108  CO2 21* 21* 21* 24 28  GLUCOSE 133* 124* 125* 129* 103*  BUN 39* 42* 45* 33* 37*  CREATININE 1.35* 1.30* 0.97 0.67 0.58  CALCIUM 8.9 8.9 8.7* 8.7* 8.7*  MG  --   --  2.7* 2.5* 2.2  PHOS  --   --  3.8 3.3 2.3*   Estimated Creatinine Clearance: 80.1 mL/min (by C-G formula based on SCr of 0.58 mg/dL).  LIVER  Recent Labs Lab 09/10/17 1420  AST 23  ALT 18  ALKPHOS 60  BILITOT 0.8  PROT 8.1  ALBUMIN 4.5    INFECTIOUS  Recent Labs Lab 09/10/17 1201  PROCALCITON <0.10     ENDOCRINE CBG (last 3)   Recent Labs  09/14/17 0028 09/14/17 0332 09/14/17 0735  GLUCAP 142* 103* 108*    IMAGING x48h  - image(s) personally visualized  -   highlighted in bold Dg Chest Port 1 View  Result Date: 09/13/2017 CLINICAL DATA:  Acute exacerbation of COPD. History of CHF and chronic respiratory failure. Intubated patient. EXAM: PORTABLE CHEST 1 VIEW COMPARISON:  Chest x-ray of September 12, 2017 FINDINGS: The lungs are hyperinflated. There is no focal infiltrate. There is no pleural effusion or pneumothorax. The heart and pulmonary vascularity are normal. There is calcification in the wall of the aortic arch. The endotracheal tube tip projects 5.7 cm above the carina. The esophagogastric tube tip and proximal port project below the inferior margin of the image. IMPRESSION: COPD. No pneumonia, CHF, nor other acute cardiopulmonary abnormality. The support tubes are in reasonable position. Thoracic aortic atherosclerosis. Electronically Signed   By: David  Martinique M.D.   On: 09/13/2017 07:02   ASSESSMENT and PLAN  PULM COPD with acute exacerbation - Full vent support - Wean as tolerated, limited by agitation - Albuterol 2.5 mg nebs q4H PRN wheezing. Brovana BID, budesnoide BID, DUONEBs scheduled q6H -  Solumedrol 40 mg q12H - Doxycycline 10/26 >  CARDIAC Chronic combined CHF - LVEF 55% appears euvolemic. Trop neg BNP 50.8>>201.1 ?Heart block - Holding home lasix, lisinopril, metoprolol given hypotension while sedated.  - PRN metoprolol >> consider switching to hydral if patient is brady  RENAL AKI resolved - follow BMP  GI Nutrition - TF - Protonix for SUP  NEURO Acute encephalopathy Agitation - Fentanyl and propofol infusions for sedation Clonazepam 0.5 mg TID - Holding propofol and precedex for bradycardia - RASS goal -1 - Consider CT head if no improvement.   HEME Leukocytosis - afebrile, likely steroid related.   - monitor WBC/fevers.  FAMILY  - Updates: 09/14/2017 --> Sisters and son updated bedside.   - Inter-disciplinary family meet or Palliative Care meeting due by:  Day 7. Current LOS is LOS 5 days  Everrett Coombe, MD PGY-2 [facility]  Attending Note:  51 year old female with severe COPD who presents with  COPD exacerbation.  Bradycardia overnight with sedation.  On exam, wheezing improved compared to yesterday.  I reviewed CXR myself, ETT ok, RLL atelectasis noted.  Begin weaning today.  Will trial extubate today, if fails will reintubate and place TLC and heavily sedate and call cards.  If brady again once extubated will call cards.  Replace electrolytes.  Monitor tele.  Family updated bedside.  The patient is critically ill with multiple organ systems failure and requires high complexity decision making for assessment and support, frequent evaluation and titration of therapies, application of advanced monitoring technologies and extensive interpretation of multiple databases.   Critical Care Time devoted to patient care services described in this note is  35  Minutes. This time reflects time of care of this signee Dr Jennet Maduro. This critical care time does not reflect procedure time, or teaching time or supervisory time of PA/NP/Med  student/Med Resident etc but could involve care discussion time.  [name], M.D. [facility] Pulmonary/Critical Care Medicine. Pager: [phone]. After hours pager: [phone].

## 2017-09-14 NOTE — Progress Notes (Signed)
Wasted 25 mL of versed. Witnessed by Damita Dunnings, RN.

## 2017-09-14 NOTE — Procedures (Signed)
Intubation Procedure Note Caitlin [name] [id number] 08-Apr-1966  Procedure: Intubation Indications: Respiratory insufficiency  Procedure Details Consent: Risks of procedure as well as the alternatives and risks of each were explained to the (patient/caregiver).  Consent for procedure obtained. Time Out: Verified patient identification, verified procedure, site/side was marked, verified correct patient position, special equipment/implants available, medications/allergies/relevent history reviewed, required imaging and test results available.  Performed  Maximum sterile technique was used including mask. 3  OG tube placed following successful intubation. The tube was guided visually with the Glidescope and observed to pass into the esophagus. Location was confirmed by auscultating air expelled from the OG tube.   Versed 2mg  Fentynal 200mg  Etomidate 20 Rocuronium 50  Evaluation Hemodynamic Status: BP stable throughout; O2 sats: stable throughout Patient's Current Condition: stable Complications: No apparent complications Patient did tolerate procedure well. Chest X-ray ordered to verify placement.  CXR: ET tube elevated significantly above the carina, to be advanced 2cm to 24 at the lips.   Kathi Ludwig, MD 09/14/2017  I supervised the entire procedure and was present.  [name], M.D. [facility] Pulmonary/Critical Care Medicine. Pager: [phone]. After hours pager: [phone]

## 2017-09-14 NOTE — Progress Notes (Signed)
[place] Progress Note Patient Name: LAYANA KONKEL DOB: Sep 02, 1966 MRN: [id number]   Date of Service  09/14/2017  HPI/Events of Note  Remains agitated. - QTc interval = 0.37 seconds.   eICU Interventions  Will order: 1. Haldol 2 mg IV Q 3 hours PRN agitation.  2. Monitor QTc interval Q 6 hours. Notify MD if QTc interval > 500 milliseconds.     Intervention Category Minor Interventions: Agitation / anxiety - evaluation and management  Sommer,Steven Eugene 09/14/2017, 1:41 AM

## 2017-09-14 NOTE — Procedures (Signed)
Central Venous Catheter Insertion Procedure Note Caitlin [name] [id number] 01/14/1966  Procedure: Insertion of Central Venous Catheter Indications: Assessment of intravascular volume, Drug and/or fluid administration and Frequent blood sampling  Procedure Details Consent: Risks of procedure as well as the alternatives and risks of each were explained to the (patient/caregiver).  Consent for procedure obtained. Time Out: Verified patient identification, verified procedure, site/side was marked, verified correct patient position, special equipment/implants available, medications/allergies/relevent history reviewed, required imaging and test results available.  Performed  Maximum sterile technique was used including antiseptics, cap, gloves, gown, hand hygiene, mask and sheet. Skin prep: Chlorhexidine; local anesthetic administered A antimicrobial bonded/coated triple lumen catheter was placed in the right internal jugular vein using the Seldinger technique.  Evaluation Blood flow good Complications: No apparent complications Patient did tolerate procedure well. Chest X-ray ordered to verify placement.  CXR: pending.  Procedure performed under direct ultrasound guidance for real time vessel cannulation.      Caitlin [name], Caitlin [name] Pulmonary & Critical Care Medicine Pager: [phone]  or [phone] 09/14/2017, 10:50 AM  I was present and supervised procedure.  Caitlin [name], M.D. [facility] Pulmonary/Critical Care Medicine. Pager: [phone]. After hours pager: [phone].

## 2017-09-14 NOTE — Progress Notes (Signed)
RT advanced ETT 2cm per MD order to 24 at the lips. Vitals are stable. RT will continue to monitor.

## 2017-09-14 NOTE — Procedures (Signed)
Intubation Procedure Note Caitlin [name] [id number] Mar 01, 1966  Procedure: Intubation Indications: Respiratory insufficiency  Procedure Details Consent: Unable to obtain consent because of emergent medical necessity. Time Out: Verified patient identification, verified procedure, site/side was marked, verified correct patient position, special equipment/implants available, medications/allergies/relevent history reviewed, required imaging and test results available.  Performed  Maximum sterile technique was used including gloves, gown, hand hygiene and mask.  MAC and 4    Evaluation Hemodynamic Status: BP stable throughout; O2 sats: stable throughout Patient's Current Condition: stable Complications: No apparent complications Patient did tolerate procedure well. Chest X-ray ordered to verify placement.  CXR: pending.   Patient emergently reintubated using glidescope #4 Mac with 7.5 ETT secured at 22 at the lips. Positive color change on the CO2 detector. Bilateral breath sounds and bilateral chest rise was noted. Xray is pending to confirm placement. Vitals are stable. RT will continue to monitor.   Baraa Tubbs Clyda Greener 09/14/2017

## 2017-09-15 ENCOUNTER — Inpatient Hospital Stay (HOSPITAL_COMMUNITY): Payer: Self-pay

## 2017-09-15 LAB — GLUCOSE, CAPILLARY
GLUCOSE-CAPILLARY: 123 mg/dL — AB (ref 65–99)
GLUCOSE-CAPILLARY: 106 mg/dL — AB (ref 65–99)
GLUCOSE-CAPILLARY: 145 mg/dL — AB (ref 65–99)
GLUCOSE-CAPILLARY: 119 mg/dL — AB (ref 65–99)
Glucose-Capillary: 129 mg/dL — ABNORMAL HIGH (ref 65–99)

## 2017-09-15 LAB — BLOOD GAS, ARTERIAL
Acid-Base Excess: 10.6 mmol/L — ABNORMAL HIGH (ref 0.0–2.0)
Bicarbonate: 35.6 mmol/L — ABNORMAL HIGH (ref 20.0–28.0)
DRAWN BY: 418751
FIO2: 40
LHR: 12 {breaths}/min
O2 Saturation: 97.8 %
PEEP: 5 cmH2O
PH ART: 7.417 (ref 7.350–7.450)
Patient temperature: 98.6
Pressure control: 10 cmH2O
pCO2 arterial: 56.3 mmHg — ABNORMAL HIGH (ref 32.0–48.0)
pO2, Arterial: 108 mmHg (ref 83.0–108.0)

## 2017-09-15 LAB — BASIC METABOLIC PANEL
Anion gap: 8 (ref 5–15)
BUN: 23 mg/dL — AB (ref 6–20)
CALCIUM: 8.6 mg/dL — AB (ref 8.9–10.3)
CO2: 35 mmol/L — ABNORMAL HIGH (ref 22–32)
Chloride: 96 mmol/L — ABNORMAL LOW (ref 101–111)
Creatinine, Ser: 0.59 mg/dL (ref 0.44–1.00)
GFR calc Af Amer: >60 mL/min (ref >60)
GLUCOSE: 115 mg/dL — AB (ref 65–99)
POTASSIUM: 3.4 mmol/L — AB (ref 3.5–5.1)
SODIUM: 139 mmol/L (ref 135–145)

## 2017-09-15 LAB — CBC WITH DIFFERENTIAL/PLATELET
Basophils Absolute: 0 10*3/uL (ref 0.0–0.1)
Basophils Relative: 0 %
EOS ABS: 0.1 10*3/uL (ref 0.0–0.7)
EOS PCT: 0 %
HCT: 34.3 % — ABNORMAL LOW (ref 36.0–46.0)
Hemoglobin: 11 g/dL — ABNORMAL LOW (ref 12.0–15.0)
LYMPHS ABS: 2.7 10*3/uL (ref 0.7–4.0)
LYMPHS PCT: 23 %
MCH: 28.3 pg (ref 26.0–34.0)
MCHC: 32.1 g/dL (ref 30.0–36.0)
MCV: 88.2 fL (ref 78.0–100.0)
MONOS PCT: 14 %
Monocytes Absolute: 1.7 10*3/uL — ABNORMAL HIGH (ref 0.1–1.0)
Neutro Abs: 7.6 10*3/uL (ref 1.7–7.7)
Neutrophils Relative %: 63 %
PLATELETS: 233 10*3/uL (ref 150–400)
RBC: 3.89 MIL/uL (ref 3.87–5.11)
RDW: 14.6 % (ref 11.5–15.5)
WBC: 12.2 10*3/uL — AB (ref 4.0–10.5)

## 2017-09-15 LAB — PHOSPHORUS: Phosphorus: 3.3 mg/dL (ref 2.5–4.6)

## 2017-09-15 LAB — MAGNESIUM: Magnesium: 1.9 mg/dL (ref 1.7–2.4)

## 2017-09-15 MED ORDER — POTASSIUM PHOSPHATES 15 MMOLE/5ML IV SOLN
30.0000 mmol | Freq: Once | INTRAVENOUS | Status: DC
  Administered 2017-09-15: 30 mmol via INTRAVENOUS
  Filled 2017-09-15: qty 10

## 2017-09-15 MED ORDER — VITAL AF 1.2 CAL PO LIQD
1000.0000 mL | ORAL | Status: DC
  Administered 2017-09-15 – 2017-09-20 (×5): 1000 mL
  Filled 2017-09-15 (×2): qty 1000

## 2017-09-15 MED ORDER — FUROSEMIDE 10 MG/ML IJ SOLN
40.0000 mg | Freq: Four times a day (QID) | INTRAMUSCULAR | Status: AC
  Administered 2017-09-15 (×3): 40 mg via INTRAVENOUS
  Filled 2017-09-15 (×3): qty 4

## 2017-09-15 MED ORDER — DOCUSATE SODIUM 50 MG/5ML PO LIQD
100.0000 mg | Freq: Two times a day (BID) | ORAL | Status: DC
  Administered 2017-09-15 – 2017-09-21 (×12): 100 mg
  Filled 2017-09-15 (×12): qty 10

## 2017-09-15 MED ORDER — MAGNESIUM SULFATE 2 GM/50ML IV SOLN
2.0000 g | Freq: Once | INTRAVENOUS | Status: AC
  Administered 2017-09-15: 2 g via INTRAVENOUS
  Filled 2017-09-15: qty 50

## 2017-09-15 MED ORDER — POTASSIUM CHLORIDE 20 MEQ/15ML (10%) PO SOLN
40.0000 meq | Freq: Once | ORAL | Status: AC
  Administered 2017-09-15: 40 meq via ORAL
  Filled 2017-09-15: qty 30

## 2017-09-15 MED ORDER — POTASSIUM PHOSPHATES 15 MMOLE/5ML IV SOLN
20.0000 mmol | Freq: Once | INTRAVENOUS | Status: DC
  Filled 2017-09-15: qty 6.67

## 2017-09-15 NOTE — Progress Notes (Signed)
PT Cancellation Note  Patient Details Name: Caitlin [name] MRN: [id number] DOB: [birth year]   Cancelled Treatment:    Reason Eval/Treat Not Completed: Medical issues which prohibited therapy. Pt intubated and sedated.   Shary Decamp [place] 09/15/2017, 12:02 PM [employer] PT [phone]

## 2017-09-15 NOTE — Progress Notes (Signed)
SLP Cancellation Note  Patient Details Name: LAVONIA EAGER MRN: [id number] DOB: [birth year]   Cancelled treatment:       Reason Eval/Treat Not Completed: Patient not medically ready. Per MD note, pt is intubated and needing full vent support, but they will try weaning as tolerated. SLP to sign off for now - please reorder when ready.   Germain Osgood 09/15/2017, 8:58 AM  Germain Osgood, M.A. CCC-SLP [phone]

## 2017-09-15 NOTE — Progress Notes (Signed)
[facility] Progress Note Patient Name: TIANI STANBERY DOB: [birth year] MRN: [id number]   Date of Service  09/15/2017  HPI/Events of Note  Patient on heavy sedation and endotracheally intubated. Nurse requesting renewal of restraints.   eICU Interventions  Restraints renewed for 12 hours      Intervention Category Intermediate Interventions: Other:  Tera Partridge 09/15/2017, 7:49 PM

## 2017-09-15 NOTE — Progress Notes (Signed)
PT Cancellation Note  Patient Details Name: Caitlin [name] MRN: [id number] DOB: 1966-02-26   Cancelled Treatment:    Reason Eval/Treat Not Completed: Medical issues which prohibited therapy. Pt currently with bed rest orders in place. PT will continue to f/u with pt and await updated activity orders prior to initiating evaluation.    [place] 09/15/2017, 8:29 AM

## 2017-09-15 NOTE — Progress Notes (Signed)
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  She also has a 30-pack-year history of smoking and continues to smoke.  She been living with her sister and making progress for rehabilitation standpoint.  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  She took her rescue bronchodilators without much relief because of this she went to the emergency room.  She was given bronchodilator therapy, and sent home with a prescription of prednisone.  Her symptoms did not get better, she returned to the emergency room on the 24th with resting shortness of breath, and almost continuous use of her rescue bronchodilator without relief.  Her initial chest x-ray was without infiltrate or evidence of edema, she had no sick exposures, no fever or chills.  She was admitted with a working diagnosis of acute exacerbation chronic obstructive pulmonary disease, and treated appropriately with supplemental oxygen, inhaled bronchodilators, and systemic steroids.  On the evening of 10/25 she developed acute distress, was found by nursing staff tripod in bed.  Was treated with IV Lasix, pulse IV steroid, and placed on noninvasive positive pressure ventilation.  Throughout the course of the evening and going into the a.m. hours of 10/26.  Her work of breathing has remained quite elevated.  She has been on and off BiPAP, continues to have cough, continues to wheeze, has not been getting much relief from rescue bronchodilation.  Critical care was asked to  see because of work of breathing, noninvasive positive pressure ventilation requirements, and concern about clinical deterioration.   EVENTS 09/08/2017 - admit 09/10/17 - intubated  SUBJECTIVE/OVERNIGHT/INTERVAL HX 10/28 - Unable to wean due to agitation and persistent bronchospasm. 10/29 - agitation overnight, also with bradycardia down to 30s so precedex and propofol held. She was noted to have pauses on cardiac monitoring which may be consistent with heart block. 10/31 no acute events overnight. Patient sedated on fentanyl versed and propofol.  VITAL SIGNS: BP 94/61   Pulse (!) 120   Temp 99.2 F (37.3 C) (Oral)   Resp 12   Ht 5\' 9"  (1.753 m)   Wt 132 lb 11.5 oz (60.2 kg)   LMP 09/09/2014 (Approximate)   SpO2 100%   BMI 19.60 kg/m   HEMODYNAMICS:    VENTILATOR SETTINGS: Vent Mode: PCV FiO2 (%):  [30 %-40 %] 40 % Set Rate:  [12 bmp] 12 bmp PEEP:  [5 cmH20] 5 cmH20 Pressure Support:  [10 cmH20] 10 cmH20 Plateau Pressure:  [12 cmH20-16 cmH20] 14 cmH20  INTAKE / OUTPUT: I/O last 3 completed shifts: In: 2574.4 [I.V.:642.4; CH/EN:2778; IV EUMPNTIRW:431] Out: 3885 [Urine:3885]   EXAM: General: MV female, rests in bed, wakes to name, sedated Neuro:  +opens eyes to name, not following commands this AM HEENT:  MMM, MV  Cardiovascular:  RRR, no m/r/g Lungs:  +expiratory wheezes, +prolonged expiratory phase Abdomen:  Soft, non-distended Musculoskeletal:  No acute deformity Skin:  Intact, MMM  LABS  PULMONARY  Recent Labs Lab 09/10/17 1446 09/13/17 1117 09/14/17 0425 09/15/17 0413  PHART 7.236* 7.348* 7.380 7.417  PCO2ART 68.0* 47.1 47.9 56.3*  PO2ART 518.0* 59.0* 75.9* 108  HCO3 28.9* 25.9 27.7 35.6*  TCO2 31 27  --   --   O2SAT 100.0 88.0 95.8 97.8    CBC  Recent Labs Lab 09/13/17 0543 09/14/17 0333 09/15/17 0505  HGB 10.6* 10.5* 11.0*  HCT 33.6* 32.8* 34.3*  WBC 12.9* 12.6* 12.2*  PLT 197 190 233    COAGULATION No results for input(s):  INR in the last 168 hours.  CARDIAC    Recent Labs Lab 09/13/17 0543  TROPONINI <0.03   No results for input(s): PROBNP in the last 168 hours.   CHEMISTRY  Recent Labs Lab 09/11/17 0958 09/12/17 0210 09/13/17 0543 09/14/17 0333 09/15/17 0505  NA 142 139 138 139 139  K 4.0 4.5 4.6 4.4 3.4*  CL 111 110 107 108 96*  CO2 21* 21* 24 28 35*  GLUCOSE 124* 125* 129* 103* 115*  BUN 42* 45* 33* 37* 23*  CREATININE 1.30* 0.97 0.67 0.58 0.59  CALCIUM 8.9 8.7* 8.7* 8.7* 8.6*  MG  --  2.7* 2.5* 2.2 1.9  PHOS  --  3.8 3.3 2.3* 3.3   Estimated Creatinine Clearance: 77.3 mL/min (by C-G formula based on SCr of 0.59 mg/dL).  LIVER  Recent Labs Lab 09/10/17 1420  AST 23  ALT 18  ALKPHOS 60  BILITOT 0.8  PROT 8.1  ALBUMIN 4.5   INFECTIOUS  Recent Labs Lab 09/10/17 1201  PROCALCITON <0.10   ENDOCRINE CBG (last 3)   Recent Labs  09/14/17 2007 09/15/17 0343 09/15/17 0731  GLUCAP 133* 123* 106*   IMAGING x48h  - image(s) personally visualized  -   highlighted in bold Dg Chest Port 1 View  Result Date: 09/14/2017 CLINICAL DATA:  Check central line placement EXAM: PORTABLE CHEST 1 VIEW COMPARISON:  09/14/2017 FINDINGS: Right jugular central line is now noted in the mid superior vena cava. No pneumothorax is noted. The lungs remain clear. Endotracheal tube and nasogastric catheter are again noted and stable. Previously seen left basilar atelectasis has resolved. Cardiac shadow is stable. Aortic calcifications are noted. IMPRESSION: No pneumothorax following right jugular line placement. Electronically Signed   By: Inez Catalina M.D.   On: 09/14/2017 11:15   Dg Chest Port 1 View  Result Date: 09/14/2017 CLINICAL DATA:  Acute respiratory failure. On ventilator. Congestive heart failure. COPD. Non ST elevated myocardial infarction. EXAM: PORTABLE CHEST 1 VIEW COMPARISON:  09/13/2017 FINDINGS: Endotracheal tube and nasogastric tube remain in appropriate position. Patient is  rotated to the left. Mild atelectasis noted at the left lung base. No evidence of pulmonary consolidation or edema. No evidence of pneumothorax or pleural effusion. IMPRESSION: Mild left basilar atelectasis.  No pneumothorax visualized. Electronically Signed   By: Earle Gell M.D.   On: 09/14/2017 08:53   ASSESSMENT and PLAN  PULM COPD with acute exacerbation - Full vent support - Wean as tolerated, limited by agitation - Albuterol 2.5 mg nebs q4H PRN wheezing. Brovana BID, budesnoide BID, DUONEBs scheduled q6H - Solumedrol 40 mg q12H - Doxycycline 10/26 >  CARDIAC Chronic combined CHF - LVEF 55% appears euvolemic. Trop neg BNP 50.8>>201.1 ?Heart block - Holding home lisinopril, metoprolol given hypotension while sedated.  - holding PRN metoprolol and clonazepam given periods of bradycardia yesterday  RENAL AKI resolved - follow BMP  GI Nutrition - TF - Protonix for SUP  NEURO Acute encephalopathy Agitation - fentanyl, versed, propofol for sedation - Holding precedex for bradycardia - RASS goal -1 - Consider CT head if no  improvement.   HEME Leukocytosis - afebrile, likely steroid related.   - monitor WBC/fevers.  FAMILY  - Updates: 09/15/2017 --> Sisters and son updated bedside.   - Inter-disciplinary family meet or Palliative Care meeting due by:  Day 7. Current LOS is LOS 6 days  Everrett Coombe, MD PGY-2 [facility]  Attending Note:  51 year old female with PMH of COPD and CHF who is now in respiratory failure due to COPD exacerbation.  On exam, continues to wheeze.  Agitation overnight.  I reviewed CXR myself, hyperinflation noted.  Discussed with resident.  Will continue full vent support for today.  Anticipate will need tracheostomy sooner rather than later given agitation and respiratory failure if family wishes to continue.  Continue sedation with versed/fentanyl/propofol.  Schedule laxatives.  Hold off weaning.  Family ok with  tracheostomy.  Will diurese today, attempt wean again in AM.  If continues to fail then will need to consider tracheostomy.  Consent for tracheostomy obtained.  The patient is critically ill with multiple organ systems failure and requires high complexity decision making for assessment and support, frequent evaluation and titration of therapies, application of advanced monitoring technologies and extensive interpretation of multiple databases.   Critical Care Time devoted to patient care services described in this note is  35  Minutes. This time reflects time of care of this signee Dr Jennet Maduro. This critical care time does not reflect procedure time, or teaching time or supervisory time of PA/NP/Med student/Med Resident etc but could involve care discussion time.  [name], M.D. [facility] Pulmonary/Critical Care Medicine. Pager: [phone]. After hours pager: [phone].

## 2017-09-15 NOTE — Progress Notes (Signed)
Nutrition Follow-up  DOCUMENTATION CODES:   Underweight  INTERVENTION:   Change TF regimen to better meet re-estimated nutrition needs:  Vital AF 1.2 via OGT at 50 ml/h (1200 ml per day)   Provides 1440 kcal (1575 total kcal with Propofol), 90 gm protein, 973 ml free water daily  NUTRITION DIAGNOSIS:   Inadequate oral intake related to inability to eat as evidenced by NPO status.  Ongoing  GOAL:   Patient will meet greater than or equal to 90% of their needs  Being addressed with TF  MONITOR:   Vent status, TF tolerance, Labs, I & O's  ASSESSMENT:   51 yo female with PMH of COPD, CHF, NSTEMI who was admitted on 10/24 with COPD exacerbation; required intubation on 10/26.  Discussed patient in ICU rounds and with RN today. Patient was extubated 10/30, but quickly required re-intubation. Patient is currently receiving Vital High Protein via OGT at 40 ml/h (960 ml/day) to provide 960 kcals (1095 total kcal with Propofol), 84 gm protein, 803 ml free water daily.  Patient is currently intubated on ventilator support MV: 7.5 L/min Temp (24hrs), Avg:98.9 F (37.2 C), Min:98.4 F (36.9 C), Max:99.6 F (37.6 C)  Propofol: 5.1 ml/hr providing 135 kcal from lipids Labs reviewed. Potassium 3.4 (L) CBG's: [phone] Medications reviewed and include Colace, Lasix, KCl.  Diet Order:  Diet NPO time specified  EDUCATION NEEDS:   No education needs have been identified at this time  Skin:  Skin Assessment: Reviewed RN Assessment  Last BM:  10/25  Height:   Ht Readings from Last 1 Encounters:  09/10/17 5\' 9"  (1.753 m)    Weight:   Wt Readings from Last 1 Encounters:  09/15/17 132 lb 11.5 oz (60.2 kg)    Ideal Body Weight:  65.9 kg  BMI:  Body mass index is 19.6 kg/m.  Estimated Nutritional Needs:   Kcal:  1525  Protein:  85-95 gm  Fluid:  1.5 L   Molli Barrows, RD, LDN, [place] Pager [phone] After Hours Pager [phone]

## 2017-09-16 ENCOUNTER — Encounter (HOSPITAL_COMMUNITY): Payer: Self-pay | Admitting: Radiology

## 2017-09-16 ENCOUNTER — Inpatient Hospital Stay (HOSPITAL_COMMUNITY): Payer: Self-pay

## 2017-09-16 LAB — MAGNESIUM: Magnesium: 2 mg/dL (ref 1.7–2.4)

## 2017-09-16 LAB — CBC WITH DIFFERENTIAL/PLATELET
BASOS ABS: 0 10*3/uL (ref 0.0–0.1)
Basophils Relative: 0 %
EOS PCT: 0 %
Eosinophils Absolute: 0 10*3/uL (ref 0.0–0.7)
HCT: 35.2 % — ABNORMAL LOW (ref 36.0–46.0)
HEMOGLOBIN: 11.1 g/dL — AB (ref 12.0–15.0)
LYMPHS ABS: 2 10*3/uL (ref 0.7–4.0)
LYMPHS PCT: 16 %
MCH: 28 pg (ref 26.0–34.0)
MCHC: 31.5 g/dL (ref 30.0–36.0)
MCV: 88.9 fL (ref 78.0–100.0)
Monocytes Absolute: 1.6 10*3/uL — ABNORMAL HIGH (ref 0.1–1.0)
Monocytes Relative: 13 %
NEUTROS ABS: 8.8 10*3/uL — AB (ref 1.7–7.7)
NEUTROS PCT: 71 %
PLATELETS: 248 10*3/uL (ref 150–400)
RBC: 3.96 MIL/uL (ref 3.87–5.11)
RDW: 14.6 % (ref 11.5–15.5)
WBC: 12.5 10*3/uL — AB (ref 4.0–10.5)

## 2017-09-16 LAB — GLUCOSE, CAPILLARY
GLUCOSE-CAPILLARY: 118 mg/dL — AB (ref 65–99)
GLUCOSE-CAPILLARY: 127 mg/dL — AB (ref 65–99)
GLUCOSE-CAPILLARY: 131 mg/dL — AB (ref 65–99)
GLUCOSE-CAPILLARY: 141 mg/dL — AB (ref 65–99)
Glucose-Capillary: 128 mg/dL — ABNORMAL HIGH (ref 65–99)
Glucose-Capillary: 141 mg/dL — ABNORMAL HIGH (ref 65–99)
Glucose-Capillary: 144 mg/dL — ABNORMAL HIGH (ref 65–99)

## 2017-09-16 LAB — BASIC METABOLIC PANEL
ANION GAP: 7 (ref 5–15)
BUN: 25 mg/dL — ABNORMAL HIGH (ref 6–20)
CHLORIDE: 95 mmol/L — AB (ref 101–111)
CO2: 36 mmol/L — ABNORMAL HIGH (ref 22–32)
Calcium: 8.7 mg/dL — ABNORMAL LOW (ref 8.9–10.3)
Creatinine, Ser: 0.58 mg/dL (ref 0.44–1.00)
GFR calc Af Amer: >60 mL/min (ref >60)
GLUCOSE: 115 mg/dL — AB (ref 65–99)
POTASSIUM: 4 mmol/L (ref 3.5–5.1)
Sodium: 138 mmol/L (ref 135–145)

## 2017-09-16 LAB — PHOSPHORUS: Phosphorus: 3.1 mg/dL (ref 2.5–4.6)

## 2017-09-16 LAB — TRIGLYCERIDES: TRIGLYCERIDES: 65 mg/dL (ref <150)

## 2017-09-16 MED ORDER — CLONAZEPAM 1 MG PO TABS
2.0000 mg | ORAL_TABLET | Freq: Three times a day (TID) | ORAL | Status: DC
  Administered 2017-09-16 – 2017-09-20 (×14): 2 mg
  Filled 2017-09-16 (×14): qty 2

## 2017-09-16 MED ORDER — VECURONIUM BROMIDE 10 MG IV SOLR
10.0000 mg | Freq: Once | INTRAVENOUS | Status: DC
  Filled 2017-09-16: qty 10

## 2017-09-16 MED ORDER — METOLAZONE 5 MG PO TABS
5.0000 mg | ORAL_TABLET | Freq: Every day | ORAL | Status: AC
  Administered 2017-09-16: 5 mg via ORAL
  Filled 2017-09-16: qty 1

## 2017-09-16 MED ORDER — FUROSEMIDE 10 MG/ML IJ SOLN
40.0000 mg | Freq: Four times a day (QID) | INTRAMUSCULAR | Status: DC
  Administered 2017-09-16 (×2): 40 mg via INTRAVENOUS
  Filled 2017-09-16 (×3): qty 4

## 2017-09-16 MED ORDER — ETOMIDATE 2 MG/ML IV SOLN
40.0000 mg | Freq: Once | INTRAVENOUS | Status: DC
  Filled 2017-09-16: qty 20

## 2017-09-16 MED ORDER — MIDAZOLAM HCL 2 MG/2ML IJ SOLN: 4.0000 mg | Freq: Once | INTRAMUSCULAR | Status: DC

## 2017-09-16 MED ORDER — POTASSIUM CHLORIDE 20 MEQ/15ML (10%) PO SOLN
40.0000 meq | Freq: Three times a day (TID) | ORAL | Status: AC
  Administered 2017-09-16 (×2): 40 meq
  Filled 2017-09-16 (×2): qty 30

## 2017-09-16 MED ORDER — FENTANYL CITRATE (PF) 100 MCG/2ML IJ SOLN: 200.0000 ug | Freq: Once | INTRAMUSCULAR | Status: DC

## 2017-09-16 MED ORDER — PROPOFOL 500 MG/50ML IV EMUL
5.0000 ug/kg/min | Freq: Once | INTRAVENOUS | Status: DC
  Filled 2017-09-16: qty 50

## 2017-09-16 NOTE — Clinical Social Work Note (Signed)
Clinical Social Work Assessment  Patient Details  Name: Caitlin [name] MRN: [id number] Date of Birth: [birth year]  Date of referral:  09/16/17               Reason for consult:  Facility Placement                Permission sought to share information with:  Case Manager Permission granted to share information::  Yes, Verbal Permission Granted  Name::     Glass blower/designer::     Relationship::     Contact Information:     Housing/Transportation Living arrangements for the past 2 months:  Single Family Home (alone. ) Source of Information:  Other (Comment Required) (pt's sister ) Patient Interpreter Needed:  None Criminal Activity/Legal Involvement Pertinent to Current Situation/Hospitalization:  No - Comment as needed Significant Relationships:  Adult Children, Other Family Members, Siblings Lives with:  Self Do you feel safe going back to the place where you live?  Yes Need for family participation in patient care:  Yes (Comment)  Care giving concerns:  Pt is currently intubated. CSW spoke with pt's sister Inez Catalina at bedside to gather information. At this time sister has not presented any concerns to CSW.    Social Worker assessment / plan:  CSW spoke with pt's sister Inez Catalina at bedside. During this time CSW was informed that pt is from home. Pt has support from son as well as other family members. CSW was informed that pt's son is from [place] and if out of state placement is needed then the facility in [place] might be an option.   Employment status:  Unemployed Forensic scientist:  Self Pay (Medicaid Pending) PT Recommendations:  Not assessed at this time Information / Referral to community resources:     Patient/Family's Response to care:  Pt's family is understanding and agreeable to CSW plan of care at this time.   Patient/Family's Understanding of and Emotional Response to Diagnosis, Current Treatment, and Prognosis:  No further questions or concerns  have been presented to CSW at this time.   Emotional Assessment Appearance:  Appears stated age Attitude/Demeanor/Rapport:    Affect (typically observed):    Orientation:   (pt is intubated, unable to assess. ) Alcohol / Substance use:  Not Applicable Psych involvement (Current and /or in the community):  No (Comment)  Discharge Needs  Concerns to be addressed:  Discharge Planning Concerns Readmission within the last 30 days:  No Current discharge risk:  None Barriers to Discharge:  No Barriers Identified   Wetzel Bjornstad, [place] 09/16/2017, 12:17 PM

## 2017-09-16 NOTE — Progress Notes (Signed)
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  She also has a 30-pack-year history of smoking and continues to smoke.  She been living with her sister and making progress for rehabilitation standpoint.  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  She took her rescue bronchodilators without much relief because of this she went to the emergency room.  She was given bronchodilator therapy, and sent home with a prescription of prednisone.  Her symptoms did not get better, she returned to the emergency room on the 24th with resting shortness of breath, and almost continuous use of her rescue bronchodilator without relief.  Her initial chest x-ray was without infiltrate or evidence of edema, she had no sick exposures, no fever or chills.  She was admitted with a working diagnosis of acute exacerbation chronic obstructive pulmonary disease, and treated appropriately with supplemental oxygen, inhaled bronchodilators, and systemic steroids.  On the evening of 10/25 she developed acute distress, was found by nursing staff tripod in bed.  Was treated with IV Lasix, pulse IV steroid, and placed on noninvasive positive pressure ventilation.  Throughout the course of the evening and going into the a.m. hours of 10/26.  Her work of breathing has remained quite elevated.  She has been on and off BiPAP, continues to have cough, continues to wheeze, has not been getting much relief from rescue bronchodilation.  Critical care was asked to  see because of work of breathing, noninvasive positive pressure ventilation requirements, and concern about clinical deterioration.   EVENTS 09/08/2017 - admit 09/10/17 - intubated  SUBJECTIVE/OVERNIGHT/INTERVAL HX 10/28 Unable to wean due to agitation and persistent bronchospasm. 10/29 agitation overnight, also with bradycardia down to 30s so precedex and propofol held. She was noted to have pauses on cardiac monitoring which may be consistent with heart block. 10/31 no acute events overnight. Patient sedated on fentanyl versed and propofol. 11/1 no acute events, remains sedated on fentanyl, versed, and propofol  VITAL SIGNS: BP 129/62   Pulse (!) 125   Temp 97.7 F (36.5 C) (Axillary)   Resp (!) 25   Ht 5\' 9"  (1.753 m)   Wt 136 lb 3.9 oz (61.8 kg)   LMP 09/09/2014 (Approximate)   SpO2 99%   BMI 20.12 kg/m   HEMODYNAMICS:    VENTILATOR SETTINGS: Vent Mode: CPAP;PSV FiO2 (%):  [40 %-50 %] 40 % Set Rate:  [12 bmp] 12 bmp PEEP:  [5 cmH20] 5 cmH20 Pressure Support:  [10 cmH20] 10 cmH20 Plateau Pressure:  [5 cmH20-22 cmH20] 11 cmH20  INTAKE / OUTPUT: I/O last 3 completed shifts: In: 3307.1 [I.V.:822.1; NG/GT:1425; IV Piggyback:1060] Out: 4098 [Urine:1850]   EXAM: General: MV female, rests in bed, wakes to name, sedated Neuro:  +opens eyes to name, not following commands this AM HEENT:  MMM, MV  Cardiovascular:  RRR, no m/r/g Lungs:  +expiratory wheezes, +prolonged expiratory phase Abdomen:  Soft, non-distended Musculoskeletal:  No acute deformity Skin:  Intact, MMM  LABS  PULMONARY  Recent Labs Lab 09/10/17 1446 09/13/17 1117 09/14/17 0425 09/15/17 0413 09/16/17 1191  PHART 7.236* 7.348* 7.380 7.417 7.456*  PCO2ART 68.0* 47.1 47.9 56.3* 52.0*  PO2ART 518.0* 59.0* 75.9* 108 130*  HCO3 28.9* 25.9 27.7 35.6* 36.1*  TCO2 31 27  --   --   --   O2SAT 100.0 88.0 95.8 97.8 98.8    CBC  Recent Labs Lab 09/14/17 0333 09/15/17 0505 09/16/17 0500  HGB  10.5* 11.0* 11.1*  HCT 32.8* 34.3* 35.2*  WBC 12.6* 12.2* 12.5*  PLT 190 233 248    COAGULATION No results for input(s): INR in the last 168 hours.  CARDIAC    Recent Labs Lab 09/13/17 0543  TROPONINI <0.03   No results for input(s): PROBNP in the last 168 hours.   CHEMISTRY  Recent Labs Lab 09/12/17 0210 09/13/17 0543 09/14/17 0333 09/15/17 0505 09/16/17 0500  NA 139 138 139 139 138  K 4.5 4.6 4.4 3.4* 4.0  CL 110 107 108 96* 95*  CO2 21* 24 28 35* 36*  GLUCOSE 125* 129* 103* 115* 115*  BUN 45* 33* 37* 23* 25*  CREATININE 0.97 0.67 0.58 0.59 0.58  CALCIUM 8.7* 8.7* 8.7* 8.6* 8.7*  MG 2.7* 2.5* 2.2 1.9 2.0  PHOS 3.8 3.3 2.3* 3.3 3.1   Estimated Creatinine Clearance: 79.3 mL/min (by C-G formula based on SCr of 0.58 mg/dL).  LIVER  Recent Labs Lab 09/10/17 1420  AST 23  ALT 18  ALKPHOS 60  BILITOT 0.8  PROT 8.1  ALBUMIN 4.5   INFECTIOUS  Recent Labs Lab 09/10/17 1201  PROCALCITON <0.10   ENDOCRINE CBG (last 3)   Recent Labs  09/16/17 0031 09/16/17 0436 09/16/17 0737  GLUCAP 141* 118* 128*   IMAGING x48h  - image(s) personally visualized  -   highlighted in bold Dg Chest Port 1 View  Result Date: 09/16/2017 CLINICAL DATA:  Intubated EXAM: PORTABLE CHEST 1 VIEW COMPARISON:  Chest radiograph from one day prior. FINDINGS: Endotracheal tube tip is 4.6 cm above the carina. Enteric tube enters stomach with the tip not seen on this image. Right internal jugular central venous catheter terminates in the middle third of the superior vena cava. Stable cardiomediastinal silhouette with normal heart size. No pneumothorax. No pleural effusion. Stable mild bibasilar atelectasis. No pulmonary edema. No acute consolidative airspace disease. IMPRESSION: 1. Well-positioned support structures.  No pneumothorax. 2. Stable mild bibasilar atelectasis. Electronically Signed   By: Ilona Sorrel M.D.   On: 09/16/2017 07:30   Dg Chest Port 1 View  Result Date:  09/15/2017 CLINICAL DATA:  Intubated patient EXAM: PORTABLE CHEST 1 VIEW COMPARISON:  09/14/2017 FINDINGS: Endotracheal tube and NG tube unchanged. RIGHT central venous line unchanged. Stable cardiac silhouette. No effusion, infiltrate or pneumothorax. IMPRESSION: 1. Stable support apparatus. 2. Lungs are clear. Electronically Signed   By: Suzy Bouchard M.D.   On: 09/15/2017 07:43   Dg Chest Port 1 View  Result Date: 09/14/2017 CLINICAL DATA:  Check central line placement EXAM: PORTABLE CHEST 1 VIEW COMPARISON:  09/14/2017 FINDINGS: Right jugular central line is now noted in the mid superior vena cava. No pneumothorax is noted. The lungs remain clear. Endotracheal tube and nasogastric catheter are again noted and stable. Previously seen left basilar atelectasis has resolved. Cardiac shadow is stable. Aortic calcifications are noted. IMPRESSION: No pneumothorax following right jugular line placement. Electronically Signed   By: Inez Catalina M.D.   On: 09/14/2017 11:15   ASSESSMENT and PLAN  PULM COPD with acute exacerbation - Full vent support - Wean as tolerated, limited by agitation -  Albuterol 2.5 mg nebs q4H PRN wheezing. Brovana BID, budesnoide BID, DUONEBs scheduled q6H - Solumedrol 40 mg q12H - Doxycycline 10/26 >  CARDIAC Chronic combined CHF - LVEF 55% appears euvolemic. Trop neg BNP 50.8>>201.1 ?Heart block - Holding home lisinopril, metoprolol given hypotension while sedated.  - Holding PRN metoprolol  RENAL AKI resolved - Replace electrolyte as indicated - Lasix 40 mg IV q6 x3 doses - Zaroxolyn 5 mg PO x1 - KVO IVF  GI Nutrition - TF - Protonix for SUP  NEURO Acute encephalopathy Agitation - Fentanyl, versed, propofol for sedation - Holding precedex for bradycardia - RASS goal -1 - Clonazepam 2 mg PO TID - Head CT today  HEME Leukocytosis, afebrile, likely steroid related.   - Monitor WBC/fevers. - Hold off abx  FAMILY  - Updates: 09/16/2017 -->  Sisters and son updated bedside.  - Inter-disciplinary family meet or Palliative Care meeting due by:  Day 7. Current LOS is LOS 7 days  Harriet Butte, [place], PGY-2  Attending Note:  51 year old female with COPD and CHF with VDRF and using high dose sedation.  On exam, diffuse crackles.  I reviewed CXR myself, ETT in place and hyperinflation noted.  Will start diureses.  Replace electrolytes.  Continue tele monitoring.  Clonazepam 2 mg PO TID with holding for sedation.  Abx completed.  Spoke with sister and son, will diurese today, continue abx, if not improving in AM then will proceed with trach in AM.  KVO IVF.   The patient is critically ill with multiple organ systems failure and requires high complexity decision making for assessment and support, frequent evaluation and titration of therapies, application of advanced monitoring technologies and extensive interpretation of multiple databases.   Critical Care Time devoted to patient care services described in this note is  35  Minutes. This time reflects time of care of this signee Dr Jennet Maduro. This critical care time does not reflect procedure time, or teaching time or supervisory time of PA/NP/Med student/Med Resident etc but could involve care discussion time.  [name], M.D. [facility] Pulmonary/Critical Care Medicine. Pager: [phone]. After hours pager: [phone].

## 2017-09-16 NOTE — Progress Notes (Signed)
PT Cancellation Note  Patient Details Name: Caitlin [name] MRN: [id number] DOB: June 13, 1966   Cancelled Treatment:    Reason Eval/Treat Not Completed: Medical issues which prohibited therapy Pt continues to be intubated and sedated and on bedrest. Will await appropriateness prior to PT evaluation.   Marguarite Arbour A Vanden Fawaz 09/16/2017, 8:31 AM Wray Kearns, PT, DPT [phone]

## 2017-09-16 NOTE — Progress Notes (Signed)
Transported PT to CT on vent.

## 2017-09-16 NOTE — Progress Notes (Signed)
CSW and RN CM spoke with pt's sister Inez Catalina at bedside to discuss the potential option of out of state placement fo rpt if pt is unable to wean from vent. Pt's sister is understanding of this and will speak with pt's son about placement options if out of state placement is needed. CSW continues to follow for discharge planning needs.    [name] Grecia Lynk, MSW, [place] [facility] Clinical Social Worker [phone]

## 2017-09-17 ENCOUNTER — Inpatient Hospital Stay (HOSPITAL_COMMUNITY): Payer: Self-pay

## 2017-09-17 DIAGNOSIS — I471 Supraventricular tachycardia: Secondary | ICD-10-CM

## 2017-09-17 LAB — GLUCOSE, CAPILLARY
GLUCOSE-CAPILLARY: 133 mg/dL — AB (ref 65–99)
GLUCOSE-CAPILLARY: 121 mg/dL — AB (ref 65–99)
GLUCOSE-CAPILLARY: 141 mg/dL — AB (ref 65–99)
GLUCOSE-CAPILLARY: 89 mg/dL (ref 65–99)
Glucose-Capillary: 109 mg/dL — ABNORMAL HIGH (ref 65–99)
Glucose-Capillary: 138 mg/dL — ABNORMAL HIGH (ref 65–99)

## 2017-09-17 LAB — BLOOD GAS, ARTERIAL
ACID-BASE EXCESS: 11.5 mmol/L — AB (ref 0.0–2.0)
ACID-BASE EXCESS: 11.1 mmol/L — AB (ref 0.0–2.0)
BICARBONATE: 36.1 mmol/L — AB (ref 20.0–28.0)
Bicarbonate: 35.8 mmol/L — ABNORMAL HIGH (ref 20.0–28.0)
Drawn by: 308601
Drawn by: 41977
FIO2: 40
FIO2: 40
LHR: 12 {breaths}/min
O2 SAT: 96.7 %
O2 Saturation: 98.8 %
PATIENT TEMPERATURE: 98.6
PCO2 ART: 52 mmHg — AB (ref 32.0–48.0)
PCO2 ART: 53.8 mmHg — AB (ref 32.0–48.0)
PEEP/CPAP: 5 cmH2O
PEEP: 5 cmH2O
PH ART: 7.438 (ref 7.350–7.450)
PO2 ART: 130 mmHg — AB (ref 83.0–108.0)
PO2 ART: 87.5 mmHg (ref 83.0–108.0)
PRESSURE CONTROL: 10 cmH2O
Patient temperature: 98.6
Pressure control: 10 cmH2O
RATE: 12 resp/min
pH, Arterial: 7.456 — ABNORMAL HIGH (ref 7.350–7.450)

## 2017-09-17 LAB — CBC
HCT: 36.8 % (ref 36.0–46.0)
HEMOGLOBIN: 11.8 g/dL — AB (ref 12.0–15.0)
MCH: 28.7 pg (ref 26.0–34.0)
MCHC: 32.1 g/dL (ref 30.0–36.0)
MCV: 89.5 fL (ref 78.0–100.0)
Platelets: 273 10*3/uL (ref 150–400)
RBC: 4.11 MIL/uL (ref 3.87–5.11)
RDW: 14.7 % (ref 11.5–15.5)
WBC: 15.3 10*3/uL — ABNORMAL HIGH (ref 4.0–10.5)

## 2017-09-17 LAB — MAGNESIUM: Magnesium: 2 mg/dL (ref 1.7–2.4)

## 2017-09-17 LAB — BASIC METABOLIC PANEL
Anion gap: 7 (ref 5–15)
BUN: 28 mg/dL — AB (ref 6–20)
CALCIUM: 8.8 mg/dL — AB (ref 8.9–10.3)
CHLORIDE: 96 mmol/L — AB (ref 101–111)
CO2: 35 mmol/L — AB (ref 22–32)
CREATININE: 0.59 mg/dL (ref 0.44–1.00)
GFR calc Af Amer: >60 mL/min (ref >60)
GFR calc non Af Amer: >60 mL/min (ref >60)
Glucose, Bld: 133 mg/dL — ABNORMAL HIGH (ref 65–99)
Potassium: 4.6 mmol/L (ref 3.5–5.1)
Sodium: 138 mmol/L (ref 135–145)

## 2017-09-17 LAB — PHOSPHORUS: Phosphorus: 3.3 mg/dL (ref 2.5–4.6)

## 2017-09-17 LAB — PROTIME-INR
INR: 0.92
Prothrombin Time: 12.3 seconds (ref 11.4–15.2)

## 2017-09-17 LAB — TRIGLYCERIDES: TRIGLYCERIDES: 119 mg/dL (ref <150)

## 2017-09-17 MED ORDER — ETOMIDATE 2 MG/ML IV SOLN
40.0000 mg | Freq: Once | INTRAVENOUS | Status: AC
  Administered 2017-09-17: 20 mg via INTRAVENOUS
  Filled 2017-09-17: qty 20

## 2017-09-17 MED ORDER — QUETIAPINE FUMARATE 100 MG PO TABS
100.0000 mg | ORAL_TABLET | Freq: Two times a day (BID) | ORAL | Status: DC
  Administered 2017-09-17 – 2017-09-22 (×11): 100 mg
  Filled 2017-09-17 (×13): qty 1

## 2017-09-17 MED ORDER — MIDAZOLAM HCL 2 MG/2ML IJ SOLN: 4.0000 mg | Freq: Once | INTRAMUSCULAR | Status: DC

## 2017-09-17 MED ORDER — VALPROATE SODIUM 500 MG/5ML IV SOLN
500.0000 mg | Freq: Two times a day (BID) | INTRAVENOUS | Status: DC
  Administered 2017-09-17 – 2017-09-21 (×10): 500 mg via INTRAVENOUS
  Filled 2017-09-17 (×11): qty 5

## 2017-09-17 MED ORDER — ADENOSINE 6 MG/2ML IV SOLN
6.0000 mg | Freq: Once | INTRAVENOUS | Status: AC
  Administered 2017-09-17: 6 mg via INTRAVENOUS

## 2017-09-17 MED ORDER — VECURONIUM BROMIDE 10 MG IV SOLR
10.0000 mg | Freq: Once | INTRAVENOUS | Status: DC
  Filled 2017-09-17: qty 10

## 2017-09-17 MED ORDER — ROCURONIUM BROMIDE 50 MG/5ML IV SOLN
50.0000 mg | Freq: Once | INTRAVENOUS | Status: AC
  Administered 2017-09-17: 50 mg via INTRAVENOUS
  Filled 2017-09-17: qty 5

## 2017-09-17 MED ORDER — SODIUM CHLORIDE 0.9 % IV BOLUS (SEPSIS)
500.0000 mL | Freq: Once | INTRAVENOUS | Status: AC
  Administered 2017-09-17: 500 mL via INTRAVENOUS

## 2017-09-17 MED ORDER — PROPOFOL 1000 MG/100ML IV EMUL
0.0000 ug/kg/min | INTRAVENOUS | Status: DC
  Administered 2017-09-17 (×2): 30 ug/kg/min via INTRAVENOUS
  Administered 2017-09-17: 10 ug/kg/min via INTRAVENOUS
  Administered 2017-09-18: 20 ug/kg/min via INTRAVENOUS
  Filled 2017-09-17 (×3): qty 100

## 2017-09-17 MED ORDER — POLYETHYLENE GLYCOL 3350 17 G PO PACK
17.0000 g | PACK | Freq: Two times a day (BID) | ORAL | Status: DC
  Administered 2017-09-17 – 2017-09-21 (×8): 17 g via ORAL
  Filled 2017-09-17 (×11): qty 1

## 2017-09-17 MED ORDER — FENTANYL CITRATE (PF) 2500 MCG/50ML IJ SOLN
0.0000 ug/h | Status: DC
  Administered 2017-09-17 – 2017-09-18 (×2): 400 ug/h via INTRAVENOUS
  Administered 2017-09-19: 300 ug/h via INTRAVENOUS
  Administered 2017-09-20: 250 ug/h via INTRAVENOUS
  Filled 2017-09-17 (×5): qty 100

## 2017-09-17 MED ORDER — ROCURONIUM BROMIDE 50 MG/5ML IV SOLN
50.0000 mg | Freq: Once | INTRAVENOUS | Status: AC
  Administered 2017-09-17: 50 mg via INTRAVENOUS

## 2017-09-17 MED ORDER — QUETIAPINE FUMARATE 100 MG PO TABS
100.0000 mg | ORAL_TABLET | Freq: Two times a day (BID) | ORAL | Status: DC
  Filled 2017-09-17: qty 1

## 2017-09-17 MED ORDER — ADENOSINE 6 MG/2ML IV SOLN
INTRAVENOUS | Status: AC
  Filled 2017-09-17: qty 2

## 2017-09-17 MED ORDER — PROPOFOL 500 MG/50ML IV EMUL
5.0000 ug/kg/min | Freq: Once | INTRAVENOUS | Status: DC
  Filled 2017-09-17: qty 50

## 2017-09-17 MED ORDER — SENNOSIDES 8.8 MG/5ML PO SYRP
5.0000 mL | ORAL_SOLUTION | Freq: Two times a day (BID) | ORAL | Status: DC
  Administered 2017-09-17 – 2017-09-21 (×8): 5 mL via ORAL
  Filled 2017-09-17 (×8): qty 5

## 2017-09-17 MED ORDER — SODIUM CHLORIDE 0.9 % IV SOLN
0.0000 ug/min | INTRAVENOUS | Status: DC
  Administered 2017-09-17: 30 ug/min via INTRAVENOUS
  Filled 2017-09-17: qty 1

## 2017-09-17 MED ORDER — FENTANYL CITRATE (PF) 100 MCG/2ML IJ SOLN: 200.0000 ug | Freq: Once | INTRAMUSCULAR | Status: DC

## 2017-09-17 MED ORDER — SODIUM CHLORIDE 0.9 % IV BOLUS (SEPSIS)
500.0000 mL | Freq: Once | INTRAVENOUS | Status: AC
Start: 2017-09-17 — End: 2017-09-17
  Administered 2017-09-17: 500 mL via INTRAVENOUS

## 2017-09-17 MED ORDER — FENTANYL 2500MCG IN NS 250ML (10MCG/ML) PREMIX INFUSION: 0.0000 ug/h | INTRAVENOUS | Status: DC

## 2017-09-17 MED ORDER — SODIUM CHLORIDE 0.9 % IV SOLN
0.0000 ug/min | INTRAVENOUS | Status: DC
  Administered 2017-09-17: 110 ug/min via INTRAVENOUS
  Administered 2017-09-17: 100 ug/min via INTRAVENOUS
  Administered 2017-09-18: 86 ug/min via INTRAVENOUS
  Administered 2017-09-18: 80 ug/min via INTRAVENOUS
  Administered 2017-09-18: 180 ug/min via INTRAVENOUS
  Administered 2017-09-18: 130 ug/min via INTRAVENOUS
  Filled 2017-09-17 (×6): qty 4

## 2017-09-17 MED ORDER — WHITE PETROLATUM EX OINT
TOPICAL_OINTMENT | CUTANEOUS | Status: AC
  Administered 2017-09-17: 1
  Filled 2017-09-17: qty 28.35

## 2017-09-17 MED ORDER — BISACODYL 10 MG RE SUPP
10.0000 mg | Freq: Once | RECTAL | Status: AC
  Administered 2017-09-17: 10 mg via RECTAL

## 2017-09-17 NOTE — Procedures (Signed)
Bronchoscopy  for Percutaneous  Tracheostomy  Name: Caitlin [name] MRN: [id number] DOB: 1966/04/07 Procedure: Bronchoscopy for Percutaneous Tracheostomy Indications: Diagnostic evaluation of the airways and for percutaneous tracheostomy In conjunction with: Dr. Nelda Marseille  Procedure Details Consent: Risks of procedure as well as the alternatives and risks of each were explained to the (patient/caregiver).  Consent for procedure obtained. Time Out: Verified patient identification, verified procedure, site/side was marked, verified correct patient position, special equipment/implants available, medications/allergies/relevent history reviewed, required imaging and test results available.  Performed  In preparation for procedure, patient was given 100% FiO2 and bronchoscope lubricated. Sedation: Benzodiazepines, Muscle relaxants and Etomidate  Airway entered and the trachea to level of carina and instruments were visualized.   Procedures performed: Endotracheal Tube retracted in 2 cm increments to 17 cm at lip. Cannulation of airway observed. Dilation observed. Placement of trachel tube  observed . No overt complications. Bronchoscope removed.    Evaluation Hemodynamic Status: BP stable throughout; O2 sats: stable throughout Patient's Current Condition: stable Specimens:  None Complications: No apparent complications Patient did tolerate procedure well.   Richardson Landry Minor ACNP Maryanna Shape PCCM Pager [phone] till 3 pm If no answer page [phone] 09/17/2017, 11:03 AM

## 2017-09-17 NOTE — Progress Notes (Signed)
[place] Progress Note Patient Name: Caitlin [name] DOB: [birth year] MRN: [id number]   Date of Service  09/17/2017  HPI/Events of Note  Borderline BP  Dc prop bolus  eICU Interventions       Intervention Category Intermediate Interventions: Hypotension - evaluation and management  Raylene Miyamoto. 09/17/2017, 12:31 AM

## 2017-09-17 NOTE — Progress Notes (Signed)
[place] Progress Note Patient Name: Caitlin [name] DOB: 1966-01-27 MRN: [id number]   Date of Service  09/17/2017  HPI/Events of Note  bp better  re add prop  eICU Interventions       Intervention Category Major Interventions: Change in mental status - evaluation and management  Raylene Miyamoto. 09/17/2017, 2:34 AM

## 2017-09-17 NOTE — Progress Notes (Signed)
CSW continues to follow for vent/snf placement for pt if unable to wean.    [name] Jaylanni Eltringham, MSW, [place] [facility] Clinical Social Worker [phone]

## 2017-09-17 NOTE — Care Management Note (Signed)
Case Management Note  Patient Details  Name: KELLIANNE EK MRN: [id number] Date of Birth: April 28, 1966  Subjective/Objective:    Pt admitted with SOB                Action/Plan:   PTA from home.  Per attending pt will likely need trach/snf at discharge.  Pt received trach but remains on ventilator at this time.  CSW following and will pursue trach/snf placement when appropriate.  Pt is not a LTACH candidate due to lack of benefits.  CM will continue to monitor for discharge needs    Expected Discharge Date:                  Expected Discharge Plan:  [place]  In-House Referral:     Discharge planning Services  CM Consult  Post Acute Care Choice:    Choice offered to:     DME Arranged:    DME Agency:     HH Arranged:    HH Agency:     Status of Service:  In process, will continue to follow  If discussed at Long Length of Stay Meetings, dates discussed:    Additional Comments:  Maryclare Labrador, RN 09/17/2017, 9:30 AM

## 2017-09-17 NOTE — Procedures (Signed)
Bedside Tracheostomy Insertion Procedure Note   Patient Details:   Name: Caitlin [name] DOB: [birth year] MRN: [id number]  Procedure: Tracheostomy  Pre Procedure Assessment: ET Tube Size:7.5 ET Tube secured at lip (cm):22 Bite block in place: No Breath Sounds: Clear  Post Procedure Assessment: BP 96/70   Pulse (!) 132   Temp (!) 97.1 F (36.2 C) (Oral)   Resp 12   Ht 5\' 9"  (1.753 m)   Wt 132 lb 11.5 oz (60.2 kg)   LMP 09/09/2014 (Approximate)   SpO2 100%   BMI 19.60 kg/m  O2 sats: stable throughout Complications: No apparent complications Patient did tolerate procedure well Tracheostomy Brand:Shiley Tracheostomy Style:Cuffed Tracheostomy Size: 6.0 Tracheostomy Secured VQQ:[id number] Tracheostomy Placement Confirmation:Trach cuff visualized and in place    Phillis Knack [facility] 09/17/2017, 9:54 AM

## 2017-09-17 NOTE — Progress Notes (Signed)
Cortrak Tube Team Note:  Consult received to place a Cortrak feeding tube.   A 10 F Cortrak tube was placed in theR nare and secured with a nasal bridle at 83 cm. Per the Cortrak monitor reading the tube tip is postpyloric, approximately D2.   No x-ray is required. RN may begin using tube.   If the tube becomes dislodged please keep the tube and contact the Cortrak team at [email or web address] (password [email or web address]) for replacement.  If after hours and replacement cannot be delayed, place a NG tube and confirm placement with an abdominal x-ray.   Burtis Junes RD, LDN, CNSC Clinical Nutrition Pager: [phone] 09/17/2017 4:42 PM

## 2017-09-17 NOTE — Progress Notes (Signed)
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  She also has a 30-pack-year history of smoking and continues to smoke.  She been living with her sister and making progress for rehabilitation standpoint.  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  She took her rescue bronchodilators without much relief because of this she went to the emergency room.  She was given bronchodilator therapy, and sent home with a prescription of prednisone.  Her symptoms did not get better, she returned to the emergency room on the 24th with resting shortness of breath, and almost continuous use of her rescue bronchodilator without relief.  Her initial chest x-ray was without infiltrate or evidence of edema, she had no sick exposures, no fever or chills.  She was admitted with a working diagnosis of acute exacerbation chronic obstructive pulmonary disease, and treated appropriately with supplemental oxygen, inhaled bronchodilators, and systemic steroids.  On the evening of 10/25 she developed acute distress, was found by nursing staff tripod in bed.  Was treated with IV Lasix, pulse IV steroid, and placed on noninvasive positive pressure ventilation.  Throughout the course of the evening and going into the a.m. hours of 10/26.  Her work of breathing has remained quite elevated.  She has been on and off BiPAP, continues to have cough, continues to wheeze, has not been getting much relief from rescue bronchodilation.  Critical care was asked to  see because of work of breathing, noninvasive positive pressure ventilation requirements, and concern about clinical deterioration.   EVENTS 10/24 admit 10/26 intubated 11/2 tracheotomy placed  SUBJECTIVE/OVERNIGHT/INTERVAL HX 10/28 Unable to wean due to agitation and persistent bronchospasm. 10/29 agitation overnight, also with bradycardia down to 30s so precedex and propofol held. She was noted to have pauses on cardiac monitoring which may be consistent with heart block. 10/31 no acute events overnight. Patient sedated on fentanyl versed and propofol. 11/1 no acute events, remains sedated on fentanyl, versed, and propofol 11/2 hypotension overnight improved with d/c prop and initiation of bolus followed by change in mentation improved with restarting prop. Tachycardic event resolved with advancement of ETT. Plan to trach. Improved aeration of R-lung base on CXR with stable tube placement, mild L-base atelectasis remains  VITAL SIGNS: BP (!) 154/92   Pulse (!) 117   Temp (!) 97.1 F (36.2 C) (Oral)   Resp (!) 22   Ht 5\' 9"  (1.753 m)   Wt 132 lb 11.5 oz (60.2 kg)   LMP 09/09/2014 (Approximate)   SpO2 100%   BMI 19.60 kg/m   HEMODYNAMICS:    VENTILATOR SETTINGS: Vent Mode: CPAP;PSV FiO2 (%):  [40 %] 40 % Set Rate:  [12 bmp] 12 bmp PEEP:  [5 cmH20] 5 cmH20 Pressure Support:  [5 cmH20] 5 cmH20 Plateau Pressure:  [9 cmH20-12 cmH20] 12 cmH20  INTAKE / OUTPUT: I/O last 3 completed shifts: In: 2960.8 [I.V.:810.8; NG/GT:1650; IV [id number]:235] Out: 2370 [Urine:2370]   EXAM: General: MV female, rests in bed, wakes to name, sedated Neuro:  +opens eyes to name, not following commands HEENT:  MMM, MV  Cardiovascular: tachycardic with regular rhythm, no m/r/g Lungs:  +expiratory wheezes, +prolonged expiratory phase Abdomen:  Soft, non-distended Musculoskeletal:  No acute deformity Skin:  Intact, MMM  LABS  PULMONARY  Recent Labs Lab 09/10/17 1446 09/13/17 1117  09/14/17 0425 09/15/17 0413 09/16/17 0415 09/17/17 0305  PHART 7.236* 7.348* 7.380 7.417 7.456* 7.438  PCO2ART 68.0* 47.1 47.9 56.3* 52.0* 53.8*  PO2ART 518.0* 59.0* 75.9* 108 130* 87.5  HCO3 28.9* 25.9 27.7 35.6* 36.1* 35.8*  TCO2 31 27  --   --   --   --   O2SAT 100.0 88.0 95.8 97.8 98.8 96.7    CBC  Recent Labs Lab 09/15/17 0505 09/16/17 0500 09/17/17 0328  HGB 11.0* 11.1* 11.8*  HCT 34.3* 35.2* 36.8  WBC 12.2* 12.5* 15.3*  PLT 233 248 273    COAGULATION No results for input(s): INR in the last 168 hours.  CARDIAC    Recent Labs Lab 09/13/17 0543  TROPONINI <0.03   No results for input(s): PROBNP in the last 168 hours.   CHEMISTRY  Recent Labs Lab 09/13/17 0543 09/14/17 0333 09/15/17 0505 09/16/17 0500 09/17/17 0328  NA 138 139 139 138 138  K 4.6 4.4 3.4* 4.0 4.6  CL 107 108 96* 95* 96*  CO2 24 28 35* 36* 35*  GLUCOSE 129* 103* 115* 115* 133*  BUN 33* 37* 23* 25* 28*  CREATININE 0.67 0.58 0.59 0.58 0.59  CALCIUM 8.7* 8.7* 8.6* 8.7* 8.8*  MG 2.5* 2.2 1.9 2.0 2.0  PHOS 3.3 2.3* 3.3 3.1 3.3   Estimated Creatinine Clearance: 77.3 mL/min (by C-G formula based on SCr of 0.59 mg/dL).  LIVER  Recent Labs Lab 09/10/17 1420  AST 23  ALT 18  ALKPHOS 60  BILITOT 0.8  PROT 8.1  ALBUMIN 4.5   INFECTIOUS  Recent Labs Lab 09/10/17 1201  PROCALCITON <0.10   ENDOCRINE CBG (last 3)   Recent Labs  09/16/17 2331 09/17/17 0322 09/17/17 0806  GLUCAP 144* 133* 121*   IMAGING x48h  - image(s) personally visualized  -   highlighted in bold Ct Head Wo Contrast  Result Date: 09/16/2017 CLINICAL DATA:  51 y/o  F; altered level of consciousness. EXAM: CT HEAD WITHOUT CONTRAST TECHNIQUE: Contiguous axial images were obtained from the base of the skull through the vertex without intravenous contrast. COMPARISON:  None. FINDINGS: Brain: No evidence of acute infarction, hemorrhage, hydrocephalus, extra-axial collection or mass lesion/mass effect.  Vascular: No hyperdense vessel or unexpected calcification. Skull: Normal. Negative for fracture or focal lesion. Sinuses/Orbits: No acute finding. Other: None. IMPRESSION: No acute intracranial abnormality identified. Unremarkable CT of the head. Electronically Signed   By: Kristine Garbe M.D.   On: 09/16/2017 17:09   Dg Chest Port 1 View  Result Date: 09/17/2017 CLINICAL DATA:  Intubation. EXAM: PORTABLE CHEST 1 VIEW COMPARISON:  09/16/2017. FINDINGS: Endotracheal tube, NG tube, right IJ line stable position. Heart size normal. Mild left base atelectasis. Improved aeration right lung base. No pleural effusion or pneumothorax. IMPRESSION: 1. Lines and tubes in stable position. 2. Mild left base atelectasis again noted. Improved aeration right lung base. Electronically Signed   By: Marcello Moores  Register   On: 09/17/2017 07:40   Dg Chest Port 1 View  Result Date: 09/16/2017 CLINICAL DATA:  Intubated EXAM: PORTABLE CHEST 1 VIEW COMPARISON:  Chest radiograph from one day prior. FINDINGS: Endotracheal tube tip is 4.6 cm above the carina. Enteric tube enters stomach with the tip not seen on this image. Right  internal jugular central venous catheter terminates in the middle third of the superior vena cava. Stable cardiomediastinal silhouette with normal heart size. No pneumothorax. No pleural effusion. Stable mild bibasilar atelectasis. No pulmonary edema. No acute consolidative airspace disease. IMPRESSION: 1. Well-positioned support structures.  No pneumothorax. 2. Stable mild bibasilar atelectasis. Electronically Signed   By: Ilona Sorrel M.D.   On: 09/16/2017 07:30   ASSESSMENT and PLAN  PULM COPD with acute exacerbation - Full vent support, plan to insert tracheostomy - Wean as tolerated, limited by agitation - Albuterol 2.5 mg nebs q4H PRN wheezing. Brovana BID, budesnoide BID, DUONEBs scheduled q6H - Solumedrol 40 mg q12H - Doxycycline 10/26 > - Daily ABG, pCO2 remains elevated at  53.8  CARDIAC Chronic combined CHF - LVEF 55% appears euvolemic. Trop neg BNP 50.8>>201.1 ?Heart block - Holding home lisinopril, metoprolol given hypotension while sedated.  - Holding PRN metoprolol - On Lovenox SQ, plan to hold 11/2 for trach placement  RENAL AKI resolved - Replace electrolyte as indicated - Lasix 40 mg IV q6 x3 doses - Zaroxolyn 5 mg PO x1 - KVO IVF  GI Nutrition - TF - Protonix for SUP  NEURO Acute encephalopathy Agitation - Fentanyl, versed, propofol for sedation, adding 50 mg rocuronium for trach placement  - Holding precedex for bradycardia - RASS goal -1 - Clonazepam 2 mg PO TID  HEME Leukocytosis, afebrile, likely steroid related.   - Monitor WBC/fevers  FAMILY  - Updates: 09/17/2017 --> Sisters and son updated bedside.  - Inter-disciplinary family meet or Palliative Care meeting due by:  Day 7. Current LOS is LOS 8 days  Harriet Butte, [place], PGY-2  Attending Note:  51 year old female with severe COPD and CHF who was intubated for COPD exacerbation.  Patient is extremely agitated this AM on exam.  Went in SVT due to airway compromise.  I reviewed CXR myself, ETT is in good position.  Hold lasix for now.  Neo if BP continues to drop.  May consider checking cortisol level and stress dose steroids if BP remains an issue.  Replace electrolytes.  SVT this AM that was treated with adenosine and securing airway.    The patient is critically ill with multiple organ systems failure and requires high complexity decision making for assessment and support, frequent evaluation and titration of therapies, application of advanced monitoring technologies and extensive interpretation of multiple databases.   Critical Care Time devoted to patient care services described in this note is  100  Minutes. This time reflects time of care of this signee Dr Jennet Maduro. This critical care time does not reflect procedure time, or teaching time or supervisory  time of PA/NP/Med student/Med Resident etc but could involve care discussion time.  [name], M.D. [facility] Pulmonary/Critical Care Medicine. Pager: [phone]. After hours pager: [phone].

## 2017-09-17 NOTE — Procedures (Signed)
Bronchoscopy Procedure Note Caitlin [name] [id number] [birth year]  Procedure: Bronchoscopy Indications: Diagnostic evaluation of the airways  Procedure Details Consent: Unable to obtain consent because of emergent medical necessity. Time Out: Verified patient identification, verified procedure, site/side was marked, verified correct patient position, special equipment/implants available, medications/allergies/relevent history reviewed, required imaging and test results available.  Performed  In preparation for procedure, patient was given 100% FiO2 and bronchoscope lubricated. Sedation: Benzodiazepines and Fentanyl and Propofol  Airway entered and the following bronchi were examined: RUL, RML, RLL, LUL, LLL and Bronchi.   ETT was above the airway and was advanced over the bronchoscope Bronchoscope removed.    Evaluation Hemodynamic Status: BP stable throughout; O2 sats: stable throughout Patient's Current Condition: stable Specimens:  None Complications: No apparent complications Patient did tolerate procedure well.   Caitlin [name] 09/17/2017

## 2017-09-17 NOTE — Progress Notes (Signed)
PT Cancellation Note  Patient Details Name: Caitlin [name] MRN: [id number] DOB: 1966-08-15   Cancelled Treatment:    Reason Eval/Treat Not Completed: Patient not medically ready. Pt remains sedated on vent and trached this AM. Will sign off now. Please re-order when pt more alert. Thanks.   [place] 09/17/2017, 11:30 AM  Suanne Marker PT [phone]

## 2017-09-17 NOTE — Procedures (Signed)
Percutaneous Tracheostomy Placement  Consent from family.  Patient sedated, paralyzed and position.  Placed on 100% FiO2 and RR matched.  Area cleaned and draped.  Lidocaine/epi injected.  Skin incision done followed by blunt dissection.  Trachea palpated then punctured, catheter passed and visualized bronchoscopically.  Wire placed and visualized.  Catheter removed.  Airway then entered and dilated.  Size 6 cuffed shiley trach placed and visualized bronchoscopically well above carina.  Good volume returns.  Patient tolerated the procedure well without complications.  Minimal blood loss.  CXR ordered and pending.  Wesam G. Yacoub, M.D. [place] Pulmonary/Critical Care Medicine. Pager: [phone]. After hours pager: [phone].  

## 2017-09-18 DIAGNOSIS — Z930 Tracheostomy status: Secondary | ICD-10-CM

## 2017-09-18 LAB — BLOOD GAS, ARTERIAL
Acid-Base Excess: 4 mmol/L — ABNORMAL HIGH (ref 0.0–2.0)
Bicarbonate: 26.8 mmol/L (ref 20.0–28.0)
Drawn by: 345601
FIO2: 40
LHR: 15 {breaths}/min
O2 SAT: 99.2 %
PEEP: 5 cmH2O
PH ART: 7.535 — AB (ref 7.350–7.450)
PRESSURE CONTROL: 30 cmH2O
Patient temperature: 98.6
pCO2 arterial: 31.8 mmHg — ABNORMAL LOW (ref 32.0–48.0)
pO2, Arterial: 180 mmHg — ABNORMAL HIGH (ref 83.0–108.0)

## 2017-09-18 LAB — GLUCOSE, CAPILLARY
GLUCOSE-CAPILLARY: 86 mg/dL (ref 65–99)
GLUCOSE-CAPILLARY: 102 mg/dL — AB (ref 65–99)
GLUCOSE-CAPILLARY: 116 mg/dL — AB (ref 65–99)
Glucose-Capillary: 109 mg/dL — ABNORMAL HIGH (ref 65–99)
Glucose-Capillary: 118 mg/dL — ABNORMAL HIGH (ref 65–99)
Glucose-Capillary: 86 mg/dL (ref 65–99)

## 2017-09-18 MED ORDER — SODIUM CHLORIDE 0.9 % IV BOLUS (SEPSIS)
1000.0000 mL | Freq: Once | INTRAVENOUS | Status: AC
  Administered 2017-09-18: 1000 mL via INTRAVENOUS

## 2017-09-18 MED ORDER — METOPROLOL TARTRATE 25 MG/10 ML ORAL SUSPENSION
12.5000 mg | Freq: Two times a day (BID) | ORAL | Status: DC
  Administered 2017-09-18 – 2017-09-21 (×5): 12.5 mg
  Filled 2017-09-18 (×9): qty 5

## 2017-09-18 NOTE — Progress Notes (Signed)
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  She also has a 30-pack-year history of smoking and continues to smoke.  She been living with her sister and making progress for rehabilitation standpoint.  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  She took her rescue bronchodilators without much relief because of this she went to the emergency room.  She was given bronchodilator therapy, and sent home with a prescription of prednisone.  Her symptoms did not get better, she returned to the emergency room on the 24th with resting shortness of breath, and almost continuous use of her rescue bronchodilator without relief.  Her initial chest x-ray was without infiltrate or evidence of edema, she had no sick exposures, no fever or chills.  She was admitted with a working diagnosis of acute exacerbation chronic obstructive pulmonary disease, and treated appropriately with supplemental oxygen, inhaled bronchodilators, and systemic steroids.  On the evening of 10/25 she developed acute distress, was found by nursing staff tripod in bed.  Was treated with IV Lasix, pulse IV steroid, and placed on noninvasive positive pressure ventilation.  Throughout the course of the evening and going into the a.m. hours of 10/26.  Her work of breathing has remained quite elevated.  She has been on and off BiPAP, continues to have cough, continues to wheeze, has not been getting much relief from rescue bronchodilation.  Critical care was asked to  see because of work of breathing, noninvasive positive pressure ventilation requirements, and concern about clinical deterioration.   EVENTS 10/24 admit 10/26 intubated 11/2 tracheotomy placed  SUBJECTIVE/OVERNIGHT/INTERVAL HX 10/28 Unable to wean due to agitation and persistent bronchospasm. 10/29 agitation overnight, also with bradycardia down to 30s so precedex and propofol held. She was noted to have pauses on cardiac monitoring which may be consistent with heart block. 10/31 no acute events overnight. Patient sedated on fentanyl versed and propofol. 11/1 no acute events, remains sedated on fentanyl, versed, and propofol 11/2 hypotension overnight improved with d/c prop and initiation of bolus followed by change in mentation improved with restarting prop. Tachycardic event resolved with advancement of ETT. Plan to trach. Improved aeration of R-lung base on CXR with stable tube placement, mild L-base atelectasis remains 11/3 failed weaning this AM due to lack of effort  VITAL SIGNS: BP 99/65   Pulse 96   Temp 98.3 F (36.8 C) (Oral)   Resp 15   Ht 5\' 9"  (1.753 m)   Wt 57.8 kg (127 lb 6.8 oz)   LMP 09/09/2014 (Approximate)   SpO2 100%   BMI 18.82 kg/m   HEMODYNAMICS:    VENTILATOR SETTINGS: Vent Mode: PCV FiO2 (%):  [40 %] 40 % Set Rate:  [15 bmp] 15 bmp Vt Set:  [30 mL] 30 mL PEEP:  [5 cmH20] 5 cmH20 Plateau Pressure:  [16 cmH20-21 cmH20] 21 cmH20  INTAKE / OUTPUT: I/O last 3 completed shifts: In: 3020.8 [I.V.:1865.8; NG/GT:550; IV [id number]:099] Out: 8338 [Urine:3345]   EXAM: General: MV female, rests in bed, wakes to name, sedated Neuro:  Completely unresponsive this  AM HEENT:  [place]/AT, PERRL, EOM-I and MMM Cardiovascular: tachycardic with regular rhythm, no m/r/g Lungs: Coarse BS diffusely Abdomen:  Soft, non-distended Musculoskeletal:  No acute deformity Skin:  Intact, MMM  LABS  PULMONARY  Recent Labs Lab 09/13/17 1117 09/14/17 0425  09/15/17 0413 09/16/17 0415 09/17/17 0305  PHART 7.348* 7.380 7.417 7.456* 7.438  PCO2ART 47.1 47.9 56.3* 52.0* 53.8*  PO2ART 59.0* 75.9* 108 130* 87.5  HCO3 25.9 27.7 35.6* 36.1* 35.8*  TCO2 27  --   --   --   --   O2SAT 88.0 95.8 97.8 98.8 96.7   CBC  Recent Labs Lab 09/15/17 0505 09/16/17 0500 09/17/17 0328  HGB 11.0* 11.1* 11.8*  HCT 34.3* 35.2* 36.8  WBC 12.2* 12.5* 15.3*  PLT 233 248 273   COAGULATION  Recent Labs Lab 09/17/17 0834  INR 0.92   CARDIAC  Recent Labs Lab 09/13/17 0543  TROPONINI <0.03   No results for input(s): PROBNP in the last 168 hours.  CHEMISTRY  Recent Labs Lab 09/13/17 0543 09/14/17 0333 09/15/17 0505 09/16/17 0500 09/17/17 0328  NA 138 139 139 138 138  K 4.6 4.4 3.4* 4.0 4.6  CL 107 108 96* 95* 96*  CO2 24 28 35* 36* 35*  GLUCOSE 129* 103* 115* 115* 133*  BUN 33* 37* 23* 25* 28*  CREATININE 0.67 0.58 0.59 0.58 0.59  CALCIUM 8.7* 8.7* 8.6* 8.7* 8.8*  MG 2.5* 2.2 1.9 2.0 2.0  PHOS 3.3 2.3* 3.3 3.1 3.3   Estimated Creatinine Clearance: 74.2 mL/min (by C-G formula based on SCr of 0.59 mg/dL).  LIVER  Recent Labs Lab 09/17/17 0834  INR 0.92   INFECTIOUS No results for input(s): LATICACIDVEN, PROCALCITON in the last 168 hours. ENDOCRINE CBG (last 3)   Recent Labs  09/17/17 2321 09/18/17 0321 09/18/17 0740  GLUCAP 138* 109* 86   IMAGING x48h  - image(s) personally visualized  -   highlighted in bold Ct Head Wo Contrast  Result Date: 09/16/2017 CLINICAL DATA:  51 y/o  F; altered level of consciousness. EXAM: CT HEAD WITHOUT CONTRAST TECHNIQUE: Contiguous axial images were obtained from the base of the skull through the vertex without intravenous contrast. COMPARISON:  None. FINDINGS: Brain: No evidence of acute infarction, hemorrhage, hydrocephalus, extra-axial collection or mass lesion/mass effect. Vascular: No hyperdense vessel or unexpected calcification. Skull: Normal. Negative for fracture or focal lesion.  Sinuses/Orbits: No acute finding. Other: None. IMPRESSION: No acute intracranial abnormality identified. Unremarkable CT of the head. Electronically Signed   By: Kristine Garbe M.D.   On: 09/16/2017 17:09   Dg Chest Port 1 View  Result Date: 09/17/2017 CLINICAL DATA:  Tracheostomy tube placement. EXAM: PORTABLE CHEST 1 VIEW COMPARISON:  09/17/2017 . FINDINGS: Interim removal endotracheal tube and NG tube. Interim placement of tracheostomy tube. Tracheostomy tube in good anatomic position . Right IJ line in stable position. Mediastinum hilar structures are normal. Heart size normal. Low lung volumes with mild bibasilar atelectasis/infiltrates. COPD. No pleural effusion or pneumothorax. IMPRESSION: 1. Interval removal of endotracheal tube. Interim removal of NG tube. Interim placement of tracheostomy tube. Tracheostomy tube in good anatomic position. Right IJ line in stable position. 2. Low lung volumes with mild bibasilar atelectasis/infiltrates . COPD. Electronically Signed   By: Marcello Moores  Register   On: 09/17/2017 11:29   Dg Chest Port 1 View  Result Date: 09/17/2017 CLINICAL DATA:  Intubation. EXAM: PORTABLE CHEST 1 VIEW COMPARISON:  09/16/2017. FINDINGS: Endotracheal tube, NG tube, right IJ line stable position. Heart size  normal. Mild left base atelectasis. Improved aeration right lung base. No pleural effusion or pneumothorax. IMPRESSION: 1. Lines and tubes in stable position. 2. Mild left base atelectasis again noted. Improved aeration right lung base. Electronically Signed   By: Marcello Moores  Register   On: 09/17/2017 07:40   ASSESSMENT and PLAN  PULM COPD with acute exacerbation - Will wake up to attempt PS today - Wean as tolerated, limited by agitation - Albuterol 2.5 mg nebs q4H PRN wheezing. Brovana BID, budesnoide BID, DUONEBs scheduled q6H - Continue Solumedrol 40 mg q12H - Doxycycline 10/26 >10/31  CARDIAC Chronic combined CHF - LVEF 55% appears euvolemic. Trop neg BNP  50.8>>201.1 ?Heart block - Restart home lopressor - Hold home lisinopril  - On Lovenox SQ prophylaxis dose  RENAL AKI resolved - Replace electrolyte as indicated - Hold lasix for today - KVO IVF  GI Nutrition - TF - Protonix for SUP  NEURO Acute encephalopathy Agitation - D/C propofol - Versed/fentanyl drips - No precedex for bradycardia - RASS goal -1 - Clonazepam 2 mg PO TID - Seroquel 100 BID  HEME Leukocytosis, afebrile, likely steroid related.   - Monitor WBC/fevers  FAMILY  - Updates: 09/18/2017 --> No family bedside - Inter-disciplinary family meet or Palliative Care meeting due by:  Day 7. Current LOS is LOS 9 days  The patient is critically ill with multiple organ systems failure and requires high complexity decision making for assessment and support, frequent evaluation and titration of therapies, application of advanced monitoring technologies and extensive interpretation of multiple databases.   Critical Care Time devoted to patient care services described in this note is  100  Minutes. This time reflects time of care of this signee Dr Jennet Maduro. This critical care time does not reflect procedure time, or teaching time or supervisory time of PA/NP/Med student/Med Resident etc but could involve care discussion time.  [name], M.D. [facility] Pulmonary/Critical Care Medicine. Pager: [phone]. After hours pager: [phone].

## 2017-09-18 NOTE — Progress Notes (Signed)
Noted SVT 240's on monitor while in patient room, suctioned patients mouth for vagal response hr down to 160bmp's. Fentanyl and versed bolus given, hr now 101.

## 2017-09-18 NOTE — Progress Notes (Signed)
[facility] Progress Note Patient Name: Caitlin [name] DOB: 02-21-1966 MRN: [id number]   Date of Service  09/18/2017  HPI/Events of Note  Hypotension - BP = 65/45. Last LVEF = 55-60%, Hgb = 11.8. Phenylephrine IV infusion has been titrated up and sedation has been temporarily held.   eICU Interventions  Will order: 1. Monitor CVP. 2. Bolus with 0.9 NaCl 1 liter IV over 1 hour now.  3. ABG now.      Intervention Category Major Interventions: Hypotension - evaluation and management  Sommer,Steven Eugene 09/18/2017, 10:23 PM

## 2017-09-18 NOTE — Progress Notes (Signed)
[facility] Progress Note Patient Name: Caitlin [name] DOB: [birth year] MRN: [id number]   Date of Service  09/18/2017  HPI/Events of Note  Multiple issues: 1. ABG on 40%/PC 30/Rate 30/P 5 = 7.53/31.8/180/26.0 and 2. Hypotension - CVP = 6 BP now = 98/63. Presently still getting the 1 liter 0.9 NaCl bolus ordered earlier.    eICU Interventions  Will order: 1. Decrease PC rate to 12.  2. ABG in AM.     Intervention Category Major Interventions: Hypotension - evaluation and management  Rhapsody Wolven Eugene 09/18/2017, 11:07 PM

## 2017-09-19 LAB — CBC
HCT: 31.6 % — ABNORMAL LOW (ref 36.0–46.0)
HEMOGLOBIN: 10.3 g/dL — AB (ref 12.0–15.0)
MCH: 28.5 pg (ref 26.0–34.0)
MCHC: 32.6 g/dL (ref 30.0–36.0)
MCV: 87.3 fL (ref 78.0–100.0)
PLATELETS: 226 10*3/uL (ref 150–400)
RBC: 3.62 MIL/uL — ABNORMAL LOW (ref 3.87–5.11)
RDW: 14.4 % (ref 11.5–15.5)
WBC: 18.7 10*3/uL — ABNORMAL HIGH (ref 4.0–10.5)

## 2017-09-19 LAB — BLOOD GAS, ARTERIAL
ACID-BASE EXCESS: 4.5 mmol/L — AB (ref 0.0–2.0)
Bicarbonate: 27.5 mmol/L (ref 20.0–28.0)
DRAWN BY: 345601
FIO2: 40
O2 SAT: 98.7 %
PEEP/CPAP: 5 cmH2O
PH ART: 7.509 — AB (ref 7.350–7.450)
PO2 ART: 143 mmHg — AB (ref 83.0–108.0)
PRESSURE CONTROL: 30 cmH2O
Patient temperature: 99.1
RATE: 12 resp/min
pCO2 arterial: 35 mmHg (ref 32.0–48.0)

## 2017-09-19 LAB — BASIC METABOLIC PANEL
ANION GAP: 7 (ref 5–15)
ANION GAP: 7 (ref 5–15)
BUN: 17 mg/dL (ref 6–20)
BUN: 15 mg/dL (ref 6–20)
CALCIUM: 8.6 mg/dL — AB (ref 8.9–10.3)
CHLORIDE: 103 mmol/L (ref 101–111)
CO2: 29 mmol/L (ref 22–32)
CO2: 28 mmol/L (ref 22–32)
CREATININE: 0.48 mg/dL (ref 0.44–1.00)
Calcium: 8.8 mg/dL — ABNORMAL LOW (ref 8.9–10.3)
Chloride: 103 mmol/L (ref 101–111)
Creatinine, Ser: 0.52 mg/dL (ref 0.44–1.00)
GFR calc Af Amer: >60 mL/min (ref >60)
GFR calc non Af Amer: >60 mL/min (ref >60)
GLUCOSE: 95 mg/dL (ref 65–99)
GLUCOSE: 101 mg/dL — AB (ref 65–99)
Potassium: 2.9 mmol/L — ABNORMAL LOW (ref 3.5–5.1)
Potassium: 3.7 mmol/L (ref 3.5–5.1)
Sodium: 139 mmol/L (ref 135–145)
Sodium: 138 mmol/L (ref 135–145)

## 2017-09-19 LAB — GLUCOSE, CAPILLARY
GLUCOSE-CAPILLARY: 128 mg/dL — AB (ref 65–99)
GLUCOSE-CAPILLARY: 139 mg/dL — AB (ref 65–99)
GLUCOSE-CAPILLARY: 96 mg/dL (ref 65–99)
Glucose-Capillary: 105 mg/dL — ABNORMAL HIGH (ref 65–99)
Glucose-Capillary: 118 mg/dL — ABNORMAL HIGH (ref 65–99)
Glucose-Capillary: 105 mg/dL — ABNORMAL HIGH (ref 65–99)

## 2017-09-19 LAB — PHOSPHORUS: PHOSPHORUS: 3.1 mg/dL (ref 2.5–4.6)

## 2017-09-19 LAB — TRIGLYCERIDES: TRIGLYCERIDES: 101 mg/dL (ref <150)

## 2017-09-19 LAB — MAGNESIUM: MAGNESIUM: 1.8 mg/dL (ref 1.7–2.4)

## 2017-09-19 MED ORDER — METHADONE HCL 10 MG PO TABS
5.0000 mg | ORAL_TABLET | Freq: Three times a day (TID) | ORAL | Status: DC
  Administered 2017-09-19 – 2017-09-20 (×4): 5 mg via ORAL
  Filled 2017-09-19 (×4): qty 1

## 2017-09-19 MED ORDER — POTASSIUM CHLORIDE 10 MEQ/50ML IV SOLN
10.0000 meq | INTRAVENOUS | Status: AC
  Administered 2017-09-19 (×8): 10 meq via INTRAVENOUS
  Filled 2017-09-19 (×9): qty 50

## 2017-09-19 MED ORDER — METHADONE 0.4 MG/ML ORAL SOLUTION: 5.0000 mg | Freq: Three times a day (TID) | ORAL | Status: DC

## 2017-09-19 NOTE — Progress Notes (Signed)
RT placed patient back on ventilator full support for the night. Patient tolerating well at this time. RT will continue to monitor.

## 2017-09-19 NOTE — Progress Notes (Signed)
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  She also has a 30-pack-year history of smoking and continues to smoke.  She been living with her sister and making progress for rehabilitation standpoint.  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  She took her rescue bronchodilators without much relief because of this she went to the emergency room.  She was given bronchodilator therapy, and sent home with a prescription of prednisone.  Her symptoms did not get better, she returned to the emergency room on the 24th with resting shortness of breath, and almost continuous use of her rescue bronchodilator without relief.  Her initial chest x-ray was without infiltrate or evidence of edema, she had no sick exposures, no fever or chills.  She was admitted with a working diagnosis of acute exacerbation chronic obstructive pulmonary disease, and treated appropriately with supplemental oxygen, inhaled bronchodilators, and systemic steroids.  On the evening of 10/25 she developed acute distress, was found by nursing staff tripod in bed.  Was treated with IV Lasix, pulse IV steroid, and placed on noninvasive positive pressure ventilation.  Throughout the course of the evening and going into the a.m. hours of 10/26.  Her work of breathing has remained quite elevated.  She has been on and off BiPAP, continues to have cough, continues to wheeze, has not been getting much relief from rescue bronchodilation.  Critical care was asked to  see because of work of breathing, noninvasive positive pressure ventilation requirements, and concern about clinical deterioration.   EVENTS 10/24 admit 10/26 intubated 11/2 tracheotomy placed  SUBJECTIVE/OVERNIGHT/INTERVAL HX 10/28 Unable to wean due to agitation and persistent bronchospasm. 10/29 agitation overnight, also with bradycardia down to 30s so precedex and propofol held. She was noted to have pauses on cardiac monitoring which may be consistent with heart block. 10/31 no acute events overnight. Patient sedated on fentanyl versed and propofol. 11/1 no acute events, remains sedated on fentanyl, versed, and propofol 11/2 hypotension overnight improved with d/c prop and initiation of bolus followed by change in mentation improved with restarting prop. Tachycardic event resolved with advancement of ETT. Plan to trach. Improved aeration of R-lung base on CXR with stable tube placement, mild L-base atelectasis remains 11/3 failed weaning this AM due to lack of effort 11/4 TC x15 minutes so far, no events overnight  VITAL SIGNS: BP 128/86   Pulse (!) 132   Temp 99.7 F (37.6 C) (Oral)   Resp (!) 25   Ht 5\' 9"  (1.753 m)   Wt 58.8 kg (129 lb 10.1 oz)   LMP 09/09/2014 (Approximate)   SpO2 96%   BMI 19.14 kg/m   HEMODYNAMICS: CVP:  [3 mmHg-6 mmHg] 3 mmHg  VENTILATOR SETTINGS: Vent Mode: PCV FiO2 (%):  [28 %-40 %] 28 % Set Rate:  [12 bmp-15 bmp] 12 bmp PEEP:  [5 cmH20] 5 cmH20 Plateau Pressure:  [18 cmH20-28 cmH20] 26 cmH20  INTAKE / OUTPUT: I/O last 3 completed shifts: In: 2722.3 [I.V.:1862.3; NG/GT:705; IV Piggyback:155] Out: 2979 [Urine:4225]   EXAM: General: MV female, rests  in bed, wakes to name, lethargic Neuro:  Wakes up to name, moving all ext spontaneously HEENT:  [place]/AT, PERRL, EOM-I and MMM Cardiovascular: tachycardic with regular rhythm, no m/r/g Lungs: Coarse BS diffusely Abdomen:  Soft, NT, ND and +BS Musculoskeletal:  No acute deformity Skin:   Intact, MMM  LABS  PULMONARY Recent Labs  Lab 09/13/17 1117  09/15/17 0413 09/16/17 0415 09/17/17 0305 09/18/17 2230 09/19/17 0305  PHART 7.348*   < > 7.417 7.456* 7.438 7.535* 7.509*  PCO2ART 47.1   < > 56.3* 52.0* 53.8* 31.8* 35.0  PO2ART 59.0*   < > 108 130* 87.5 180* 143*  HCO3 25.9   < > 35.6* 36.1* 35.8* 26.8 27.5  TCO2 27  --   --   --   --   --   --   O2SAT 88.0   < > 97.8 98.8 96.7 99.2 98.7   < > = values in this interval not displayed.   CBC Recent Labs  Lab 09/16/17 0500 09/17/17 0328 09/19/17 0440  HGB 11.1* 11.8* 10.3*  HCT 35.2* 36.8 31.6*  WBC 12.5* 15.3* 18.7*  PLT 248 273 226   COAGULATION Recent Labs  Lab 09/17/17 0834  INR 0.92   CARDIAC Recent Labs  Lab 09/13/17 0543  TROPONINI <0.03   No results for input(s): PROBNP in the last 168 hours.  CHEMISTRY Recent Labs  Lab 09/14/17 0333 09/15/17 0505 09/16/17 0500 09/17/17 0328 09/19/17 0440  NA 139 139 138 138 139  K 4.4 3.4* 4.0 4.6 2.9*  CL 108 96* 95* 96* 103  CO2 28 35* 36* 35* 29  GLUCOSE 103* 115* 115* 133* 95  BUN 37* 23* 25* 28* 17  CREATININE 0.58 0.59 0.58 0.59 0.48  CALCIUM 8.7* 8.6* 8.7* 8.8* 8.6*  MG 2.2 1.9 2.0 2.0 1.8  PHOS 2.3* 3.3 3.1 3.3 3.1   Estimated Creatinine Clearance: 75.5 mL/min (by C-G formula based on SCr of 0.48 mg/dL).  LIVER Recent Labs  Lab 09/17/17 0834  INR 0.92   INFECTIOUS No results for input(s): LATICACIDVEN, PROCALCITON in the last 168 hours. ENDOCRINE CBG (last 3)  Recent Labs    09/18/17 2347 09/19/17 0430 09/19/17 0729  GLUCAP 118* 105* 118*   IMAGING x48h  - image(s) personally visualized  -   highlighted in bold Dg Chest Port 1 View  Result Date: 09/17/2017 CLINICAL DATA:  Tracheostomy tube placement. EXAM: PORTABLE CHEST 1 VIEW COMPARISON:  09/17/2017 . FINDINGS: Interim removal endotracheal tube and NG tube. Interim placement of tracheostomy tube. Tracheostomy tube in good anatomic position . Right IJ line in stable  position. Mediastinum hilar structures are normal. Heart size normal. Low lung volumes with mild bibasilar atelectasis/infiltrates. COPD. No pleural effusion or pneumothorax. IMPRESSION: 1. Interval removal of endotracheal tube. Interim removal of NG tube. Interim placement of tracheostomy tube. Tracheostomy tube in good anatomic position. Right IJ line in stable position. 2. Low lung volumes with mild bibasilar atelectasis/infiltrates . COPD. Electronically Signed   By: Marcello Moores  Register   On: 09/17/2017 11:29   ASSESSMENT and PLAN  PULM COPD with acute exacerbation - TC as tolerated - Vent at night - Albuterol 2.5 mg nebs q4H PRN wheezing. Brovana BID, budesnoide BID, DUONEBs scheduled q6H - Continue Solumedrol 40 mg q12H - Doxycycline 10/26 >10/31  CARDIAC Chronic combined CHF - LVEF 55% appears euvolemic. Trop neg BNP 50.8>>201.1 ?Heart block - Restart home lopressor - Hold home lisinopril  - On Lovenox SQ prophylaxis dose  RENAL AKI resolved -  Replace electrolyte as indicated - Hold lasix for today - KVO IVF  GI Nutrition - TF - Protonix for SUP  NEURO Acute encephalopathy Agitation - D/C propofol - Versed/fentanyl drips needed even while on TC - No precedex for bradycardia - RASS goal -1 - Clonazepam 2 mg PO TID - Seroquel 100 BID - Will check QT interval and if normal will start methadone, sedation remains a major issue here  HEME Leukocytosis, afebrile, likely steroid related.   - Monitor WBC/fevers  FAMILY  - Updates: 09/19/2017 --> No family bedside - Inter-disciplinary family meet or Palliative Care meeting due by:  Day 7. Current LOS is LOS 10 days  The patient is critically ill with multiple organ systems failure and requires high complexity decision making for assessment and support, frequent evaluation and titration of therapies, application of advanced monitoring technologies and extensive interpretation of multiple databases.   Critical Care Time  devoted to patient care services described in this note is  100  Minutes. This time reflects time of care of this signee Dr Jennet Maduro. This critical care time does not reflect procedure time, or teaching time or supervisory time of PA/NP/Med student/Med Resident etc but could involve care discussion time.  [name], M.D. [facility] Pulmonary/Critical Care Medicine. Pager: [phone]. After hours pager: [phone].

## 2017-09-19 NOTE — Progress Notes (Signed)
Patient placed on 28% trach collar.  Currently tolerating well with sats of 97% and vitals stable.  Will continue to monitor.

## 2017-09-19 NOTE — Progress Notes (Signed)
[facility] ADULT ICU REPLACEMENT PROTOCOL FOR AM LAB REPLACEMENT ONLY  The patient does apply for the [facility] Adult ICU Electrolyte Replacment Protocol based on the criteria listed below:   1. Is GFR >/= 40 ml/min? Yes.    Patient's GFR today is >60 2. Is urine output >/= 0.5 ml/kg/hr for the last 6 hours? Yes.   Patient's UOP is 1.0 ml/kg/hr 3. Is BUN < 60 mg/dL? Yes.    Patient's BUN today is 17 4. Abnormal electrolyte(s): K 2.99 5. Ordered repletion with: protocol 6. If a panic level lab has been reported, has the CCM MD in charge been notified? No..   Physician:    Ronda Fairly A 09/19/2017 5:27 AM

## 2017-09-20 LAB — PHOSPHORUS: Phosphorus: 3.9 mg/dL (ref 2.5–4.6)

## 2017-09-20 LAB — GLUCOSE, CAPILLARY
GLUCOSE-CAPILLARY: 109 mg/dL — AB (ref 65–99)
GLUCOSE-CAPILLARY: 105 mg/dL — AB (ref 65–99)
Glucose-Capillary: 117 mg/dL — ABNORMAL HIGH (ref 65–99)
Glucose-Capillary: 126 mg/dL — ABNORMAL HIGH (ref 65–99)
Glucose-Capillary: 94 mg/dL (ref 65–99)

## 2017-09-20 LAB — BASIC METABOLIC PANEL
ANION GAP: 10 (ref 5–15)
BUN: 21 mg/dL — AB (ref 6–20)
CALCIUM: 8.7 mg/dL — AB (ref 8.9–10.3)
CO2: 28 mmol/L (ref 22–32)
CREATININE: 0.52 mg/dL (ref 0.44–1.00)
Chloride: 100 mmol/L — ABNORMAL LOW (ref 101–111)
Glucose, Bld: 99 mg/dL (ref 65–99)
Potassium: 4 mmol/L (ref 3.5–5.1)
SODIUM: 138 mmol/L (ref 135–145)

## 2017-09-20 LAB — CBC
HCT: 32.3 % — ABNORMAL LOW (ref 36.0–46.0)
Hemoglobin: 10.8 g/dL — ABNORMAL LOW (ref 12.0–15.0)
MCH: 29.3 pg (ref 26.0–34.0)
MCHC: 33.4 g/dL (ref 30.0–36.0)
MCV: 87.8 fL (ref 78.0–100.0)
PLATELETS: 223 10*3/uL (ref 150–400)
RBC: 3.68 MIL/uL — AB (ref 3.87–5.11)
RDW: 14.9 % (ref 11.5–15.5)
WBC: 18.9 10*3/uL — AB (ref 4.0–10.5)

## 2017-09-20 LAB — MAGNESIUM: MAGNESIUM: 2 mg/dL (ref 1.7–2.4)

## 2017-09-20 LAB — TSH: TSH: 0.158 u[IU]/mL — AB (ref 0.350–4.500)

## 2017-09-20 LAB — T4, FREE: Free T4: 1.18 ng/dL — ABNORMAL HIGH (ref 0.61–1.12)

## 2017-09-20 LAB — VITAMIN B12: Vitamin B-12: 881 pg/mL (ref 180–914)

## 2017-09-20 LAB — TRIGLYCERIDES: Triglycerides: 100 mg/dL (ref <150)

## 2017-09-20 MED ORDER — HALOPERIDOL LACTATE 5 MG/ML IJ SOLN
5.0000 mg | Freq: Four times a day (QID) | INTRAMUSCULAR | Status: DC
  Administered 2017-09-20 – 2017-09-24 (×13): 5 mg via INTRAVENOUS
  Filled 2017-09-20 (×17): qty 1

## 2017-09-20 MED ORDER — FENTANYL CITRATE (PF) 100 MCG/2ML IJ SOLN
25.0000 ug | INTRAMUSCULAR | Status: DC | PRN
  Administered 2017-09-20: 100 ug via INTRAVENOUS

## 2017-09-20 MED ORDER — HALOPERIDOL LACTATE 5 MG/ML IJ SOLN: 5.0000 mg | Freq: Four times a day (QID) | INTRAMUSCULAR | Status: DC | PRN

## 2017-09-20 MED ORDER — THIAMINE HCL 100 MG/ML IJ SOLN
100.0000 mg | Freq: Every day | INTRAMUSCULAR | Status: DC
  Administered 2017-09-20 – 2017-09-22 (×3): 100 mg via INTRAVENOUS
  Filled 2017-09-20 (×3): qty 1
  Filled 2017-09-20: qty 2

## 2017-09-20 MED ORDER — NICOTINE 7 MG/24HR TD PT24
7.0000 mg | MEDICATED_PATCH | Freq: Every day | TRANSDERMAL | Status: DC
  Administered 2017-09-20 – 2017-09-28 (×8): 7 mg via TRANSDERMAL
  Filled 2017-09-20 (×9): qty 1

## 2017-09-20 MED ORDER — PREDNISONE 5 MG/ML PO CONC
40.0000 mg | Freq: Every day | ORAL | Status: DC
  Administered 2017-09-20 – 2017-09-21 (×2): 40 mg
  Filled 2017-09-20 (×3): qty 8

## 2017-09-20 NOTE — Progress Notes (Signed)
Patient placed on 28% trach collar.  Currently tolerating well with sats of 98% and stable vitals.  Will continue to monitor.

## 2017-09-20 NOTE — Progress Notes (Signed)
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  She also has a 30-pack-year history of smoking and continues to smoke.  She been living with her sister and making progress for rehabilitation standpoint.  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  She took her rescue bronchodilators without much relief because of this she went to the emergency room.  She was given bronchodilator therapy, and sent home with a prescription of prednisone.  Her symptoms did not get better, she returned to the emergency room on the 24th with resting shortness of breath, and almost continuous use of her rescue bronchodilator without relief.  Her initial chest x-ray was without infiltrate or evidence of edema, she had no sick exposures, no fever or chills.  She was admitted with a working diagnosis of acute exacerbation chronic obstructive pulmonary disease, and treated appropriately with supplemental oxygen, inhaled bronchodilators, and systemic steroids.  On the evening of 10/25 she developed acute distress, was found by nursing staff tripod in bed.  Was treated with IV Lasix, pulse IV steroid, and placed on noninvasive positive pressure ventilation.  Throughout the course of the evening and going into the a.m. hours of 10/26.  Her work of breathing has remained quite elevated.  She has been on and off BiPAP, continues to have cough, continues to wheeze, has not been getting much relief from rescue bronchodilation.  Critical care was asked to  see because of work of breathing, noninvasive positive pressure ventilation requirements, and concern about clinical deterioration.   EVENTS 10/24 admit 10/26 intubated 11/2 tracheotomy placed  SUBJECTIVE/OVERNIGHT/INTERVAL HX 10/28 Unable to wean due to agitation and persistent bronchospasm. 10/29 agitation overnight, also with bradycardia down to 30s so precedex and propofol held. She was noted to have pauses on cardiac monitoring which may be consistent with heart block. 10/31 no acute events overnight. Patient sedated on fentanyl versed and propofol. 11/1 no acute events, remains sedated on fentanyl, versed, and propofol 11/2 hypotension overnight improved with d/c prop and initiation of bolus followed by change in mentation improved with restarting prop. Tachycardic event resolved with advancement of ETT. Plan to trach. Improved aeration of R-lung base on CXR with stable tube placement, mild L-base atelectasis remains 11/3 failed weaning this AM due to lack of effort 11/4 TC x15 minutes so far, no events overnight 11/5 Full vent support overnight with wean on TC, remains altered overnight despite methadone  VITAL SIGNS: BP (!) 126/97   Pulse (!) 110   Temp 99.2 F (37.3 C) (Oral)   Resp 13   Ht 5\' 9"  (1.753 m)   Wt 125 lb 0 oz (56.7 kg)   LMP 09/09/2014 (Approximate)   SpO2 97%   BMI 18.46 kg/m   HEMODYNAMICS: CVP:  [0 mmHg-7 mmHg] 5 mmHg  VENTILATOR SETTINGS: Vent Mode: PCV FiO2 (%):  [28 %-40 %] 28 % Set Rate:  [12 bmp] 12 bmp PEEP:  [5 cmH20] 5 cmH20 Plateau Pressure:  [16 cmH20-18 cmH20] 18 cmH20  INTAKE / OUTPUT: I/O last 3 completed shifts: In: 2989.8 [I.V.:624.8;  NG/GT:1800; IV Piggyback:565] Out: 6045 [Urine:3810]   EXAM: General: MV female, restless in bed, awake following commands but doe seem lethargic Neuro:  Wakes up to name, moving all ext spontaneously HEENT:  [place]/AT, PERRL, EOM-I and MMM Cardiovascular: tachycardic with regular rhythm, no  m/r/g Lungs: Coarse BS diffusely Abdomen:  Soft, NT, ND and +BS Musculoskeletal:  No acute deformity Skin:  Intact, MMM  LABS  PULMONARY Recent Labs  Lab 09/13/17 1117  09/15/17 0413 09/16/17 0415 09/17/17 0305 09/18/17 2230 09/19/17 0305  PHART 7.348*   < > 7.417 7.456* 7.438 7.535* 7.509*  PCO2ART 47.1   < > 56.3* 52.0* 53.8* 31.8* 35.0  PO2ART 59.0*   < > 108 130* 87.5 180* 143*  HCO3 25.9   < > 35.6* 36.1* 35.8* 26.8 27.5  TCO2 27  --   --   --   --   --   --   O2SAT 88.0   < > 97.8 98.8 96.7 99.2 98.7   < > = values in this interval not displayed.   CBC Recent Labs  Lab 09/17/17 0328 09/19/17 0440 09/20/17 0427  HGB 11.8* 10.3* 10.8*  HCT 36.8 31.6* 32.3*  WBC 15.3* 18.7* 18.9*  PLT 273 226 223   COAGULATION Recent Labs  Lab 09/17/17 0834  INR 0.92   CARDIAC No results for input(s): TROPONINI in the last 168 hours. No results for input(s): PROBNP in the last 168 hours.  CHEMISTRY Recent Labs  Lab 09/15/17 0505 09/16/17 0500 09/17/17 0328 09/19/17 0440 09/19/17 1945 09/20/17 0427  NA 139 138 138 139 138 138  K 3.4* 4.0 4.6 2.9* 3.7 4.0  CL 96* 95* 96* 103 103 100*  CO2 35* 36* 35* 29 28 28   GLUCOSE 115* 115* 133* 95 101* 99  BUN 23* 25* 28* 17 15 21*  CREATININE 0.59 0.58 0.59 0.48 0.52 0.52  CALCIUM 8.6* 8.7* 8.8* 8.6* 8.8* 8.7*  MG 1.9 2.0 2.0 1.8  --  2.0  PHOS 3.3 3.1 3.3 3.1  --  3.9   Estimated Creatinine Clearance: 72.8 mL/min (by C-G formula based on SCr of 0.52 mg/dL).  LIVER Recent Labs  Lab 09/17/17 0834  INR 0.92   INFECTIOUS No results for input(s): LATICACIDVEN, PROCALCITON in the last 168 hours. ENDOCRINE CBG (last 3)  Recent Labs    09/19/17 2028 09/19/17 2351 09/20/17 0354  GLUCAP 105* 139* 109*   IMAGING x48h  - image(s) personally visualized  -   highlighted in bold No results found.   ASSESSMENT and PLAN  PULM COPD with acute exacerbation -TC as tolerated -Vent at night -Albuterol 2.5 mg nebs q4H PRN  wheezing. Brovana BID, budesnoide BID, DUONEBs scheduled q6H -Switching Solumedrol 40 mg q12H to prednisone 40 mg per tub QD -Doxycycline 10/26 >10/31  CARDIAC Chronic combined CHF - LVEF 55% appears euvolemic. Trop neg BNP 50.8>>201.1 ?Heart block -Restart home lopressor 12. 5 mg per tube BID -Hold home lisinopril  -Lovenox SQ -D/c Neo-synephrine gtt as of 11/4  RENAL AKI resolved -Replace electrolyte as indicated -Hold lasix -KVO IVF  GI Nutrition -TF -Protonix PO for SUP  NEURO Acute encephalopathy Agitation -Versed/fentanyl drips needed even while on TC -No precedex for bradycardia -RASS goal -1 -Clonazepam 2 mg per tube TID -Seroquel 100 mg per tube BID -Valproate 500 mg IV BID -Stopping methadone 5 mg Q8H -Will obtain EKG and likely start Haldol -Restraints for medical non-violent -Will check QT interval and if normal will start methadone, sedation remains a  major issue here  HEME Leukocytosis, afebrile, likely steroid related -Monitor WBC/fevers  FAMILY  -No family bedside this AM.  Harriet Butte, DO [place]

## 2017-09-20 NOTE — Progress Notes (Signed)
[facility] Progress Note Patient Name: Caitlin [name] DOB: 1966-06-03 MRN: [id number]   Date of Service  09/20/2017  HPI/Events of Note  Agitation - Request to add bilateral soft wrist restraints/  eICU Interventions  Will order: 1. Bilateral soft wrist restraints.      Intervention Category Minor Interventions: Agitation / anxiety - evaluation and management  Lysle Dingwall 09/20/2017, 8:57 PM

## 2017-09-21 DIAGNOSIS — J9622 Acute and chronic respiratory failure with hypercapnia: Secondary | ICD-10-CM

## 2017-09-21 LAB — CBC WITH DIFFERENTIAL/PLATELET
BASOS ABS: 0 10*3/uL (ref 0.0–0.1)
BASOS PCT: 0 %
EOS PCT: 0 %
Eosinophils Absolute: 0 10*3/uL (ref 0.0–0.7)
HEMATOCRIT: 36.8 % (ref 36.0–46.0)
HEMOGLOBIN: 12.1 g/dL (ref 12.0–15.0)
LYMPHS ABS: 5.1 10*3/uL — AB (ref 0.7–4.0)
LYMPHS PCT: 25 %
MCH: 28.5 pg (ref 26.0–34.0)
MCHC: 32.9 g/dL (ref 30.0–36.0)
MCV: 86.8 fL (ref 78.0–100.0)
MONOS PCT: 11 %
Monocytes Absolute: 2.2 10*3/uL — ABNORMAL HIGH (ref 0.1–1.0)
NEUTROS ABS: 13.1 10*3/uL — AB (ref 1.7–7.7)
Neutrophils Relative %: 64 %
Platelets: 294 10*3/uL (ref 150–400)
RBC: 4.24 MIL/uL (ref 3.87–5.11)
RDW: 14.2 % (ref 11.5–15.5)
WBC: 20.4 10*3/uL — ABNORMAL HIGH (ref 4.0–10.5)

## 2017-09-21 LAB — RENAL FUNCTION PANEL
ANION GAP: 8 (ref 5–15)
Albumin: 2.8 g/dL — ABNORMAL LOW (ref 3.5–5.0)
BUN: 18 mg/dL (ref 6–20)
CHLORIDE: 100 mmol/L — AB (ref 101–111)
CO2: 30 mmol/L (ref 22–32)
Calcium: 9.2 mg/dL (ref 8.9–10.3)
Creatinine, Ser: 0.49 mg/dL (ref 0.44–1.00)
GFR calc Af Amer: >60 mL/min (ref >60)
GFR calc non Af Amer: >60 mL/min (ref >60)
GLUCOSE: 103 mg/dL — AB (ref 65–99)
PHOSPHORUS: 3.5 mg/dL (ref 2.5–4.6)
POTASSIUM: 3.3 mmol/L — AB (ref 3.5–5.1)
Sodium: 138 mmol/L (ref 135–145)

## 2017-09-21 LAB — GLUCOSE, CAPILLARY
GLUCOSE-CAPILLARY: 93 mg/dL (ref 65–99)
GLUCOSE-CAPILLARY: 110 mg/dL — AB (ref 65–99)
Glucose-Capillary: 104 mg/dL — ABNORMAL HIGH (ref 65–99)
Glucose-Capillary: 208 mg/dL — ABNORMAL HIGH (ref 65–99)
Glucose-Capillary: 89 mg/dL (ref 65–99)
Glucose-Capillary: 98 mg/dL (ref 65–99)

## 2017-09-21 LAB — MAGNESIUM: Magnesium: 1.9 mg/dL (ref 1.7–2.4)

## 2017-09-21 MED ORDER — SODIUM CHLORIDE 0.9% FLUSH: 10.0000 mL | INTRAVENOUS | Status: DC | PRN

## 2017-09-21 MED ORDER — VITAL AF 1.2 CAL PO LIQD
1000.0000 mL | ORAL | Status: DC
  Administered 2017-09-21 – 2017-09-27 (×8): 1000 mL
  Filled 2017-09-21 (×5): qty 1000

## 2017-09-21 MED ORDER — FLEET ENEMA 7-19 GM/118ML RE ENEM
1.0000 | ENEMA | Freq: Once | RECTAL | Status: AC
  Administered 2017-09-21: 1 via RECTAL
  Filled 2017-09-21: qty 1

## 2017-09-21 MED ORDER — SODIUM CHLORIDE 0.9% FLUSH
10.0000 mL | Freq: Two times a day (BID) | INTRAVENOUS | Status: DC
  Administered 2017-09-21 (×2): 10 mL

## 2017-09-21 MED ORDER — INSULIN ASPART 100 UNIT/ML ~~LOC~~ SOLN
0.0000 [IU] | SUBCUTANEOUS | Status: DC
  Administered 2017-09-21: 3 [IU] via SUBCUTANEOUS
  Administered 2017-09-22 – 2017-09-26 (×4): 1 [IU] via SUBCUTANEOUS

## 2017-09-21 MED ORDER — POTASSIUM CHLORIDE 20 MEQ/15ML (10%) PO SOLN
40.0000 meq | Freq: Once | ORAL | Status: AC
  Administered 2017-09-21: 40 meq
  Filled 2017-09-21: qty 30

## 2017-09-21 MED ORDER — SODIUM CHLORIDE 0.9 % IV BOLUS (SEPSIS): 1000.0000 mL | Freq: Once | INTRAVENOUS | Status: AC

## 2017-09-21 MED ORDER — SODIUM CHLORIDE 0.9 % IV BOLUS (SEPSIS)
1000.0000 mL | Freq: Once | INTRAVENOUS | Status: AC
  Administered 2017-09-21: 1000 mL via INTRAVENOUS

## 2017-09-21 NOTE — NC FL2 (Signed)
[place] LEVEL OF CARE SCREENING TOOL     IDENTIFICATION  Patient Name: Caitlin [name] Birthdate: 09/20/1966 Sex: female Admission Date (Current Location): 09/08/2017  [facility] and [place] Number:  Herbalist and Address:  The . [facility], [place] [address] [place], [place] [address]      Provider Number: [id number]  Attending Physician Name and Address:  Rigoberto Noel, MD  Relative Name and Phone Number:       Current Level of Care: SNF Recommended Level of Care: Vent SNF Prior Approval Number:    Date Approved/Denied:   PASRR Number:   [id number]  Discharge Plan: SNF    Current Diagnoses: Patient Active Problem List   Diagnosis Date Noted  . SVT (supraventricular tachycardia) ([place])   . Acute respiratory failure with hypoxia ([place])   . Hypotension   . At risk for stress ulcer 09/12/2017  . AKI (acute kidney injury) ([place]) 09/11/2017  . Encounter for care related to feeding tube 09/11/2017  . No contraindication to deep vein thrombosis (DVT) prophylaxis 09/11/2017  . Shortness of breath 09/09/2017  . Acute on chronic respiratory failure ([place]) 09/08/2017  . COPD exacerbation ([place]) 07/15/2017  . Tobacco use 07/15/2017  . Acute combined systolic and diastolic heart failure ([facility])     Orientation RESPIRATION BLADDER Height & Weight     (pt is intubated at this time. )  Tracheostomy([place] 50m cuffed) Incontinent Weight: 124 lb 12.5 oz (56.6 kg) Height:  '5\' 9"'$  (175.3 cm)  BEHAVIORAL SYMPTOMS/MOOD NEUROLOGICAL BOWEL NUTRITION STATUS      Incontinent Diet(please see discharge summary.)  AMBULATORY STATUS COMMUNICATION OF NEEDS Skin   Total Care   Normal                       Personal Care Assistance Level of Assistance  Total care       Total Care Assistance: Maximum assistance   Functional Limitations Info  Sight, Hearing, Speech Sight Info: Adequate Hearing Info: Adequate Speech Info: Impaired(pt has a  trach.)    SPECIAL CARE FACTORS FREQUENCY                       Contractures Contractures Info: Not present    Additional Factors Info  Code Status, Allergies Code Status Info: Full  Allergies Info: NKA           Current Medications (09/21/2017):  This is the current hospital active medication list Current Facility-Administered Medications  Medication Dose Route Frequency Provider Last Rate Last Dose  . albuterol (PROVENTIL) (2.5 MG/3ML) 0.083% nebulizer solution 2.5 mg  2.5 mg Nebulization Q4H PRN Nedrud, MLarena Glassman MD   2.5 mg at 09/10/17 1058  . arformoterol (BROVANA) nebulizer solution 15 mcg  15 mcg Nebulization BID HCorey Harold NP   15 mcg at 09/21/17 0738  . bisacodyl (DULCOLAX) suppository 10 mg  10 mg Rectal Daily PRN SJennelle HumanB, NP      . budesonide (PULMICORT) nebulizer solution 0.5 mg  0.5 mg Nebulization BID HCorey Harold NP   0.5 mg at 09/21/17 0738  . chlorhexidine gluconate (MEDLINE KIT) (PERIDEX) 0.12 % solution 15 mL  15 mL Mouth Rinse BID RLaverle Hobby MD   15 mL at 09/21/17 0735  . Chlorhexidine Gluconate Cloth 2 % PADS 6 each  6 each Topical Daily YRush Farmer MD   6 each at 09/20/17 1154  . docusate (COLACE) 50 MG/5ML  liquid 100 mg  100 mg Per Tube BID Everrett Coombe, MD   100 mg at 09/20/17 2127  . enoxaparin (LOVENOX) injection 40 mg  40 mg Subcutaneous Q24H Hoffman, Jessica Ratliff, DO   40 mg at 09/20/17 1737  . feeding supplement (VITAL AF 1.2 CAL) liquid 1,000 mL  1,000 mL Per Tube Continuous [name], MD 50 mL/hr at 09/21/17 0800 1,000 mL at 09/21/17 0800  . fentaNYL (SUBLIMAZE) injection 25-100 mcg  25-100 mcg Intravenous Q1H PRN Javier Glazier, MD   100 mcg at 09/20/17 1340  . guaiFENesin (ROBITUSSIN) 100 MG/5ML solution 200 mg  10 mL Per Tube TID Brand Males, MD   200 mg at 09/20/17 2127  . haloperidol lactate (HALDOL) injection 5 mg  5 mg Intravenous Q6H Javier Glazier, MD   5 mg at 09/21/17 0406  .  haloperidol lactate (HALDOL) injection 5 mg  5 mg Intravenous Q6H PRN Javier Glazier, MD      . ipratropium-albuterol (DUONEB) 0.5-2.5 (3) MG/3ML nebulizer solution 3 mL  3 mL Nebulization Q6H Corey Harold, NP   3 mL at 09/21/17 0738  . MEDLINE mouth rinse  15 mL Mouth Rinse QID Laverle Hobby, MD   15 mL at 09/21/17 0406  . metoprolol tartrate (LOPRESSOR) 25 mg/10 mL oral suspension 12.5 mg  12.5 mg Per Tube BID [name], MD   12.5 mg at 09/20/17 2128  . midazolam (VERSED) injection 1 mg  1 mg Intravenous Q1H PRN Anders Simmonds, MD   1 mg at 09/20/17 0603  . nicotine (NICODERM CQ - dosed in mg/24 hr) patch 7 mg  7 mg Transdermal Daily Javier Glazier, MD   7 mg at 09/20/17 0930  . pantoprazole sodium (PROTONIX) 40 mg/20 mL oral suspension 40 mg  40 mg Per Tube Daily [name], MD   40 mg at 09/20/17 0927  . polyethylene glycol (MIRALAX / GLYCOLAX) packet 17 g  17 g Oral BID [name], MD   17 g at 09/20/17 2127  . potassium chloride 20 MEQ/15ML (10%) solution 40 mEq  40 mEq Per Tube Once Javier Glazier, MD      . predniSONE 5 MG/ML concentrated solution 40 mg  40 mg Per Tube Q breakfast Javier Glazier, MD   40 mg at 09/21/17 0735  . QUEtiapine (SEROQUEL) tablet 100 mg  100 mg Per Tube BID [name], MD   100 mg at 09/20/17 2128  . sennosides (SENOKOT) 8.8 MG/5ML syrup 5 mL  5 mL Oral BID [name], MD   5 mL at 09/20/17 2127  . sodium chloride flush (NS) 0.9 % injection 10-40 mL  10-40 mL Intracatheter Q12H [name], MD   10 mL at 09/19/17 2155  . sodium chloride flush (NS) 0.9 % injection 10-40 mL  10-40 mL Intracatheter PRN [name], MD   10 mL at 09/20/17 1611  . sodium chloride flush (NS) 0.9 % injection 10-40 mL  10-40 mL Intracatheter Q12H Kara Mead V, MD      . sodium chloride flush (NS) 0.9 % injection 10-40 mL  10-40 mL Intracatheter PRN Rigoberto Noel, MD      . sodium phosphate (FLEET) 7-19 GM/118ML enema 1 enema   1 enema Rectal Once Javier Glazier, MD      . thiamine (B-1) injection 100 mg  100 mg Intravenous Daily Javier Glazier, MD   100 mg  at 09/20/17 0928  . valproate (DEPACON) 500 mg in dextrose 5 % 50 mL IVPB  500 mg Intravenous Q12H [name], MD   Stopped at 09/20/17 2227     Discharge Medications: Please see discharge summary for a list of discharge medications.  Relevant Imaging Results:  Relevant Lab Results:   Additional Information SSN- [id number]- Pt has a trach Shiley 93m cuffed.   KWetzel Bjornstad LCSWA

## 2017-09-21 NOTE — Progress Notes (Signed)
Nutrition Follow-up  DOCUMENTATION CODES:   Underweight  INTERVENTION:    To better meet re-estimated nutrition needs, increase Vital AF 1.2 to 55 ml/h via Cortrak to provide 1584 kcal, 99 gm protein, 1071 ml free water daily.  NUTRITION DIAGNOSIS:   Inadequate oral intake related to inability to eat as evidenced by NPO status.  Ongoing  GOAL:   Patient will meet greater than or equal to 90% of their needs  Met with TF  MONITOR:   Vent status, TF tolerance, Labs, I & O's  ASSESSMENT:   51 yo female with PMH of COPD, CHF, NSTEMI who was admitted on 10/24 with COPD exacerbation; required intubation on 10/26.  Discussed patient in ICU rounds and with RN today. S/P tracheostomy and Cortrak enteral feeding tube placement on 11/2. Patient had a large BM this morning.  Currently receiving Vital AF 1.2 at 50 ml/h via Cortrak tube to provide 1440 kcal, 90 gm protein, 973 ml free water daily. Patient remains on ventilator support. MV: 9.7 L/min Temp (24hrs), Avg:98.6 F (37 C), Min:97.5 F (36.4 C), Max:99.7 F (37.6 C)  Propofol has been discontinued. Labs reviewed. Potassium 3.3 (L) CBG's: [id number] Medications reviewed.  Diet Order:  Diet NPO time specified  EDUCATION NEEDS:   No education needs have been identified at this time  Skin:  Skin Assessment: Reviewed RN Assessment  Last BM:  11/6  Height:   Ht Readings from Last 1 Encounters:  09/10/17 '5\' 9"'$  (1.753 m)    Weight:   Wt Readings from Last 1 Encounters:  09/21/17 124 lb 12.5 oz (56.6 kg)    Ideal Body Weight:  65.9 kg  BMI:  Body mass index is 18.43 kg/m.  Estimated Nutritional Needs:   Kcal:  1550  Protein:  85-95 gm  Fluid:  1.5 L   Molli Barrows, RD, LDN, [place] Pager [phone] After Hours Pager [phone]

## 2017-09-21 NOTE — Progress Notes (Signed)
CSW spoke with [name] and confirmed that it was okay for CSW to seek out of state placement for pt at this time. CSW has faxed pt out to vent/snf facilities at this time.    Caitlin [name], MSW, [facility] Clinical Social Worker [phone]

## 2017-09-21 NOTE — Progress Notes (Signed)
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  She also has a 30-pack-year history of smoking and continues to smoke.  She been living with her sister and making progress for rehabilitation standpoint.  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  She took her rescue bronchodilators without much relief because of this she went to the emergency room.  She was given bronchodilator therapy, and sent home with a prescription of prednisone.  Her symptoms did not get better, she returned to the emergency room on the 24th with resting shortness of breath, and almost continuous use of her rescue bronchodilator without relief.  Her initial chest x-ray was without infiltrate or evidence of edema, she had no sick exposures, no fever or chills.  She was admitted with a working diagnosis of acute exacerbation chronic obstructive pulmonary disease, and treated appropriately with supplemental oxygen, inhaled bronchodilators, and systemic steroids.  On the evening of 10/25 she developed acute distress, was found by nursing staff tripod in bed.  Was treated with IV Lasix, pulse IV steroid, and placed on noninvasive positive pressure ventilation.  Throughout the course of the evening and going into the a.m. hours of 10/26.  Her work of breathing has remained quite elevated.  She has been on and off BiPAP, continues to have cough, continues to wheeze, has not been getting much relief from rescue bronchodilation.  Critical care was asked to  see because of work of breathing, noninvasive positive pressure ventilation requirements, and concern about clinical deterioration.   EVENTS 10/24 admit 10/26 intubated 11/2 tracheotomy placed  SUBJECTIVE/OVERNIGHT/INTERVAL HX 10/28 Unable to wean due to agitation and persistent bronchospasm. 10/29 agitation overnight, also with bradycardia down to 30s so precedex and propofol held. She was noted to have pauses on cardiac monitoring which may be consistent with heart block. 10/31 no acute events overnight. Patient sedated on fentanyl versed and propofol. 11/1 no acute events, remains sedated on fentanyl, versed, and propofol 11/2 hypotension overnight improved with d/c prop and initiation of bolus followed by change in mentation improved with restarting prop. Tachycardic event resolved with advancement of ETT. Plan to trach. Improved aeration of R-lung base on CXR with stable tube placement, mild L-base atelectasis remains 11/3 failed weaning this AM due to lack of effort 11/4 TC x15 minutes so far, no events overnight 11/5 Full vent support overnight with wean on TC, remains altered overnight despite methadone 11/6 Remained on TC overnight. Required soft restraints for agitation.   VITAL SIGNS: BP 123/83   Pulse (!) 120   Temp 97.6 F (36.4 C) (Oral)   Resp (!) 27   Ht 5\' 9"  (1.753 m)   Wt 124 lb 12.5 oz (56.6 kg)   LMP 09/09/2014 (Approximate)   SpO2 100%   BMI 18.43 kg/m   HEMODYNAMICS: CVP:  [2 mmHg] 2 mmHg  VENTILATOR SETTINGS: FiO2 (%):  [28 %] 28 %  INTAKE / OUTPUT: I/O last 3 completed shifts: In: 2928.9 [I.V.:323.9; NG/GT:2035; IV Piggyback:570] Out: 4098 [Urine:3895]  UOP 1.66 L  EXAM: General: Restless in  bed, follows commands but does appear lethargic Neuro: Wakes up to name, moving all extremities spontaneously HEENT: [place]/AT, PERRLA, EOMI CV: Tachycardic with regular rhythm without murmurs, rubs, or gallops Lungs: Coarse breath sounds  diffusely Abdomen: soft, non-tender, no masses or organomegaly palpable, normoactive bowel sounds Skin: warm, dry, no rashes or lesions, cap refill < 2 seconds Extremities: warm and well perfused, normal tone  LABS  PULMONARY Recent Labs  Lab 09/15/17 0413 09/16/17 0415 09/17/17 0305 09/18/17 2230 09/19/17 0305  PHART 7.417 7.456* 7.438 7.535* 7.509*  PCO2ART 56.3* 52.0* 53.8* 31.8* 35.0  PO2ART 108 130* 87.5 180* 143*  HCO3 35.6* 36.1* 35.8* 26.8 27.5  O2SAT 97.8 98.8 96.7 99.2 98.7   CBC Recent Labs  Lab 09/19/17 0440 09/20/17 0427 09/21/17 0356  HGB 10.3* 10.8* 12.1  HCT 31.6* 32.3* 36.8  WBC 18.7* 18.9* 20.4*  PLT 226 223 294   COAGULATION Recent Labs  Lab 09/17/17 0834  INR 0.92   CARDIAC No results for input(s): TROPONINI in the last 168 hours. No results for input(s): PROBNP in the last 168 hours.  CHEMISTRY Recent Labs  Lab 09/16/17 0500 09/17/17 0328 09/19/17 0440 09/19/17 1945 09/20/17 0427 09/21/17 0357  NA 138 138 139 138 138 138  K 4.0 4.6 2.9* 3.7 4.0 3.3*  CL 95* 96* 103 103 100* 100*  CO2 36* 35* 29 28 28 30   GLUCOSE 115* 133* 95 101* 99 103*  BUN 25* 28* 17 15 21* 18  CREATININE 0.58 0.59 0.48 0.52 0.52 0.49  CALCIUM 8.7* 8.8* 8.6* 8.8* 8.7* 9.2  MG 2.0 2.0 1.8  --  2.0 1.9  PHOS 3.1 3.3 3.1  --  3.9 3.5   Estimated Creatinine Clearance: 72.7 mL/min (by C-G formula based on SCr of 0.49 mg/dL).  LIVER Recent Labs  Lab 09/17/17 0834 09/21/17 0357  ALBUMIN  --  2.8*  INR 0.92  --    INFECTIOUS No results for input(s): LATICACIDVEN, PROCALCITON in the last 168 hours. ENDOCRINE CBG (last 3)  Recent Labs    09/20/17 1530 09/20/17 2019 09/21/17 0342  GLUCAP 126* 94 89   IMAGING x48h  - image(s) personally visualized  -   highlighted in bold No results found.   ASSESSMENT and PLAN  PULM Acute on chronic hypercarbic respiratory failure, COPD - TC as tolerated, full vent at night - Albuterol 2.5 mg nebs Q4H PRN  wheezing, Brovana BID, budesnoide BID, Duoneb scheduled q6H - Prednisone 40 mg per tub QD - Doxycycline 10/26 >10/31  CARDIAC Chronic combined CHF - LVEF 55% appears euvolemic. Trop neg BNP 50.8>>201.1 ?Heart block - Lopressor 12. 5 mg per tube BID - Hold home lisinopril  - Lovenox SQ - Off Neo-synephrine gtt as of 11/4 - EKG 12-lead  RENAL AKI resolved Hypokalemia - Replace electrolyte as indicated - Hold lasix - KVO IVF - Potassium chloride 20 mEq per tube x1  GI Nutrition - TF - Protonix PO for SUP - Miralax BID, Colace 100 mg BID, Senna BID - Robitussin 200 mg TID - Will trial Fleet enema following manual de-impaction  NEURO Acute encephalopathy Agitation - Versed/fentanyl/Haldol IV PRN while on TC - No precedex for bradycardia - RASS goal -1 - Will d/c Clonazepam 2 mg per tube TID - Seroquel 100 mg per tube BID - Haldol IV 5 mg Q6H - Daily thiamine - Restraints for medical non-violent  ENDOCRINE Hyperthyroidism, TSH 0.158, FT4 1.18 - Will need follow-up  HEME Leukocytosis, afebrile, likely steroid related - Monitor WBC/fevers  FAMILY  - No family bedside this AM  Harriet Butte, [place]

## 2017-09-21 NOTE — Care Management Note (Signed)
Case Management Note  Patient Details  Name: Caitlin [name] MRN: [id number] Date of Birth: Sep 07, 1966  Subjective/Objective:    Pt admitted with SOB                Action/Plan:   PTA from home.  Per attending pt will likely need trach/snf at discharge.  Pt received trach but remains on ventilator at this time.  CSW following and will pursue trach/snf placement when appropriate.  Pt is not a LTACH candidate due to lack of benefits.  CM will continue to monitor for discharge needs    Expected Discharge Date:                  Expected Discharge Plan:  [place]  In-House Referral:     Discharge planning Services  CM Consult  Post Acute Care Choice:    Choice offered to:     DME Arranged:    DME Agency:     HH Arranged:    HH Agency:     Status of Service:  In process, will continue to follow  If discussed at Long Length of Stay Meetings, dates discussed:    Additional Comments: 09/21/2017 Discussed in LOS 11/6 - pt remains appropriate for continued stay.  Pt remains on ventilator at night - able to do TC during the day when able to tolerate.  Sedation drip has been discontinued and Seroquel added.  CSW actively pursuing vent snf placement Maryclare Labrador, RN 09/21/2017, 3:51 PM

## 2017-09-22 LAB — CBC WITH DIFFERENTIAL/PLATELET
BASOS PCT: 0 %
Basophils Absolute: 0 10*3/uL (ref 0.0–0.1)
EOS ABS: 0.2 10*3/uL (ref 0.0–0.7)
EOS PCT: 1 %
HEMATOCRIT: 33.9 % — AB (ref 36.0–46.0)
Hemoglobin: 11.1 g/dL — ABNORMAL LOW (ref 12.0–15.0)
LYMPHS PCT: 16 %
Lymphs Abs: 3 10*3/uL (ref 0.7–4.0)
MCH: 28.8 pg (ref 26.0–34.0)
MCHC: 32.7 g/dL (ref 30.0–36.0)
MCV: 87.8 fL (ref 78.0–100.0)
MONOS PCT: 9 %
Monocytes Absolute: 1.7 10*3/uL — ABNORMAL HIGH (ref 0.1–1.0)
NEUTROS ABS: 13.6 10*3/uL — AB (ref 1.7–7.7)
Neutrophils Relative %: 74 %
Platelets: 285 10*3/uL (ref 150–400)
RBC: 3.86 MIL/uL — ABNORMAL LOW (ref 3.87–5.11)
RDW: 14.6 % (ref 11.5–15.5)
WBC: 18.5 10*3/uL — ABNORMAL HIGH (ref 4.0–10.5)

## 2017-09-22 LAB — RENAL FUNCTION PANEL
ANION GAP: 7 (ref 5–15)
Albumin: 2.3 g/dL — ABNORMAL LOW (ref 3.5–5.0)
BUN: 18 mg/dL (ref 6–20)
CHLORIDE: 105 mmol/L (ref 101–111)
CO2: 27 mmol/L (ref 22–32)
Calcium: 8.5 mg/dL — ABNORMAL LOW (ref 8.9–10.3)
Creatinine, Ser: 0.49 mg/dL (ref 0.44–1.00)
GFR calc Af Amer: >60 mL/min (ref >60)
GFR calc non Af Amer: >60 mL/min (ref >60)
GLUCOSE: 111 mg/dL — AB (ref 65–99)
POTASSIUM: 3.3 mmol/L — AB (ref 3.5–5.1)
Phosphorus: 3.6 mg/dL (ref 2.5–4.6)
Sodium: 139 mmol/L (ref 135–145)

## 2017-09-22 LAB — GLUCOSE, CAPILLARY
GLUCOSE-CAPILLARY: 112 mg/dL — AB (ref 65–99)
GLUCOSE-CAPILLARY: 112 mg/dL — AB (ref 65–99)
Glucose-Capillary: 101 mg/dL — ABNORMAL HIGH (ref 65–99)
Glucose-Capillary: 121 mg/dL — ABNORMAL HIGH (ref 65–99)
Glucose-Capillary: 96 mg/dL (ref 65–99)

## 2017-09-22 LAB — CULTURE, RESPIRATORY

## 2017-09-22 LAB — CULTURE, RESPIRATORY W GRAM STAIN

## 2017-09-22 LAB — MAGNESIUM: Magnesium: 2 mg/dL (ref 1.7–2.4)

## 2017-09-22 MED ORDER — POTASSIUM CHLORIDE 20 MEQ/15ML (10%) PO SOLN
20.0000 meq | ORAL | Status: AC
  Administered 2017-09-22 (×2): 20 meq
  Filled 2017-09-22 (×3): qty 15

## 2017-09-22 MED ORDER — AZITHROMYCIN 500 MG PO TABS
500.0000 mg | ORAL_TABLET | Freq: Every day | ORAL | Status: DC
  Filled 2017-09-22: qty 1

## 2017-09-22 MED ORDER — AZITHROMYCIN 250 MG PO TABS: 250.0000 mg | ORAL_TABLET | Freq: Every day | ORAL | Status: DC

## 2017-09-22 MED ORDER — AMOXICILLIN-POT CLAVULANATE 400-57 MG/5ML PO SUSR
800.0000 mg | Freq: Two times a day (BID) | ORAL | Status: DC
  Administered 2017-09-22 – 2017-09-27 (×11): 800 mg
  Filled 2017-09-22 (×15): qty 10

## 2017-09-22 MED ORDER — MIDAZOLAM HCL 2 MG/2ML IJ SOLN: 1.0000 mg | INTRAMUSCULAR | Status: DC | PRN

## 2017-09-22 MED ORDER — PREDNISONE 5 MG/ML PO CONC
30.0000 mg | Freq: Every day | ORAL | Status: DC
Start: 2017-09-22 — End: 2017-09-25
  Administered 2017-09-22 – 2017-09-24 (×3): 30 mg
  Filled 2017-09-22 (×5): qty 6

## 2017-09-22 NOTE — Progress Notes (Signed)
CSW received call from [facility] that [facility] can accept pt but would have to meet with family to do paperwork. CSW informed Tawanna Sat that at this time pt is not yet ready but would follow up with her tomorrow with further information regarding pt and information  To follow up with pt's family as needed. CSW continues to follow for discharge needs.    [name] Michaelann Gunnoe, MSW, [facility] Clinical Social Worker [phone]

## 2017-09-22 NOTE — Progress Notes (Signed)
[facility] ADULT ICU REPLACEMENT PROTOCOL FOR AM LAB REPLACEMENT ONLY  The patient does apply for the [facility] Adult ICU Electrolyte Replacment Protocol based on the criteria listed below:   1. Is GFR >/= 40 ml/min? Yes.    Patient's GFR today is >60 2. Is urine output >/= 0.5 ml/kg/hr for the last 6 hours? Yes.   Patient's UOP is 1.7 ml/kg/hr 3. Is BUN < 60 mg/dL? Yes.    Patient's BUN today is 18 4. Abnormal electrolyte(s): K3.3 5. Ordered repletion with: per protocol 6. If a panic level lab has been reported, has the CCM MD in charge been notified? Yes.  .   Physician:  Beatrix Shipper, MD.   Vear Clock 09/22/2017 5:40 AM

## 2017-09-22 NOTE — Progress Notes (Signed)
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  She also has a 30-pack-year history of smoking and continues to smoke.  She been living with her sister and making progress for rehabilitation standpoint.  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  She took her rescue bronchodilators without much relief because of this she went to the emergency room.  She was given bronchodilator therapy, and sent home with a prescription of prednisone.  Her symptoms did not get better, she returned to the emergency room on the 24th with resting shortness of breath, and almost continuous use of her rescue bronchodilator without relief.  Her initial chest x-ray was without infiltrate or evidence of edema, she had no sick exposures, no fever or chills.  She was admitted with a working diagnosis of acute exacerbation chronic obstructive pulmonary disease, and treated appropriately with supplemental oxygen, inhaled bronchodilators, and systemic steroids.  On the evening of 10/25 she developed acute distress, was found by nursing staff tripod in bed.  Was treated with IV Lasix, pulse IV steroid, and placed on noninvasive positive pressure ventilation.  Throughout the course of the evening and going into the a.m. hours of 10/26.  Her work of breathing has remained quite elevated.  She has been on and off BiPAP, continues to have cough, continues to wheeze, has not been getting much relief from rescue bronchodilation.  Critical care was asked to  see because of work of breathing, noninvasive positive pressure ventilation requirements, and concern about clinical deterioration.   EVENTS 10/24 admit 10/26 intubated 11/2 tracheotomy placed  SUBJECTIVE/OVERNIGHT/INTERVAL HX 10/28 Unable to wean due to agitation and persistent bronchospasm. 10/29 agitation overnight, also with bradycardia down to 30s so precedex and propofol held. She was noted to have pauses on cardiac monitoring which may be consistent with heart block. 10/31 no acute events overnight. Patient sedated on fentanyl versed and propofol. 11/1 no acute events, remains sedated on fentanyl, versed, and propofol 11/2 hypotension overnight improved with d/c prop and initiation of bolus followed by change in mentation improved with restarting prop. Tachycardic event resolved with advancement of ETT. Plan to trach. Improved aeration of R-lung base on CXR with stable tube placement, mild L-base atelectasis remains 11/3 failed weaning this AM due to lack of effort 11/4 TC x15 minutes so far, no events overnight 11/5 Full vent support overnight with wean on TC, remains altered overnight despite methadone 11/6 Remained on TC overnight. Required soft restraints for agitation.  11/7 Decreasing prednisone, stable on TC  VITAL SIGNS: BP (!) 91/56   Pulse (!) 111   Temp 99.7 F (37.6 C) (Oral)   Resp (!) 28   Ht 5\' 9"  (1.753 m)   Wt 122 lb 5.7 oz (55.5 kg)   LMP 09/09/2014 (Approximate)   SpO2 99%   BMI 18.07 kg/m   HEMODYNAMICS: CVP:  [1 mmHg] 1 mmHg  VENTILATOR SETTINGS: Vent Mode: PCV FiO2 (%):  [28 %-40 %] 28 % Set Rate:  [12 bmp] 12 bmp PEEP:  [5 cmH20] 5 cmH20 Plateau Pressure:  [  13 cmH20-17 cmH20] 13 cmH20  INTAKE / OUTPUT: I/O last 3 completed shifts: In: 3298.9 [I.V.:269.2; NG/GT:2281.3; IV Piggyback:748.3] Out: 3400 [Urine:3400] UOP 1.89 L  EXAM: General: Restless in bed, follows commands but does appear lethargic Neuro: Wakes up to name, moving all  extremities spontaneously HEENT: [place]/AT, PERRLA, EOMI CV: Tachycardic with regular rhythm without murmurs, rubs, or gallops Lungs: Coarse breath sounds diffusely Abdomen: soft, non-tender, no masses or organomegaly palpable, normoactive bowel sounds Skin: warm, dry, no rashes or lesions, cap refill < 2 seconds Extremities: warm and well perfused, normal tone  LABS  PULMONARY Recent Labs  Lab 09/16/17 0415 09/17/17 0305 09/18/17 2230 09/19/17 0305  PHART 7.456* 7.438 7.535* 7.509*  PCO2ART 52.0* 53.8* 31.8* 35.0  PO2ART 130* 87.5 180* 143*  HCO3 36.1* 35.8* 26.8 27.5  O2SAT 98.8 96.7 99.2 98.7   CBC Recent Labs  Lab 09/20/17 0427 09/21/17 0356 09/22/17 0411  HGB 10.8* 12.1 11.1*  HCT 32.3* 36.8 33.9*  WBC 18.9* 20.4* 18.5*  PLT 223 294 285   COAGULATION Recent Labs  Lab 09/17/17 0834  INR 0.92   CARDIAC No results for input(s): TROPONINI in the last 168 hours. No results for input(s): PROBNP in the last 168 hours.  CHEMISTRY Recent Labs  Lab 09/17/17 0328 09/19/17 0440 09/19/17 1945 09/20/17 0427 09/21/17 0357 09/22/17 0411  NA 138 139 138 138 138 139  K 4.6 2.9* 3.7 4.0 3.3* 3.3*  CL 96* 103 103 100* 100* 105  CO2 35* 29 28 28 30 27   GLUCOSE 133* 95 101* 99 103* 111*  BUN 28* 17 15 21* 18 18  CREATININE 0.59 0.48 0.52 0.52 0.49 0.49  CALCIUM 8.8* 8.6* 8.8* 8.7* 9.2 8.5*  MG 2.0 1.8  --  2.0 1.9 2.0  PHOS 3.3 3.1  --  3.9 3.5 3.6   Estimated Creatinine Clearance: 71.3 mL/min (by C-G formula based on SCr of 0.49 mg/dL).  LIVER Recent Labs  Lab 09/17/17 0834 09/21/17 0357 09/22/17 0411  ALBUMIN  --  2.8* 2.3*  INR 0.92  --   --    INFECTIOUS No results for input(s): LATICACIDVEN, PROCALCITON in the last 168 hours. ENDOCRINE CBG (last 3)  Recent Labs    09/21/17 2013 09/21/17 2350 09/22/17 0404  GLUCAP 110* 104* 101*   IMAGING x48h  - image(s) personally visualized  -   highlighted in bold No results found.   ASSESSMENT and  PLAN  PULM Acute on chronic hypercarbic respiratory failure, COPD Tobacco use disorder - Trach collar as tolerated - Full vent support at rest - Decreasing prednisone to 30 mg daily - Continue duo nebs every 6 hours, Pulmicort twice daily, Brovana twice daily - Completed doxycycline 10/26-10/31 - Continuing nicotine patch daily  CARDIAC HFpEF (LVEF 55%), BNP 50.8 > 201.1 Primary hypertension Hypotension, off Neo gtt since 11/4 Sinus tachycardia - Telemetry - Hold home lisinopril, lopressor - Lovenox SQ  RENAL AKI resolved Hypokalemia - Replace electrolyte as indicated - Hold lasix - KVO IVF - Potassium chloride 20 mEq Q4H per tube x2  GI Nutrition - TF - Protonix PO for SUP - Miralax BID, Colace 100 mg BID, Senna BID - Robitussin 200 mg TID - Will trial Fleet enema following manual de-impaction  NEURO Acute encephalopathy Agitation, PRN meds not given in last 48 hours - Versed/fentanyl/Haldol IV PRN while on TC - No precedex for bradycardia - RASS goal -1 - Valproate BID, Haldol IV 5 mg Q6H, Seroquel 100 mg per tube BID - Daily  thiamine - Restraints for medical non-violent  ENDOCRINE Hyperthyroidism, TSH 0.158, FT4 1.18 - Will need follow-up  HEME Leukocytosis, afebrile, likely steroid related - Monitor WBC/fevers  FAMILY  - No family bedside this AM   Harriet Butte, [place]

## 2017-09-23 ENCOUNTER — Inpatient Hospital Stay (HOSPITAL_COMMUNITY): Payer: Self-pay

## 2017-09-23 LAB — CBC WITH DIFFERENTIAL/PLATELET
BASOS ABS: 0 10*3/uL (ref 0.0–0.1)
Basophils Relative: 0 %
EOS PCT: 1 %
Eosinophils Absolute: 0.2 10*3/uL (ref 0.0–0.7)
HEMATOCRIT: 37 % (ref 36.0–46.0)
HEMOGLOBIN: 12.2 g/dL (ref 12.0–15.0)
LYMPHS ABS: 3.5 10*3/uL (ref 0.7–4.0)
LYMPHS PCT: 22 %
MCH: 29 pg (ref 26.0–34.0)
MCHC: 33 g/dL (ref 30.0–36.0)
MCV: 87.9 fL (ref 78.0–100.0)
MONOS PCT: 10 %
Monocytes Absolute: 1.6 10*3/uL — ABNORMAL HIGH (ref 0.1–1.0)
Neutro Abs: 10.4 10*3/uL — ABNORMAL HIGH (ref 1.7–7.7)
Neutrophils Relative %: 67 %
Platelets: 319 10*3/uL (ref 150–400)
RBC: 4.21 MIL/uL (ref 3.87–5.11)
RDW: 14.7 % (ref 11.5–15.5)
WBC: 15.7 10*3/uL — AB (ref 4.0–10.5)

## 2017-09-23 LAB — GLUCOSE, CAPILLARY
GLUCOSE-CAPILLARY: 104 mg/dL — AB (ref 65–99)
GLUCOSE-CAPILLARY: 104 mg/dL — AB (ref 65–99)
GLUCOSE-CAPILLARY: 119 mg/dL — AB (ref 65–99)
Glucose-Capillary: 104 mg/dL — ABNORMAL HIGH (ref 65–99)
Glucose-Capillary: 115 mg/dL — ABNORMAL HIGH (ref 65–99)
Glucose-Capillary: 136 mg/dL — ABNORMAL HIGH (ref 65–99)
Glucose-Capillary: 97 mg/dL (ref 65–99)
Glucose-Capillary: 98 mg/dL (ref 65–99)

## 2017-09-23 LAB — RENAL FUNCTION PANEL
ALBUMIN: 2.5 g/dL — AB (ref 3.5–5.0)
ANION GAP: 6 (ref 5–15)
BUN: 21 mg/dL — ABNORMAL HIGH (ref 6–20)
CHLORIDE: 105 mmol/L (ref 101–111)
CO2: 28 mmol/L (ref 22–32)
Calcium: 8.7 mg/dL — ABNORMAL LOW (ref 8.9–10.3)
Creatinine, Ser: 0.55 mg/dL (ref 0.44–1.00)
Glucose, Bld: 103 mg/dL — ABNORMAL HIGH (ref 65–99)
PHOSPHORUS: 3.9 mg/dL (ref 2.5–4.6)
POTASSIUM: 3.6 mmol/L (ref 3.5–5.1)
Sodium: 139 mmol/L (ref 135–145)

## 2017-09-23 LAB — MAGNESIUM: MAGNESIUM: 2.2 mg/dL (ref 1.7–2.4)

## 2017-09-23 MED ORDER — QUETIAPINE FUMARATE 50 MG PO TABS
50.0000 mg | ORAL_TABLET | Freq: Two times a day (BID) | ORAL | Status: DC
  Administered 2017-09-23 – 2017-09-24 (×4): 50 mg
  Filled 2017-09-23 (×5): qty 1

## 2017-09-23 MED ORDER — VITAMIN B-1 100 MG PO TABS
100.0000 mg | ORAL_TABLET | Freq: Every day | ORAL | Status: DC
  Administered 2017-09-23 – 2017-09-27 (×5): 100 mg
  Filled 2017-09-23 (×5): qty 1

## 2017-09-23 MED ORDER — CHLORHEXIDINE GLUCONATE 0.12 % MT SOLN
15.0000 mL | Freq: Two times a day (BID) | OROMUCOSAL | Status: DC
  Administered 2017-09-23 – 2017-09-28 (×10): 15 mL via OROMUCOSAL
  Filled 2017-09-23 (×6): qty 15

## 2017-09-23 MED ORDER — ORAL CARE MOUTH RINSE
15.0000 mL | Freq: Two times a day (BID) | OROMUCOSAL | Status: DC
  Administered 2017-09-24 – 2017-09-28 (×8): 15 mL via OROMUCOSAL

## 2017-09-23 NOTE — Progress Notes (Signed)
PCCM Interval Progress  Called to bedside to assess pt at bedside.  Pt had fallen out of bed 1945, found by RN lying face down.  Assisted back to bed.  Pt awake, never lost consciousness.  On exam, she is awake and following commands; however, she does complain of a headache.  No external bruising or bleeding noted.  Will assess STAT head CT. Soft waist belt restraint in place. RN to monitor closely.   [name], [facility] Pager: [phone]  or [phone] 09/23/2017, 8:04 PM

## 2017-09-23 NOTE — Progress Notes (Addendum)
pt had restraints on and ordered but was not physically tied down to bed. Heard loud thump and pt was found by this RN face down on floor.   DC'ed order before fall due to pt not being restrained and following commands. Bilateral wrist restraints and posey belt reordered after fall and pt tied to bed. CCM PA at bedside to assess patient. Pt still following commands but stated she was trying "to go home."  No deficits from fall assessed.  Family notified. STAT CT head ordered. No acute findings on CT Head.

## 2017-09-23 NOTE — Progress Notes (Signed)
CSW spoke with Fredric Mare from [facility] and was informed that another facility [facility] is able to meet pt's needs (She represents both. CSW provided [place] with contact information so that she can reach out to pt's son's for further needed details on pt as well as to explain more about the facility and what it has to offer. Tawanna Sat is reaching out to family to see If they can meet with her on Monday or Tuesday of next week for paperwork. CSW continues to be available for discharge needs.   Caitlin [name], MSW, [facility] Clinical Social Worker [phone]

## 2017-09-23 NOTE — Progress Notes (Signed)
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  She also has a 30-pack-year history of smoking and continues to smoke.  She been living with her sister and making progress for rehabilitation standpoint.  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  She took her rescue bronchodilators without much relief because of this she went to the emergency room.  She was given bronchodilator therapy, and sent home with a prescription of prednisone.  Her symptoms did not get better, she returned to the emergency room on the 24th with resting shortness of breath, and almost continuous use of her rescue bronchodilator without relief.  Her initial chest x-ray was without infiltrate or evidence of edema, she had no sick exposures, no fever or chills.  She was admitted with a working diagnosis of acute exacerbation chronic obstructive pulmonary disease, and treated appropriately with supplemental oxygen, inhaled bronchodilators, and systemic steroids.  On the evening of 10/25 she developed acute distress, was found by nursing staff tripod in bed.  Was treated with IV Lasix, pulse IV steroid, and placed on noninvasive positive pressure ventilation.  Throughout the course of the evening and going into the a.m. hours of 10/26.  Her work of breathing has remained quite elevated.  She has been on and off BiPAP, continues to have cough, continues to wheeze, has not been getting much relief from rescue bronchodilation.  Critical care was asked to  see because of work of breathing, noninvasive positive pressure ventilation requirements, and concern about clinical deterioration.   EVENTS 10/24 admit 10/26 intubated 11/2 tracheotomy placed  SUBJECTIVE/OVERNIGHT/INTERVAL HX 10/28 Unable to wean due to agitation and persistent bronchospasm. 10/29 agitation overnight, also with bradycardia down to 30s so precedex and propofol held. She was noted to have pauses on cardiac monitoring which may be consistent with heart block. 10/31 no acute events overnight. Patient sedated on fentanyl versed and propofol. 11/1 no acute events, remains sedated on fentanyl, versed, and propofol 11/2 hypotension overnight improved with d/c prop and initiation of bolus followed by change in mentation improved with restarting prop. Tachycardic event resolved with advancement of ETT. Plan to trach. Improved aeration of R-lung base on CXR with stable tube placement, mild L-base atelectasis remains 11/3 failed weaning this AM due to lack of effort 11/4 TC x15 minutes so far, no events overnight 11/5 Full vent support overnight with wean on TC, remains altered overnight despite methadone 11/6 Remained on TC overnight. Required soft restraints for agitation.  11/7 Decreasing prednisone, stable on TC, started Augmentin for haemophilus tracheobronchitis/pneumonia 11/8 Remains stable on TC, afebrile on Augmentin with improved WBC  VITAL SIGNS: BP 93/65   Pulse 97   Temp 98.8 F (37.1 C) (Oral)   Resp (!) 23   Ht 5\' 9"  (1.753 m)   Wt 121 lb 4.1 oz (55 kg)   LMP 09/09/2014 (Approximate)   SpO2 100%   BMI 17.91 kg/m   HEMODYNAMICS:    VENTILATOR SETTINGS: FiO2 (%):  [28 %] 28 %  INTAKE / OUTPUT: I/O last 3 completed shifts: In:  1870 [I.V.:60; NG/GT:1810] Out: 2360 [Urine:1960; Stool:400] UOP 0.96 L  EXAM: General: calm lying in bed, follows commands Neuro: PERRLA, EOMI, moving all extremities spontaneously HEENT: [place]/AT, PERRLA, EOMI CV: Tachycardic  with regular rhythm without murmurs, rubs, or gallops Lungs: Coarse breath sounds diffusely Abdomen: soft, non-tender, no masses or organomegaly palpable, normoactive bowel sounds Skin: warm, dry, no rashes or lesions, cap refill < 2 seconds Extremities: warm and well perfused, normal tone  LABS  PULMONARY Recent Labs  Lab 09/17/17 0305 09/18/17 2230 09/19/17 0305  PHART 7.438 7.535* 7.509*  PCO2ART 53.8* 31.8* 35.0  PO2ART 87.5 180* 143*  HCO3 35.8* 26.8 27.5  O2SAT 96.7 99.2 98.7   CBC Recent Labs  Lab 09/21/17 0356 09/22/17 0411 09/23/17 0221  HGB 12.1 11.1* 12.2  HCT 36.8 33.9* 37.0  WBC 20.4* 18.5* 15.7*  PLT 294 285 319   COAGULATION Recent Labs  Lab 09/17/17 0834  INR 0.92   CARDIAC No results for input(s): TROPONINI in the last 168 hours. No results for input(s): PROBNP in the last 168 hours.  CHEMISTRY Recent Labs  Lab 09/19/17 0440 09/19/17 1945 09/20/17 0427 09/21/17 0357 09/22/17 0411 09/23/17 0221  NA 139 138 138 138 139 139  K 2.9* 3.7 4.0 3.3* 3.3* 3.6  CL 103 103 100* 100* 105 105  CO2 29 28 28 30 27 28   GLUCOSE 95 101* 99 103* 111* 103*  BUN 17 15 21* 18 18 21*  CREATININE 0.48 0.52 0.52 0.49 0.49 0.55  CALCIUM 8.6* 8.8* 8.7* 9.2 8.5* 8.7*  MG 1.8  --  2.0 1.9 2.0 2.2  PHOS 3.1  --  3.9 3.5 3.6 3.9   Estimated Creatinine Clearance: 70.6 mL/min (by C-G formula based on SCr of 0.55 mg/dL).  LIVER Recent Labs  Lab 09/17/17 0834 09/21/17 0357 09/22/17 0411 09/23/17 0221  ALBUMIN  --  2.8* 2.3* 2.5*  INR 0.92  --   --   --    INFECTIOUS No results for input(s): LATICACIDVEN, PROCALCITON in the last 168 hours. ENDOCRINE CBG (last 3)  Recent Labs    09/22/17 2003 09/22/17 2357 09/23/17 0353  GLUCAP 112* 96 104*   IMAGING x48h  - image(s) personally visualized  -   highlighted in bold No results found.   ASSESSMENT and PLAN  PULM Acute on chronic hypercarbic respiratory failure, COPD: improving on TC Tobacco use  disorder - Trach collar as tolerated - Prednisone to 30 mg daily - Continue duo nebs every 6 hours, Pulmicort twice daily, Brovana twice daily - Completed doxycycline 10/26-10/31 - Continuing nicotine patch daily  CARDIAC HFpEF (LVEF 55%), BNP 50.8 > 201.1 Primary hypertension Hypotension, off Neo gtt since 11/4 Sinus tachycardia - Telemetry - Holding home lisinopril, lopressor - Lovenox SQ  RENAL AKI resolved Hypokalemia, resolved - Replace electrolyte as indicated - Hold lasix - KVO IVF  GI Nutrition - TF - Protonix PO for SUP - Miralax BID, Colace 100 mg BID, Senna BID - Robitussin 200 mg TID  NEURO Acute encephalopathy Agitation, PRN meds not given in last 48 hours - Haldol IV PRN and limiting Versed/fentanyl while on TC - Haldol IV 5 mg Q6H, Seroquel 50 mg per tube BID - RASS goal -1 - Daily thiamine - Restraints for medical non-violent  ENDOCRINE Hyperthyroidism, TSH 0.158, FT4 1.18 - Will need follow-up  HEME Leukocytosis, afebrile, likely steroid related - Monitor WBC/fevers  INFECTIOUS Haemophilus tracheobronchitis/pneumonia: found on resp cx 11/4, s/p doxy - Augmentin day 2  FAMILY  -  Brother updated at bedside as of 11/8 with plans to transfer to [place], [place]

## 2017-09-24 ENCOUNTER — Other Ambulatory Visit: Payer: Self-pay

## 2017-09-24 LAB — RENAL FUNCTION PANEL
ALBUMIN: 2.4 g/dL — AB (ref 3.5–5.0)
ANION GAP: 8 (ref 5–15)
BUN: 22 mg/dL — ABNORMAL HIGH (ref 6–20)
CALCIUM: 8.7 mg/dL — AB (ref 8.9–10.3)
CO2: 23 mmol/L (ref 22–32)
CREATININE: 0.47 mg/dL (ref 0.44–1.00)
Chloride: 107 mmol/L (ref 101–111)
GFR calc non Af Amer: >60 mL/min (ref >60)
GLUCOSE: 115 mg/dL — AB (ref 65–99)
PHOSPHORUS: 4.1 mg/dL (ref 2.5–4.6)
Potassium: 3.3 mmol/L — ABNORMAL LOW (ref 3.5–5.1)
SODIUM: 138 mmol/L (ref 135–145)

## 2017-09-24 LAB — GLUCOSE, CAPILLARY
GLUCOSE-CAPILLARY: 111 mg/dL — AB (ref 65–99)
Glucose-Capillary: 104 mg/dL — ABNORMAL HIGH (ref 65–99)
Glucose-Capillary: 111 mg/dL — ABNORMAL HIGH (ref 65–99)
Glucose-Capillary: 144 mg/dL — ABNORMAL HIGH (ref 65–99)
Glucose-Capillary: 90 mg/dL (ref 65–99)
Glucose-Capillary: 92 mg/dL (ref 65–99)

## 2017-09-24 LAB — CBC WITH DIFFERENTIAL/PLATELET
BASOS ABS: 0 10*3/uL (ref 0.0–0.1)
Basophils Relative: 0 %
EOS ABS: 0.3 10*3/uL (ref 0.0–0.7)
Eosinophils Relative: 2 %
HEMATOCRIT: 33.3 % — AB (ref 36.0–46.0)
HEMOGLOBIN: 10.9 g/dL — AB (ref 12.0–15.0)
LYMPHS PCT: 25 %
Lymphs Abs: 3.5 10*3/uL (ref 0.7–4.0)
MCH: 28.7 pg (ref 26.0–34.0)
MCHC: 32.7 g/dL (ref 30.0–36.0)
MCV: 87.6 fL (ref 78.0–100.0)
MONOS PCT: 7 %
Monocytes Absolute: 1 10*3/uL (ref 0.1–1.0)
Neutro Abs: 9.3 10*3/uL — ABNORMAL HIGH (ref 1.7–7.7)
Neutrophils Relative %: 66 %
Platelets: 291 10*3/uL (ref 150–400)
RBC: 3.8 MIL/uL — AB (ref 3.87–5.11)
RDW: 14.4 % (ref 11.5–15.5)
WBC: 14.1 10*3/uL — AB (ref 4.0–10.5)

## 2017-09-24 LAB — MAGNESIUM: MAGNESIUM: 1.9 mg/dL (ref 1.7–2.4)

## 2017-09-24 MED ORDER — PREDNISONE 5 MG/ML PO CONC
20.0000 mg | Freq: Every day | ORAL | Status: AC
  Administered 2017-09-25 – 2017-09-27 (×3): 20 mg
  Filled 2017-09-24 (×3): qty 4

## 2017-09-24 MED ORDER — POTASSIUM CHLORIDE 20 MEQ/15ML (10%) PO SOLN
20.0000 meq | ORAL | Status: AC
  Administered 2017-09-24 (×2): 20 meq
  Filled 2017-09-24 (×2): qty 15

## 2017-09-24 MED ORDER — IPRATROPIUM-ALBUTEROL 0.5-2.5 (3) MG/3ML IN SOLN
3.0000 mL | Freq: Four times a day (QID) | RESPIRATORY_TRACT | Status: DC
  Administered 2017-09-24 – 2017-09-25 (×4): 3 mL via RESPIRATORY_TRACT
  Filled 2017-09-24 (×4): qty 3

## 2017-09-24 MED ORDER — PREDNISONE 5 MG/ML PO CONC
10.0000 mg | Freq: Every day | ORAL | Status: DC
  Administered 2017-09-28: 10 mg
  Filled 2017-09-24: qty 2

## 2017-09-24 NOTE — Progress Notes (Signed)
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  She also has a 30-pack-year history of smoking and continues to smoke.  She been living with her sister and making progress for rehabilitation standpoint.  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  She took her rescue bronchodilators without much relief because of this she went to the emergency room.  She was given bronchodilator therapy, and sent home with a prescription of prednisone.  Her symptoms did not get better, she returned to the emergency room on the 24th with resting shortness of breath, and almost continuous use of her rescue bronchodilator without relief.  Her initial chest x-ray was without infiltrate or evidence of edema, she had no sick exposures, no fever or chills.  She was admitted with a working diagnosis of acute exacerbation chronic obstructive pulmonary disease, and treated appropriately with supplemental oxygen, inhaled bronchodilators, and systemic steroids.  On the evening of 10/25 she developed acute distress, was found by nursing staff tripod in bed.  Was treated with IV Lasix, pulse IV steroid, and placed on noninvasive positive pressure ventilation.  Throughout the course of the evening and going into the a.m. hours of 10/26.  Her work of breathing has remained quite elevated.  She has been on and off BiPAP, continues to have cough, continues to wheeze, has not been getting much relief from rescue bronchodilation.  Critical care was asked to  see because of work of breathing, noninvasive positive pressure ventilation requirements, and concern about clinical deterioration.   EVENTS 10/24 admit 10/26 intubated 11/2 tracheotomy placed  SUBJECTIVE/OVERNIGHT/INTERVAL HX 10/28 Unable to wean due to agitation and persistent bronchospasm. 10/29 agitation overnight, also with bradycardia down to 30s so precedex and propofol held.  Nose just dictating his name She was noted to have pauses on cardiac monitoring which may be consistent with heart block. 10/31 no acute events overnight. Patient sedated on fentanyl versed and propofol. 11/1 no acute events, remains sedated on fentanyl, versed, and propofol 11/2 hypotension overnight improved with d/c prop and initiation of bolus followed by change in mentation improved with restarting prop. Tachycardic event resolved with advancement of ETT. Plan to trach. Improved aeration of R-lung base on CXR with stable tube placement, mild L-base atelectasis remains 11/3 failed weaning this AM due to lack of effort 11/4 TC x15 minutes so far, no events overnight 11/5 Full vent support overnight with wean on TC, remains altered overnight despite methadone 11/6 Remained on TC overnight. Required soft restraints for agitation.  11/7 Decreasing prednisone, stable on TC, started Augmentin for haemophilus tracheobronchitis/pneumonia 11/8 Remains stable on TC, afebrile on Augmentin with improved WBC 11/9 fell out of bed last night.  Otherwise no acute distress no issues  VITAL SIGNS: BP 118/74   Pulse (!) 103   Temp 99.4 F (37.4 C) (Oral)   Resp (!) 22   Ht 5\' 9"  (1.753 m)   Wt 119 lb 7.8 oz (54.2 kg)   LMP 09/09/2014 (Approximate)   SpO2 99%   BMI 17.65 kg/m   HEMODYNAMICS:  VENTILATOR SETTINGS: FiO2 (%):  [28 %] 28 %  INTAKE / OUTPUT: I/O last 3 completed shifts: In: 2205 [NG/GT:2205] Out: 1375 [Urine:1000; Stool:375]   EXAM: General: 51 year old female currently tracheostomy  dependent after prolonged critical illness she is sitting up in bed, interactive, and in no acute distress HEENT: #6 cuffed tracheostomy currently midline, minimal tracheal discharge, strong cough with yellow tinged mucus Pulmonary: Scattered rhonchi, no accessory muscle use, Cardiac: Regular rate and rhythm without murmur rub or gallop Abdomen: Soft nontender no organomegaly Extremities/musculoskeletal: Warm dry brisk cap refill no edema strong pulses Neuro/psych: Awake, alert, follows commands, interactive, moves all extremities.  Remains impulsive.  LABS  PULMONARY Recent Labs  Lab 09/18/17 2230 09/19/17 0305  PHART 7.535* 7.509*  PCO2ART 31.8* 35.0  PO2ART 180* 143*  HCO3 26.8 27.5  O2SAT 99.2 98.7   CBC Recent Labs  Lab 09/22/17 0411 09/23/17 0221 09/24/17 0237  HGB 11.1* 12.2 10.9*  HCT 33.9* 37.0 33.3*  WBC 18.5* 15.7* 14.1*  PLT 285 319 291   COAGULATION No results for input(s): INR in the last 168 hours. CARDIAC No results for input(s): TROPONINI in the last 168 hours. No results for input(s): PROBNP in the last 168 hours.  CHEMISTRY Recent Labs  Lab 09/20/17 0427 09/21/17 0357 09/22/17 0411 09/23/17 0221 09/24/17 0237  NA 138 138 139 139 138  K 4.0 3.3* 3.3* 3.6 3.3*  CL 100* 100* 105 105 107  CO2 28 30 27 28 23   GLUCOSE 99 103* 111* 103* 115*  BUN 21* 18 18 21* 22*  CREATININE 0.52 0.49 0.49 0.55 0.47  CALCIUM 8.7* 9.2 8.5* 8.7* 8.7*  MG 2.0 1.9 2.0 2.2 1.9  PHOS 3.9 3.5 3.6 3.9 4.1   Estimated Creatinine Clearance: 69.6 mL/min (by C-G formula based on SCr of 0.47 mg/dL).  LIVER Recent Labs  Lab 09/21/17 0357 09/22/17 0411 09/23/17 0221 09/24/17 0237  ALBUMIN 2.8* 2.3* 2.5* 2.4*   INFECTIOUS No results for input(s): LATICACIDVEN, PROCALCITON in the last 168 hours. ENDOCRINE CBG (last 3)  Recent Labs    09/23/17 2335 09/24/17 0322 09/24/17 0734  GLUCAP 98 111* 111*   IMAGING x48h  - image(s) personally visualized  -    highlighted in bold Ct Head Wo Contrast  Result Date: 09/23/2017 CLINICAL DATA:  Followup head injury. EXAM: CT HEAD WITHOUT CONTRAST TECHNIQUE: Contiguous axial images were obtained from the base of the skull through the vertex without intravenous contrast. COMPARISON:  CT HEAD September 16, 2017 FINDINGS: BRAIN: No intraparenchymal hemorrhage, mass effect nor midline shift. The ventricles and sulci are normal. Scattered supratentorial white matter hypodensities. No acute large vascular territory infarcts. No abnormal extra-axial fluid collections. Basal cisterns are patent. VASCULAR: Unremarkable. SKULL/SOFT TISSUES: No skull fracture. No significant soft tissue swelling. ORBITS/SINUSES: The included ocular globes and orbital contents are normal.The mastoid aircells and included paranasal sinuses are well-aerated. OTHER: RIGHT nasogastric tube. IMPRESSION: 1. No acute intracranial process. 2. Minimal chronic small vessel ischemic disease. Electronically Signed   By: Elon Alas M.D.   On: 09/23/2017 21:36     ASSESSMENT and PLAN  Acute on chronic hypercarbic respiratory failure, COPD: improving on TC Tobacco use disorder HFpEF (LVEF 55%), BNP 50.8 > 201.1 Primary hypertension Hypotension, off Neo gtt since 11/4 Sinus tachycardia AKI resolved Hypokalemia, resolved Nutrition Acute encephalopathy Hyperthyroidism, TSH 0.158, FT4 1.18 Leukocytosis Haemophilus tracheobronchitis/pneumonia: found on resp cx 11/4  Pulmonary problem list: Acute on chronic hypercarbic respiratory failure in the setting of acute exacerbation of chronic  obstructive pulmonary disease, and Haemophilus influenza tracheobronchitis/pneumonia 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  I suspect we should be  able to transition quite quickly to cuffless tracheostomy early next week and if she continues current trend possibly formal capping trials in hopes of the cannulating her soon  Plan: Day #3 of 7 Augmentin for Haemophilus influenza Speech therapy consulted, need to initiate Passy-Muir valve trial, also think swallow evaluation would be appropriate at this point Change Haldol to as needed, continue Seroquel 50 mg via tube twice a day Continue slow prednisone taper, she is currently on 30 mg daily, will complete this today then decrease by 10 mg every 3 days until off Continue bronchodilators get out of bed Ensure physical therapy Appropriate for transfer to stepdown unit  We will see again on Monday the 19th.  My hope at that point is that we can initiate capping trials if she continues to progress  Erick Colace ACNP-BC [place] Pager # [phone] OR # [phone] if no answer

## 2017-09-24 NOTE — Progress Notes (Signed)
Transfer Note Brief Summary: Ms. Caitlin [name] is a 51 year old female with PMH of suspected COPD, stress induced cardiomyopathy, and tobacco use who was admitted on 09/08/17 for acute hypoxic respiratory failure. She did not respond to initial steroids, bronchodilators, or NIPPV and was subsequently intubated and transferred to the ICU on 10/25. She had significant agitation and encephalopathy and eventually underwent tracheostomy on 11/2. Tracheal aspirate from 11/2 bronchoscopy grew moderate Haemophilus influenzae (beta lactamase negative). Significant admission history as outlined per Dr. Ammie Dalton note listed below: MICROBIOLOGY: MRSA PCR 10/26:  Negative Urine Streptococcal Antigen 10/27:  Negative  Urine Legionella Antigen 10/27:  Negative  Respiratory Panel PCR 10/26:  Negative  Tracheal Aspirate Culture 11/4:  Moderate Haemophilus influenzae (beta lactamase negative)  ANTIBIOTICS: Doxycycline 10/26 - 10/31 Augmentin 11/7 >>>  SIGNIFICANT EVENTS: 10/24 - Admit 10/26 - PCCM consulted w/ failed improvement on NIPPV >> intubated 10/29 - Bradycardia overnight on Precedex and Propofol infusions  11/01 - Klonopin 2mg  TID started 11/02 - Hypotensive overnight >> bolus IVF & held propofol drip temporarily. Seroquel BID started.  11/03 - Lack of patient effort on weaning trial 11/04 - Methadone 5mg  q8hr started. Weaned off Neo-Synephrine in AM. 11/05 - Tolerated extended periods of T-collar. Scheduled Haldol IV q6hr added to regimen. Patient off Versed drip. 11/06 - Pt off Fentanyl drip. Klonopin discontinued.  11/07 - More calm.  11/08 - Tapering Seroquel.    Subjective:  Patient had a fall out of bed last night after soft restraints were discontinued. A STAT head CT was obtained which did not show any intracranial hemorrhage or acute pathology. Patient somnolent this morning but nods her head yes/no to questions. She denies pain and trouble breathing, nods yes when asked if she is tired. She  remembers falling out of bed last night. She denies any headache.  Objective:  Vital signs in last 24 hours: Vitals:   09/24/17 0800 09/24/17 0813 09/24/17 0814 09/24/17 0815  BP: 118/74 118/74    Pulse: (!) 106 (!) 103    Resp: (!) 21 (!) 22    Temp:      TempSrc:      SpO2: 98% 97% 97% 99%  Weight:      Height:       General: resting in bed, somnolent HEENT: tracheostomy collar, NG tube in place Cardiac: RRR Pulm: rhonchi upper airways Abd: soft, nontender, nondistended GU: foley/rectal tube in place Ext: warm and well perfused, no pedal edema, b/l wrist restraints and waist belt in place Neuro: somnolent, nods head yes/no to questions   Assessment/Plan:  Principal Problem:   COPD exacerbation (HCC) Active Problems:   Acute combined systolic and diastolic heart failure (HCC)   Acute on chronic respiratory failure (HCC)   Shortness of breath   AKI (acute kidney injury) ([place])   Encounter for care related to feeding tube   No contraindication to deep vein thrombosis (DVT) prophylaxis   At risk for stress ulcer   Acute respiratory failure with hypoxia (HCC)   Hypotension   SVT (supraventricular tachycardia) ([place])  51 year old female with PMH of suspected COPD, stress induced cardiomyopathy, and tobacco use admitted for acute on chronic hypercarbic respiratory failure with hypoxia and encephalopathy.  Acute on Chronic Hypercarbic/Hypoxic Respiratory Failure s/p Tracheostomy: Thought secondary to suspected COPD from chronic tobacco use as well as Haemophilus influenza tracheobronchitis/pneumonia. She is off mechanical ventilation and tolerating a trach collar. She has been started on Augmentin and is on a slow Prednisone taper.  -  Continue Augmentin via NG tube for 7 days (11/7-11/14) - Continue Prednisone taper 30 mg today then 20 mg x 3 days then 10 mg x 3 days - Continue Brovana, Duonebs, Pulmicort, prn Albuterol - SLP consulted for Passy-Muir valve trials and swallow  eval - PCCM following for Trach management; possible transition to cuffless next week  Acute Encephalopathy/Agitation: Unclear cause apart from acute illness. There was question of withdrawal from unknown substance per nursing however this is not confirmed. [place] Controlled Substance database does not show any prescriptions for narcotics or benzodiapines in the last 2 years. She has been on scheduled IV Haldol with prn IV Haldol as well. Seroquel is being tapered down. She continues to require soft restraints. - Agree with discontinuing scheduled IV Haldol; continue prn IV Haldol for now - Seroquel 50 mg BID; tapered down from 100 mg BID (11/8) - Continue soft restraints for now; d/c as soon as safely able  Cardiomyopathy: Admitted 06/2017 for suspected stress-induced cardiomyopathy. Repeat TTE this admission shows improved EF of 55-60% (45% prior) with normal wall motion. She appears euvolemic on exam although she did appear euvolemic on prior admission. - Strict I/Os, Daily weights - Holding home Toprol-XL and Lasix with intermittent hypotension  HTN: Hypotensive to normotensive now. Holding home Lisinopril, Toprol-XL, and Lasix for now.  FEN: Monitor BMET, UOP - K 3.3; repletion ordered - Tube feeds via NGT - SLP eval - SSI-S while on steroids   Dispo: Anticipated discharge pending clinical improvement.   Zada Finders, MD 09/24/2017, 10:07 AM

## 2017-09-24 NOTE — Progress Notes (Signed)
CSW spoke with Caitlin [name] to see if she was able to speak with pt's family. Caitlin [name] informed CSW that she was unable to get in contact with family and asked that CSW try again. CSW reached out to [name] and left VM on the phone asking that he call either CSW or [place] back. Per Caitlin [name] she is hoping to meet with pt's family on Tuesday of next week at [place] explained to [name] that per doctor, pt may not need vent/snf but LTACH instead and Caitlin [name] assured CSW that if this was the case then she could cancel the paperwork and it would be fine. CSW still continues to follow for discharge needs.     [name] [name], MSW, [facility] Clinical Social Worker [phone]

## 2017-09-24 NOTE — Progress Notes (Signed)
Pt seen by trach consult team.  No education needed at this time. Pt on trach collar.  All equipment is at beside.  Will continue to follow for progression.

## 2017-09-24 NOTE — Progress Notes (Signed)
[facility] ADULT ICU REPLACEMENT PROTOCOL FOR AM LAB REPLACEMENT ONLY  The patient does apply for the [facility] Adult ICU Electrolyte Replacment Protocol based on the criteria listed below:   1. Is GFR >/= 40 ml/min? Yes.    Patient's GFR today is >60 2. Is urine output >/= 0.5 ml/kg/hr for the last 6 hours? Yes.   Patient's UOP is 1.0 ml/kg/hr 3. Is BUN < 60 mg/dL? Yes.    Patient's BUN today is 22 4. Abnormal electrolyte(s):K3.3 5. Ordered repletion with: per protocol 6. If a panic level lab has been reported, has the CCM MD in charge been notified? Yes.  .   Physician:  Patricia Nettle, MD  [place] Clock 09/24/2017 5:30 AM

## 2017-09-24 NOTE — Evaluation (Signed)
Passy-Muir Speaking Valve - Evaluation Patient Details  Name: Caitlin [name] MRN: [id number] Date of Birth: 23-May-1966  Today's Date: 09/24/2017 Time: 1140-1205 SLP Time Calculation (min) (ACUTE ONLY): 25 min  Past Medical History:  Past Medical History:  Diagnosis Date  . CHF (congestive heart failure) ([place])   . COPD (chronic obstructive pulmonary disease) ([place])   . NSTEMI (non-ST elevated myocardial infarction) [facility])    Past Surgical History: History reviewed. No pertinent surgical history. HPI:  Ms. Caitlin [name] is a 51 year old female with PMH of suspected COPD, stress induced cardiomyopathy, and tobacco use who was admitted on 09/08/17 for acute hypoxic respiratory failure. She did not respond to initial steroids, bronchodilators, or NIPPV and was subsequently intubated and transferred to the ICU on 10/25. She had significant agitation and encephalopathy and eventually underwent tracheostomy on 11/2. Tracheal aspirate from 11/2 bronchoscopy grew moderate Haemophilus influenzae (beta lactamase negative).    Assessment / Plan / Recommendation Clinical Impression  Pt demosntrates excellent tolerance of brief PMSV placement. Pt significantly lethargic during assessment, but able to follow commands and participate in conversation with eyes closed. Shiley 6 cuffed trach in place, cuff deflated without significant response from pt. PMSV placed with immediate return of air to upper airway, phonation, and eventual conversation. Pt vocal quality is low and breath support is impaired, but pt is intelligible and appropriate. No signs of air trapping or change in vital signs over 10 minutes, PMSV removed when breathing treatment strated with RT. Discussed plan for pt to wear PMSV with full supervision as much as possible. WIll f/u for possible swallow assessment when more alert.  SLP Visit Diagnosis: Aphonia (R49.1)    SLP Assessment  Patient needs continued Speech Lanaguage Pathology Services    Follow Up  Recommendations  Inpatient Rehab    Frequency and Duration min 2x/week  2 weeks    PMSV Trial PMSV was placed for: 15 mintues Able to redirect subglottic air through upper airway: Yes Able to Attain Phonation: Yes Voice Quality: Low vocal intensity Able to Expectorate Secretions: No attempts Breath Support for Phonation: Mildly decreased Intelligibility: Intelligible Respirations During Trial: 20 SpO2 During Trial: 98 % Behavior: Lethargic   Tracheostomy Tube  Additional Tracheostomy Tube Assessment Trach Collar Period: all waking hours Secretion Description: none observed    Vent Dependency  Vent Dependent: No FiO2 (%): 28 %    Cuff Deflation Trial  GO Tolerated Cuff Deflation: Yes Length of Time for Cuff Deflation Trial: 15 minutes Behavior: Responsive to questions        Tiler Brandis, Katherene Ponto 09/24/2017, 2:40 PM

## 2017-09-24 NOTE — Progress Notes (Signed)
Patient's trach sutures were removed without any complications.

## 2017-09-24 NOTE — Progress Notes (Signed)
Inpatient Rehabilitation  Per SLP request, patient was screened by Gunnar Fusi for appropriateness for an Inpatient Acute Rehab consult.  At this time we will await PT and OT recommendations prior to requesting an Inpatient Rehab consult.  Call if questions.   Carmelia Roller., CCC/SLP Admission Coordinator  [facility]  Cell [phone]

## 2017-09-25 LAB — GLUCOSE, CAPILLARY
GLUCOSE-CAPILLARY: 105 mg/dL — AB (ref 65–99)
GLUCOSE-CAPILLARY: 104 mg/dL — AB (ref 65–99)
Glucose-Capillary: 104 mg/dL — ABNORMAL HIGH (ref 65–99)
Glucose-Capillary: 95 mg/dL (ref 65–99)
Glucose-Capillary: 112 mg/dL — ABNORMAL HIGH (ref 65–99)

## 2017-09-25 LAB — BASIC METABOLIC PANEL
ANION GAP: 6 (ref 5–15)
BUN: 22 mg/dL — ABNORMAL HIGH (ref 6–20)
CHLORIDE: 107 mmol/L (ref 101–111)
CO2: 24 mmol/L (ref 22–32)
Calcium: 8.9 mg/dL (ref 8.9–10.3)
Creatinine, Ser: 0.5 mg/dL (ref 0.44–1.00)
GFR calc Af Amer: >60 mL/min (ref >60)
GLUCOSE: 107 mg/dL — AB (ref 65–99)
POTASSIUM: 4.1 mmol/L (ref 3.5–5.1)
Sodium: 137 mmol/L (ref 135–145)

## 2017-09-25 MED ORDER — IPRATROPIUM-ALBUTEROL 0.5-2.5 (3) MG/3ML IN SOLN
3.0000 mL | RESPIRATORY_TRACT | Status: DC | PRN
  Administered 2017-09-27 (×3): 3 mL via RESPIRATORY_TRACT
  Filled 2017-09-25 (×3): qty 3

## 2017-09-25 MED ORDER — QUETIAPINE FUMARATE 25 MG PO TABS
25.0000 mg | ORAL_TABLET | Freq: Two times a day (BID) | ORAL | Status: DC
  Filled 2017-09-25: qty 1

## 2017-09-25 MED ORDER — PHENOL 1.4 % MT LIQD
1.0000 | OROMUCOSAL | Status: DC | PRN
  Administered 2017-09-25 – 2017-09-27 (×2): 1 via OROMUCOSAL
  Filled 2017-09-25: qty 177

## 2017-09-25 NOTE — Progress Notes (Signed)
Patient admitted to 5W-07 around 1530. Trach in place with equipment set up at bedside. Feeding tube in place and infusing. Patient alert and oriented and able to verbalize needs when PM valve is in place. Contacted MD, says droplet precautions not needed at this time. Oriented to unit and shown how to use call light. Family at bedside. Will continue to monitor and treat per MD orders.

## 2017-09-25 NOTE — Progress Notes (Signed)
Subjective:  No acute events overnight.  Patient's mental status today significantly improved.  She is alert and awake, and following commands.  Did not require any as needed medications.  Answers questions appropriately with the help of speaking valve.  Reports having a sore throat but otherwise no complaints this morning.  Will transfer out of stepdown.  Objective:  Vital signs in last 24 hours: Vitals:   09/25/17 0500 09/25/17 0600 09/25/17 0754 09/25/17 0819  BP:  112/84    Pulse:  (!) 29    Resp:  (!) 22    Temp:   98.8 F (37.1 C)   TempSrc:   Oral   SpO2:  100%  99%  Weight: 117 lb 8.1 oz (53.3 kg)     Height:       General: Very pleasant female, chronically ill-appearing, thin, well-developed, sitting up in bed in no acute distress Cardiac: Tachycardic with a regular rhythm, nl S1/S2, no murmurs, rubs or gallops  Pulm: There is diffuse wheezing on exam, but improved from yesterday.  Currently on trach collar and tolerating it well.  Satting >95% on room air.  No increased work of breathing noted Abd: soft, NTND, bowel sounds present Neuro: A&Ox3, able to move all 4 extremities Ext: warm and well perfused, no peripheral edema    Assessment/Plan:  Principal Problem:   COPD exacerbation (HCC) Active Problems:   Acute combined systolic and diastolic heart failure (HCC)   Acute on chronic respiratory failure (HCC)   Shortness of breath   AKI (acute kidney injury) ([place])   Encounter for care related to feeding tube   No contraindication to deep vein thrombosis (DVT) prophylaxis   At risk for stress ulcer   Acute respiratory failure with hypoxia (HCC)   Hypotension   SVT (supraventricular tachycardia) ([place])  51 year old female with PMH of suspected COPD, stress induced cardiomyopathy, and tobacco use admitted for acute on chronic hypercarbic respiratory failure with hypoxia and encephalopathy.  # Acute on Chronic Hypercarbic/Hypoxic Respiratory Failure s/p  Tracheostomy: Thought secondary to suspected COPD from chronic tobacco use as well as Haemophilus influenza tracheobronchitis/PNA. She is off mechanical ventilation and tolerating a trach collar. She has been started on Augmentin and is on a slow Prednisone taper.  Currently satting well on room air. - Continue Augmentin via NG tube for 7 days (11/7-11/14)  - Continue Prednisone taper 20 mg x 3 days (11/10-11/12) then 10 mg x 3 days (11/13-11/15) - Continue Brovana, Duonebs, Pulmicort, prn Albuterol` - PCCM following for Trach management; possible transition to cuffless next week - PT/OT consult  - Speech recommended CIR, pending PT and OT eval to determine disposition  # Acute Encephalopathy/Agitation: Resolved. Unclear cause apart from acute illness.  No history of substance abuse.  No electrolyte abnormalities. She has been on scheduled IV Haldol with prn IV Haldol as well and Seroquel, which is being tapered down. She required soft restraints as well.  Appears alert and awake today.  Responding to questions appropriately and following commands.  Will discontinue all as needed medications for agitation given resolution of encephalopathy.  Will also discontinue restraints.  PT/OT consult placed.   - Transfer to floor  - D/c PRN Haldol given appropriate mentation today  - D/c Seroquel as she had only received it for 6 days - D/c soft restraints as patient is now alert and awake and able to follow commands - PT/OT consult   # Cardiomyopathy: Admitted 06/2017 for suspected stress-induced cardiomyopathy. Repeat TTE  this admission shows improved EF of 55-60% (45% prior) with normal wall motion. She appears euvolemic on exam although she did appear euvolemic on prior admission. - Strict I/Os, Daily weights - Holding home Toprol-XL and Lasix with intermittent hypotension  # HTN: Currently normotensive now.  - Holding home Lisinopril, Toprol-XL, and Lasix for now.  F: none E: Continue to monitor  and replete as needed N: Tube feeds via NGT  VTE ppx: SQ Lovenox   Code status: Full code    Dispo: Anticipated discharge in approximately 4-5 day(s) pending continued improvement in mental status and PT/OT evaluation for disposition.   [name], MD  Internal Medicine PGY-1  P [phone]

## 2017-09-25 NOTE — Evaluation (Signed)
Clinical/Bedside Swallow Evaluation Patient Details  Name: Caitlin [name] MRN: [id number] Date of Birth: [birth year]  Today's Date: 09/25/2017 Time: SLP Start Time (ACUTE ONLY): 0957 SLP Stop Time (ACUTE ONLY): 1002 SLP Time Calculation (min) (ACUTE ONLY): 5 min  Past Medical History:  Past Medical History:  Diagnosis Date  . CHF (congestive heart failure) ([place])   . COPD (chronic obstructive pulmonary disease) ([place])   . NSTEMI (non-ST elevated myocardial infarction) [facility])    Past Surgical History: History reviewed. No pertinent surgical history. HPI:  Ms. Caitlin [name] is a 51 year old female with PMH of suspected COPD, stress induced cardiomyopathy, and tobacco use who was admitted on 09/08/17 for acute hypoxic respiratory failure. She did not respond to initial steroids, bronchodilators, or NIPPV and was subsequently intubated and transferred to the ICU on 10/25. She had significant agitation and encephalopathy and eventually underwent tracheostomy on 11/2. Tracheal aspirate from 11/2 bronchoscopy grew moderate Haemophilus influenzae (beta lactamase negative).    Assessment / Plan / Recommendation Clinical Impression  Patient presents with subtle delayed cough with initial ice chip, but good tolerance in subsequent trials. Immediate cough with teaspoons thin liquids, suggestive of decreased airway protection. PMSV in place with good toleration in 25 minutes prior to assessment. Pt's swallow appears timely, hyolaryngeal elevation appears adequate to palaption. Given risk factors for aspiration including tracheostomy, recommend instrumental assessment prior to initiating diet. Will follow up next date for PO trials and to determine readiness for FEES (possibly Monday if appropriate). Pt may have a few ice chips PRN with PMSV in place after oral care to facilitate use of swallowing musculature. SLP Visit Diagnosis: Dysphagia, unspecified (R13.10)    Aspiration Risk  Moderate aspiration risk    Diet  Recommendation NPO;Ice chips PRN after oral care   Liquid Administration via: Spoon Medication Administration: Via alternative means Supervision: Full supervision/cueing for compensatory strategies    Other  Recommendations Oral Care Recommendations: Oral care QID   Follow up Recommendations Inpatient Rehab      Frequency and Duration min 2x/week  2 weeks       Prognosis Prognosis for Safe Diet Advancement: Good      Swallow Study   General Date of Onset: 09/08/17 HPI: Ms. Caitlin [name] is a 52 year old female with PMH of suspected COPD, stress induced cardiomyopathy, and tobacco use who was admitted on 09/08/17 for acute hypoxic respiratory failure. She did not respond to initial steroids, bronchodilators, or NIPPV and was subsequently intubated and transferred to the ICU on 10/25. She had significant agitation and encephalopathy and eventually underwent tracheostomy on 11/2. Tracheal aspirate from 11/2 bronchoscopy grew moderate Haemophilus influenzae (beta lactamase negative).  Type of Study: Bedside Swallow Evaluation Previous Swallow Assessment: none in chart Diet Prior to this Study: NPO Temperature Spikes Noted: No Respiratory Status: Trach Collar History of Recent Intubation: Yes Length of Intubations (days): 8 days Date extubated: (trach 11/2) Behavior/Cognition: Alert;Cooperative;Pleasant mood Oral Cavity Assessment: Within Functional Limits Oral Care Completed by SLP: Yes Oral Cavity - Dentition: Adequate natural dentition Vision: Functional for self-feeding Self-Feeding Abilities: Able to feed self Patient Positioning: Upright in bed Baseline Vocal Quality: Low vocal intensity Volitional Cough: Strong Volitional Swallow: Able to elicit    Oral/Motor/Sensory Function Overall Oral Motor/Sensory Function: Within functional limits   Ice Chips Ice chips: Impaired Pharyngeal Phase Impairments: Cough - Delayed   Thin Liquid Thin Liquid: Impaired Presentation:  Spoon Pharyngeal  Phase Impairments: Cough - Immediate    Nectar Thick Nectar Thick Liquid: Not  tested   Honey Thick Honey Thick Liquid: Not tested   Puree Puree: Not tested   Solid   GO   Solid: Not tested       Deneise Lever, [place], CCC-SLP Speech-Language Pathologist [phone]  Aliene Altes 09/25/2017,10:29 AM

## 2017-09-25 NOTE — Progress Notes (Signed)
  Speech Language Pathology Treatment: Caitlin [name] Speaking valve  Patient Details Name: Caitlin [name] MRN: [id number] DOB: 1966/01/25 Today's Date: 09/25/2017 Time: 8299-3716 SLP Time Calculation (min) (ACUTE ONLY): 25 min  Assessment / Plan / Recommendation Clinical Impression  Pt seen for PMSV treatment. RN reports pt tolerating valve well; wore valve for 6 hours yesterday. Pt is more alert today during treatment. She does report mild sore throat, states, "I think I did too much talking yesterday." Pt recently suctioned by RT; cuff deflated at baseline. With initial placement of PMSV, pt attains immediate redirection of air to upper airway, good quality but low phonation, breath support mildly reduced. After 1-2 minutes, respiratory rate elevated to mid 30s; pt coughed with valve displacement and tracheal expectoration of moderate amount of thin, yellow-clear secretions. PMSV replaced and pt demonstrated no further issues with toleration over a period of 25 minutes; vitals remained stable and respiratory rate in upper teens. In conversation pt with decay of phonation, vocal amplitude and pt noted to be speaking on residual capacity. SLP provided training in more frequent breaths to support phonation. Pt demo'd with rare min A in automatic speech tasks. Requires mod cues in conversation. Will proceed with swallow assessment. Please see evaluation for details.     HPI HPI: Caitlin [name] is a 51 year old female with PMH of suspected COPD, stress induced cardiomyopathy, and tobacco use who was admitted on 09/08/17 for acute hypoxic respiratory failure. She did not respond to initial steroids, bronchodilators, or NIPPV and was subsequently intubated and transferred to the ICU on 10/25. She had significant agitation and encephalopathy and eventually underwent tracheostomy on 11/2. Tracheal aspirate from 11/2 bronchoscopy grew moderate Haemophilus influenzae (beta lactamase negative).       SLP Plan  Continue  with current plan of care       Recommendations         Patient may use Passy-Muir Speech Valve: Intermittently with supervision;During all therapies with supervision PMSV Supervision: Full MD: Please consider changing trach tube to : Smaller size;Cuffless         Oral Care Recommendations: Oral care QID Follow up Recommendations: Inpatient Rehab SLP Visit Diagnosis: Aphonia (R49.1) Plan: Continue with current plan of care       [place], [place], [place] Speech-Language Pathologist [place] 09/25/2017, 10:06 AM

## 2017-09-26 DIAGNOSIS — I119 Hypertensive heart disease without heart failure: Secondary | ICD-10-CM

## 2017-09-26 DIAGNOSIS — Z8709 Personal history of other diseases of the respiratory system: Secondary | ICD-10-CM

## 2017-09-26 DIAGNOSIS — Z8669 Personal history of other diseases of the nervous system and sense organs: Secondary | ICD-10-CM

## 2017-09-26 DIAGNOSIS — R000 Tachycardia, unspecified: Secondary | ICD-10-CM

## 2017-09-26 DIAGNOSIS — Z79899 Other long term (current) drug therapy: Secondary | ICD-10-CM

## 2017-09-26 DIAGNOSIS — F17200 Nicotine dependence, unspecified, uncomplicated: Secondary | ICD-10-CM

## 2017-09-26 DIAGNOSIS — Z8701 Personal history of pneumonia (recurrent): Secondary | ICD-10-CM

## 2017-09-26 DIAGNOSIS — J9621 Acute and chronic respiratory failure with hypoxia: Principal | ICD-10-CM

## 2017-09-26 LAB — BASIC METABOLIC PANEL
Anion gap: 6 (ref 5–15)
BUN: 24 mg/dL — ABNORMAL HIGH (ref 6–20)
CALCIUM: 8.9 mg/dL (ref 8.9–10.3)
CO2: 24 mmol/L (ref 22–32)
CREATININE: 0.46 mg/dL (ref 0.44–1.00)
Chloride: 107 mmol/L (ref 101–111)
GFR calc Af Amer: >60 mL/min (ref >60)
GFR calc non Af Amer: >60 mL/min (ref >60)
Glucose, Bld: 106 mg/dL — ABNORMAL HIGH (ref 65–99)
Potassium: 3.6 mmol/L (ref 3.5–5.1)
SODIUM: 137 mmol/L (ref 135–145)

## 2017-09-26 LAB — GLUCOSE, CAPILLARY
GLUCOSE-CAPILLARY: 102 mg/dL — AB (ref 65–99)
GLUCOSE-CAPILLARY: 137 mg/dL — AB (ref 65–99)
GLUCOSE-CAPILLARY: 98 mg/dL (ref 65–99)
GLUCOSE-CAPILLARY: 94 mg/dL (ref 65–99)
Glucose-Capillary: 85 mg/dL (ref 65–99)
Glucose-Capillary: 95 mg/dL (ref 65–99)

## 2017-09-26 MED ORDER — KETOROLAC TROMETHAMINE 15 MG/ML IJ SOLN
15.0000 mg | Freq: Three times a day (TID) | INTRAMUSCULAR | Status: DC | PRN
  Administered 2017-09-26: 15 mg via INTRAVENOUS
  Filled 2017-09-26 (×2): qty 1

## 2017-09-26 MED ORDER — SPIRONOLACTONE 25 MG PO TABS
25.0000 mg | ORAL_TABLET | Freq: Every day | ORAL | Status: DC
  Administered 2017-09-26: 25 mg via ORAL
  Filled 2017-09-26: qty 1

## 2017-09-26 NOTE — Plan of Care (Signed)
  Acute Rehab OT Goals (only OT should resolve) Pt. Will Perform Grooming 09/26/2017 1556 by Hortencia Pilar, OT Flowsheets Taken 09/26/2017 1556  Pt Will Perform Grooming with modified independence;standing Pt. Will Perform Upper Body Bathing 09/26/2017 1556 by Hortencia Pilar, OT Flowsheets Taken 09/26/2017 1556  Pt Will Perform Upper Body Bathing with modified independence;sitting Pt. Will Perform Lower Body Bathing 09/26/2017 1556 by Hortencia Pilar, OT Flowsheets Taken 09/26/2017 1556  Pt Will Perform Lower Body Bathing with modified independence;sit to/from stand Pt. Will Perform Upper Body Dressing 09/26/2017 1556 by Hortencia Pilar, OT Flowsheets Taken 09/26/2017 1556  Pt Will Perform Upper Body Dressing with modified independence;sitting Pt. Will Perform Lower Body Dressing 09/26/2017 1556 by Hortencia Pilar, OT Flowsheets Taken 09/26/2017 1556  Pt Will Perform Lower Body Dressing with modified independence;sit to/from stand Pt. Will Transfer To Toilet 09/26/2017 1556 by Hortencia Pilar, OT Flowsheets Taken 09/26/2017 1556  Pt Will Transfer to Toilet with modified independence;ambulating Pt. Will Perform Toileting-Clothing Manipulation 09/26/2017 1556 by Hortencia Pilar, OT Flowsheets Taken 09/26/2017 1556  Pt Will Perform Toileting - Clothing Manipulation and hygiene with modified independence;sit to/from stand Pt. Will Perform Tub/Shower Transfer 09/26/2017 1556 by Hortencia Pilar, OT Flowsheets Taken 09/26/2017 1556  Pt Will Perform Tub/Shower Transfer with modified independence;ambulating;shower seat;rolling walker  Tyrone Schimke OTR/L Pager: [phone]

## 2017-09-26 NOTE — Evaluation (Signed)
Physical Therapy Evaluation Patient Details Name: Caitlin [name] MRN: [id number] DOB: 1966/02/03 Today's Date: 09/26/2017   History of Present Illness  Pt is a 51 yo female admitted through the ED on 09/08/17 with COPD exacerbation and acute respiratory failure. Pt had continuous bronchospasms and was intubated on 09/10/17 and trach was placed on 09/17/17. Pt has had a feeding tube placed since admission. PMH signficiant for CHF, ARD, AKI, hypotension, SVT and CHF.   Clinical Impression  Pt presents with the above diagnosis and below deficits for therapy evaluation. Prior to admission, pt was living with her sister and completely independent. Pt has had a significant decline during this hospital stay resulting in intubation and a tracheostomy placed. Pt is able to tolerate very short term sitting at EOB and is able to stand x 1 min before becoming fatigued and having to sit back down with HR increasing to 114. Pt will benefit from inpatient rehab at discharge in order to maximize her functional outcomes prior to discharge home. Pt continues to require continued acute PT services to address the below deficits prior to D/C.     Follow Up Recommendations CIR;Supervision for mobility/OOB    Equipment Recommendations  Rolling walker with 5" wheels;3in1 (PT)    Recommendations for Other Services Rehab consult     Precautions / Restrictions Precautions Precautions: Fall Restrictions Weight Bearing Restrictions: No      Mobility  Bed Mobility Overal bed mobility: Needs Assistance Bed Mobility: Supine to Sit;Sit to Supine     Supine to sit: Min guard Sit to supine: Min guard   General bed mobility comments: Min guard for safety with use of railing and HOB elevated to get into and out of bed.   Transfers Overall transfer level: Needs assistance Equipment used: Rolling walker (2 wheeled) Transfers: Sit to/from Stand Sit to Stand: Min assist         General transfer comment: Min A  for safety from EOB to RW  Ambulation/Gait             General Gait Details: Did not attempt this session  Stairs            Wheelchair Mobility    Modified Rankin (Stroke Patients Only)       Balance Overall balance assessment: Needs assistance Sitting-balance support: No upper extremity supported;Feet supported Sitting balance-Leahy Scale: Fair     Standing balance support: Bilateral upper extremity supported;During functional activity Standing balance-Leahy Scale: Poor Standing balance comment: requires RW for stability in standing                             Pertinent Vitals/Pain Pain Assessment: No/denies pain    Home Living Family/patient expects to be discharged to:: Inpatient rehab Living Arrangements: Other relatives Available Help at Discharge: Family;Available PRN/intermittently                  Prior Function Level of Independence: Independent         Comments: completely independent with all ADLs and IADLs prior to admission     Hand Dominance   Dominant Hand: Right    Extremity/Trunk Assessment   Upper Extremity Assessment Upper Extremity Assessment: Defer to OT evaluation    Lower Extremity Assessment Lower Extremity Assessment: Generalized weakness    Cervical / Trunk Assessment Cervical / Trunk Assessment: Normal  Communication   Communication: Tracheostomy  Cognition Arousal/Alertness: Awake/alert Behavior During Therapy: WFL for tasks assessed/performed Overall  Cognitive Status: Within Functional Limits for tasks assessed                                        General Comments General comments (skin integrity, edema, etc.): HR increases to 114 with standing activities    Exercises     Assessment/Plan    PT Assessment Patient needs continued PT services  PT Problem List Decreased strength;Decreased activity tolerance;Decreased balance;Decreased mobility;Decreased knowledge of use  of DME       PT Treatment Interventions DME instruction;Gait training;Functional mobility training;Therapeutic activities;Therapeutic exercise;Balance training    PT Goals (Current goals can be found in the Care Plan section)  Acute Rehab PT Goals Patient Stated Goal: to get back to moving better PT Goal Formulation: With patient Time For Goal Achievement: 10/03/17 Potential to Achieve Goals: Good    Frequency Min 3X/week   Barriers to discharge Decreased caregiver support      Co-evaluation               AM-PAC PT "6 Clicks" Daily Activity  Outcome Measure Difficulty turning over in bed (including adjusting bedclothes, sheets and blankets)?: None Difficulty moving from lying on back to sitting on the side of the bed? : Unable Difficulty sitting down on and standing up from a chair with arms (e.g., wheelchair, bedside commode, etc,.)?: A Lot Help needed moving to and from a bed to chair (including a wheelchair)?: Total Help needed walking in hospital room?: Total Help needed climbing 3-5 steps with a railing? : Total 6 Click Score: 10    End of Session Equipment Utilized During Treatment: Gait belt;Oxygen Activity Tolerance: Patient limited by fatigue Patient left: in bed;with call bell/phone within reach Nurse Communication: Mobility status PT Visit Diagnosis: Unsteadiness on feet (R26.81);Muscle weakness (generalized) (M62.81);Difficulty in walking, not elsewhere classified (R26.2)    Time: 3005-1102 PT Time Calculation (min) (ACUTE ONLY): 14 min   Charges:   PT Evaluation $PT Eval Moderate Complexity: 1 Mod     PT G Codes:        Scheryl Marten PT, DPT     Kenny Rea Sloan Leiter 09/26/2017, 11:30 AM

## 2017-09-26 NOTE — Evaluation (Signed)
Occupational Therapy Evaluation Patient Details Name: Caitlin [name] MRN: [id number] DOB: [birth year] Today's Date: 09/26/2017    History of Present Illness Pt is a 51 yo female admitted through the ED on 09/08/17 with COPD exacerbation and acute respiratory failure. Pt had continuous bronchospasms and was intubated on 09/10/17 and trach was placed on 09/17/17. Pt has had a feeding tube placed since admission. PMH signficiant for CHF, ARD, AKI, hypotension, SVT and CHF.    Clinical Impression   Pt admitted with the above diagnoses and presents with below problem list. Pt will benefit from continued acute OT to address the below listed deficits and maximize independence with basic ADLs prior to d/c to venue below. PTA pt was completely independent with ADLs and IADLs. Pt presents with generalized weakness and balance deficits significantly impacting current functional independence level.  Pt is currently min-mod a with ADLs. Did not assess functional mobility this session. Stand-pivot from EOB to recliner this session. Pt very motivated and tolerated session well.      Follow Up Recommendations  CIR    Equipment Recommendations  Other (comment)(defer to next venue)    Recommendations for Other Services Rehab consult     Precautions / Restrictions Precautions Precautions: Fall Restrictions Weight Bearing Restrictions: No      Mobility Bed Mobility Overal bed mobility: Needs Assistance Bed Mobility: Supine to Sit     Supine to sit: Min guard     General bed mobility comments: Min guard for safety with use of railing and HOB elevated to get into and out of bed.   Transfers Overall transfer level: Needs assistance Equipment used: Rolling walker (2 wheeled) Transfers: Sit to/from [facility] Sit to Stand: Min assist Stand pivot transfers: Min assist       General transfer comment: EOB>recliner. Decreased speed for age.     Balance Overall balance  assessment: Needs assistance Sitting-balance support: No upper extremity supported;Feet supported Sitting balance-Leahy Scale: Fair     Standing balance support: Bilateral upper extremity supported;During functional activity Standing balance-Leahy Scale: Poor Standing balance comment: requires RW for stability in standing                           ADL either performed or assessed with clinical judgement   ADL Overall ADL's : Needs assistance/impaired Eating/Feeding: NPO Eating/Feeding Details (indicate cue type and reason): NG tube Grooming: Minimal assistance;Sitting   Upper Body Bathing: Moderate assistance;Sitting   Lower Body Bathing: Moderate assistance   Upper Body Dressing : Minimal assistance;Sitting   Lower Body Dressing: Minimal assistance;Sit to/from stand   Toilet Transfer: Minimal assistance;Stand-pivot;RW   Toileting- Clothing Manipulation and Hygiene: Minimal assistance   Tub/ Shower Transfer: Minimal assistance;Stand-pivot;3 in 1;Rolling walker     General ADL Comments: Pt completed bed mobility and SPT EOB>recliner to simulate toilet transfer.      Vision         Perception     Praxis      Pertinent Vitals/Pain Pain Assessment: No/denies pain     Hand Dominance Right   Extremity/Trunk Assessment Upper Extremity Assessment Upper Extremity Assessment: Generalized weakness   Lower Extremity Assessment Lower Extremity Assessment: Defer to PT evaluation   Cervical / Trunk Assessment Cervical / Trunk Assessment: Normal   Communication Communication Communication: Tracheostomy   Cognition Arousal/Alertness: Awake/alert Behavior During Therapy: WFL for tasks assessed/performed Overall Cognitive Status: Within Functional Limits for tasks assessed  General Comments  PSMV in place for session but pt communicating nonverbally. Discussed afterwards with nursing.     Exercises      Shoulder Instructions      Home Living Family/patient expects to be discharged to:: Inpatient rehab Living Arrangements: Other relatives Available Help at Discharge: Family;Available PRN/intermittently                                    Prior Functioning/Environment Level of Independence: Independent        Comments: completely independent with all ADLs and IADLs prior to admission        OT Problem List: Decreased strength;Decreased activity tolerance;Impaired balance (sitting and/or standing);Decreased knowledge of use of DME or AE;Decreased knowledge of precautions;Cardiopulmonary status limiting activity;Pain      OT Treatment/Interventions: Self-care/ADL training;Therapeutic exercise;Energy conservation;DME and/or AE instruction;Therapeutic activities;Patient/family education;Balance training    OT Goals(Current goals can be found in the care plan section) Acute Rehab OT Goals Patient Stated Goal: to get back to moving better OT Goal Formulation: With patient Time For Goal Achievement: 10/10/17 Potential to Achieve Goals: Good ADL Goals Pt Will Perform Grooming: with modified independence;standing Pt Will Perform Upper Body Bathing: with modified independence;sitting Pt Will Perform Lower Body Bathing: with modified independence;sit to/from stand Pt Will Perform Upper Body Dressing: with modified independence;sitting Pt Will Perform Lower Body Dressing: with modified independence;sit to/from stand Pt Will Transfer to Toilet: with modified independence;ambulating Pt Will Perform Toileting - Clothing Manipulation and hygiene: with modified independence;sit to/from stand Pt Will Perform Tub/Shower Transfer: with modified independence;ambulating;shower seat;rolling walker  OT Frequency: Min 2X/week   Barriers to D/C:            Co-evaluation              AM-PAC PT "6 Clicks" Daily Activity     Outcome Measure Help from another person eating  meals?: None Help from another person taking care of personal grooming?: A Lot Help from another person toileting, which includes using toliet, bedpan, or urinal?: A Little Help from another person bathing (including washing, rinsing, drying)?: A Lot Help from another person to put on and taking off regular upper body clothing?: A Little Help from another person to put on and taking off regular lower body clothing?: A Little 6 Click Score: 17   End of Session Equipment Utilized During Treatment: Rolling walker Nurse Communication: Mobility status  Activity Tolerance: Patient tolerated treatment well Patient left: in chair;with call bell/phone within reach  OT Visit Diagnosis: Unsteadiness on feet (R26.81);Muscle weakness (generalized) (M62.81);Pain                Time: 7416-3845 OT Time Calculation (min): 18 min Charges:  OT General Charges $OT Visit: 1 Visit OT Evaluation $OT Eval Low Complexity: 1 Low G-Codes:       Hortencia Pilar 09/26/2017, 3:58 PM

## 2017-09-26 NOTE — Plan of Care (Signed)
  No Outcome Acute Rehab PT Goals(only PT should resolve) Pt Will Go Sit To Supine/Side Description Pt will be able to perform supine <> sit with min guard to supervision with HOB flat.  09/26/2017 1342 by Shanon Rosser, PT Flowsheets Taken 09/26/2017 1342  Pt will go Sit to Supine/Side with min guard assist Patient Will Transfer Sit To/From Stand Description Pt will be able to perform sit  <> stand with Min guard to supervision and perform standing x 2-3 minutes in order to improve mobility and endurance.   09/26/2017 1342 by Shanon Rosser, PT Flowsheets Taken 09/26/2017 1342  Patient will transfer sit to/from stand with min guard assist Pt Will Ambulate Description Pt will be able to perform gait x >150' with RW and SBA in order to improve her endurance and strength and assist with return to PLOF.   09/26/2017 1342 by Shanon Rosser, PT Flowsheets Taken 09/26/2017 1342  Pt will Ambulate 100 feet;with min guard assist;with least restrictive assistive device

## 2017-09-26 NOTE — Progress Notes (Signed)
   Subjective: Patient appears well this morning, sitting up in bed getting a breathing treatment. Husband/ significant other at bedside. Complaining of pain around her trach site, otherwise no complaints.   Objective:  Vital signs in last 24 hours: Vitals:   09/26/17 0016 09/26/17 0356 09/26/17 0545 09/26/17 0723  BP:   93/71   Pulse: (!) 110 94 (!) 102 (!) 112  Resp: 18 16 19 19   Temp:   98.4 F (36.9 C)   TempSrc:   Oral   SpO2: 98% 96% 100% 95%  Weight:   123 lb 1.6 oz (55.8 kg)   Height:       General: Very pleasant female, chronically ill-appearing, thin, well-developed, sitting up in bed in no acute distress Cardiac: Tachycardic with a regular rhythm, nl S1/S2, no murmurs, rubs or gallops  Pulm: CTAB.  Currently on trach collar and tolerating it well. No increased work of breathing noted Abd: soft, NTND, bowel sounds present Neuro: A&Ox3, able to move all 4 extremities Ext: warm and well perfused, no peripheral edema   Assessment/Plan:  51 year old female with PMH of suspected COPD, stress induced cardiomyopathy, and tobacco use admitted for acute on chronic hypercarbic respiratory failure with hypoxia and encephalopathy.  # Acute on Chronic Hypercarbic/Hypoxic Respiratory Failure s/p Tracheostomy: Thought secondary to suspected COPD from chronic tobacco use as well as Haemophilus influenza tracheobronchitis/PNA. She is off mechanical ventilation and tolerating a trach collar. She has been started on Augmentin and is on a slow Prednisone taper.  Currently satting well on room air. - Continue Augmentin via NG tube for 7 days (11/7-11/14)  - Continue Prednisone taper 20 mg x 3 days (11/10-11/12) then 10 mg x 3 days (11/13-11/15) - Continue Brovana, Duonebs, Pulmicort, prn Albuterol` - PCCM following for Trach management; possible transition to cuffless next week - IV Toradol q8h PRN added for pain  - PT/OT consult  - Speech recommended CIR, pending PT and OT eval to  determine disposition  # Acute Encephalopathy/Agitation: Resolved. Unclear cause apart from acute illness.  No history of substance abuse.  No electrolyte abnormalities. She has been on scheduled IV Haldol with prn IV Haldol as well and Seroquel, which is being tapered down. She required soft restraints as well.  Appears alert and awake today.  Responding to questions appropriately and following commands. All meds for agitation and restraints have been DC'd. PT/OT consult placed.   - Monitor for now  # Cardiomyopathy: Admitted 06/2017 for suspected stress-induced cardiomyopathy. Repeat TTE this admission shows improved EF of 55-60% (45% prior) with normal wall motion. She appears euvolemic on exam although she did appear euvolemic on prior admission. - Strict I/Os, Daily weights - Holding home Toprol-XL and Lasix with intermittent hypotension  # HTN: Currently normotensive now.  - Holding home Lisinopril, Toprol-XL, and Lasix for now.  F: none E: Continue to monitor and replete as needed N: Tube feeds via NGT  VTE ppx: SQ Lovenox   Code status: Full code   Dispo: Discharge pending PT/OT recs for rehab.   Caitlin Ochs, MD 09/26/2017, 7:46 AM Pager: [phone]

## 2017-09-27 ENCOUNTER — Inpatient Hospital Stay (HOSPITAL_COMMUNITY): Payer: Self-pay

## 2017-09-27 DIAGNOSIS — Z930 Tracheostomy status: Secondary | ICD-10-CM

## 2017-09-27 DIAGNOSIS — Z720 Tobacco use: Secondary | ICD-10-CM

## 2017-09-27 DIAGNOSIS — E876 Hypokalemia: Secondary | ICD-10-CM

## 2017-09-27 DIAGNOSIS — R739 Hyperglycemia, unspecified: Secondary | ICD-10-CM

## 2017-09-27 DIAGNOSIS — D72829 Elevated white blood cell count, unspecified: Secondary | ICD-10-CM

## 2017-09-27 DIAGNOSIS — T380X5A Adverse effect of glucocorticoids and synthetic analogues, initial encounter: Secondary | ICD-10-CM

## 2017-09-27 DIAGNOSIS — I5042 Chronic combined systolic (congestive) and diastolic (congestive) heart failure: Secondary | ICD-10-CM

## 2017-09-27 DIAGNOSIS — R410 Disorientation, unspecified: Secondary | ICD-10-CM

## 2017-09-27 LAB — GLUCOSE, CAPILLARY
GLUCOSE-CAPILLARY: 100 mg/dL — AB (ref 65–99)
GLUCOSE-CAPILLARY: 101 mg/dL — AB (ref 65–99)
GLUCOSE-CAPILLARY: 101 mg/dL — AB (ref 65–99)
GLUCOSE-CAPILLARY: 108 mg/dL — AB (ref 65–99)
Glucose-Capillary: 104 mg/dL — ABNORMAL HIGH (ref 65–99)
Glucose-Capillary: 89 mg/dL (ref 65–99)

## 2017-09-27 LAB — BASIC METABOLIC PANEL
ANION GAP: 8 (ref 5–15)
BUN: 29 mg/dL — AB (ref 6–20)
CALCIUM: 8.7 mg/dL — AB (ref 8.9–10.3)
CO2: 24 mmol/L (ref 22–32)
Chloride: 106 mmol/L (ref 101–111)
Creatinine, Ser: 0.53 mg/dL (ref 0.44–1.00)
GFR calc Af Amer: >60 mL/min (ref >60)
Glucose, Bld: 109 mg/dL — ABNORMAL HIGH (ref 65–99)
POTASSIUM: 3.3 mmol/L — AB (ref 3.5–5.1)
Sodium: 138 mmol/L (ref 135–145)

## 2017-09-27 MED ORDER — POTASSIUM CHLORIDE 20 MEQ/15ML (10%) PO SOLN
40.0000 meq | Freq: Two times a day (BID) | ORAL | Status: AC
  Administered 2017-09-27 (×2): 40 meq via ORAL
  Filled 2017-09-27 (×2): qty 30

## 2017-09-27 NOTE — Progress Notes (Signed)
Physical Therapy Treatment Patient Details Name: Caitlin [name] MRN: [id number] DOB: May 15, 1966 Today's Date: 09/27/2017    History of Present Illness Pt is a 51 yo female admitted through the ED on 09/08/17 with COPD exacerbation and acute respiratory failure. Pt had continuous bronchospasms and was intubated on 09/10/17 and trach was placed on 09/17/17. Pt has had a feeding tube placed since admission. PMH signficiant for CHF, ARD, AKI, hypotension, SVT and CHF.     PT Comments    Pt progressing well and able to perform gait on room air with spO2 100%.  Pt requires min A for gait with and without RW. Continue to recommend CIR.   Follow Up Recommendations  CIR     Equipment Recommendations  Rolling walker with 5" wheels;3in1 (PT)    Recommendations for Other Services Rehab consult     Precautions / Restrictions Precautions Precautions: Fall Precaution Comments: passy muir valve Restrictions Weight Bearing Restrictions: No    Mobility  Bed Mobility         Supine to sit: Supervision Sit to supine: Supervision   General bed mobility comments: supervision, increased time  Transfers Overall transfer level: Needs assistance Equipment used: None Transfers: Sit to/from [facility] Sit to Stand: Min assist Stand pivot transfers: Min assist       General transfer comment: performed transfers with and without RW, min A  Ambulation/Gait Ambulation/Gait assistance: Min assist Ambulation Distance (Feet): 100 Feet Assistive device: None       General Gait Details: min A gait without RW x 100', occasional mod A to correct LOB, mild ataxia/scissoring.  gait then performed with RW with pt stating she feels more comfortable, min A for turning and taking steps backward   Stairs            Wheelchair Mobility    Modified Rankin (Stroke Patients Only)       Balance Overall balance assessment: Needs assistance           Standing  balance-Leahy Scale: Poor Standing balance comment: min A                            Cognition Arousal/Alertness: Awake/alert Behavior During Therapy: WFL for tasks assessed/performed Overall Cognitive Status: Within Functional Limits for tasks assessed                                        Exercises      General Comments        Pertinent Vitals/Pain Pain Assessment: No/denies pain    Home Living                      Prior Function            PT Goals (current goals can now be found in the care plan section) Progress towards PT goals: Progressing toward goals    Frequency    Min 3X/week      PT Plan Current plan remains appropriate    Co-evaluation              AM-PAC PT "6 Clicks" Daily Activity  Outcome Measure  Difficulty turning over in bed (including adjusting bedclothes, sheets and blankets)?: None Difficulty moving from lying on back to sitting on the side of the bed? : A Little Difficulty sitting down on and standing  up from a chair with arms (e.g., wheelchair, bedside commode, etc,.)?: A Little Help needed moving to and from a bed to chair (including a wheelchair)?: A Little Help needed walking in hospital room?: A Lot Help needed climbing 3-5 steps with a railing? : A Lot 6 Click Score: 17    End of Session   Activity Tolerance: Patient tolerated treatment well Patient left: in bed;with call bell/phone within reach;with bed alarm set Nurse Communication: Mobility status PT Visit Diagnosis: Unsteadiness on feet (R26.81);Muscle weakness (generalized) (M62.81);Difficulty in walking, not elsewhere classified (R26.2)     Time: 0940-1006 PT Time Calculation (min) (ACUTE ONLY): 26 min  Charges:  $Gait Training: 23-37 mins                    G Codes:       Isabelle Course, PT, DPT   Oyuki Hogan 09/27/2017, 10:38 AM

## 2017-09-27 NOTE — Procedures (Signed)
First Trach Change  Size 6 cuffed tracheostomy removed and size 6 cuffless trach replaced with good color change and air movement bilaterally.  [name], M.D. [facility] Pulmonary/Critical Care Medicine. Pager: [phone]. After hours pager: [phone].

## 2017-09-27 NOTE — Progress Notes (Signed)
   Subjective:  No acute events overnight. Doing well this morning with no new complaints. Continues to tolerate trach collar while on room air.   Objective:  Vital signs in last 24 hours: Vitals:   09/27/17 0014 09/27/17 0024 09/27/17 0419 09/27/17 0607  BP:    114/77  Pulse: 93 100  (!) 105  Resp: 17 20  18   Temp:    98.7 F (37.1 C)  TempSrc:    Oral  SpO2: 100% 100% 100% 100%  Weight:      Height:       General: Very pleasant female, chronically ill-appearing, thin, well-developed, sitting up in bed in no acute distress Cardiac: regular rate and rhythm, nl S1/S2, no murmurs, rubs or gallops  Pulm: CTAB.  Currently on trach collar and tolerating it well. No increased work of breathing noted while on room air.  Abd: soft, NTND, bowel sounds present Neuro: A&Ox3, able to move all 4 extremities Ext: warm and well perfused, no peripheral edema   Assessment/Plan:  51 year old female with PMH of suspected COPD, stress induced cardiomyopathy, and tobacco use admitted for acute on chronic hypercarbic respiratory failure with hypoxia and encephalopathy.  # Acute on Chronic Hypercarbic/Hypoxic Respiratory Failure s/p Tracheostomy: Thought secondary to suspected COPD from chronic tobacco use as well as Haemophilus influenza tracheobronchitis/PNA. On Augmentin and is on a slow Prednisone taper.  On trach collar and tolerating well while on room air. PT/OT and speech recommended CIR for rehabilitation. Per critical care note, planning to cap trach today and overnight. Decannulation if able to cap for 48 hrs.  - Continue Augmentin via NG tube for 7 days (11/7-11/14)  - Continue Prednisone taper 20 mg x 3 days (11/10-11/12) then 10 mg x 3 days (11/13-11/15) - Continue Brovana, Duonebs, Pulmicort, prn Albuterol` - PCCM following for [employer], plan for cap trach today and overnight  - IV Toradol q8h PRN added for pain  - CIR consult place  - SLP for swallow eval today   # Acute  Encephalopathy/Agitation: Resolved.    # Cardiomyopathy: Admitted 06/2017 for suspected stress-induced cardiomyopathy. Repeat TTE this admission shows improved EF of 55-60% (45% prior) with normal wall motion. She appears euvolemic on exam although she did appear euvolemic on prior admission. - Strict I/Os, Daily weights - Holding home Toprol-XL and Lasix with intermittent hypotension   # HTN: Currently normotensive now.  - Holding home Lisinopril, Toprol-XL, and Lasix for now.  F: none E: Continue to monitor and replete as needed N: Tube feeds via NGT  VTE ppx: SQ Lovenox   Code status: Full code   Dispo: Discharge pending CIR evaluation.   [name], MD 09/27/2017, 6:33 AM Pager: [phone]

## 2017-09-27 NOTE — Progress Notes (Addendum)
  Speech Language Pathology Treatment: Dysphagia;[place] Speaking valve  Patient Details Name: Caitlin [name] MRN: [id number] DOB: November 09, 1966 Today's Date: 09/27/2017 Time: 0630-1601 SLP Time Calculation (min) (ACUTE ONLY): 28 min  Assessment / Plan / Recommendation Clinical Impression  Pt demonstrated toleration of PMSV for 25 minutes with good upper airway patency and no change in stable vital signs or air trapping noted. Pt reported mild difficulty breathing following placement of speaking valve; educated pt on change in airflow with PMSV in place and that breathing will become easier with more use. Pt initially needed min verbal encouragement to produce voice; able to produce strong, clear voice. While wearing PSMV pt tolerated PO trials of thin liquid, puree and solid consistencies; immediate coughing noted with consecutive straw sips of thin liquid but no overt s/sx of aspiration noted with all other consistencies. Per SLP palpation, adequate hyolaryngeal elevation observed and pt's cough strong. Will follow-up with an objective swallow study to confirm airway protection before starting diet; MBS scheduled for later today. SLP educated pt on PMSV precautions and how to apply/remove independently; pt verbalized understanding of precautions and demonstrated ability to apply/remove PSMV. Recommend pt wear PSMV during all waking hours. Will continue to follow with SLP intervention for dysphagia management and further toleration of PMSV.    HPI HPI: Ms. Caitlin [name] is a 51 year old female with PMH of suspected COPD, stress induced cardiomyopathy, and tobacco use who was admitted on 09/08/17 for acute hypoxic respiratory failure. She did not respond to initial steroids, bronchodilators, or NIPPV and was subsequently intubated and transferred to the ICU on 10/25. She had significant agitation and encephalopathy and eventually underwent tracheostomy on 11/2. Tracheal aspirate from 11/2 bronchoscopy grew moderate  Haemophilus influenzae (beta lactamase negative).       SLP Plan  Continue with current plan of care       Recommendations  Diet recommendations: NPO      Patient may use Passy-Muir Speech Valve: During all waking hours (remove during sleep) PMSV Supervision: Intermittent         Oral Care Recommendations: Oral care BID Follow up Recommendations: Inpatient Rehab SLP Visit Diagnosis: Dysphagia, unspecified (R13.10) Plan: Continue with current plan of care       [place], Student SLP 09/27/2017, 10:58 AM

## 2017-09-27 NOTE — Progress Notes (Signed)
Tolerating Capped trials well. Pt is stable at this time, no distress or complications noted. BBS are clear and decrease. No increase WOB/SOB or Respiratory Compromise noted. Pt states that she is getting ready to "take a nap" RT will monitor

## 2017-09-27 NOTE — Progress Notes (Signed)
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  She also has a 30-pack-year history of smoking and continues to smoke.  She been living with her sister and making progress for rehabilitation standpoint.  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  She took her rescue bronchodilators without much relief because of this she went to the emergency room.  She was given bronchodilator therapy, and sent home with a prescription of prednisone.  Her symptoms did not get better, she returned to the emergency room on the 24th with resting shortness of breath, and almost continuous use of her rescue bronchodilator without relief.  Her initial chest x-ray was without infiltrate or evidence of edema, she had no sick exposures, no fever or chills.  She was admitted with a working diagnosis of acute exacerbation chronic obstructive pulmonary disease, and treated appropriately with supplemental oxygen, inhaled bronchodilators, and systemic steroids.  On the evening of 10/25 she developed acute distress, was found by nursing staff tripod in bed.  Was treated with IV Lasix, pulse IV steroid, and placed on noninvasive positive pressure ventilation.  Throughout the course of the evening and going into the a.m. hours of 10/26.  Her work of breathing has remained quite elevated.  She has been on and off BiPAP, continues to have cough, continues to wheeze, has not been getting much relief from rescue bronchodilation.  Critical care was asked to  see because of work of breathing, noninvasive positive pressure ventilation requirements, and concern about clinical deterioration.   EVENTS 10/24 admit 10/26 intubated 11/2 tracheotomy placed  SUBJECTIVE/OVERNIGHT/INTERVAL HX 10/28 Unable to wean due to agitation and persistent bronchospasm. 10/29 agitation overnight, also with bradycardia down to 30s so precedex and propofol held.  Nose just dictating his name She was noted to have pauses on cardiac monitoring which may be consistent with heart block. 10/31 no acute events overnight. Patient sedated on fentanyl versed and propofol. 11/1 no acute events, remains sedated on fentanyl, versed, and propofol 11/2 hypotension overnight improved with d/c prop and initiation of bolus followed by change in mentation improved with restarting prop. Tachycardic event resolved with advancement of ETT. Plan to trach. Improved aeration of R-lung base on CXR with stable tube placement, mild L-base atelectasis remains 11/3 failed weaning this AM due to lack of effort 11/4 TC x15 minutes so far, no events overnight 11/5 Full vent support overnight with wean on TC, remains altered overnight despite methadone 11/6 Remained on TC overnight. Required soft restraints for agitation.  11/7 Decreasing prednisone, stable on TC, started Augmentin for haemophilus tracheobronchitis/pneumonia 11/8 Remains stable on TC, afebrile on Augmentin with improved WBC 11/9 fell out of bed last night.  Otherwise no acute distress no issues 11/12 on TC on RA with no events  VITAL SIGNS: BP 114/77 (BP Location: Left Arm)   Pulse 92   Temp 98.7 F (37.1 C) (Oral)   Resp 20   Ht 5\' 9"  (1.753 m)   Wt 55.8 kg (123 lb 1.6 oz)   LMP 09/09/2014 (Approximate)   SpO2  100%   BMI 18.18 kg/m   HEMODYNAMICS:    VENTILATOR SETTINGS: FiO2 (%):  [21 %] 21 %  INTAKE / OUTPUT: I/O last 3 completed shifts: In: 660 [NG/GT:660] Out: -    EXAM: General: Chronically ill appearing,  NAD HEENT: #6 cuffed tracheostomy currently midline, minimal discharge and a strong cough Pulmonary: Coarse BS diffusely Cardiac: RRR, Nl S1/S, -M/R/G. Abdomen: Soft, NT, ND and +BS Extremities/musculoskeletal: -edema and -tenderness Neuro/psych: Awake, alert, follows commands, interactive, moves all extremities.  Remains impulsive.  LABS  PULMONARY No results for input(s): PHART, PCO2ART, PO2ART, HCO3, TCO2, O2SAT in the last 168 hours.  Invalid input(s): PCO2, PO2 CBC Recent Labs  Lab 09/22/17 0411 09/23/17 0221 09/24/17 0237  HGB 11.1* 12.2 10.9*  HCT 33.9* 37.0 33.3*  WBC 18.5* 15.7* 14.1*  PLT 285 319 291   COAGULATION No results for input(s): INR in the last 168 hours. CARDIAC No results for input(s): TROPONINI in the last 168 hours. No results for input(s): PROBNP in the last 168 hours.  CHEMISTRY Recent Labs  Lab 09/21/17 0357 09/22/17 0411 09/23/17 0221 09/24/17 0237 09/25/17 0206 09/26/17 0445 09/27/17 0442  NA 138 139 139 138 137 137 138  K 3.3* 3.3* 3.6 3.3* 4.1 3.6 3.3*  CL 100* 105 105 107 107 107 106  CO2 30 27 28 23 24 24 24   GLUCOSE 103* 111* 103* 115* 107* 106* 109*  BUN 18 18 21* 22* 22* 24* 29*  CREATININE 0.49 0.49 0.55 0.47 0.50 0.46 0.53  CALCIUM 9.2 8.5* 8.7* 8.7* 8.9 8.9 8.7*  MG 1.9 2.0 2.2 1.9  --   --   --   PHOS 3.5 3.6 3.9 4.1  --   --   --    Estimated Creatinine Clearance: 71.6 mL/min (by C-G formula based on SCr of 0.53 mg/dL).  LIVER Recent Labs  Lab 09/21/17 0357 09/22/17 0411 09/23/17 0221 09/24/17 0237  ALBUMIN 2.8* 2.3* 2.5* 2.4*   INFECTIOUS No results for input(s): LATICACIDVEN, PROCALCITON in the last 168 hours. ENDOCRINE CBG (last 3)  Recent Labs    09/27/17 0045 09/27/17 0400 09/27/17 0744  GLUCAP 101* 104* 100*   IMAGING x48h  - image(s) personally visualized  -   highlighted in bold No results found.   ASSESSMENT and PLAN  Acute on chronic hypercarbic respiratory failure, COPD: improving on  TC Tobacco use disorder HFpEF (LVEF 55%), BNP 50.8 > 201.1 Primary hypertension Hypotension, off Neo gtt since 11/4 Sinus tachycardia AKI resolved Hypokalemia, resolved Nutrition Acute encephalopathy Hyperthyroidism, TSH 0.158, FT4 1.18 Leukocytosis Haemophilus tracheobronchitis/pneumonia: found on resp cx 11/4  Pulmonary problem list: Acute on chronic hypercarbic respiratory failure in the setting of acute exacerbation of chronic obstructive pulmonary disease, and Haemophilus influenza tracheobronchitis/pneumonia 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  [name], M.D. [facility] Pulmonary/Critical Care Medicine. Pager: [phone]. After hours pager: [phone].

## 2017-09-27 NOTE — Evaluation (Signed)
Objective Swallowing Evaluation: Type of Study: MBS-Modified Barium Swallow Study   Patient Details  Name: Caitlin [name] MRN: [id number] Date of Birth: Sep 04, 1966  Today's Date: 09/27/2017 Time: SLP Start Time (ACUTE ONLY): 1035 -SLP Stop Time (ACUTE ONLY): 1047  SLP Time Calculation (min) (ACUTE ONLY): 12 min   Past Medical History:  Past Medical History:  Diagnosis Date  . CHF (congestive heart failure) ([place])   . COPD (chronic obstructive pulmonary disease) ([place])   . NSTEMI (non-ST elevated myocardial infarction) [facility])    Past Surgical History: History reviewed. No pertinent surgical history. HPI: [name] is a 51 year old female with PMH of suspected COPD, stress induced cardiomyopathy, and tobacco use who was admitted on 09/08/17 for acute hypoxic respiratory failure. She did not respond to initial steroids, bronchodilators, or NIPPV and was subsequently intubated and transferred to the ICU on 10/25. She had significant agitation and encephalopathy and eventually underwent tracheostomy on 11/2. Tracheal aspirate from 11/2 bronchoscopy grew moderate Haemophilus influenzae (beta lactamase negative).    Subjective: alert, cooperative, PMSV in place    Assessment / Plan / Recommendation  CHL IP CLINICAL IMPRESSIONS 09/27/2017  Clinical Impression Pt demonstrates normal swallow function with adequate airway protection during the swallow; no aspiration/penetration observed across consistencies. No concern indicating a need for continued tube feeding. Recommend regular diet with thin liquid and meds whole with liquid. Advised pt to place PMSV prior to eating/drinking; verbal confirmation given. No follow-up SLP intervention needed for dysphagia management. Will sign off.   SLP Visit Diagnosis Dysphagia, unspecified (R13.10)  Attention and concentration deficit following --  Frontal lobe and executive function deficit following --  Impact on safety and function Mild aspiration risk       CHL IP TREATMENT RECOMMENDATION 09/27/2017  Treatment Recommendations No treatment recommended at this time     Prognosis 09/27/2017  Prognosis for Safe Diet Advancement Good  Barriers to Reach Goals --  Barriers/Prognosis Comment --    CHL IP DIET RECOMMENDATION 09/27/2017  SLP Diet Recommendations Regular solids;Thin liquid  Liquid Administration via Cup;Straw  Medication Administration Whole meds with liquid  Compensations Small sips/bites;Slow rate  Postural Changes Seated upright at 90 degrees      CHL IP OTHER RECOMMENDATIONS 09/27/2017  Recommended Consults --  Oral Care Recommendations Oral care BID  Other Recommendations Place PMSV during PO intake      CHL IP FOLLOW UP RECOMMENDATIONS 09/27/2017  Follow up Recommendations Inpatient Rehab      CHL IP FREQUENCY AND DURATION 09/25/2017  Speech Therapy Frequency (ACUTE ONLY) min 2x/week  Treatment Duration 2 weeks           CHL IP ORAL PHASE 09/27/2017  Oral Phase WFL  Oral - Pudding Teaspoon --  Oral - Pudding Cup --  Oral - Honey Teaspoon --  Oral - Honey Cup --  Oral - Nectar Teaspoon --  Oral - Nectar Cup --  Oral - Nectar Straw --  Oral - Thin Teaspoon --  Oral - Thin Cup --  Oral - Thin Straw --  Oral - Puree --  Oral - Mech Soft --  Oral - Regular --  Oral - Multi-Consistency --  Oral - Pill --  Oral Phase - Comment --    CHL IP PHARYNGEAL PHASE 09/27/2017  Pharyngeal Phase WFL  Pharyngeal- Pudding Teaspoon --  Pharyngeal --  Pharyngeal- Pudding Cup --  Pharyngeal --  Pharyngeal- Honey Teaspoon --  Pharyngeal --  Pharyngeal- Honey Cup --  Pharyngeal --  Pharyngeal- Nectar Teaspoon --  Pharyngeal --  Pharyngeal- Nectar Cup --  Pharyngeal --  Pharyngeal- Nectar Straw --  Pharyngeal --  Pharyngeal- Thin Teaspoon --  Pharyngeal --  Pharyngeal- Thin Cup --  Pharyngeal --  Pharyngeal- Thin Straw --  Pharyngeal --  Pharyngeal- Puree --  Pharyngeal --  Pharyngeal- Mechanical  Soft --  Pharyngeal --  Pharyngeal- Regular --  Pharyngeal --  Pharyngeal- Multi-consistency --  Pharyngeal --  Pharyngeal- Pill --  Pharyngeal --  Pharyngeal Comment --     No flowsheet data found.  No flowsheet data found.  Aaron Edelman, Student SLP 09/27/2017, 11:30 AM

## 2017-09-27 NOTE — Consult Note (Signed)
Physical Medicine and Rehabilitation Consult  Reason for Consult: Debility with encephalopathy Referring Physician: Dr. Lynnae January.    HPI: Caitlin [name] is a 51 y.o. 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 History taken from chart review and patient. She was stared on bronchodilators and steroids but declined on 10/25 with increased WOB requiring intubation. She had difficulty with vent wean due to agitation and ultimately required tracheostomy prior to being weaned to ATC. Hospital course significant for Hemophilus tracheobronchitis/PNA treated with Augmentin, hypotension, delirium and made NPO due aspiration risk with signs of decreased airway protection.  To start capping of trach today with possible decannulation in 48 hours if able to tolerate capping. Prednisone being tapered off. MBS done today and to start regular diet with PMSV in place.  She is tolerating PMSV trials and noted to be severely deconditioned on PT evaluation yesterday.   Patient with admission 8/30- 07/20/17 when she was diagnosed with CM and she has been living with sister to help regain strength due to debility from recent medical issues. She was able to ambulate without AD and she was getting back to doing household chores.   CIR recommended due to decline in functional status.     Review of Systems  HENT: Negative for hearing loss and tinnitus.   Eyes: Negative for blurred vision and double vision.  Respiratory: Positive for cough. Negative for shortness of breath (respiratoyr status improving).   Cardiovascular: Negative for chest pain and palpitations.  Gastrointestinal: Negative for abdominal pain, heartburn and nausea.  Genitourinary: Negative for dysuria and urgency.  Musculoskeletal: Negative for back pain, joint pain and myalgias.  Skin: Negative for rash.  Neurological: Positive for weakness. Negative for dizziness,  sensory change, focal weakness and headaches.  Psychiatric/Behavioral: The patient is not nervous/anxious and does not have insomnia.   All other systems reviewed and are negative.     Past Medical History:  Diagnosis Date  . CHF (congestive heart failure) ([place])   . COPD (chronic obstructive pulmonary disease) ([place])   . NSTEMI (non-ST elevated myocardial infarction) [facility])    History reviewed. No pertinent surgical history.    Family History  Problem Relation Age of Onset  . Heart disease Mother        age 104's  . Hypertension Mother   . Aneurysm Father        brain    Social History:  Single. 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 She  reports that she was smoking 1 PPD. She has been smoking use included cigarettes. She smoked 1.00 pack per day. she has never used smokeless tobacco. She reports that she drinks alcohol. She reports that she uses drugs. Drug: Marijuana.    Allergies: No Known Allergies    Medications Prior to Admission  Medication Sig Dispense Refill  . albuterol (PROVENTIL HFA;VENTOLIN HFA) 108 (90 Base) MCG/ACT inhaler Inhale 2 puffs into the lungs every 4 (four) hours as needed for wheezing or shortness of breath. 1 Inhaler 0  . albuterol (PROVENTIL) (2.5 MG/3ML) 0.083% nebulizer solution Take 3 mLs (2.5 mg total) by nebulization every 6 (six) hours as needed for wheezing or shortness of breath. 75 mL 12  . furosemide (LASIX) 20 MG tablet Take 1 tablet (20 mg total) by mouth daily. 30 tablet 0  . ipratropium (ATROVENT) 0.02 % nebulizer solution Take 2.5 mLs (0.5 mg total) by  nebulization 4 (four) times daily. 75 mL 12  . lisinopril (PRINIVIL,ZESTRIL) 10 MG tablet Take 1 tablet (10 mg total) by mouth daily. 30 tablet 0  . metoprolol succinate (TOPROL-XL) 25 MG 24 hr tablet Take 1 tablet (25 mg total) by mouth daily. 30 tablet 0  . predniSONE (DELTASONE) 20 MG tablet Take 2 tablets (40 mg total) by mouth daily. 8 tablet 0  . vitamin B-12 (CYANOCOBALAMIN)  100 MCG tablet Take 100 mcg by mouth daily.    Marland Kitchen doxycycline (VIBRAMYCIN) 100 MG capsule Take 1 capsule (100 mg total) by mouth 2 (two) times daily. (Patient not taking: Reported on 09/07/2017) 20 capsule 0  . predniSONE (DELTASONE) 20 MG tablet 2 tabs po daily x 4 days (Patient not taking: Reported on 09/08/2017) 8 tablet 0    Home: Home Living Family/patient expects to be discharged to:: Inpatient rehab Living Arrangements: Other relatives Available Help at Discharge: Family, Available PRN/intermittently  Functional History: Prior Function Level of Independence: Independent Comments: completely independent with all ADLs and IADLs prior to admission Functional Status:  Mobility: Bed Mobility Overal bed mobility: Needs Assistance Bed Mobility: Supine to Sit Supine to sit: Min guard Sit to supine: Min guard General bed mobility comments: Min guard for safety with use of railing and HOB elevated to get into and out of bed.  Transfers Overall transfer level: Needs assistance Equipment used: Rolling walker (2 wheeled) Transfers: Sit to/from Stand, [employer] Transfers Sit to Stand: Min assist Stand pivot transfers: Min assist General transfer comment: EOB>recliner. Decreased speed for age.  Ambulation/Gait General Gait Details: Did not attempt this session    ADL: ADL Overall ADL's : Needs assistance/impaired Eating/Feeding: NPO Eating/Feeding Details (indicate cue type and reason): NG tube Grooming: Minimal assistance, Sitting Upper Body Bathing: Moderate assistance, Sitting Lower Body Bathing: Moderate assistance Upper Body Dressing : Minimal assistance, Sitting Lower Body Dressing: Minimal assistance, Sit to/from stand Toilet Transfer: Minimal assistance, Stand-pivot, RW Toileting- Clothing Manipulation and Hygiene: Minimal assistance Tub/ Shower Transfer: Minimal assistance, Stand-pivot, 3 in 1, Rolling walker General ADL Comments: Pt completed bed mobility and SPT  EOB>recliner to simulate toilet transfer.   Cognition: Cognition Overall Cognitive Status: Within Functional Limits for tasks assessed Orientation Level: Intubated/Tracheostomy - Unable to assess Cognition Arousal/Alertness: Awake/alert Behavior During Therapy: WFL for tasks assessed/performed Overall Cognitive Status: Within Functional Limits for tasks assessed  Blood pressure 114/77, pulse 92, temperature 98.7 F (37.1 C), temperature source Oral, resp. rate 20, height 5\' 9"  (1.753 m), weight 55.8 kg (123 lb 1.6 oz), last menstrual period 09/09/2014, SpO2 100 %. Physical Exam  Constitutional: She is oriented to person, place, and time. She appears well-developed and well-nourished. No distress.  HENT:  Head: Normocephalic and atraumatic.  Eyes: Conjunctivae and EOM are normal. Pupils are equal, round, and reactive to light.  Neck: Normal range of motion.  Trach in place.   Cardiovascular: Normal rate and regular rhythm.  Respiratory: Effort normal. No stridor. No respiratory distress. She has wheezes in the right lower field and the left lower field. She has rhonchi in the left lower field.  GI: Soft. Bowel sounds are normal. She exhibits no distension. There is no tenderness.  Musculoskeletal: She exhibits no edema or tenderness.  Neurological: She is alert and oriented to person, place, and time.  Dysphonia but able to phonate without PMSV.  Able to follow basic commands without difficulty.  Motor: B/l UE 4-/5 proximal to distal B/l LE: 4+/5 proximal to distal  Skin: Skin is  warm and dry. She is not diaphoretic.  Right neck with dressing c/d/i  Psychiatric: She has a normal mood and affect. Her behavior is normal. Judgment and thought content normal.    Results for orders placed or performed during the hospital encounter of 09/08/17 (from the past 24 hour(s))  Glucose, capillary     Status: Abnormal   Collection Time: 09/26/17 11:55 AM  Result Value Ref Range    Glucose-Capillary 137 (H) 65 - 99 mg/dL  Glucose, capillary     Status: None   Collection Time: 09/26/17  4:51 PM  Result Value Ref Range   Glucose-Capillary 98 65 - 99 mg/dL  Glucose, capillary     Status: None   Collection Time: 09/26/17  8:56 PM  Result Value Ref Range   Glucose-Capillary 94 65 - 99 mg/dL  Glucose, capillary     Status: Abnormal   Collection Time: 09/27/17 12:45 AM  Result Value Ref Range   Glucose-Capillary 101 (H) 65 - 99 mg/dL  Glucose, capillary     Status: Abnormal   Collection Time: 09/27/17  4:00 AM  Result Value Ref Range   Glucose-Capillary 104 (H) 65 - 99 mg/dL  Basic metabolic panel     Status: Abnormal   Collection Time: 09/27/17  4:42 AM  Result Value Ref Range   Sodium 138 135 - 145 mmol/L   Potassium 3.3 (L) 3.5 - 5.1 mmol/L   Chloride 106 101 - 111 mmol/L   CO2 24 22 - 32 mmol/L   Glucose, Bld 109 (H) 65 - 99 mg/dL   BUN 29 (H) 6 - 20 mg/dL   Creatinine, Ser 0.53 0.44 - 1.00 mg/dL   Calcium 8.7 (L) 8.9 - 10.3 mg/dL   GFR calc non Af Amer >60 >60 mL/min   GFR calc Af Amer >60 >60 mL/min   Anion gap 8 5 - 15  Glucose, capillary     Status: Abnormal   Collection Time: 09/27/17  7:44 AM  Result Value Ref Range   Glucose-Capillary 100 (H) 65 - 99 mg/dL   No results found.  Assessment/Plan: Diagnosis: Debility Labs independently reviewed.  Records reviewed and summated above.  1. Does the need for close, 24 hr/day medical supervision in concert with the patient's rehab needs make it unreasonable for this patient to be served in a less intensive setting? Yes  2. 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 Due to safety, skin/wound care, disease management and patient education, does the patient require 24 hr/day rehab nursing? Yes 4. Does the patient require coordinated care of a physician, rehab nurse, PT (1-2 hrs/day, 5 days/week), OT (1-2 hrs/day, 1-2 days/week) and SLP (1-2 hrs/day, 5 days/week) to address physical and functional deficits in the context of the above medical diagnosis(es)? Yes Addressing deficits in the following areas: balance, endurance, locomotion, strength, transferring, bathing, dressing, toileting, speech and psychosocial support 5. Can the patient actively participate in an intensive therapy program of at least 3 hrs of therapy per day at least 5 days per week? Yes 6. The potential  for patient to make measurable gains while on inpatient rehab is excellent 7. Anticipated functional outcomes upon discharge from inpatient rehab are modified independent and supervision  with PT, modified independent and supervision with OT, modified independent with SLP. 8. Estimated rehab length of stay to reach the above functional goals is: 5-9 days. 9. Anticipated D/C setting: Home 10. Anticipated post D/C treatments: HH therapy and Home excercise program 11. Overall Rehab/Functional Prognosis: excellent and good  RECOMMENDATIONS: This patient's condition is appropriate for continued rehabilitative care in the following setting: Likely CIR soon. Patient has agreed to participate in recommended program. Yes Note that insurance prior authorization may be required for reimbursement for recommended care.  Comment: Rehab Admissions Coordinator to follow up.  Delice Lesch, MD, ABPMR [name], [place] 09/27/2017

## 2017-09-27 NOTE — Progress Notes (Signed)
Inpatient Rehabilitation  Met with patient to discuss team's recommendation for an IP Rehab admission.  Shared booklets, insurance verification letter, and answered initial questions.  Patient in agreement with plan and I am hopeful to admit patient to IP Rehab tomorrow, 09/28/17.  Discussed with CSW and nurse case Freight forwarder.  Call if questions.   Carmelia Roller., CCC/SLP Admission Coordinator  [place]  Cell [phone]

## 2017-09-27 NOTE — Progress Notes (Signed)
CSW continuing to follow- pt has been off vent since 11/6 per flowsheet- no longer needing out of state placement for ventilator care- [facility] in [place] informed  Per CCM note patient to begin capping trials with possibility of decannulation in 48 hours if successful.  If pt no longer needing trach care and continues to show good mobility with PT unsure if patient would qualify for [facility] SNF since pt does not have insurance  CSW to evaluate when closer to [place], [place] Social Worker [phone]

## 2017-09-27 NOTE — Progress Notes (Signed)
Trach changed by MD.  Lurline Idol capped per MD order.  Pt tolerating well at this time.  O2 sats 100% RT will continue to monitor.

## 2017-09-28 ENCOUNTER — Inpatient Hospital Stay (HOSPITAL_COMMUNITY)
Admission: RE | Admit: 2017-09-28 | Discharge: 2017-10-04 | DRG: 945 | Disposition: A | Discharge status: Home / Self Care | Payer: Self-pay | Source: Intra-hospital | Attending: Physical Medicine & Rehabilitation | Admitting: Physical Medicine & Rehabilitation

## 2017-09-28 ENCOUNTER — Other Ambulatory Visit: Payer: Self-pay

## 2017-09-28 ENCOUNTER — Encounter (HOSPITAL_COMMUNITY): Payer: Self-pay

## 2017-09-28 DIAGNOSIS — R4182 Altered mental status, unspecified: Secondary | ICD-10-CM

## 2017-09-28 DIAGNOSIS — F17210 Nicotine dependence, cigarettes, uncomplicated: Secondary | ICD-10-CM

## 2017-09-28 DIAGNOSIS — I5181 Takotsubo syndrome: Secondary | ICD-10-CM

## 2017-09-28 DIAGNOSIS — I959 Hypotension, unspecified: Secondary | ICD-10-CM | POA: Diagnosis present

## 2017-09-28 DIAGNOSIS — J440 Chronic obstructive pulmonary disease with acute lower respiratory infection: Secondary | ICD-10-CM | POA: Diagnosis present

## 2017-09-28 DIAGNOSIS — E8809 Other disorders of plasma-protein metabolism, not elsewhere classified: Secondary | ICD-10-CM

## 2017-09-28 DIAGNOSIS — I5042 Chronic combined systolic (congestive) and diastolic (congestive) heart failure: Secondary | ICD-10-CM | POA: Diagnosis present

## 2017-09-28 DIAGNOSIS — R5381 Other malaise: Principal | ICD-10-CM | POA: Diagnosis present

## 2017-09-28 DIAGNOSIS — Z930 Tracheostomy status: Secondary | ICD-10-CM

## 2017-09-28 DIAGNOSIS — R739 Hyperglycemia, unspecified: Secondary | ICD-10-CM | POA: Diagnosis present

## 2017-09-28 DIAGNOSIS — Z8249 Family history of ischemic heart disease and other diseases of the circulatory system: Secondary | ICD-10-CM

## 2017-09-28 DIAGNOSIS — R531 Weakness: Secondary | ICD-10-CM | POA: Diagnosis present

## 2017-09-28 DIAGNOSIS — R0902 Hypoxemia: Secondary | ICD-10-CM

## 2017-09-28 DIAGNOSIS — J14 Pneumonia due to Hemophilus influenzae: Secondary | ICD-10-CM | POA: Diagnosis present

## 2017-09-28 DIAGNOSIS — J441 Chronic obstructive pulmonary disease with (acute) exacerbation: Secondary | ICD-10-CM | POA: Diagnosis present

## 2017-09-28 DIAGNOSIS — Z789 Other specified health status: Secondary | ICD-10-CM

## 2017-09-28 DIAGNOSIS — T380X5A Adverse effect of glucocorticoids and synthetic analogues, initial encounter: Secondary | ICD-10-CM | POA: Diagnosis present

## 2017-09-28 DIAGNOSIS — J201 Acute bronchitis due to Hemophilus influenzae: Secondary | ICD-10-CM

## 2017-09-28 DIAGNOSIS — I252 Old myocardial infarction: Secondary | ICD-10-CM

## 2017-09-28 DIAGNOSIS — I110 Hypertensive heart disease with heart failure: Secondary | ICD-10-CM | POA: Diagnosis present

## 2017-09-28 DIAGNOSIS — E46 Unspecified protein-calorie malnutrition: Secondary | ICD-10-CM | POA: Diagnosis present

## 2017-09-28 DIAGNOSIS — J449 Chronic obstructive pulmonary disease, unspecified: Secondary | ICD-10-CM

## 2017-09-28 DIAGNOSIS — D62 Acute posthemorrhagic anemia: Secondary | ICD-10-CM | POA: Diagnosis present

## 2017-09-28 LAB — GLUCOSE, CAPILLARY
GLUCOSE-CAPILLARY: 94 mg/dL (ref 65–99)
Glucose-Capillary: 95 mg/dL (ref 65–99)

## 2017-09-28 LAB — BASIC METABOLIC PANEL
Anion gap: 4 — ABNORMAL LOW (ref 5–15)
BUN: 20 mg/dL (ref 6–20)
CHLORIDE: 108 mmol/L (ref 101–111)
CO2: 24 mmol/L (ref 22–32)
Calcium: 8.8 mg/dL — ABNORMAL LOW (ref 8.9–10.3)
Creatinine, Ser: 0.6 mg/dL (ref 0.44–1.00)
GFR calc Af Amer: >60 mL/min (ref >60)
GFR calc non Af Amer: >60 mL/min (ref >60)
Glucose, Bld: 98 mg/dL (ref 65–99)
Potassium: 3.8 mmol/L (ref 3.5–5.1)
SODIUM: 136 mmol/L (ref 135–145)

## 2017-09-28 MED ORDER — ARFORMOTEROL TARTRATE 15 MCG/2ML IN NEBU: 15.0000 ug | INHALATION_SOLUTION | Freq: Two times a day (BID) | RESPIRATORY_TRACT | 0 refills | Status: DC

## 2017-09-28 MED ORDER — POLYETHYLENE GLYCOL 3350 17 G PO PACK: 17.0000 g | PACK | Freq: Every day | ORAL | Status: DC | PRN

## 2017-09-28 MED ORDER — METHOCARBAMOL 500 MG PO TABS
500.0000 mg | ORAL_TABLET | Freq: Four times a day (QID) | ORAL | Status: DC | PRN
  Administered 2017-09-30 – 2017-10-03 (×2): 500 mg via ORAL
  Filled 2017-09-28 (×2): qty 1

## 2017-09-28 MED ORDER — PANTOPRAZOLE SODIUM 40 MG PO PACK
40.0000 mg | PACK | Freq: Every day | ORAL | Status: DC
  Administered 2017-09-29: 40 mg via ORAL

## 2017-09-28 MED ORDER — GUAIFENESIN-DM 100-10 MG/5ML PO SYRP: 5.0000 mL | ORAL_SOLUTION | Freq: Four times a day (QID) | ORAL | Status: DC | PRN

## 2017-09-28 MED ORDER — NICOTINE 7 MG/24HR TD PT24: 7.0000 mg | MEDICATED_PATCH | Freq: Every day | TRANSDERMAL | 0 refills | Status: DC

## 2017-09-28 MED ORDER — PROCHLORPERAZINE MALEATE 5 MG PO TABS
5.0000 mg | ORAL_TABLET | Freq: Four times a day (QID) | ORAL | Status: DC | PRN
Start: 2017-09-28 — End: 2017-10-04

## 2017-09-28 MED ORDER — VITAMIN B-1 100 MG PO TABS
100.0000 mg | ORAL_TABLET | Freq: Every day | ORAL | Status: DC
  Administered 2017-09-29 – 2017-10-04 (×6): 100 mg via ORAL
  Filled 2017-09-28 (×6): qty 1

## 2017-09-28 MED ORDER — FLEET ENEMA 7-19 GM/118ML RE ENEM: 1.0000 | ENEMA | Freq: Once | RECTAL | Status: DC | PRN

## 2017-09-28 MED ORDER — GUAIFENESIN 100 MG/5ML PO SOLN
10.0000 mL | Freq: Three times a day (TID) | ORAL | Status: DC
  Administered 2017-09-28: 200 mg via ORAL
  Filled 2017-09-28: qty 10

## 2017-09-28 MED ORDER — BISACODYL 10 MG RE SUPP
10.0000 mg | Freq: Every day | RECTAL | Status: DC | PRN
Start: 2017-09-28 — End: 2017-10-04

## 2017-09-28 MED ORDER — PREDNISONE 2.5 MG PO TABS: 2.5000 mg | ORAL_TABLET | Freq: Every day | ORAL | 0 refills | Status: DC

## 2017-09-28 MED ORDER — GUAIFENESIN 100 MG/5ML PO SOLN
10.0000 mL | Freq: Three times a day (TID) | ORAL | Status: DC
  Administered 2017-09-28 – 2017-10-04 (×17): 200 mg via ORAL
  Filled 2017-09-28: qty 5
  Filled 2017-09-28: qty 10
  Filled 2017-09-28 (×3): qty 5
  Filled 2017-09-28: qty 25
  Filled 2017-09-28: qty 5
  Filled 2017-09-28: qty 10
  Filled 2017-09-28 (×4): qty 5
  Filled 2017-09-28 (×3): qty 10
  Filled 2017-09-28 (×3): qty 5

## 2017-09-28 MED ORDER — AMOXICILLIN-POT CLAVULANATE 400-57 MG/5ML PO SUSR
800.0000 mg | Freq: Two times a day (BID) | ORAL | Status: AC
  Administered 2017-09-29: 800 mg via ORAL
  Filled 2017-09-28 (×2): qty 10

## 2017-09-28 MED ORDER — VITAMIN B-1 100 MG PO TABS
100.0000 mg | ORAL_TABLET | Freq: Every day | ORAL | Status: DC
  Administered 2017-09-28: 100 mg via ORAL
  Filled 2017-09-28: qty 1

## 2017-09-28 MED ORDER — ENOXAPARIN SODIUM 40 MG/0.4ML ~~LOC~~ SOLN
40.0000 mg | SUBCUTANEOUS | Status: DC
  Administered 2017-09-28 – 2017-10-03 (×6): 40 mg via SUBCUTANEOUS
  Filled 2017-09-28 (×5): qty 0.4

## 2017-09-28 MED ORDER — AMOXICILLIN-POT CLAVULANATE 400-57 MG/5ML PO SUSR
800.0000 mg | Freq: Two times a day (BID) | ORAL | Status: DC
  Administered 2017-09-28: 800 mg via ORAL

## 2017-09-28 MED ORDER — PREDNISONE 10 MG PO TABS: 10.0000 mg | ORAL_TABLET | Freq: Every day | ORAL | Status: DC

## 2017-09-28 MED ORDER — PROCHLORPERAZINE 25 MG RE SUPP
12.5000 mg | Freq: Four times a day (QID) | RECTAL | Status: DC | PRN
  Filled 2017-09-28: qty 1

## 2017-09-28 MED ORDER — ALUM & MAG HYDROXIDE-SIMETH 200-200-20 MG/5ML PO SUSP
30.0000 mL | ORAL | Status: DC | PRN
  Administered 2017-09-30: 30 mL via ORAL
  Filled 2017-09-28: qty 30

## 2017-09-28 MED ORDER — NICOTINE 7 MG/24HR TD PT24
7.0000 mg | MEDICATED_PATCH | Freq: Every day | TRANSDERMAL | Status: DC
  Administered 2017-09-29 – 2017-10-04 (×6): 7 mg via TRANSDERMAL
  Filled 2017-09-28 (×6): qty 1

## 2017-09-28 MED ORDER — PREDNISONE 10 MG PO TABS: 10.0000 mg | ORAL_TABLET | Freq: Every day | ORAL | 0 refills | Status: DC

## 2017-09-28 MED ORDER — BUDESONIDE 0.5 MG/2ML IN SUSP: 0.5000 mg | Freq: Two times a day (BID) | RESPIRATORY_TRACT | 0 refills | Status: DC

## 2017-09-28 MED ORDER — TRAZODONE HCL 50 MG PO TABS
25.0000 mg | ORAL_TABLET | Freq: Every evening | ORAL | Status: DC | PRN
  Administered 2017-09-29 (×2): 50 mg via ORAL
  Administered 2017-09-30 – 2017-10-01 (×2): 25 mg via ORAL
  Filled 2017-09-28 (×4): qty 1

## 2017-09-28 MED ORDER — BUDESONIDE 0.5 MG/2ML IN SUSP
0.5000 mg | Freq: Two times a day (BID) | RESPIRATORY_TRACT | Status: DC
  Administered 2017-09-28 – 2017-10-04 (×10): 0.5 mg via RESPIRATORY_TRACT
  Filled 2017-09-28 (×12): qty 2

## 2017-09-28 MED ORDER — PROCHLORPERAZINE EDISYLATE 5 MG/ML IJ SOLN
5.0000 mg | Freq: Four times a day (QID) | INTRAMUSCULAR | Status: DC | PRN
Start: 2017-09-28 — End: 2017-10-04

## 2017-09-28 MED ORDER — PREDNISONE 5 MG PO TABS: 5.0000 mg | ORAL_TABLET | Freq: Every day | ORAL | 0 refills | Status: DC

## 2017-09-28 MED ORDER — ARFORMOTEROL TARTRATE 15 MCG/2ML IN NEBU
15.0000 ug | INHALATION_SOLUTION | Freq: Two times a day (BID) | RESPIRATORY_TRACT | Status: DC
  Administered 2017-09-28 – 2017-10-04 (×10): 15 ug via RESPIRATORY_TRACT
  Filled 2017-09-28 (×12): qty 2

## 2017-09-28 MED ORDER — ACETAMINOPHEN 325 MG PO TABS
325.0000 mg | ORAL_TABLET | ORAL | Status: DC | PRN
Start: 2017-09-28 — End: 2017-10-04
  Administered 2017-10-02 (×2): 650 mg via ORAL
  Filled 2017-09-28 (×2): qty 2

## 2017-09-28 MED ORDER — IPRATROPIUM-ALBUTEROL 0.5-2.5 (3) MG/3ML IN SOLN
3.0000 mL | RESPIRATORY_TRACT | Status: DC | PRN
  Administered 2017-10-02: 3 mL via RESPIRATORY_TRACT
  Filled 2017-09-28: qty 3

## 2017-09-28 MED ORDER — AMOXICILLIN-POT CLAVULANATE 875-125 MG PO TABS: 1.0000 | ORAL_TABLET | Freq: Two times a day (BID) | ORAL | 0 refills | Status: DC

## 2017-09-28 MED ORDER — PANTOPRAZOLE SODIUM 40 MG PO PACK
40.0000 mg | PACK | Freq: Every day | ORAL | Status: DC
  Administered 2017-09-28: 40 mg via ORAL
  Filled 2017-09-28: qty 20

## 2017-09-28 MED ORDER — PREDNISONE 5 MG PO TABS
10.0000 mg | ORAL_TABLET | Freq: Every day | ORAL | Status: AC
  Administered 2017-09-29 – 2017-09-30 (×2): 10 mg via ORAL
  Filled 2017-09-28 (×2): qty 2

## 2017-09-28 MED ORDER — DIPHENHYDRAMINE HCL 12.5 MG/5ML PO ELIX: 12.5000 mg | ORAL_SOLUTION | Freq: Four times a day (QID) | ORAL | Status: DC | PRN

## 2017-09-28 NOTE — Progress Notes (Signed)
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  She also has a 30-pack-year history of smoking and continues to smoke.  She been living with her sister and making progress for rehabilitation standpoint.  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  She took her rescue bronchodilators without much relief because of this she went to the emergency room.  She was given bronchodilator therapy, and sent home with a prescription of prednisone.  Her symptoms did not get better, she returned to the emergency room on the 24th with resting shortness of breath, and almost continuous use of her rescue bronchodilator without relief.  Her initial chest x-ray was without infiltrate or evidence of edema, she had no sick exposures, no fever or chills.  She was admitted with a working diagnosis of acute exacerbation chronic obstructive pulmonary disease, and treated appropriately with supplemental oxygen, inhaled bronchodilators, and systemic steroids.  On the evening of 10/25 she developed acute distress, was found by nursing staff tripod in bed.  Was treated with IV Lasix, pulse IV steroid, and placed on noninvasive positive pressure ventilation.  Throughout the course of the evening and going into the a.m. hours of 10/26.  Her work of breathing has remained quite elevated.  She has been on and off BiPAP, continues to have cough, continues to wheeze, has not been getting much relief from rescue bronchodilation.  Critical care was asked to  see because of work of breathing, noninvasive positive pressure ventilation requirements, and concern about clinical deterioration.   EVENTS 10/24 admit 10/26 intubated 11/2 tracheotomy placed  SUBJECTIVE/OVERNIGHT/INTERVAL HX 10/28 Unable to wean due to agitation and persistent bronchospasm. 10/29 agitation overnight, also with bradycardia down to 30s so precedex and propofol held.  Nose just dictating his name She was noted to have pauses on cardiac monitoring which may be consistent with heart block. 10/31 no acute events overnight. Patient sedated on fentanyl versed and propofol. 11/1 no acute events, remains sedated on fentanyl, versed, and propofol 11/2 hypotension overnight improved with d/c prop and initiation of bolus followed by change in mentation improved with restarting prop. Tachycardic event resolved with advancement of ETT. Plan to trach. Improved aeration of R-lung base on CXR with stable tube placement, mild L-base atelectasis remains 11/3 failed weaning this AM due to lack of effort 11/4 TC x15 minutes so far, no events overnight 11/5 Full vent support overnight with wean on TC, remains altered overnight despite methadone 11/6 Remained on TC overnight. Required soft restraints for agitation.  11/7 Decreasing prednisone, stable on TC, started Augmentin for haemophilus tracheobronchitis/pneumonia 11/8 Remains stable on TC, afebrile on Augmentin with improved WBC 11/9 fell out of bed last night.  Otherwise no acute distress no issues 11/12 on TC on RA with no events, trach changed to 6 cuffless 11/13 some pain in the neck from trach change but no other events overnight, tolerated capping  VITAL SIGNS: BP 91/61 (BP Location: Left Arm)   Pulse 96   Temp 98.9 F (37.2 C) (Oral)   Resp 20   Ht  5\' 9"  (1.753 m)   Wt 54.6 kg (120 lb 4.8 oz)   LMP 09/09/2014 (Approximate)   SpO2 100%   BMI 17.77 kg/m   HEMODYNAMICS:    VENTILATOR SETTINGS:    INTAKE / OUTPUT: I/O  last 3 completed shifts: In: 1634.9 [P.O.:710; NG/GT:924.9] Out: -    EXAM: General: Chronically ill appearing, NAD HEENT: [place]/AT, PERRL, EOM-I and cuffless trach size 6 in place now Pulmonary: CTA bilaterally but decreased BS Cardiac: RRR, Nl S1/S2, -M/R/G. Abdomen: Soft, NT, ND and +BS Extremities/musculoskeletal: -edema and -tenderness Neuro/psych: Awake, alert, follows commands, interactive, moves all extremities.  Remains impulsive.  LABS  PULMONARY No results for input(s): PHART, PCO2ART, PO2ART, HCO3, TCO2, O2SAT in the last 168 hours.  Invalid input(s): PCO2, PO2 CBC Recent Labs  Lab 09/22/17 0411 09/23/17 0221 09/24/17 0237  HGB 11.1* 12.2 10.9*  HCT 33.9* 37.0 33.3*  WBC 18.5* 15.7* 14.1*  PLT 285 319 291   COAGULATION No results for input(s): INR in the last 168 hours.   CARDIAC No results for input(s): TROPONINI in the last 168 hours. No results for input(s): PROBNP in the last 168 hours.  CHEMISTRY Recent Labs  Lab 09/22/17 0411 09/23/17 0221 09/24/17 0237 09/25/17 0206 09/26/17 0445 09/27/17 0442 09/28/17 0432  NA 139 139 138 137 137 138 136  K 3.3* 3.6 3.3* 4.1 3.6 3.3* 3.8  CL 105 105 107 107 107 106 108  CO2 27 28 23 24 24 24 24   GLUCOSE 111* 103* 115* 107* 106* 109* 98  BUN 18 21* 22* 22* 24* 29* 20  CREATININE 0.49 0.55 0.47 0.50 0.46 0.53 0.60  CALCIUM 8.5* 8.7* 8.7* 8.9 8.9 8.7* 8.8*  MG 2.0 2.2 1.9  --   --   --   --   PHOS 3.6 3.9 4.1  --   --   --   --    Estimated Creatinine Clearance: 70.1 mL/min (by C-G formula based on SCr of 0.6 mg/dL).  LIVER Recent Labs  Lab 09/22/17 0411 09/23/17 0221 09/24/17 0237  ALBUMIN 2.3* 2.5* 2.4*   INFECTIOUS No results for input(s): LATICACIDVEN, PROCALCITON in the last 168 hours. ENDOCRINE CBG (last 3)  Recent Labs    09/27/17 1702 09/27/17 2019 09/28/17 0754  GLUCAP 89 108* 95   I reviewed CXR myself, hyperinflation and trach is in good position  ASSESSMENT and PLAN  Acute  on chronic hypercarbic respiratory failure, COPD: improving on TC Tobacco use disorder HFpEF (LVEF 55%), BNP 50.8 > 201.1 Primary hypertension Hypotension, off Neo gtt since 11/4 Sinus tachycardia AKI resolved Hypokalemia, resolved Nutrition Acute encephalopathy Hyperthyroidism, TSH 0.158, FT4 1.18 Leukocytosis Haemophilus tracheobronchitis/pneumonia: found on resp cx 11/4  Pulmonary problem list: Acute on chronic hypercarbic respiratory failure in the setting of acute exacerbation of chronic obstructive pulmonary disease, and Haemophilus influenza tracheobronchitis/pneumonia 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  [name], M.D. [facility] Pulmonary/Critical Care Medicine. Pager: [phone]. After  hours pager: [phone].

## 2017-09-28 NOTE — Discharge Summary (Signed)
Name: Caitlin [name] MRN: [id number] DOB: 1965/12/19 51 y.o. PCP: [name], MD  Date of Admission: 09/08/2017  9:50 AM Date of Discharge: 09/28/2017 Attending Physician: Bartholomew Crews, MD  Discharge Diagnosis: 1. Acute hypoxic respiratory failure  2. Suspected COPD  3. Altered mental status  4. Haemophilus influenzae tracheobronchitis  5. Hx of stress-induced cardiomyopathy   Principal Problem:   COPD exacerbation ([place]) Active Problems:   Acute combined systolic and diastolic heart failure (HCC)   Acute on chronic respiratory failure (HCC)   Shortness of breath   AKI (acute kidney injury) ([place])   Encounter for care related to feeding tube   No contraindication to deep vein thrombosis (DVT) prophylaxis   At risk for stress ulcer   Acute respiratory failure with hypoxia (HCC)   Hypotension   SVT (supraventricular tachycardia) (HCC)   Tracheostomy status (HCC)   Delirium   Chronic combined systolic and diastolic congestive heart failure (HCC)   Tobacco abuse   Steroid-induced hyperglycemia   Hypokalemia   Leukocytosis   Discharge Medications: Allergies as of 09/28/2017   No Known Allergies     Medication List    STOP taking these medications   doxycycline 100 MG capsule Commonly known as:  VIBRAMYCIN   furosemide 20 MG tablet Commonly known as:  LASIX     TAKE these medications   albuterol 108 (90 Base) MCG/ACT inhaler Commonly known as:  PROVENTIL HFA;VENTOLIN HFA Inhale 2 puffs into the lungs every 4 (four) hours as needed for wheezing or shortness of breath.   albuterol (2.5 MG/3ML) 0.083% nebulizer solution Commonly known as:  PROVENTIL Take 3 mLs (2.5 mg total) by nebulization every 6 (six) hours as needed for wheezing or shortness of breath.   amoxicillin-clavulanate 875-125 MG tablet Commonly known as:  AUGMENTIN Take 1 tablet 2 (two) times daily for 2 days by mouth.   arformoterol 15 MCG/2ML Nebu Commonly known as:   BROVANA Take 2 mLs (15 mcg total) 2 (two) times daily by nebulization.   budesonide 0.5 MG/2ML nebulizer solution Commonly known as:  PULMICORT Take 2 mLs (0.5 mg total) 2 (two) times daily by nebulization.   ipratropium 0.02 % nebulizer solution Commonly known as:  ATROVENT Take 2.5 mLs (0.5 mg total) by nebulization 4 (four) times daily.   lisinopril 10 MG tablet Commonly known as:  PRINIVIL,ZESTRIL Take 1 tablet (10 mg total) by mouth daily.   metoprolol succinate 25 MG 24 hr tablet Commonly known as:  TOPROL-XL Take 1 tablet (25 mg total) by mouth daily.   nicotine 7 mg/24hr patch Commonly known as:  NICODERM CQ - dosed in mg/24 hr Place 1 patch (7 mg total) daily onto the skin. Start taking on:  09/29/2017   predniSONE 5 MG tablet Commonly known as:  DELTASONE Take 1 tablet (5 mg total) daily for 7 doses by mouth. starting on 11/16 to 11/22. What changed:    medication strength  how much to take  how to take this  when to take this  additional instructions   predniSONE 2.5 MG tablet Commonly known as:  DELTASONE Take 1 tablet (2.5 mg total) daily for 14 days by mouth. start on 11/23 to 12/6 What changed:  You were already taking a medication with the same name, and this prescription was added. Make sure you understand how and when to take each.   predniSONE 10 MG tablet Commonly known as:  DELTASONE Take 1 tablet (10 mg total) daily with breakfast by mouth.  On 11/14 and 11/15 Start taking on:  09/29/2017 What changed:    medication strength  how much to take  when to take this  additional instructions   vitamin B-12 100 MCG tablet Commonly known as:  CYANOCOBALAMIN Take 100 mcg by mouth daily.       Disposition and follow-up:   [name] was discharged from [facility] in Stable condition.  At the hospital follow up visit please address:  1.  Please assess respiratory status. Please assess compliance with steroid taper.    2. Please assess volume status. Home Lasix d/c during admission as patient was euvolemic and EF recovered to 55% on repeat TTE. Consider restarting Lasix if hypervolemic.    3. Will need outpatient PFTs once stable and off trach.   4.  Labs / imaging needed at time of follow-up: None   5.  Pending labs/ test needing follow-up: None   Follow-up Appointments: [place] Follow up.   Contact information: [place] [address] [place] [facility] up.   Contact information: [address] [place] [address] [phone]        [facility].   Specialty:  Home Health Services Contact information: [place] [address] [phone]        [name], MD. Call.   Specialty:  Internal Medicine Why:  Please call to make a follow up appointment with your doctor when you finish rehabilitation  Contact information: [place] [address] [phone]           Hospital Course by problem list: Principal Problem:   COPD exacerbation ([place]) Active Problems:   Acute combined systolic and diastolic heart failure (HCC)   Acute on chronic respiratory failure (HCC)   Shortness of breath   AKI (acute kidney injury) ([place])   Encounter for care related to feeding tube   No contraindication to deep vein thrombosis (DVT) prophylaxis   At risk for stress ulcer   Acute respiratory failure with hypoxia (HCC)   Hypotension   SVT (supraventricular tachycardia) ([place])   Tracheostomy status ([place])   Delirium   Chronic combined systolic and diastolic congestive heart failure (HCC)   Tobacco abuse   Steroid-induced hyperglycemia   Hypokalemia   Leukocytosis   1. Acute hypoxic respiratory failure: Patient presented with acute onset SOB, productive cough, and hypoxia suspected to be secondary to COPD  exacerbation given 30 pack-year smoking history, though no formal testing, vs PNA/bronchitis. She was initially treated with scheduled bronchodilators, prednisone, supplemental oxygen and required NIPPV due to worsening hypoxia. Unfortunately patient's respiratory status continued to decompensate and she required intubation (10/26) and transfer to the ICU. Her ICU course was complicated by altered mental status requiring soft restraints, prolonged intubation requiring trach collar (extubated 11/2),and Haemophilus influenzae tracheobronchitis/ PNA seen on tracheal aspirate. She was started on tube feeds, slow prednisone taper and Augmentin x 7 days. She was transfer out of there ICU on 11/9 and did well. Able to tolerate PO intake, trach capped for 24h, and satting well on RA on day of discharge. Plan for decannulation on 11/16 if able to tolerate capped trach for 48h.   2. Altered mental status: Patient fully alert and oriented on presentation to the ED. Became agitated and somnolent while in the ICU and required  scheduled Haldol with prn Seroquel as well as soft restraints. She had one fall during admission without complications, head CT negative. AMS initially thought to be secondary to substance withdrawal, but no hx of substance abuse and UDS negtaive. This was likely secondary to a combination of infection, sedative medications, and delirium. Mental status back to baseline on 11/9.   3. Stress-induced cardiomyopathy: Patient recently admitted on 06/2017 for suspected stress-induced cardiomyopathy. Repeat TTE this admission showed improved EF of 55-60% (45% prior) with normal wall motion. Remained euvolemic during admission. Home Lasix held and stopped on discharge day. Will need volume status assessment to determine need for Lasix.    Discharge Vitals:   BP 91/61 (BP Location: Left Arm)   Pulse 96   Temp 98.9 F (37.2 C) (Oral)   Resp 20   Ht 5\' 9"  (1.753 m)   Wt 120 lb 4.8 oz (54.6 kg)   LMP  09/09/2014 (Approximate)   SpO2 100%   BMI 17.77 kg/m   Pertinent Labs, Studies, and Procedures:   Head CT: IMPRESSION: 1. No acute intracranial process. 2. Minimal chronic small vessel ischemic disease.  CXR 11/2: IMPRESSION: 1. Interval removal of endotracheal tube. Interim removal of NG tube. Interim placement of tracheostomy tube. Tracheostomy tube in good anatomic position. Right IJ line in stable position.  2. Low lung volumes with mild bibasilar atelectasis/infiltrates . COPD.  TTE 10/26: Study Conclusions - Left ventricle: The cavity size was normal. Wall thickness was   normal. Systolic function was normal. The estimated ejection   fraction was in the range of 55% to 60%. Wall motion was normal;   there were no regional wall motion abnormalities. The study is   not technically sufficient to allow evaluation of LV diastolic   function. - Tricuspid valve: There was moderate regurgitation. - Pulmonary arteries: Systolic pressure was mildly increased. PA   peak pressure: 35 mm Hg (S). - Pericardium, extracardiac: A small pericardial effusion was   identified at the apex. There was no evidence of hemodynamic   compromise.   Discharge Instructions: Discharge Instructions    Call MD for:  difficulty breathing, headache or visual disturbances   Complete by:  As directed    Call MD for:  persistant dizziness or light-headedness   Complete by:  As directed    Call MD for:  temperature >100.4   Complete by:  As directed    Diet - low sodium heart healthy   Complete by:  As directed    Increase activity slowly   Complete by:  As directed       Signed: [name], MD 09/28/2017, 11:16 AM   Pager: [phone]

## 2017-09-28 NOTE — Progress Notes (Signed)
Pt is no more on tube feeding CBG check is now ACHS not on Q4 anymore, asked for sleeping pill paged PCCM no one has responded yet

## 2017-09-28 NOTE — Progress Notes (Signed)
CSW notes that patient is going to CIR.   CSW signing off.  Percell Locus Labrina Lines LCSWA [phone]

## 2017-09-28 NOTE — PMR Pre-admission (Signed)
PMR Admission Coordinator Pre-Admission Assessment  Patient: Caitlin [name] is an 51 y.o., female MRN: [id number] DOB: 1966-09-06 Height: 5\' 9"  (175.3 cm) Weight: 54.6 kg (120 lb 4.8 oz)              Insurance Information HMO:     PPO:      PCP:      IPA:      80/20:      OTHER:  PRIMARY: Uninsured/Self-pay      Policy#:       Subscriber:  CM Name:       Phone#:      Fax#:  Pre-Cert#:       Employer:  Benefits:  Phone #:      Name:  Eff. Date:      Deduct:       Out of Pocket Max:       Life Max:  CIR:       SNF:  Outpatient:      Co-Pay:  Home Health:       Co-Pay:  DME:      Co-Pay:  Providers:   Medicaid Application Date:       Case Manager:  Disability Application Date:       Case Worker:  * patient reports that she has been in contact with a financial counselor   Emergency Contact Information Contact Information    Name Relation Home Work Mobile   Duff,Curtis Son   [phone]   Duff,Michael Son   [phone]   Duff,Tabitha Sister   [phone]   Duff,Marie Sister   [phone]     Current Medical History  Patient Admitting Diagnosis: Debility  History of Present Illness: Caitlin [name] is a 51 y.o. 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 History taken from chart review and patient. Patient with admission 8/30- 07/20/17 when she was diagnosed with CM and she has been living with sister to help regain strength due to debility from recent medical issues. She was able to ambulate without AD and she was getting back to doing household chores. Upon this admission, she was stared on bronchodilators and steroids but declined on 10/25 with increased WOB requiring intubation. She had difficulty with vent wean due to agitation and ultimately required tracheostomy prior to being weaned to ATC. Hospital course significant for Hemophilus tracheobronchitis/PNA treated with Augmentin, hypotension, delirium and  made NPO due aspiration risk with signs of decreased airway protection. Trach changed to a Shiley 6 cuffless with capping trials initiated 09/27/17 with possible decannulation in soon if able to tolerate. Prednisone being tapered off. MBS done 09/27/17 and a regular diet with thin liquids initiated with cap or PMSV in place. She was noted to be severely deconditioned on PT evaluation and CIR recommended due to decline in functional status. Patient admitted to Inpatient Rehabilitation 09/28/17.       Past Medical History  Past Medical History:  Diagnosis Date  . CHF (congestive heart failure) ([place])   . COPD (chronic obstructive pulmonary disease) ([place])   . NSTEMI (non-ST elevated myocardial infarction) ([place])     Family History  family history includes Aneurysm in her father; Heart disease in her mother; Hypertension in her mother.  Prior Rehab/Hospitalizations:  Has the patient had major surgery during 100 days prior to admission? No  Current Medications   Current Facility-Administered Medications:  .  amoxicillin-clavulanate (AUGMENTIN) 400-57 MG/5ML suspension 800 mg,  800 mg, Oral, Q12H, Santos-Sanchez, Idalys, MD, 800 mg at 09/28/17 0946 .  arformoterol (BROVANA) nebulizer solution 15 mcg, 15 mcg, Nebulization, BID, Corey Harold, NP, 15 mcg at 09/28/17 0730 .  budesonide (PULMICORT) nebulizer solution 0.5 mg, 0.5 mg, Nebulization, BID, Corey Harold, NP, 0.5 mg at 09/28/17 0730 .  chlorhexidine (PERIDEX) 0.12 % solution 15 mL, 15 mL, Mouth Rinse, BID, Rigoberto Noel, MD, 15 mL at 09/28/17 0940 .  enoxaparin (LOVENOX) injection 40 mg, 40 mg, Subcutaneous, Q24H, Hoffman, Jessica Ratliff, DO, 40 mg at 09/27/17 1720 .  guaiFENesin (ROBITUSSIN) 100 MG/5ML solution 200 mg, 10 mL, Oral, TID, Santos-Sanchez, Idalys, MD, 200 mg at 09/28/17 0940 .  insulin aspart (novoLOG) injection 0-9 Units, 0-9 Units, Subcutaneous, Q4H, Javier Glazier, MD, 1 Units at 09/26/17 1212 .   ipratropium-albuterol (DUONEB) 0.5-2.5 (3) MG/3ML nebulizer solution 3 mL, 3 mL, Nebulization, Q4H PRN, Javier Glazier, MD, 3 mL at 09/27/17 1520 .  ketorolac (TORADOL) 15 MG/ML injection 15 mg, 15 mg, Intravenous, Q8H PRN, Velna Ochs, MD, 15 mg at 09/26/17 1046 .  MEDLINE mouth rinse, 15 mL, Mouth Rinse, q12n4p, Kara Mead V, MD, 15 mL at 09/27/17 1721 .  nicotine (NICODERM CQ - dosed in mg/24 hr) patch 7 mg, 7 mg, Transdermal, Daily, Javier Glazier, MD, 7 mg at 09/28/17 0940 .  pantoprazole sodium (PROTONIX) 40 mg/20 mL oral suspension 40 mg, 40 mg, Oral, Daily, Santos-Sanchez, Idalys, MD, 40 mg at 09/28/17 0940 .  phenol (CHLORASEPTIC) mouth spray 1 spray, 1 spray, Mouth/Throat, PRN, Omar Person, NP, 1 spray at 09/27/17 1719 .  [START ON 09/29/2017] predniSONE (DELTASONE) tablet 10 mg, 10 mg, Oral, Q breakfast, Santos-Sanchez, Idalys, MD .  thiamine (VITAMIN B-1) tablet 100 mg, 100 mg, Oral, Daily, Santos-Sanchez, Idalys, MD, 100 mg at 09/28/17 0940  Patients Current Diet: Diet regular Room service appropriate? 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  Just prior to this admission she was regaining her strength and about ready to return to her own home.  She is eager to get back there and has the support of her sister and niece until that time.  She wishes to be active and independent again.  Home Assistive Devices / Equipment Home Assistive Devices/Equipment: None  Prior Device Use: Indicate devices/aids used by the patient prior to current illness, exacerbation or injury? None of the above  Prior  Functional Level Prior Function Level of Independence: Independent Comments: completely independent with all ADLs and IADLs prior to admission  Self Care: Did the patient need help bathing, dressing, using the toilet or eating? Independent  Indoor Mobility: Did the patient need assistance with walking from room to room (with or without device)? Independent  Stairs: Did the patient need assistance with internal or external stairs (with or without device)? Independent  Functional Cognition: Did the patient need help planning regular tasks such as shopping or remembering to take medications? Independent  Current Functional Level Cognition  Overall Cognitive Status: Within Functional Limits for tasks assessed Orientation Level: Oriented X4    Extremity Assessment (includes Sensation/Coordination)  Upper Extremity Assessment: Generalized weakness  Lower  Extremity Assessment: Defer to PT evaluation    ADLs  Overall ADL's : Needs assistance/impaired Eating/Feeding: NPO Eating/Feeding Details (indicate cue type and reason): NG tube Grooming: Minimal assistance, Sitting Upper Body Bathing: Moderate assistance, Sitting Lower Body Bathing: Moderate assistance Upper Body Dressing : Minimal assistance, Sitting Lower Body Dressing: Minimal assistance, Sit to/from stand Toilet Transfer: Minimal assistance, Stand-pivot, RW Toileting- Clothing Manipulation and Hygiene: Minimal assistance Tub/ Shower Transfer: Minimal assistance, Stand-pivot, 3 in 1, Rolling walker General ADL Comments: Pt completed bed mobility and SPT EOB>recliner to simulate toilet transfer.     Mobility  Overal bed mobility: Needs Assistance Bed Mobility: Supine to Sit Supine to sit: Supervision Sit to supine: Supervision General bed mobility comments: supervision, increased time    Transfers  Overall transfer level: Needs assistance Equipment used: None Transfers: Sit to/from Stand, Stand Pivot Transfers Sit to  Stand: Min assist Stand pivot transfers: Min assist General transfer comment: performed transfers with and without RW, min A    Ambulation / Gait / Stairs / Wheelchair Mobility  Ambulation/Gait Ambulation/Gait assistance: Museum/gallery curator (Feet): 100 Feet Assistive device: None General Gait Details: min A gait without RW x 100', occasional mod A to correct LOB, mild ataxia/scissoring.  gait then performed with RW with pt stating she feels more comfortable, min A for turning and taking steps backward    Posture / Balance Balance Overall balance assessment: Needs assistance Sitting-balance support: No upper extremity supported, Feet supported Sitting balance-Leahy Scale: Fair Standing balance support: Bilateral upper extremity supported, During functional activity Standing balance-Leahy Scale: Poor Standing balance comment: min A    Special needs/care consideration BiPAP/CPAP: CPAP CPM: No Continuous Drip IV: No Dialysis: No Life Vest: No Oxygen: No, but monitoring oxygen saturation  Special Bed: No Trach Size: Shiley, 6 uncuffed with capping trials in progress Wound Vac (area): No       Skin: Dry, Abrasion to left elbow                               Bowel mgmt: Continent, last BM 09/27/17 Bladder mgmt: Continent  Diabetic mgmt: No     Previous Home Environment Living Arrangements: Other relatives Available Help at Discharge: Family, Available PRN/intermittently Home Care Services: No  Discharge Living Setting Plans for Discharge Living Setting: Lives with (comment)(sister and niece) Type of Home at Discharge: House Discharge Home Layout: Two level, Able to live on main level with bedroom/bathroom Alternate Level Stairs-Number of Steps: 1 flight  Discharge Home Access: Stairs to enter Entrance Stairs-Rails: Can reach both Entrance Stairs-Number of Steps: 6 Discharge Bathroom Shower/Tub: Tub/shower unit, Curtain(has a bench ) Discharge Bathroom Toilet:  Standard Discharge Bathroom Accessibility: Yes How Accessible: Accessible via walker Does the patient have any problems obtaining your medications?: Yes (Describe)(reports that ex-husband payes for her meds, long term TBD)  Social/Family/Support Systems Patient Roles: Parent, Other (Comment)(Sister) Contact Information: Sister, Renata Caprice Anticipated Caregiver: Sister and niece when sister is at work  Anticipated Ambulance person Information: [phone] Ability/Limitations of Caregiver: Sister works nights  Caregiver Availability: 24/7 Discharge Plan Discussed with Primary Caregiver: (discussed with patient ) Is Caregiver In Agreement with Plan?: (patient in agreement and report's sister is in agreement ) Does Caregiver/Family have Issues with Lodging/Transportation while Pt is in Rehab?: No  Goals/Additional Needs Patient/Family Goal for Rehab: PT/OT Mod I-Supervision; SLP Mod I  Expected length of stay: 5-9 days  Cultural Considerations: None Dietary Needs: Regular textures  and thin liquids  Equipment Needs: TBD Special Service Needs: Trach capping initiated with plans to decannulate  Additional Information: None Pt/Family Agrees to Admission and willing to participate: Yes Program Orientation Provided & Reviewed with Pt/Caregiver Including Roles  & Responsibilities: Yes Additional Information Needs: N/A  Barriers to Discharge: Trach  Decrease burden of Care through IP rehab admission: No  Possible need for SNF placement upon discharge: No  Patient Condition: This patient's condition remains as documented in the consult dated 09/27/17 at 1459, in which the Rehabilitation Physician determined and documented that the patient's condition is appropriate for intensive rehabilitative care in an inpatient rehabilitation facility. Will admit to inpatient rehab today.  Preadmission Screen Completed By:  Gunnar Fusi, 09/28/2017 11:09  AM ______________________________________________________________________   Discussed status with Dr. Posey Pronto on 09/28/17 at 1116 and received telephone approval for admission today.  Admission Coordinator:  Gunnar Fusi, time 1116/Date 09/28/17

## 2017-09-28 NOTE — Discharge Instructions (Addendum)
You were admitted to [facility] due to shortness of breath and low oxygen levels. You needed intubation to help you breath. This was likely due to COPD (chronic obstructive pulmonary disease). It is very important that you stop smoking. This will help your lungs get better.   Please continue taking your antibiotic Augmentin for the next 2 days.   Continue taking prednisone taper 10 mg x 3 days (11/13-11/15), then 5 mg x 1 week (11/16-11/22), then 2.5 mg x 2 weeks (11/23-12/6), and then stop.   You will be going to inpatient rehabilitation to keep working on your strength and endurance.

## 2017-09-28 NOTE — Progress Notes (Signed)
PT was decannulated to RA  Sats are stable. Stoma was dressed with gauze  RT to monitor

## 2017-09-28 NOTE — Progress Notes (Signed)
Pt is awake at this time no resp distress or complications noted with trach. Pt is stable able to phonate w/o complications. No problems with capped trach

## 2017-09-28 NOTE — Progress Notes (Signed)
Inpatient Rehabilitation  I have received medical clearance to admit patient to IP Rehab and have a bed to offer today.  Plan to proceed with admission today.  Updated team.  Call if questions.   Carmelia Roller., CCC/SLP Admission Coordinator  [place]  Cell [phone]

## 2017-09-28 NOTE — Progress Notes (Signed)
To IP Rehab 02 per bed Pleasant and cooperative No respitory distress Has cell phone and glasses with her as well as clothing Oriented to IP Rehab. Alert and cooperative

## 2017-09-28 NOTE — H&P (Signed)
Physical Medicine and Rehabilitation Admission H&P    Chief Complaint  Patient presents with  . Debility    HPI:  Caitlin [name] is a 51 y.o. 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 History taken from chart review and patient. She was stared on bronchodilators and steroids but declined on 10/25 with increased WOB requiring intubation. She had difficulty with vent wean due to agitation and ultimately required tracheostomy prior to being weaned to ATC. Hospital course significant for Hemophilus tracheobronchitis/PNA treated with Augmentin, hypotension, delirium and made NPO due aspiration risk with signs of decreased airway protection.  Trach downsized to CFS #6 yesterday and she was  decannulated today. Prednisone being tapered off. MBS done 11/12 revealing normal swallow and she was started on regular diet --no follow up needed.     Patient with admission 8/30- 07/20/17 when she was diagnosed with CM and she has been living with sister. Therapy ongoing and CIR recommended due to debility.    Review of Systems  Constitutional: Negative for chills and fever.  HENT: Negative for hearing loss and tinnitus.   Eyes: Negative for blurred vision and double vision.  Respiratory: Negative for cough, shortness of breath and wheezing.   Gastrointestinal: Negative for constipation, heartburn and nausea.  Genitourinary: Negative for dysuria and urgency.  Musculoskeletal: Negative for back pain, myalgias and neck pain.  Skin: Negative for itching and rash.  Neurological: Negative for speech change, focal weakness and headaches.  Psychiatric/Behavioral: The patient is not nervous/anxious and does not have insomnia.   All other systems reviewed and are negative.     Past Medical History:  Diagnosis Date  . CHF (congestive heart failure) ([place])   . COPD (chronic obstructive pulmonary disease) ([place])   . NSTEMI (non-ST  elevated myocardial infarction) [facility])     History reviewed. No pertinent surgical history.    Family History  Problem Relation Age of Onset  . Heart disease Mother        age 79's  . Hypertension Mother   . Aneurysm Father        brain    Social History:  Single. 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 She  reports that she was smoking 1 PPD. She has been smoking use included cigarettes. She smoked 1.00 pack per day. she has never used smokeless tobacco. She reports that she drinks alcohol. She reports that she uses Marijuana--a blunt/day   Allergies: No Known Allergies    Medications Prior to Admission  Medication Sig Dispense Refill  . albuterol (PROVENTIL HFA;VENTOLIN HFA) 108 (90 Base) MCG/ACT inhaler Inhale 2 puffs into the lungs every 4 (four) hours as needed for wheezing or shortness of breath. 1 Inhaler 0  . albuterol (PROVENTIL) (2.5 MG/3ML) 0.083% nebulizer solution Take 3 mLs (2.5 mg total) by nebulization every 6 (six) hours as needed for wheezing or shortness of breath. 75 mL 12  . furosemide (LASIX) 20 MG tablet Take 1 tablet (20 mg total) by mouth daily. 30 tablet 0  . ipratropium (ATROVENT) 0.02 % nebulizer solution Take 2.5 mLs (0.5 mg total) by nebulization 4 (four) times daily. 75 mL 12  . lisinopril (PRINIVIL,ZESTRIL) 10 MG tablet Take 1 tablet (10 mg total) by mouth daily. 30 tablet 0  . metoprolol succinate (TOPROL-XL) 25 MG 24 hr tablet Take 1 tablet (25 mg total) by mouth daily. 30 tablet 0  .  predniSONE (DELTASONE) 20 MG tablet Take 2 tablets (40 mg total) by mouth daily. 8 tablet 0  . vitamin B-12 (CYANOCOBALAMIN) 100 MCG tablet Take 100 mcg by mouth daily.    Marland Kitchen doxycycline (VIBRAMYCIN) 100 MG capsule Take 1 capsule (100 mg total) by mouth 2 (two) times daily. (Patient not taking: Reported on 09/07/2017) 20 capsule 0  . predniSONE (DELTASONE) 20 MG tablet 2 tabs po daily x 4 days (Patient not taking: Reported on 09/08/2017) 8 tablet 0    Drug  Regimen Review  Drug regimen was reviewed and remains appropriate with no significant issues identified  Home: Home Living Family/patient expects to be discharged to:: Inpatient rehab Living Arrangements: Other relatives Available Help at Discharge: Family, Available PRN/intermittently   Functional History: Prior Function Level of Independence: Independent Comments: completely independent with all ADLs and IADLs prior to admission  Functional Status:  Mobility: Bed Mobility Overal bed mobility: Needs Assistance Bed Mobility: Supine to Sit Supine to sit: Supervision Sit to supine: Supervision General bed mobility comments: supervision, increased time Transfers Overall transfer level: Needs assistance Equipment used: None Transfers: Sit to/from Stand, Stand Pivot Transfers Sit to Stand: Min assist Stand pivot transfers: Min assist General transfer comment: performed transfers with and without RW, min A Ambulation/Gait Ambulation/Gait assistance: Min assist Ambulation Distance (Feet): 100 Feet Assistive device: None General Gait Details: min A gait without RW x 100', occasional mod A to correct LOB, mild ataxia/scissoring.  gait then performed with RW with pt stating she feels more comfortable, min A for turning and taking steps backward    ADL: ADL Overall ADL's : Needs assistance/impaired Eating/Feeding: NPO Eating/Feeding Details (indicate cue type and reason): NG tube Grooming: Minimal assistance, Sitting Upper Body Bathing: Moderate assistance, Sitting Lower Body Bathing: Moderate assistance Upper Body Dressing : Minimal assistance, Sitting Lower Body Dressing: Minimal assistance, Sit to/from stand Toilet Transfer: Minimal assistance, Stand-pivot, RW Toileting- Clothing Manipulation and Hygiene: Minimal assistance Tub/ Shower Transfer: Minimal assistance, Stand-pivot, 3 in 1, Rolling walker General ADL Comments: Pt completed bed mobility and SPT EOB>recliner to  simulate toilet transfer.   Cognition: Cognition Overall Cognitive Status: Within Functional Limits for tasks assessed Orientation Level: Oriented X4 Cognition Arousal/Alertness: Awake/alert Behavior During Therapy: WFL for tasks assessed/performed Overall Cognitive Status: Within Functional Limits for tasks assessed    Blood pressure 91/61, pulse 96, temperature 98.9 F (37.2 C), temperature source Oral, resp. rate 20, height '5\' 9"'$  (1.753 m), weight 54.6 kg (120 lb 4.8 oz), last menstrual period 09/09/2014, SpO2 100 %. Physical Exam  Nursing note and vitals reviewed. Constitutional: She is oriented to person, place, and time. She appears well-developed and well-nourished. No distress.  HENT:  Head: Normocephalic and atraumatic.  Mouth/Throat: Oropharynx is clear and moist.  Eyes: Conjunctivae and EOM are normal. Pupils are equal, round, and reactive to light. Right eye exhibits no discharge. Left eye exhibits no discharge.  Neck: Normal range of motion. Neck supple. No thyromegaly present.  Dry dressing on neck--air loss heard when conversing.   Cardiovascular: Normal rate and regular rhythm.  No murmur heard. Respiratory: Effort normal and breath sounds normal. No stridor. No respiratory distress. She has no wheezes.  GI: Soft. Bowel sounds are normal. She exhibits no distension. There is no tenderness.  Musculoskeletal: She exhibits no edema or tenderness.  Neurological: She is alert and oriented to person, place, and time.  Motor: 4+/5 grossly throughout Sensation intact to light touch  Skin: Skin is warm and dry. She is not diaphoretic.  Dressing to right neck and throat c/d/i  Psychiatric: She has a normal mood and affect. Her behavior is normal. Judgment and thought content normal.    Results for orders placed or performed during the hospital encounter of 09/08/17 (from the past 48 hour(s))  Glucose, capillary     Status: Abnormal   Collection Time: 09/26/17 11:55 AM    Result Value Ref Range   Glucose-Capillary 137 (H) 65 - 99 mg/dL  Glucose, capillary     Status: None   Collection Time: 09/26/17  4:51 PM  Result Value Ref Range   Glucose-Capillary 98 65 - 99 mg/dL  Glucose, capillary     Status: None   Collection Time: 09/26/17  8:56 PM  Result Value Ref Range   Glucose-Capillary 94 65 - 99 mg/dL  Glucose, capillary     Status: Abnormal   Collection Time: 09/27/17 12:45 AM  Result Value Ref Range   Glucose-Capillary 101 (H) 65 - 99 mg/dL  Glucose, capillary     Status: Abnormal   Collection Time: 09/27/17  4:00 AM  Result Value Ref Range   Glucose-Capillary 104 (H) 65 - 99 mg/dL  Basic metabolic panel     Status: Abnormal   Collection Time: 09/27/17  4:42 AM  Result Value Ref Range   Sodium 138 135 - 145 mmol/L   Potassium 3.3 (L) 3.5 - 5.1 mmol/L   Chloride 106 101 - 111 mmol/L   CO2 24 22 - 32 mmol/L   Glucose, Bld 109 (H) 65 - 99 mg/dL   BUN 29 (H) 6 - 20 mg/dL   Creatinine, Ser 0.53 0.44 - 1.00 mg/dL   Calcium 8.7 (L) 8.9 - 10.3 mg/dL   GFR calc non Af Amer >60 >60 mL/min   GFR calc Af Amer >60 >60 mL/min    Comment: (NOTE) The eGFR has been calculated using the CKD EPI equation. This calculation has not been validated in all clinical situations. eGFR's persistently <60 mL/min signify possible Chronic Kidney Disease.    Anion gap 8 5 - 15  Glucose, capillary     Status: Abnormal   Collection Time: 09/27/17  7:44 AM  Result Value Ref Range   Glucose-Capillary 100 (H) 65 - 99 mg/dL  Glucose, capillary     Status: Abnormal   Collection Time: 09/27/17 12:22 PM  Result Value Ref Range   Glucose-Capillary 101 (H) 65 - 99 mg/dL  Glucose, capillary     Status: None   Collection Time: 09/27/17  5:02 PM  Result Value Ref Range   Glucose-Capillary 89 65 - 99 mg/dL  Glucose, capillary     Status: Abnormal   Collection Time: 09/27/17  8:19 PM  Result Value Ref Range   Glucose-Capillary 108 (H) 65 - 99 mg/dL  Basic metabolic panel      Status: Abnormal   Collection Time: 09/28/17  4:32 AM  Result Value Ref Range   Sodium 136 135 - 145 mmol/L   Potassium 3.8 3.5 - 5.1 mmol/L   Chloride 108 101 - 111 mmol/L   CO2 24 22 - 32 mmol/L   Glucose, Bld 98 65 - 99 mg/dL   BUN 20 6 - 20 mg/dL   Creatinine, Ser 0.60 0.44 - 1.00 mg/dL   Calcium 8.8 (L) 8.9 - 10.3 mg/dL   GFR calc non Af Amer >60 >60 mL/min   GFR calc Af Amer >60 >60 mL/min    Comment: (NOTE) The eGFR has been calculated using the CKD EPI equation.  This calculation has not been validated in all clinical situations. eGFR's persistently <60 mL/min signify possible Chronic Kidney Disease.    Anion gap 4 (L) 5 - 15  Glucose, capillary     Status: None   Collection Time: 09/28/17  7:54 AM  Result Value Ref Range   Glucose-Capillary 95 65 - 99 mg/dL   No results found.     Medical Problem List and Plan: 1.  Generalized weakness and poor activity tolerance secondary to debility. 2.  DVT Prophylaxis/Anticoagulation: Pharmaceutical: Lovenox 3. Pain Management: N/A 4. Mood: LCSW to follow for evaluation and support.  5. Neuropsych: This patient is capable of making decisions on her own behalf. 6. Skin/Wound Care: routine pressure relief measures.  7. Fluids/Electrolytes/Nutrition:  Monitor I/O--intake has been good. Check lytes in am.  9. AECOPD: continue Pulmicort bid a Brovana bid. Encourage IS.  Steroids to be tapered off next 48 hours.  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 Continue to hold lasix, lisinopril and metoprolol due to hypotension.  11. Hemophilus PNA: To continue augmentin to complete 14 day course antibiotic regimen --end date 11/14. Continue robitussin tid for now.  12. Steroid induced hyperglycemia: resolving with taper  Post Admission Physician Evaluation: 1. Preadmission assessment reviewed and changes made below. 2. Functional deficits secondary  to debility. 3. Patient is admitted to receive collaborative,  interdisciplinary care between the physiatrist, rehab nursing staff, and therapy team. 4. Patient's level of medical complexity and substantial therapy needs in context of that medical necessity cannot be provided at a lesser intensity of care such as a SNF. 5. Patient has experienced substantial functional loss from his/her baseline which was documented above under the "Functional History" and "Functional Status" headings.  Judging by the patient's diagnosis, physical exam, and functional history, the patient has potential for functional progress which will result in measurable gains while on inpatient rehab.  These gains will be of substantial and practical use upon discharge  in facilitating mobility and self-care at the household level. 28. Physiatrist will provide 24 hour management of medical needs as well as oversight of the therapy plan/treatment and provide guidance as appropriate regarding the interaction of the two. 7. 24 hour rehab nursing will assist with safety, pain management and patient education  and help integrate therapy concepts, techniques,education, etc. 8. PT will assess and treat for/with: Lower extremity strength, range of motion, stamina, balance, functional mobility, safety, adaptive techniques and equipment, woundcare, coping skills, pain control, education.   Goals are: Mod I. 9. OT will assess and treat for/with: ADL's, functional mobility, safety, upper extremity strength, adaptive techniques and equipment, wound mgt, ego support, and community reintegration.   Goals are: Mod I. Therapy may not proceed with showering this patient. 10. Case Management and Social Worker will assess and treat for psychological issues and discharge planning. 11. Team conference will be held weekly to assess progress toward goals and to determine barriers to discharge. 12. Patient will receive at least 3 hours of therapy per day at least 5 days per week. 13. ELOS: 4-7 days.       14. Prognosis:   excellent  Delice Lesch, MD, ABPMR [name], [place] 09/28/2017

## 2017-09-28 NOTE — Progress Notes (Signed)
  Patient was discharged to CIR by MD order; report was given to the nurse who is going to receive the patient. All belongings transported to the new room 4W02.

## 2017-09-28 NOTE — Progress Notes (Signed)
   Subjective:  No acute events overnight. Reports feeling well this AM and has no new complaints. Trach collar capped and tolerating it well. Excited to start rehabilitation and very motivated to get better.   Objective:  Vital signs in last 24 hours: Vitals:   09/27/17 1650 09/27/17 2036 09/27/17 2147 09/28/17 0602  BP:   110/77 91/61  Pulse: (!) 104  (!) 101 87  Resp: 20  18 20   Temp:   98.7 F (37.1 C) 98.9 F (37.2 C)  TempSrc:   Oral Oral  SpO2: 100% 99% 98% 98%  Weight:    120 lb 4.8 oz (54.6 kg)  Height:       General: Very pleasant female, chronically ill-appearing, thin, well-developed, sitting up in bed watching TV  in no acute distress Cardiac: regular rate and rhythm, nl S1/S2, no murmurs, rubs or gallops  Pulm: CTAB.  Currently on capped trach collar and tolerating it well. No increased work of breathing noted while on room air.  Abd: soft, NTND, bowel sounds present Neuro: A&Ox3, able to move all 4 extremities Ext: warm and well perfused, no peripheral edema    Assessment/Plan:  51 year old female with PMH of suspected COPD, stress induced cardiomyopathy, and tobacco use admitted for acute on chronic hypercarbic respiratory failure with hypoxia and encephalopathy.  # Acute on Chronic Hypercarbic/Hypoxic Respiratory Failure s/p Tracheostomy: Thought secondary to suspected COPD from chronic tobacco use as well as Haemophilus influenza tracheobronchitis/PNA. On Augmentin and on a slow Prednisone taper.  On trach collar and and oxygenating well on room air. Per critical care note, decannulation if able to cap trach for 48 hrs. Discharge pending CIR placement.  - NGT Augmentin (11/7-11/11)-->PO Augmentin (11/12-11/14)  - Continue Prednisone taper10 mg x 3 days (11/13-11/15), then 5 mg x 1 week (11/16-11/22), then 2.5 mg x 2 weeks (11/23-12/6), and then d/c  - Continue Brovana, Duonebs, Pulmicort, prn Albuterol - PCCM following for Trach management, possible decannulation  on 11/15 if continues to tolerate capped trach  - IV Toradol q8h PRN added for pain  - CIR consult, d/c today   # Cardiomyopathy: Admitted 06/2017 for suspected stress-induced cardiomyopathy. Repeat TTE this admission shows improved EF of 55-60% (45% prior) with normal wall motion. She appears euvolemic on exam although she did appear euvolemic on prior admission as well. - Strict I/Os, Daily weights - Holding home Toprol-XL and Lasix with intermittent hypotension    # HTN: Currently normotensive now.  - Holding home Lisinopril, Toprol-XL, and Lasix for now.  F: none E: Continue to monitor and replete as needed N: Regular diet   VTE ppx: SQ Lovenox   Code status: Full code   Dispo: Discharge pending CIR placement, likely today .   [name], MD 09/28/2017, 6:59 AM Pager: [phone]

## 2017-09-29 ENCOUNTER — Inpatient Hospital Stay (HOSPITAL_COMMUNITY): Payer: Self-pay | Admitting: Occupational Therapy

## 2017-09-29 ENCOUNTER — Inpatient Hospital Stay (HOSPITAL_COMMUNITY): Payer: Self-pay | Admitting: Speech Pathology

## 2017-09-29 ENCOUNTER — Inpatient Hospital Stay (HOSPITAL_COMMUNITY): Payer: Self-pay | Admitting: Physical Therapy

## 2017-09-29 ENCOUNTER — Inpatient Hospital Stay (HOSPITAL_COMMUNITY): Payer: Self-pay

## 2017-09-29 DIAGNOSIS — R739 Hyperglycemia, unspecified: Secondary | ICD-10-CM

## 2017-09-29 DIAGNOSIS — E8809 Other disorders of plasma-protein metabolism, not elsewhere classified: Secondary | ICD-10-CM

## 2017-09-29 DIAGNOSIS — J441 Chronic obstructive pulmonary disease with (acute) exacerbation: Secondary | ICD-10-CM

## 2017-09-29 DIAGNOSIS — D62 Acute posthemorrhagic anemia: Secondary | ICD-10-CM

## 2017-09-29 DIAGNOSIS — E46 Unspecified protein-calorie malnutrition: Secondary | ICD-10-CM

## 2017-09-29 DIAGNOSIS — T380X5A Adverse effect of glucocorticoids and synthetic analogues, initial encounter: Secondary | ICD-10-CM

## 2017-09-29 DIAGNOSIS — R5381 Other malaise: Principal | ICD-10-CM

## 2017-09-29 LAB — CBC WITH DIFFERENTIAL/PLATELET
BASOS ABS: 0 10*3/uL (ref 0.0–0.1)
BASOS PCT: 0 %
EOS ABS: 0.3 10*3/uL (ref 0.0–0.7)
EOS PCT: 4 %
HCT: 34.3 % — ABNORMAL LOW (ref 36.0–46.0)
Hemoglobin: 11.2 g/dL — ABNORMAL LOW (ref 12.0–15.0)
LYMPHS ABS: 3.8 10*3/uL (ref 0.7–4.0)
Lymphocytes Relative: 49 %
MCH: 28.5 pg (ref 26.0–34.0)
MCHC: 32.7 g/dL (ref 30.0–36.0)
MCV: 87.3 fL (ref 78.0–100.0)
Monocytes Absolute: 0.5 10*3/uL (ref 0.1–1.0)
Monocytes Relative: 6 %
Neutro Abs: 3.2 10*3/uL (ref 1.7–7.7)
Neutrophils Relative %: 41 %
PLATELETS: 416 10*3/uL — AB (ref 150–400)
RBC: 3.93 MIL/uL (ref 3.87–5.11)
RDW: 14.2 % (ref 11.5–15.5)
WBC: 7.8 10*3/uL (ref 4.0–10.5)

## 2017-09-29 LAB — COMPREHENSIVE METABOLIC PANEL
ALBUMIN: 2.8 g/dL — AB (ref 3.5–5.0)
ALT: 41 U/L (ref 14–54)
ANION GAP: 6 (ref 5–15)
AST: 19 U/L (ref 15–41)
Alkaline Phosphatase: 34 U/L — ABNORMAL LOW (ref 38–126)
BUN: 18 mg/dL (ref 6–20)
CALCIUM: 9.1 mg/dL (ref 8.9–10.3)
CHLORIDE: 106 mmol/L (ref 101–111)
CO2: 24 mmol/L (ref 22–32)
CREATININE: 0.59 mg/dL (ref 0.44–1.00)
GFR calc non Af Amer: >60 mL/min (ref >60)
GLUCOSE: 98 mg/dL (ref 65–99)
Potassium: 3.7 mmol/L (ref 3.5–5.1)
SODIUM: 136 mmol/L (ref 135–145)
Total Bilirubin: 0.5 mg/dL (ref 0.3–1.2)
Total Protein: 6 g/dL — ABNORMAL LOW (ref 6.5–8.1)

## 2017-09-29 MED ORDER — PRO-STAT SUGAR FREE PO LIQD
30.0000 mL | Freq: Two times a day (BID) | ORAL | Status: DC
  Administered 2017-09-29 – 2017-10-04 (×11): 30 mL via ORAL
  Filled 2017-09-29 (×10): qty 30

## 2017-09-29 MED ORDER — PREDNISONE 5 MG PO TABS: 2.5000 mg | ORAL_TABLET | Freq: Every day | ORAL | Status: DC

## 2017-09-29 MED ORDER — PREDNISONE 5 MG PO TABS
5.0000 mg | ORAL_TABLET | Freq: Every day | ORAL | Status: DC
  Administered 2017-10-01 – 2017-10-04 (×4): 5 mg via ORAL
  Filled 2017-09-29 (×4): qty 1

## 2017-09-29 MED ORDER — PREMIER PROTEIN SHAKE
2.0000 [oz_av] | Freq: Three times a day (TID) | ORAL | Status: DC
  Administered 2017-09-29 – 2017-10-03 (×9): 2 [oz_av] via ORAL
  Filled 2017-09-29 (×21): qty 325.31

## 2017-09-29 MED ORDER — WHITE PETROLATUM EX OINT
TOPICAL_OINTMENT | CUTANEOUS | Status: AC
  Filled 2017-09-29: qty 28.35

## 2017-09-29 NOTE — Evaluation (Signed)
Occupational Therapy Assessment and Plan  Patient Details  Name: Caitlin [name] MRN: [id number] Date of Birth: 1966-06-20  OT Diagnosis: ataxia and muscle weakness (generalized)and coordination disorder Rehab Potential: Rehab Potential (ACUTE ONLY): Good ELOS: 5-7 days   Today's Date: 09/29/2017 OT Individual Time: 0700-0800 OT Individual Time Calculation (min): 60 min     Problem List:  Patient Active Problem List   Diagnosis Date Noted  . Hypoalbuminemia due to protein-calorie malnutrition ([place])   . Acute blood loss anemia   . Physical debility 09/28/2017  . Hypoxemia   . Takatsuki syndrome   . Pneumonia due to Haemophilus influenzae ([place])   . Tracheostomy status ([place])   . Delirium   . Chronic combined systolic and diastolic congestive heart failure ([place])   . Tobacco abuse   . Steroid-induced hyperglycemia   . Hypokalemia   . Leukocytosis   . SVT (supraventricular tachycardia) ([place])   . Acute respiratory failure with hypoxia ([place])   . Hypotension   . At risk for stress ulcer 09/12/2017  . AKI (acute kidney injury) ([place]) 09/11/2017  . Encounter for care related to feeding tube 09/11/2017  . No contraindication to deep vein thrombosis (DVT) prophylaxis 09/11/2017  . Shortness of breath 09/09/2017  . Acute on chronic respiratory failure ([place]) 09/08/2017  . COPD exacerbation ([place]) 07/15/2017  . Tobacco use 07/15/2017  . Acute combined systolic and diastolic heart failure ([facility])     Past Medical History:  Past Medical History:  Diagnosis Date  . CHF (congestive heart failure) ([place])   . COPD (chronic obstructive pulmonary disease) ([place])   . NSTEMI (non-ST elevated myocardial infarction) [facility])    Past Surgical History: History reviewed. No pertinent surgical history.  Assessment & Plan Clinical Impression: Patient is a 51 y.o. 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 She had difficulty with vent wean due to agitation and ultimately required tracheostomy prior to being weaned to ATC. Hospital course significant for Hemophilus tracheobronchitis/PNA treated with Augmentin, hypotension, delirium andmadeNPO due aspiration risk with signs of decreased airway protection.  Trach downsized to CFS #6 yesterday and she was  decannulated today. Prednisone being tapered off. MBS done 11/12 revealing normal swallow and she was started on regular diet --no follow up needed.     Patient with admission 8/30- 07/20/17 when she was diagnosed with CM and she has been living with sister. Therapy ongoing and CIR recommended due to debility. Patient transferred to CIR on 09/28/2017 .    Patient currently requires min with basic self-care skills secondary to muscle weakness, decreased cardiorespiratoy endurance and decreased sitting balance, decreased standing balance and decreased balance strategies.  Prior to hospitalization, patient could complete ADLs and IADLs with independent .  Patient will benefit from skilled intervention to increase independence with basic self-care skills prior to discharge home with care partner.  Anticipate patient will require intermittent supervision and follow up home health.  OT - End of Session Activity Tolerance: Decreased this session Endurance Deficit: Yes Endurance Deficit Description: requires seated rest breaks following short duration mobility activity OT Assessment Rehab Potential (ACUTE ONLY): Good OT Barriers to Discharge Comments: none known at this time OT Patient demonstrates impairments in the following area(s): Balance;Endurance;Safety OT Basic ADL's Functional Problem(s): Grooming;Bathing;Dressing;Toileting OT Advanced ADL's Functional Problem(s):  Simple Meal Preparation;Laundry OT Transfers  Functional Problem(s): Toilet;Tub/Shower OT Additional Impairment(s): None OT Plan OT Intensity: Minimum of 1-2 x/day, 45 to 90 minutes OT Frequency: 5 out of 7 days OT Duration/Estimated Length of Stay: 5-7 days OT Treatment/Interventions: Balance/vestibular training;Neuromuscular re-education;Self Care/advanced ADL retraining;Therapeutic Exercise;DME/adaptive equipment instruction;UE/LE Strength taining/ROM;Community reintegration;Patient/family education;UE/LE Coordination activities;Discharge planning;Functional mobility training;Psychosocial support;Therapeutic Activities OT Self Feeding Anticipated Outcome(s): n/a OT Basic Self-Care Anticipated Outcome(s): mod I  OT Toileting Anticipated Outcome(s): mod I  OT Bathroom Transfers Anticipated Outcome(s): mod I toileting and supervision for showers OT Recommendation Recommendations for Other Services: Neuropsych consult Patient destination: Home Follow Up Recommendations: Home health OT Equipment Recommended: To be determined   Skilled Therapeutic Intervention Upon entering the room, pt supine in bed awaiting therapist arrival. Pt with no c/o pain this session and agreeable to OT intervention. OT educated pt on OT purpose, POC, and goals with pt verbalizing understanding. Pt performed functional transfer with overall steady assistance without use of AD. Pt donning UB clothing with set up A and required steady assistance for LB clothing management. Pt performed grooming tasks while seated in wheelchair at sink with supervision. Pt with ataxic gait requiring steady assistance with balance. Pt returned to room at end of session and remained seated in wheelchair. Call bell and all needed items within reach upon exiting the room.   OT Evaluation Precautions/Restrictions  Precautions Precautions: Fall Restrictions Weight Bearing Restrictions: No General PT Missed Treatment Reason: Other (Comment)(insurance paperwork) Vital Signs Therapy  Vitals Temp: 98.8 F (37.1 C) Temp Source: Oral Pulse Rate: (!) 112 Resp: 18 BP: 102/63 Patient Position (if appropriate): Sitting Oxygen Therapy SpO2: 100 % O2 Device: Not Delivered Pain Pain Assessment Pain Assessment: No/denies pain Home Living/Prior Functioning Home Living Family/patient expects to be discharged to:: Private residence Living Arrangements: Other relatives Available Help at Discharge: Family, Available PRN/intermittently Type of Home: House Home Access: Stairs to enter [employer] of Steps: 8 steps Entrance Stairs-Rails: Can reach both, Left, Right Home Layout: Multi-level, Able to live on main level with bedroom/bathroom Bathroom Shower/Tub: Chiropodist: Standard Bathroom Accessibility: Yes  Lives With: Alone Prior Function Level of Independence: Independent with gait, Independent with homemaking with ambulation, Independent with basic ADLs  Able to Take Stairs?: Yes Driving: Yes Vocation: Full time employment Vocation Requirements: CNA Comments: completely independent with all ADLs and IADLs prior to admission ADL   Vision Baseline Vision/History: No visual deficits Patient Visual Report: No change from baseline Perception  Perception: Within Functional Limits Praxis Praxis: Intact Cognition Overall Cognitive Status: Within Functional Limits for tasks assessed Arousal/Alertness: Awake/alert Orientation Level: Person;Place;Situation Person: Oriented Place: Oriented Situation: Oriented Year: 2018 Month: November Day of Week: Correct Memory: Appears intact Immediate Memory Recall: Sock;Blue;Bed Memory Recall: Sock;Blue;Bed Memory Recall Sock: Without Cue Memory Recall Blue: Without Cue Memory Recall Bed: Without Cue Attention: Sustained Sustained Attention: Appears intact Awareness: Appears intact Problem Solving: Appears intact Safety/Judgment: Appears intact Sensation Sensation Light Touch: Appears  Intact Stereognosis: Not tested Hot/Cold: Not tested Proprioception: Appears Intact Coordination Gross Motor Movements are Fluid and Coordinated: Yes Heel Shin Test: WFL BLE Motor  Motor Motor: Ataxia;Other (comment) Motor - Skilled Clinical Observations: mild ataxia/scissoring gait pattern with distractions, generalized weakness Mobility  Bed Mobility Bed Mobility: Sit to Supine;Supine to Sit Supine to Sit: 6: Modified independent (Device/Increase time) Sit to Supine: 6: Modified independent (Device/Increase time)  Trunk/Postural Assessment  Cervical Assessment Cervical Assessment: Within Functional Limits Thoracic Assessment Thoracic Assessment: Within Functional Limits Lumbar Assessment  Lumbar Assessment: Within Functional Limits Postural Control Postural Control: Within Functional Limits  Balance Balance Balance Assessed: Yes Static Sitting Balance Static Sitting - Balance Support: No upper extremity supported Static Sitting - Level of Assistance: 6: Modified independent (Device/Increase time) Dynamic Sitting Balance Dynamic Sitting - Balance Support: No upper extremity supported Dynamic Sitting - Level of Assistance: 5: Stand by assistance Dynamic Sitting - Balance Activities: Lateral lean/weight shifting;Forward lean/weight shifting Static Standing Balance Static Standing - Balance Support: No upper extremity supported Static Standing - Level of Assistance: 5: Stand by assistance Dynamic Standing Balance Dynamic Standing - Balance Support: No upper extremity supported;During functional activity Dynamic Standing - Level of Assistance: 5: Stand by assistance;4: Min assist Extremity/Trunk Assessment RUE Assessment RUE Assessment: Within Functional Limits LUE Assessment LUE Assessment: Within Functional Limits   See Function Navigator for Current Functional Status.   Refer to Care Plan for Long Term Goals  Recommendations for other services: Neuropsych and  Therapeutic Recreation  Pet therapy and Kitchen group   Discharge Criteria: Patient will be discharged from OT if patient refuses treatment 3 consecutive times without medical reason, if treatment goals not met, if there is a change in medical status, if patient makes no progress towards goals or if patient is discharged from hospital.  The above assessment, treatment plan, treatment alternatives and goals were discussed and mutually agreed upon: by patient  Gypsy Decant 09/29/2017, 5:21 PM

## 2017-09-29 NOTE — Progress Notes (Signed)
Patient information reviewed and entered into eRehab system by Makaylynn Bonillas, RN, CRRN, PPS Coordinator.  Information including medical coding and functional independence measure will be reviewed and updated through discharge.    

## 2017-09-29 NOTE — Progress Notes (Signed)
Physical Medicine and Rehabilitation Consult  Reason for Consult: Debility with encephalopathy Referring Physician: Dr. Lynnae January.    HPI: Caitlin [name] is a 51 y.o. 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 History taken from chart review and patient. She was stared on bronchodilators and steroids but declined on 10/25 with increased WOB requiring intubation. She had difficulty with vent wean due to agitation and ultimately required tracheostomy prior to being weaned to ATC. Hospital course significant for Hemophilus tracheobronchitis/PNA treated with Augmentin, hypotension, delirium and made NPO due aspiration risk with signs of decreased airway protection.  To start capping of trach today with possible decannulation in 48 hours if able to tolerate capping. Prednisone being tapered off. MBS done today and to start regular diet with PMSV in place.  She is tolerating PMSV trials and noted to be severely deconditioned on PT evaluation yesterday.   Patient with admission 8/30- 07/20/17 when she was diagnosed with CM and she has been living with sister to help regain strength due to debility from recent medical issues. She was able to ambulate without AD and she was getting back to doing household chores.   CIR recommended due to decline in functional status.     Review of Systems  HENT: Negative for hearing loss and tinnitus.   Eyes: Negative for blurred vision and double vision.  Respiratory: Positive for cough. Negative for shortness of breath (respiratoyr status improving).   Cardiovascular: Negative for chest pain and palpitations.  Gastrointestinal: Negative for abdominal pain, heartburn and nausea.  Genitourinary: Negative for dysuria and urgency.  Musculoskeletal: Negative for back pain, joint pain and myalgias.  Skin: Negative for rash.  Neurological: Positive for weakness. Negative for dizziness, sensory  change, focal weakness and headaches.  Psychiatric/Behavioral: The patient is not nervous/anxious and does not have insomnia.   All other systems reviewed and are negative.         Past Medical History:  Diagnosis Date  . CHF (congestive heart failure) ([place])   . COPD (chronic obstructive pulmonary disease) ([place])   . NSTEMI (non-ST elevated myocardial infarction) [facility])    History reviewed. No pertinent surgical history.         Family History  Problem Relation Age of Onset  . Heart disease Mother        age 78's  . Hypertension Mother   . Aneurysm Father        brain    Social History:  Single. 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 She  reports that she was smoking 1 PPD. She has been smoking use included cigarettes. She smoked 1.00 pack per day. she has never used smokeless tobacco. She reports that she drinks alcohol. She reports that she uses drugs. Drug: Marijuana.    Allergies: No Known Allergies          Medications Prior to Admission  Medication Sig Dispense Refill  . albuterol (PROVENTIL HFA;VENTOLIN HFA) 108 (90 Base) MCG/ACT inhaler Inhale 2 puffs into the lungs every 4 (four) hours as needed for wheezing or shortness of breath. 1 Inhaler 0  . albuterol (PROVENTIL) (2.5 MG/3ML) 0.083% nebulizer solution Take 3 mLs (2.5 mg total) by nebulization every 6 (six) hours as needed for wheezing or shortness of breath. 75 mL 12  . furosemide (LASIX) 20 MG tablet Take 1 tablet (20 mg total) by mouth daily. 30 tablet 0  . ipratropium (ATROVENT)  0.02 % nebulizer solution Take 2.5 mLs (0.5 mg total) by nebulization 4 (four) times daily. 75 mL 12  . lisinopril (PRINIVIL,ZESTRIL) 10 MG tablet Take 1 tablet (10 mg total) by mouth daily. 30 tablet 0  . metoprolol succinate (TOPROL-XL) 25 MG 24 hr tablet Take 1 tablet (25 mg total) by mouth daily. 30 tablet 0  . predniSONE (DELTASONE) 20 MG tablet Take 2 tablets (40 mg total) by mouth daily. 8 tablet 0  .  vitamin B-12 (CYANOCOBALAMIN) 100 MCG tablet Take 100 mcg by mouth daily.    [place] Kitchen doxycycline (VIBRAMYCIN) 100 MG capsule Take 1 capsule (100 mg total) by mouth 2 (two) times daily. (Patient not taking: Reported on 09/07/2017) 20 capsule 0  . predniSONE (DELTASONE) 20 MG tablet 2 tabs po daily x 4 days (Patient not taking: Reported on 09/08/2017) 8 tablet 0    Home: Home Living Family/patient expects to be discharged to:: Inpatient rehab Living Arrangements: Other relatives Available Help at Discharge: Family, Available PRN/intermittently  Functional History: Prior Function Level of Independence: Independent Comments: completely independent with all ADLs and IADLs prior to admission Functional Status:  Mobility: Bed Mobility Overal bed mobility: Needs Assistance Bed Mobility: Supine to Sit Supine to sit: Min guard Sit to supine: Min guard General bed mobility comments: Min guard for safety with use of railing and HOB elevated to get into and out of bed.  Transfers Overall transfer level: Needs assistance Equipment used: Rolling walker (2 wheeled) Transfers: Sit to/from Stand, [employer] Transfers Sit to Stand: Min assist Stand pivot transfers: Min assist General transfer comment: EOB>recliner. Decreased speed for age.  Ambulation/Gait General Gait Details: Did not attempt this session  ADL: ADL Overall ADL's : Needs assistance/impaired Eating/Feeding: NPO Eating/Feeding Details (indicate cue type and reason): NG tube Grooming: Minimal assistance, Sitting Upper Body Bathing: Moderate assistance, Sitting Lower Body Bathing: Moderate assistance Upper Body Dressing : Minimal assistance, Sitting Lower Body Dressing: Minimal assistance, Sit to/from stand Toilet Transfer: Minimal assistance, Stand-pivot, RW Toileting- Clothing Manipulation and Hygiene: Minimal assistance Tub/ Shower Transfer: Minimal assistance, Stand-pivot, 3 in 1, Rolling walker General ADL Comments: Pt  completed bed mobility and SPT EOB>recliner to simulate toilet transfer.   Cognition: Cognition Overall Cognitive Status: Within Functional Limits for tasks assessed Orientation Level: Intubated/Tracheostomy - Unable to assess Cognition Arousal/Alertness: Awake/alert Behavior During Therapy: WFL for tasks assessed/performed Overall Cognitive Status: Within Functional Limits for tasks assessed  Blood pressure 114/77, pulse 92, temperature 98.7 F (37.1 C), temperature source Oral, resp. rate 20, height 5\' 9"  (1.753 m), weight 55.8 kg (123 lb 1.6 oz), last menstrual period 09/09/2014, SpO2 100 %. Physical Exam  Constitutional: She is oriented to person, place, and time. She appears well-developed and well-nourished. No distress.  HENT:  Head: Normocephalic and atraumatic.  Eyes: Conjunctivae and EOM are normal. Pupils are equal, round, and reactive to light.  Neck: Normal range of motion.  Trach in place.   Cardiovascular: Normal rate and regular rhythm.  Respiratory: Effort normal. No stridor. No respiratory distress. She has wheezes in the right lower field and the left lower field. She has rhonchi in the left lower field.  GI: Soft. Bowel sounds are normal. She exhibits no distension. There is no tenderness.  Musculoskeletal: She exhibits no edema or tenderness.  Neurological: She is alert and oriented to person, place, and time.  Dysphonia but able to phonate without PMSV.  Able to follow basic commands without difficulty.  Motor: B/l UE 4-/5 proximal to distal B/l  LE: 4+/5 proximal to distal  Skin: Skin is warm and dry. She is not diaphoretic.  Right neck with dressing c/d/i  Psychiatric: She has a normal mood and affect. Her behavior is normal. Judgment and thought content normal.  Assessment/Plan: Diagnosis: Debility Labs independently reviewed.  Records reviewed and summated above.  1. Does the need for close, 24 hr/day medical supervision in concert with the patient's  rehab needs make it unreasonable for this patient to be served in a less intensive setting? Yes  2. 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 Due to safety, skin/wound care, disease management and patient education, does the patient require 24 hr/day rehab nursing? Yes 4. Does the patient require coordinated care of a physician, rehab nurse, PT (1-2 hrs/day, 5 days/week), OT (1-2 hrs/day, 1-2 days/week) and SLP (1-2 hrs/day, 5 days/week) to address physical and functional deficits in the context of the above medical diagnosis(es)? Yes Addressing deficits in the following areas: balance, endurance, locomotion, strength, transferring, bathing, dressing, toileting, speech and psychosocial support 5. Can the patient actively participate in an intensive therapy program of at least 3 hrs of therapy per day at least 5 days per week? Yes 6. The potential for patient to make measurable gains while on inpatient rehab is excellent 7. Anticipated functional outcomes upon discharge from inpatient rehab are modified independent and supervision  with PT, modified independent and supervision with OT, modified independent with SLP. 8. Estimated rehab length of stay to reach the above functional goals is: 5-9 days. 9. Anticipated D/C setting: Home 10. Anticipated post D/C treatments: HH therapy and Home excercise program 11. Overall  Rehab/Functional Prognosis: excellent and good  RECOMMENDATIONS: This patient's condition is appropriate for continued rehabilitative care in the following setting: Likely CIR soon. Patient has agreed to participate in recommended program. Yes Note that insurance prior authorization may be required for reimbursement for recommended care.  Comment: Rehab Admissions Coordinator to follow up.  Delice Lesch, MD, ABPMR [name], [place] 09/27/2017          Revision History                        Routing History

## 2017-09-29 NOTE — Progress Notes (Signed)
Occupational Therapy Note  Patient Details  Name: Caitlin [name] MRN: [id number] Date of Birth: July 24, 1966  Today's Date: 09/29/2017 OT Individual Time: 9295-7473 OT Individual Time Calculation (min): 25 min   Pt denies pain Individual therapy  PT resting on bed upon arrival.  Focus on discharge planning, review of LTGs, sit<>stand, functional amb without AD in room for simple home mgmt tasks, and funcitonal transfers.  Pt requires occasional steady A with ambulation and reaching outside BOS for simple home mgmt tasks. Pt returned to bed and remained in bed with all needs within reach.    Leotis Shames [facility] 09/29/2017, 2:32 PM

## 2017-09-29 NOTE — Progress Notes (Signed)
[facility] PHYSICAL MEDICINE & REHABILITATION     PROGRESS NOTE  Subjective/Complaints:  Pt seen sitting up in her chair working with OT this AM. She slept well overnight.  She states she feels she is doing well and hopes she does not have to be in rehab for too long.  Discussed with SLP.   ROS: Denies CP, SOB, N/VD.  Objective: Vital Signs: Blood pressure 107/76, pulse 96, temperature 97.9 F (36.6 C), temperature source Oral, resp. rate 18, height 5\' 9"  (1.753 m), weight 55 kg (121 lb 4.1 oz), last menstrual period 09/09/2014, SpO2 100 %. No results found. Recent Labs    09/29/17 0621  WBC 7.8  HGB 11.2*  HCT 34.3*  PLT 416*   Recent Labs    09/28/17 0432 09/29/17 0621  NA 136 136  K 3.8 3.7  CL 108 106  GLUCOSE 98 98  BUN 20 18  CREATININE 0.60 0.59  CALCIUM 8.8* 9.1   CBG (last 3)  Recent Labs    09/27/17 2019 09/28/17 0754 09/28/17 1204  GLUCAP 108* 95 94    Wt Readings from Last 3 Encounters:  09/29/17 55 kg (121 lb 4.1 oz)  09/28/17 54.6 kg (120 lb 4.8 oz)  09/07/17 59 kg (130 lb)    Physical Exam:  BP 107/76 (BP Location: Left Arm)   Pulse 96   Temp 97.9 F (36.6 C) (Oral)   Resp 18   Ht 5\' 9"  (1.753 m)   Wt 55 kg (121 lb 4.1 oz)   LMP 09/09/2014 (Approximate)   SpO2 100%   BMI 17.91 kg/m  Constitutional: She appears well-developed and well-nourished. No distress.  HENT: Normocephalic and atraumatic.  Eyes: EOM are normal. No discharge.  Neck: Dry dressing on neck Cardiovascular: Normal rate and regular rhythm. No JVD. Respiratory: Effort normal and breath sounds normal.  GI: Bowel sounds are normal. She exhibits no distension.  Musculoskeletal: She exhibits no edema or tenderness.  Neurological: She is alert and oriented to person, place, and time.  Motor: 4+/5 grossly throughout Sensation intact to light touch  Skin: Skin is warm and dry. She is not diaphoretic.  Dressing to neck c/d/i  Psychiatric: She has a normal mood and affect.  Her behavior is normal. Judgment and thought content normal.   Assessment/Plan: 1. Functional deficits secondary to debility which require 3+ hours per day of interdisciplinary therapy in a comprehensive inpatient rehab setting. Physiatrist is providing close team supervision and 24 hour management of active medical problems listed below. Physiatrist and rehab team continue to assess barriers to discharge/monitor patient progress toward functional and medical goals.  Function:  Bathing Bathing position      Bathing parts      Bathing assist        Upper Body Dressing/Undressing Upper body dressing                    Upper body assist        Lower Body Dressing/Undressing Lower body dressing                                  Lower body assist        Toileting Toileting          Toileting assist     Transfers Chair/bed transfer             Locomotion Ambulation  Wheelchair          Cognition Comprehension    Expression    Social Interaction    Problem Solving    Memory      Medical Problem List and Plan: 1.  Generalized weakness and poor activity tolerance secondary to debility.   Begin CIR 2.  DVT Prophylaxis/Anticoagulation: Pharmaceutical: Lovenox 3. Pain Management: N/A 4. Mood: LCSW to follow for evaluation and support.  5. Neuropsych: This patient is capable of making decisions on her own behalf. 6. Skin/Wound Care: routine pressure relief measures.  7. Fluids/Electrolytes/Nutrition:  Monitor I/Os   BMP within acceptable range on 11/14 9. AECOPD: continue Pulmicort bid a Brovana bid. Encourage IS.   Steroids to be tapered off 11/16.  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 Continue to hold lasix, lisinopril and metoprolol due to hypotension.  11. Hemophilus PNA: To continue augmentin to complete 14 day course antibiotic regimen --end date today. Continue robitussin tid for now.  12. Steroid  induced hyperglycemia: resolving with taper   Monitor with increased mobility 13. Hypoalbuminemia   Supplement initiated on 11/14 14. ABLA   Hb 11.2 on 11/14   Cont to monitor  LOS (Days) 1 A FACE TO FACE EVALUATION WAS PERFORMED  Leah Thornberry Lorie Phenix 09/29/2017 8:23 AM

## 2017-09-29 NOTE — Progress Notes (Signed)
PMR Admission Coordinator Pre-Admission Assessment  Patient: Caitlin [name] is an 51 y.o., female MRN: [id number] DOB: 1966-06-10 Height: 5\' 9"  (175.3 cm) Weight: 54.6 kg (120 lb 4.8 oz)                                                                                                                                                  Insurance Information HMO:     PPO:      PCP:      IPA:      80/20:      OTHER:  PRIMARY: Uninsured/Self-pay      Policy#:       Subscriber:  CM Name:       Phone#:      Fax#:  Pre-Cert#:       Employer:  Benefits:  Phone #:      Name:  Eff. Date:      Deduct:       Out of Pocket Max:       Life Max:  CIR:       SNF:  Outpatient:      Co-Pay:  Home Health:       Co-Pay:  DME:      Co-Pay:  Providers:   Medicaid Application Date:       Case Manager:  Disability Application Date:       Case Worker:  * patient reports that she has been in contact with a financial counselor   Emergency Contact Information        Contact Information    Name Relation Home Work Mobile   Caitlin [name] Son   [phone]   Caitlin [name] Son   [phone]   Caitlin [name] Sister   [phone]   Caitlin [name] Sister   [phone]     Current Medical History  Patient Admitting Diagnosis: Debility  History of Present Illness: Caitlin [name] a 51 y.o.femalewith history of CHF due to Takotsubo CM, tobacco abuse, COPD; who was admitted on 09/07/17 with SOB due to AECOPD.History taken from chart review and patient.Patient with admission 8/30- 07/20/17 when she was diagnosed with CM and she has been living with sister to help regain strength due to debility fromrecent medical issues.She was able to ambulate without AD and she was getting back to doing household chores. Upon this admission, she was stared on bronchodilators and steroids but declined on 10/25 with increased WOB requiring intubation. She had difficulty with vent wean due to agitation and ultimately  required tracheostomy prior to being weaned to ATC. Hospital course significant for Hemophilus tracheobronchitis/PNA treated with Augmentin, hypotension, delirium andmadeNPO due aspiration risk with signs of decreased airway protection. Trach changed to a Shiley 6 cuffless with capping trials initiated 09/27/17 with possible decannulation in soon if able to tolerate. Prednisone being tapered off. MBS done 09/27/17 and a regular diet with thin liquids initiated  with cap or PMSV in place.She was noted to be severely deconditioned on PT evaluation and CIR recommended due to decline in functional status.Patient admitted to Inpatient Rehabilitation 09/28/17.   Past Medical History      Past Medical History:  Diagnosis Date  . CHF (congestive heart failure) ([place])   . COPD (chronic obstructive pulmonary disease) ([place])   . NSTEMI (non-ST elevated myocardial infarction) ([place])     Family History  family history includes Aneurysm in her father; Heart disease in her mother; Hypertension in her mother.  Prior Rehab/Hospitalizations:  Has the patient had major surgery during 100 days prior to admission? No  Current Medications   Current Facility-Administered Medications:  .  amoxicillin-clavulanate (AUGMENTIN) 400-57 MG/5ML suspension 800 mg, 800 mg, Oral, Q12H, Santos-Sanchez, Idalys, MD, 800 mg at 09/28/17 0946 .  arformoterol (BROVANA) nebulizer solution 15 mcg, 15 mcg, Nebulization, BID, Caitlin Harold, NP, 15 mcg at 09/28/17 0730 .  budesonide (PULMICORT) nebulizer solution 0.5 mg, 0.5 mg, Nebulization, BID, Caitlin Harold, NP, 0.5 mg at 09/28/17 0730 .  chlorhexidine (PERIDEX) 0.12 % solution 15 mL, 15 mL, Mouth Rinse, BID, Caitlin Noel, MD, 15 mL at 09/28/17 0940 .  enoxaparin (LOVENOX) injection 40 mg, 40 mg, Subcutaneous, Q24H, Hoffman, Jessica Ratliff, DO, 40 mg at 09/27/17 1720 .  guaiFENesin (ROBITUSSIN) 100 MG/5ML solution 200 mg, 10 mL, Oral, TID, Santos-Sanchez, Idalys, MD,  200 mg at 09/28/17 0940 .  insulin aspart (novoLOG) injection 0-9 Units, 0-9 Units, Subcutaneous, Q4H, Caitlin Glazier, MD, 1 Units at 09/26/17 1212 .  ipratropium-albuterol (DUONEB) 0.5-2.5 (3) MG/3ML nebulizer solution 3 mL, 3 mL, Nebulization, Q4H PRN, Caitlin Glazier, MD, 3 mL at 09/27/17 1520 .  ketorolac (TORADOL) 15 MG/ML injection 15 mg, 15 mg, Intravenous, Q8H PRN, Caitlin Ochs, MD, 15 mg at 09/26/17 1046 .  MEDLINE mouth rinse, 15 mL, Mouth Rinse, q12n4p, Caitlin Mead V, MD, 15 mL at 09/27/17 1721 .  nicotine (NICODERM CQ - dosed in mg/24 hr) patch 7 mg, 7 mg, Transdermal, Daily, Caitlin Glazier, MD, 7 mg at 09/28/17 0940 .  pantoprazole sodium (PROTONIX) 40 mg/20 mL oral suspension 40 mg, 40 mg, Oral, Daily, Santos-Sanchez, Idalys, MD, 40 mg at 09/28/17 0940 .  phenol (CHLORASEPTIC) mouth spray 1 spray, 1 spray, Mouth/Throat, PRN, Caitlin Person, NP, 1 spray at 09/27/17 1719 .  [START ON 09/29/2017] predniSONE (DELTASONE) tablet 10 mg, 10 mg, Oral, Q breakfast, Santos-Sanchez, Idalys, MD .  thiamine (VITAMIN B-1) tablet 100 mg, 100 mg, Oral, Daily, Santos-Sanchez, Idalys, MD, 100 mg at 09/28/17 0940  Patients Current Diet: Diet regular Room service appropriate? 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  Just prior to this admission she was regaining her strength and about ready to return to her own home.  She is eager to get back there and has the support of her sister and niece until that time.  She wishes to be active and independent again.  Home Assistive Devices / Equipment Home Assistive  Devices/Equipment: None  Prior Device Use: Indicate devices/aids used by the patient prior to current illness, exacerbation or injury? None of the  above  Prior Functional Level Prior Function Level of Independence: Independent Comments: completely independent with all ADLs and IADLs prior to admission  Self Care: Did the patient need help bathing, dressing, using the toilet or eating? Independent  Indoor Mobility: Did the patient need assistance with walking from room to room (with or without device)? Independent  Stairs: Did the patient need assistance with internal or external stairs (with or without device)? Independent  Functional Cognition: Did the patient need help planning regular tasks such as shopping or remembering to take medications? Independent  Current Functional Level Cognition  Overall Cognitive Status: Within Functional Limits for tasks assessed Orientation Level: Oriented X4    Extremity Assessment (includes Sensation/Coordination)  Upper Extremity Assessment: Generalized weakness  Lower Extremity Assessment: Defer to PT evaluation    ADLs  Overall ADL's : Needs assistance/impaired Eating/Feeding: NPO Eating/Feeding Details (indicate cue type and reason): NG tube Grooming: Minimal assistance, Sitting Upper Body Bathing: Moderate assistance, Sitting Lower Body Bathing: Moderate assistance Upper Body Dressing : Minimal assistance, Sitting Lower Body Dressing: Minimal assistance, Sit to/from stand Toilet Transfer: Minimal assistance, Stand-pivot, RW Toileting- Clothing Manipulation and Hygiene: Minimal assistance Tub/ Shower Transfer: Minimal assistance, Stand-pivot, 3 in 1, Rolling walker General ADL Comments: Pt completed bed mobility and SPT EOB>recliner to simulate toilet transfer.     Mobility  Overal bed mobility: Needs Assistance Bed Mobility: Supine to Sit Supine to sit: Supervision Sit to supine: Supervision General bed mobility  comments: supervision, increased time    Transfers  Overall transfer level: Needs assistance Equipment used: None Transfers: Sit to/from Stand, Stand Pivot Transfers Sit to Stand: Min assist Stand pivot transfers: Min assist General transfer comment: performed transfers with and without RW, min A    Ambulation / Gait / Stairs / Wheelchair Mobility  Ambulation/Gait Ambulation/Gait assistance: Museum/gallery curator (Feet): 100 Feet Assistive device: None General Gait Details: min A gait without RW x 100', occasional mod A to correct LOB, mild ataxia/scissoring.  gait then performed with RW with pt stating she feels more comfortable, min A for turning and taking steps backward    Posture / Balance Balance Overall balance assessment: Needs assistance Sitting-balance support: No upper extremity supported, Feet supported Sitting balance-Leahy Scale: Fair Standing balance support: Bilateral upper extremity supported, During functional activity Standing balance-Leahy Scale: Poor Standing balance comment: min A    Special needs/care consideration BiPAP/CPAP: CPAP CPM: No Continuous Drip IV: No Dialysis: No Life Vest: No Oxygen: No, but monitoring oxygen saturation  Special Bed: No Trach Size: Shiley, 6 uncuffed with capping trials in progress Wound Vac (area): No       Skin: Dry, Abrasion to left elbow                               Bowel mgmt: Continent, last BM 09/27/17 Bladder mgmt: Continent  Diabetic mgmt: No     Previous Home Environment Living Arrangements: Other relatives Available Help at Discharge: Family, Available PRN/intermittently Home Care Services: No  Discharge Living Setting Plans for Discharge Living Setting: Lives with (comment)(sister and niece) Type of Home at Discharge: House Discharge Home Layout: Two level, Able to live on main level with bedroom/bathroom Alternate Level Stairs-Number of Steps: 1 flight  Discharge Home Access:  Stairs to enter Entrance Stairs-Rails: Can reach both Entrance Stairs-Number of Steps: 6 Discharge Bathroom Shower/Tub: Tub/shower unit, Curtain(has a bench ) Discharge Bathroom Toilet: Standard Discharge Bathroom Accessibility: Yes How Accessible:  Accessible via walker Does the patient have any problems obtaining your medications?: Yes (Describe)(reports that ex-husband payes for her meds, long term TBD)  Social/Family/Support Systems Patient Roles: Parent, Other (Comment)(Sister) Contact Information: Sister, Caitlin [name] Anticipated Caregiver: Sister and niece when sister is at work  Anticipated Ambulance [name] Information: [phone] Ability/Limitations of Caregiver: Sister works nights  Caregiver Availability: 24/7 Discharge Plan Discussed with Primary Caregiver: (discussed with patient ) Is Caregiver In Agreement with Plan?: (patient in agreement and report's sister is in agreement ) Does Caregiver/Family have Issues with Lodging/Transportation while Pt is in Rehab?: No  Goals/Additional Needs Patient/Family Goal for Rehab: PT/OT Mod I-Supervision; SLP Mod I  Expected length of stay: 5-9 days  Cultural Considerations: None Dietary Needs: Regular textures and thin liquids  Equipment Needs: TBD Special Service Needs: Trach capping initiated with plans to decannulate  Additional Information: None Pt/Family Agrees to Admission and willing to participate: Yes Program Orientation Provided & Reviewed with Pt/Caregiver Including Roles  & Responsibilities: Yes Additional Information Needs: N/A  Barriers to Discharge: Trach  Decrease burden of Care through IP rehab admission: No  Possible need for SNF placement upon discharge: No  Patient Condition: This patient's condition remains as documented in the consult dated 09/27/17 at 1459, in which the Rehabilitation Physician determined and documented that the patient's condition is appropriate for intensive rehabilitative care  in an inpatient rehabilitation facility. Will admit to inpatient rehab today.  Preadmission Screen Completed By:  Gunnar Fusi, 09/28/2017 11:09 AM ______________________________________________________________________   Discussed status with Dr. Posey Pronto on 09/28/17 at 1116 and received telephone approval for admission today.  Admission Coordinator:  Gunnar Fusi, time 1116/Date 09/28/17             Cosigned by: Jamse Arn, MD at 09/28/2017 11:31 AM  Revision History

## 2017-09-29 NOTE — Evaluation (Signed)
Speech Language Pathology Assessment and Plan  Patient Details  Name: Caitlin [name] MRN: [id number] Date of Birth: [birth year]  ST evaluation only   Today's Date: 09/29/2017 SLP Individual Time: 3009-2330 SLP Individual Time Calculation (min): 60 min   Problem List:  Patient Active Problem List   Diagnosis Date Noted  . Hypoalbuminemia due to protein-calorie malnutrition ([place])   . Acute blood loss anemia   . Physical debility 09/28/2017  . Hypoxemia   . Takatsuki syndrome   . Pneumonia due to Haemophilus influenzae ([place])   . Tracheostomy status ([place])   . Delirium   . Chronic combined systolic and diastolic congestive heart failure ([place])   . Tobacco abuse   . Steroid-induced hyperglycemia   . Hypokalemia   . Leukocytosis   . SVT (supraventricular tachycardia) ([place])   . Acute respiratory failure with hypoxia ([place])   . Hypotension   . At risk for stress ulcer 09/12/2017  . AKI (acute kidney injury) ([place]) 09/11/2017  . Encounter for care related to feeding tube 09/11/2017  . No contraindication to deep vein thrombosis (DVT) prophylaxis 09/11/2017  . Shortness of breath 09/09/2017  . Acute on chronic respiratory failure ([place]) 09/08/2017  . COPD exacerbation ([place]) 07/15/2017  . Tobacco use 07/15/2017  . Acute combined systolic and diastolic heart failure ([facility])    Past Medical History:  Past Medical History:  Diagnosis Date  . CHF (congestive heart failure) ([place])   . COPD (chronic obstructive pulmonary disease) ([place])   . NSTEMI (non-ST elevated myocardial infarction) [facility])    Past Surgical History: History reviewed. No pertinent surgical history.  Assessment / Plan / Recommendation Clinical Impression Caitlin [name] a 51 y.o.femalewith history of CHF due to Takotsubo CM, tobacco abuse, COPD; who was admitted on 09/07/17 with SOB due to AECOPD.History taken from chart review and patient.She was stared on bronchodilators and steroids but declined on 10/25 with increased  WOB requiring intubation. She had difficulty with vent wean due to agitation and ultimately required tracheostomy prior to being weaned to ATC. Hospital course significant for Hemophilus tracheobronchitis/PNA treated with Augmentin, hypotension, delirium andmadeNPO due aspiration risk with signs of decreased airway protection. Trach downsized to CFS #6 yesterday and she was decannulated today. Prednisone being tapered off. MBS done 11/12 revealing normal swallow and she was started on regular diet --no follow up needed for dysphagia. Patient with admission 8/30- 07/20/17 when she was diagnosed with CM and she has been living with sister. Therapy ongoing and CIR recommended due to debility. Pt admitted to [facility] on 09/28/17.   Cognitive linguistic evaluation completed on 09/29/17. Pt demonstrated functional cognitive ability as evidenced by score of 27 out of 30 on MOCA version 7.2 (n=>26). Per pt report, she continues ot tolerate regulard diet without overt s/s of aspiration. Further ST services are not indicated at this time. All education completed.    Skilled Therapeutic Interventions          Cognitive linguistic evaluation completed,see above. Furthermore, pt was independent with simple mental math, expressing complex ideas and able to describe safe discharge plan. All education completed.    SLP Assessment  Patient does not need any further [facility] [place] Pathology Services    Recommendations  53 Follow up Recommendations: None Equipment Recommended: None recommended by SLP           Pain    Prior Functioning Cognitive/Linguistic Baseline: Within functional limits Type of Home: House  Lives With: Alone Available Help at Discharge: Family;Available  PRN/intermittently(Sister) Vocation: Full time employment  Function:    Cognition Comprehension Comprehension assist level: Follows complex conversation/direction with no assist  Expression   Expression  assist level: Expresses complex ideas: With no assist  Social Interaction Social Interaction assist level: Interacts appropriately with others - No medications needed.  Problem Solving Problem solving assist level: Solves complex problems: Recognizes & self-corrects  Memory Memory assist level: Complete Independence: No helper   Short Term Goals: No short term goals set  Refer to Care Plan for Long Term Goals  Recommendations for other services: None   Discharge Criteria: Patient will be discharged from SLP if patient refuses treatment 3 consecutive times without medical reason, if treatment goals not met, if there is a change in medical status, if patient makes no progress towards goals or if patient is discharged from hospital.  The above assessment, treatment plan, treatment alternatives and goals were discussed and mutually agreed upon: by patient  Caitlin [name] 09/29/2017, 9:26 AM

## 2017-09-29 NOTE — Evaluation (Signed)
Physical Therapy Assessment and Plan  Patient Details  Name: Caitlin [name] MRN: [id number] Date of Birth: 17-Jan-1966  PT Diagnosis: Abnormality of gait, Ataxic gait, Difficulty walking and Muscle weakness Rehab Potential: Excellent ELOS: 5-7 days   Today's Date: 09/29/2017 PT Individual Time: 1130-1200 PT Individual Time Calculation (min): 30 min    Problem List:  Patient Active Problem List   Diagnosis Date Noted  . Hypoalbuminemia due to protein-calorie malnutrition ([place])   . Acute blood loss anemia   . Physical debility 09/28/2017  . Hypoxemia   . Takatsuki syndrome   . Pneumonia due to Haemophilus influenzae ([place])   . Tracheostomy status ([place])   . Delirium   . Chronic combined systolic and diastolic congestive heart failure ([place])   . Tobacco abuse   . Steroid-induced hyperglycemia   . Hypokalemia   . Leukocytosis   . SVT (supraventricular tachycardia) ([place])   . Acute respiratory failure with hypoxia ([place])   . Hypotension   . At risk for stress ulcer 09/12/2017  . AKI (acute kidney injury) ([place]) 09/11/2017  . Encounter for care related to feeding tube 09/11/2017  . No contraindication to deep vein thrombosis (DVT) prophylaxis 09/11/2017  . Shortness of breath 09/09/2017  . Acute on chronic respiratory failure ([place]) 09/08/2017  . COPD exacerbation ([place]) 07/15/2017  . Tobacco use 07/15/2017  . Acute combined systolic and diastolic heart failure (H[facility])     Past Medical History:  Past Medical History:  Diagnosis Date  . CHF (congestive heart failure) ([place])   . COPD (chronic obstructive pulmonary disease) ([place])   . NSTEMI (non-ST elevated myocardial infarction) [facility])    Past Surgical History: History reviewed. No pertinent surgical history.  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 She had difficulty with vent wean due to agitation and ultimately required tracheostomy prior to being weaned to ATC. Hospital course significant for Hemophilus tracheobronchitis/PNA treated with Augmentin, hypotension, delirium andmadeNPO due aspiration risk with signs of decreased airway protection.  Trach downsized to CFS #6 yesterday and she was  decannulated today. Prednisone being tapered off. MBS done 11/12 revealing normal swallow and she was started on regular diet --no follow up needed.     Patient with admission 8/30- 07/20/17 when she was diagnosed with CM and she has been living with sister. Therapy ongoing and CIR recommended due to debility. Patient transferred to CIR on 09/28/2017 .   Patient currently requires min with mobility secondary to muscle weakness, decreased cardiorespiratoy endurance, ataxia and decreased coordination and decreased standing balance, decreased postural control and decreased balance strategies.  Prior to hospitalization, patient was independent  with mobility and lived with Alone in a House home.  Home access is 8 stepsStairs to enter.  Patient will benefit from skilled PT intervention to maximize safe functional mobility, minimize fall risk and decrease caregiver burden for planned discharge home with intermittent assist.  Anticipate patient will benefit from follow up [facility] at discharge.  PT - End of Session Activity Tolerance: Tolerates 30+ min activity with multiple rests Endurance Deficit: Yes Endurance Deficit Description: requires seated rest breaks following short duration mobility activity PT Assessment Rehab Potential (ACUTE/IP ONLY): Excellent PT Patient demonstrates impairments in the following area(s): Balance;Endurance;Motor;Safety PT Transfers Functional  Problem(s): Bed Mobility;Bed to Chair;Car;Furniture PT Locomotion  Functional Problem(s): Ambulation;Stairs PT Plan PT Intensity: Minimum of 1-2 x/day ,45 to 90 minutes PT Frequency: 5 out of 7 days PT Duration Estimated Length of Stay: 5-7 days PT Treatment/Interventions: Ambulation/gait training;Discharge planning;Functional mobility training;Psychosocial support;Therapeutic Activities;Therapeutic Exercise;Neuromuscular re-education;Disease management/prevention;Balance/vestibular training;DME/adaptive equipment instruction;Pain management;UE/LE Strength taining/ROM;UE/LE Coordination activities;Stair training;Patient/family education;Community reintegration PT Transfers Anticipated Outcome(s): modI PT Locomotion Anticipated Outcome(s): modI in home, S in community PT Recommendation Recommendations for Other Services: Neuropsych consult;Therapeutic Recreation consult Therapeutic Recreation Interventions: Pet therapy;Kitchen group;Outing/community reintergration Follow Up Recommendations: Home health PT Patient destination: Home Equipment Recommended: None recommended by PT  Skilled Therapeutic Intervention Pt received seated in bed with individual present to assist pt with filing insurance paperwork; pt missed 30 min of session to complete. Upon therapists return, pt agreeable to treatment and denies pain. PT evaluation performed and completed with min guard/minA overall as described below d/t higher level balance impairments, occasional staggering when ambulating with cognitive dual task, head turns, direction changes. Educated pt on rehab process, goals, estimated LOS all to be determined once all therapy evaluations completed. Pt remained seated in w/c at end of session, all needs in reach.   PT Evaluation Precautions/Restrictions Precautions Precautions: Fall Restrictions Weight Bearing Restrictions: No General Chart Reviewed: Yes PT Amount of Missed Time (min): 30 Minutes PT Missed Treatment Reason: Other (Comment)(insurance paperwork) Response to  Previous Treatment: Patient reporting fatigue but able to participate. Family/Caregiver Present: No  Vital SignsTherapy Vitals Temp: 98.8 F (37.1 C) Temp Source: Oral Pulse Rate: (!) 112 Resp: 18 BP: 102/63 Patient Position (if appropriate): Sitting Oxygen Therapy SpO2: 100 % O2 Device: Not Delivered Pain  Denies pain Home Living/Prior Functioning Home Living Available Help at Discharge: Family;Available PRN/intermittently Type of Home: House Home Access: Stairs to enter [employer] of Steps: 8 steps Entrance Stairs-Rails: Can reach both;Left;Right  Lives With: Alone Prior Function Level of Independence: Independent with gait;Independent with homemaking with ambulation;Independent with basic ADLs  Able to Take Stairs?: Yes Driving: Yes Vocation: Full time employment Comments: completely independent with all ADLs and IADLs prior to admission Vision/Perception  Perception Perception: Within Functional Limits Praxis Praxis: Intact  Cognition Overall Cognitive Status: Within Functional Limits for tasks assessed Orientation Level: Oriented X4 Attention: Sustained Sustained Attention: Appears intact Memory: Appears intact Awareness: Appears intact Problem Solving: Appears intact Safety/Judgment: Appears intact Sensation Sensation Light Touch: Appears Intact Stereognosis: Not tested Hot/Cold: Not tested Proprioception: Appears Intact Coordination Gross Motor Movements are Fluid and Coordinated: Yes Heel Shin Test: WFL BLE Motor  Motor Motor - Skilled Clinical Observations: mild ataxia/scissoring gait pattern with distractions, generalized weakness  Mobility Bed Mobility Bed Mobility: Sit to Supine;Supine to Sit Supine to Sit: 6: Modified independent (Device/Increase time) Sit to Supine: 6: Modified independent (Device/Increase time) Transfers Transfers: Yes Stand Pivot Transfers: 5: Supervision Stand Pivot Transfer Details: Verbal cues for  precautions/safety Locomotion  Ambulation Ambulation: Yes Ambulation/Gait Assistance: 4: Min guard Ambulation Distance (Feet): 175 Feet Assistive device: None;Other (Comment)(occasionally reaches for hall rail d/t instability) Gait Gait: Yes Gait Pattern: Impaired Gait Pattern: Narrow base of support(occasional staggering, scissoring) Gait velocity: decreased for age/gender norms Stairs / Additional Locomotion Stairs: Yes Stairs Assistance: 4: Min guard Stair Management Technique: Two rails;Alternating pattern;Forwards Number of Stairs: 12 Height of Stairs: 6 Ramp: 4: Min assist Curb: 4: Min Administrator Mobility: No  Trunk/Postural Assessment  Cervical Assessment Cervical Assessment: Within Functional Limits Thoracic Assessment Thoracic Assessment: Within Functional Limits Lumbar Assessment Lumbar Assessment:  Within Functional Limits Postural Control Postural Control: Within Functional Limits  Balance Balance Balance Assessed: Yes Static Sitting Balance Static Sitting - Balance Support: No upper extremity supported Static Sitting - Level of Assistance: 6: Modified independent (Device/Increase time) Dynamic Sitting Balance Dynamic Sitting - Balance Support: No upper extremity supported Dynamic Sitting - Level of Assistance: 5: Stand by assistance Dynamic Sitting - Balance Activities: Lateral lean/weight shifting;Forward lean/weight shifting Static Standing Balance Static Standing - Balance Support: No upper extremity supported Static Standing - Level of Assistance: 5: Stand by assistance Dynamic Standing Balance Dynamic Standing - Balance Support: No upper extremity supported;During functional activity Dynamic Standing - Level of Assistance: 5: Stand by assistance;4: Min assist Extremity Assessment   BUE WFL for mobility tasks assessed; defer to OT exam for detailed assessment   RLE Assessment RLE Assessment: Within Functional Limits(grossly  4/5 throughout) LLE Assessment LLE Assessment: Within Functional Limits(grossly 4/5 throughout)   See Function Navigator for Current Functional Status.   Refer to Care Plan for Long Term Goals  Recommendations for other services: Neuropsych and Therapeutic Recreation  Pet therapy, Kitchen group and Outing/community reintegration  Discharge Criteria: Patient will be discharged from PT if patient refuses treatment 3 consecutive times without medical reason, if treatment goals not met, if there is a change in medical status, if patient makes no progress towards goals or if patient is discharged from hospital.  The above assessment, treatment plan, treatment alternatives and goals were discussed and mutually agreed upon: by patient  Luberta Mutter 09/29/2017, 4:23 PM

## 2017-09-30 ENCOUNTER — Inpatient Hospital Stay (HOSPITAL_COMMUNITY): Payer: Self-pay

## 2017-09-30 MED ORDER — PANTOPRAZOLE SODIUM 40 MG PO TBEC
40.0000 mg | DELAYED_RELEASE_TABLET | Freq: Every day | ORAL | Status: DC
  Administered 2017-09-30 – 2017-10-04 (×5): 40 mg via ORAL
  Filled 2017-09-30 (×4): qty 1

## 2017-09-30 NOTE — Progress Notes (Signed)
Patient complains of left sided upper abdominal pain.  Described as possible gas or abdominal cramps.  Vital signs obtained and PRN medication administered.  Patient voices relief within the hour.

## 2017-09-30 NOTE — Progress Notes (Signed)
Physical Therapy Session Note  Patient Details  Name: Caitlin [name] MRN: [id number] Date of Birth: [birth year]  Today's Date: 09/30/2017 PT Individual Time: 0900-0945 PT Individual Time Calculation (min): 45 min   Short Term Goals: Week 1:  PT Short Term Goal 1 (Week 1): =LTG due to estimated LOS  Skilled Therapeutic Interventions/Progress Updates:    Functional mobility in the room to perform gait in/out of bathroom and toileting needs as well as changing underwear and pants - overall supervision to occasional steadying assist level. Pt able to verbalize safer technique to sit in w/c to don pants than to perform in standing. Gait to/from therapy with close supervision to intermittent steadying assist for minor LOB during dual task activities or when pt becoming distracted in busy environment. Dynamic gait including head turns, lateral sidestepping to R and L, quick stop and starts, and retro gait with overall steadying assist. Administered Berg Balance test with results below. Patient demonstrates increased fall risk as noted by score of  41 /56 on Berg Balance Scale.  (<36= high risk for falls, close to 100%; 37-45 significant >80%; 46-51 moderate >50%; 52-55 lower >25%). Pt verbalized understanding. Stair negotiation without rails for NMR for balance retraining and functional strengthening. NMR on compliant surface while performing heel/toe raises without UE support - required external support to catch balance when LOB occurred. Instructed in fall recovery and performed floor transfers at supervision level - reviewed fall risks, signs and symptoms to look for after a fall, and safe technique to get up. In quadruped engaged in arm raises (5 each side), leg raises (5 each side), and then alternating opposite arm/leg raises (3 each side - requiring min assist for balance) to challenge balance and strengthen core.    Therapy Documentation Precautions:  Precautions Precautions:  Fall Restrictions Weight Bearing Restrictions: No   Vital Signs: Oxygen Therapy SpO2: 100 % O2 Device: Not Delivered Pain:  Denies pain.  Balance: Standardized Balance Assessment Standardized Balance Assessment: Berg Balance Test Berg Balance Test Sit to Stand: Able to stand without using hands and stabilize independently Standing Unsupported: Able to stand safely 2 minutes Sitting with Back Unsupported but Feet Supported on Floor or Stool: Able to sit safely and securely 2 minutes Stand to Sit: Sits safely with minimal use of hands Transfers: Able to transfer safely, minor use of hands Standing Unsupported with Eyes Closed: Able to stand 10 seconds with supervision Standing Ubsupported with Feet Together: Able to place feet together independently and stand for 1 minute with supervision From Standing, Reach Forward with Outstretched Arm: Can reach confidently >25 cm (10") From Standing Position, Pick up Object from Floor: Able to pick up shoe, needs supervision From Standing Position, Turn to Look Behind Over each Shoulder: Looks behind from both sides and weight shifts well Turn 360 Degrees: Able to turn 360 degrees safely but slowly Standing Unsupported, Alternately Place Feet on Step/Stool: Able to complete 4 steps without aid or supervision Standing Unsupported, One Foot in Front: Loses balance while stepping or standing Standing on One Leg: Unable to try or needs assist to prevent fall Total Score: 41   See Function Navigator for Current Functional Status.   Therapy/Group: Individual Therapy  Caitlin [name], PT, DPT  09/30/2017, 9:53 AM

## 2017-09-30 NOTE — Progress Notes (Signed)
Occupational Therapy Note  Patient Details  Name: Caitlin [name] MRN: [id number] Date of Birth: 1966-05-07  Today's Date: 09/30/2017 OT Individual Time: 1300-1330 OT Individual Time Calculation (min): 30 min   Pt denies pain Individual therapy  Pt initially engaged in Wii balance board activities.  Pt unable to use ankle/hip strategies to successfully complete tasks.  Pt transitioned to Wii bowling with increased success.  Pt stood during entire game and employed appropriate balance strategies/skills.  Pt returned to room and remained seated EOB with all needs within reach.    Leotis Shames [facility] 09/30/2017, 2:43 PM

## 2017-09-30 NOTE — Progress Notes (Signed)
Physical Therapy Session Note  Patient Details  Name: Caitlin [name] MRN: [id number] Date of Birth: 05-19-1966  Today's Date: 09/30/2017 PT Individual Time: 1500-1600 PT Individual Time Calculation (min): 60 min   Short Term Goals: Week 1:  PT Short Term Goal 1 (Week 1): =LTG due to estimated LOS  Skilled Therapeutic Interventions/Progress Updates:    Patient ambulated S level to therapy gym.  Participated in dynamic gait activities including ball bounce and ball toss to self during gait with S; ball toss over shoulder to therapist and catch opposite shoulder, head turns to ID playing cards during ambulation.  Noted some deviation from straight path with occasional min A to recover.  Patient negotiated 8 steps no rails and S.  Balance activities on compliant surface to include taps to cones for SLS, stepping over cones.  Standing therex with 1.5 # weights in standing to include heel raises, min squats, hip abduction, extension, hamstring curls, and hip flexion 2 x 10 reps.  Seated stretch to hamstrings x 20 sec and standing runners stretch for heel cords x 20 sec.  Patient performed wayfinding activity to gift shop with mod cues to use signage and questioning cues for current location.  Left in room on bed all needs in reach.   Therapy Documentation Precautions:  Precautions Precautions: Fall Restrictions Weight Bearing Restrictions: Yes Pain: Pain Assessment Faces Pain Scale: Hurts a little bit Pain Location: Abdomen Pain Orientation: Lower Pain Onset: Gradual Pain Intervention(s): Ambulation/increased activity   See Function Navigator for Current Functional Status.   Therapy/Group: Individual Therapy  Reginia Naas 09/30/2017, 3:25 PM

## 2017-09-30 NOTE — Care Management (Signed)
[facility] Individual Statement of Services  Patient Name:  TERRAH DECOSTER  Date:  09/30/2017  Welcome to the [facility].  Our goal is to provide you with an individualized program based on your diagnosis and situation, designed to meet your specific needs.  With this comprehensive rehabilitation program, you will be expected to participate in at least 3 hours of rehabilitation therapies Monday-Friday, with modified therapy programming on the weekends.  Your rehabilitation program will include the following services:  Physical Therapy (PT), Occupational Therapy (OT), Speech Therapy (ST), 24 hour per day rehabilitation nursing, Therapeutic Recreaction (TR), Case Management (Social Worker), Rehabilitation Medicine, Nutrition Services and Pharmacy Services  Weekly team conferences will be held on Tuesdays to discuss your progress.  Your Social Worker will talk with you frequently to get your input and to update you on team discussions.  Team conferences with you and your family in attendance may also be held.  Expected length of stay: 5-7 days  Overall anticipated outcome: modified independent  Depending on your progress and recovery, your program may change. Your Social Worker will coordinate services and will keep you informed of any changes. Your Social Worker's name and contact numbers are listed  below.  The following services may also be recommended but are not provided by the [facility] will be made to provide these services after discharge if needed.  Arrangements include referral to agencies that provide these services.  Your insurance has been verified to be:  None (SSD and medicaid applications begun) Your primary doctor is:  [facility]  Pertinent information  will be shared with your doctor and your insurance company.  Social Worker:  [place], [place] or ([phone]   Information discussed with and copy given to patient by: Lennart Pall, 09/30/2017, 1:57 PM

## 2017-09-30 NOTE — Progress Notes (Signed)
[place] PHYSICAL MEDICINE & REHABILITATION     PROGRESS NOTE  Subjective/Complaints:  Up in bed. Feels well. Had a good day with therapies yesterday. Did stairs  ROS: pt denies nausea, vomiting, diarrhea, cough, shortness of breath or chest pain    Objective: Vital Signs: Blood pressure 111/74, pulse 82, temperature 98.8 F (37.1 C), temperature source Oral, resp. rate 16, height 5\' 9"  (1.753 m), weight 55.1 kg (121 lb 8.4 oz), last menstrual period 09/09/2014, SpO2 99 %. No results found. Recent Labs    09/29/17 0621  WBC 7.8  HGB 11.2*  HCT 34.3*  PLT 416*   Recent Labs    09/28/17 0432 09/29/17 0621  NA 136 136  K 3.8 3.7  CL 108 106  GLUCOSE 98 98  BUN 20 18  CREATININE 0.60 0.59  CALCIUM 8.8* 9.1   CBG (last 3)  Recent Labs    09/27/17 2019 09/28/17 0754 09/28/17 1204  GLUCAP 108* 95 94    Wt Readings from Last 3 Encounters:  09/30/17 55.1 kg (121 lb 8.4 oz)  09/28/17 54.6 kg (120 lb 4.8 oz)  09/07/17 59 kg (130 lb)    Physical Exam:  BP 111/74 (BP Location: Left Arm)   Pulse 82   Temp 98.8 F (37.1 C) (Oral)   Resp 16   Ht 5\' 9"  (1.753 m)   Wt 55.1 kg (121 lb 8.4 oz)   LMP 09/09/2014 (Approximate)   SpO2 99%   BMI 17.95 kg/m  Constitutional: She appears well-developed and well-nourished. No distress.  HENT: Normocephalic and atraumatic.  Eyes: EOM are normal. No discharge.  Neck: Dry dressing on neck, stoma slightly open still Cardiovascular: RRR without murmur. No JVD . Respiratory:CTA Bilaterally without wheezes or rales. Normal effort   GI: Bowel sounds are normal. She exhibits no distension.  Musculoskeletal: She exhibits no edema or tenderness.  Neurological: She is alert and oriented to person, place, and time.  Motor: 4+/5 grossly throughout--stable Sensation intact to light touch  Skin: Skin is warm and dry.     Psychiatric: She has a normal mood and affect. Her behavior is normal. Judgment and thought content normal.    Assessment/Plan: 1. Functional deficits secondary to debility which require 3+ hours per day of interdisciplinary therapy in a comprehensive inpatient rehab setting. Physiatrist is providing close team supervision and 24 hour management of active medical problems listed below. Physiatrist and rehab team continue to assess barriers to discharge/monitor patient progress toward functional and medical goals.  Function:  Bathing Bathing position Bathing activity did not occur: Refused    Bathing parts      Bathing assist        Upper Body Dressing/Undressing Upper body dressing   What is the patient wearing?: Pull over shirt/dress     Pull over shirt/dress - Perfomed by patient: Thread/unthread right sleeve, Thread/unthread left sleeve, Put head through opening, Pull shirt over trunk          Upper body assist Assist Level: Set up, Supervision or verbal cues   Set up : To obtain clothing/put away  Lower Body Dressing/Undressing Lower body dressing   What is the patient wearing?: Underwear, Pants, Non-skid slipper socks Underwear - Performed by patient: Thread/unthread right underwear leg, Thread/unthread left underwear leg, Pull underwear up/down   Pants- Performed by patient: Thread/unthread right pants leg, Thread/unthread left pants leg, Pull pants up/down   Non-skid slipper socks- Performed by patient: Don/doff right sock, Don/doff left sock  Lower body assist Assist for lower body dressing: Touching or steadying assistance (Pt > 75%)      Toileting Toileting   Toileting steps completed by patient: Adjust clothing prior to toileting, Performs perineal hygiene, Adjust clothing after toileting   Toileting Assistive Devices: Grab bar or rail  Toileting assist Assist level: Touching or steadying assistance (Pt.75%)   Transfers Chair/bed transfer   Chair/bed transfer method: Stand pivot, Ambulatory Chair/bed transfer assist level: Touching or  steadying assistance (Pt > 75%) Chair/bed transfer assistive device: Armrests     Locomotion Ambulation     Max distance: 150 Assist level: Supervision or verbal cues   Wheelchair Wheelchair activity did not occur: N/A        Cognition Comprehension Comprehension assist level: Follows complex conversation/direction with no assist  Expression Expression assist level: Expresses complex ideas: With no assist  Social Interaction Social Interaction assist level: Interacts appropriately with others - No medications needed.  Problem Solving Problem solving assist level: Solves complex problems: Recognizes & self-corrects  Memory Memory assist level: Complete Independence: No helper    Medical Problem List and Plan: 1.  Generalized weakness and poor activity tolerance secondary to debility.   -continue CIR therapies. Expect short stay 2.  DVT Prophylaxis/Anticoagulation: Pharmaceutical: Lovenox 3. Pain Management: N/A 4. Mood: LCSW to follow for evaluation and support.  5. Neuropsych: This patient is capable of making decisions on her own behalf. 6. Skin/Wound Care: routine pressure relief measures.  7. Fluids/Electrolytes/Nutrition:  Monitor I/Os   BMP within acceptable range on 11/14 9. AECOPD: continue Pulmicort bid a Brovana bid. Encourage IS.   Steroids to be tapered off 11/16.  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 Continue to hold lasix, lisinopril and metoprolol due to hypotension.  11. Hemophilus PNA: To continue augmentin to complete 14 day course antibiotic regimen --completed. Continue robitussin tid for now.  12. Steroid induced hyperglycemia: nearly normal   Monitor with increased mobility 13. Hypoalbuminemia   Supplement initiated on 11/14 14. ABLA   Hb 11.2 on 11/14   Cont to monitor  LOS (Days) 2 A FACE TO FACE EVALUATION WAS PERFORMED  Mung Rinker T 09/30/2017 8:43 AM

## 2017-09-30 NOTE — Progress Notes (Signed)
Occupational Therapy Session Note  Patient Details  Name: Caitlin [name] MRN: [id number] Date of Birth: [birth year]  Today's Date: 09/30/2017 OT Individual Time: 1000-1055 OT Individual Time Calculation (min): 55 min    Short Term Goals: Week 1:  OT Short Term Goal 1 (Week 1): STGs = LTGs secondary to estimated short LOS  Skilled Therapeutic Interventions/Progress Updates:    Pt resting EOB upon arrival.  Pt stated she had already bathed and changed clothing (verified by NT) without assistance.  Pt amb without AD to ADL apartment to practice stepping over into tub and simulate laundry tasks.  Pt also prepared a fried egg safely and appropriately.  Pt amb back to dayroom and engaged in game of catch on [employer] without assistance.  Focus on safety awreness, functional amb without AD, and dynamic standing balance tasks to increase independence with BADLs. Pt returned to room and remained seated EOB with all needs within reach.   Therapy Documentation Precautions:  Precautions Precautions: Fall Restrictions Weight Bearing Restrictions: No General:   Vital Signs: Oxygen Therapy SpO2: 99 % O2 Device: Not Delivered Pain:   ADL:   Vision   Perception    Praxis   Exercises:   Other Treatments:    See Function Navigator for Current Functional Status.   Therapy/Group: Individual Therapy  Leroy Libman 09/30/2017, 10:56 AM

## 2017-09-30 NOTE — Progress Notes (Signed)
Social Work  Social Work Assessment and Plan  Patient Details  Name: Caitlin [name] MRN: [id number] Date of Birth: 02-08-1966  Today's Date: 09/30/2017  Problem List:  Patient Active Problem List   Diagnosis Date Noted  . Hypoalbuminemia due to protein-calorie malnutrition ([place])   . Acute blood loss anemia   . Physical debility 09/28/2017  . Hypoxemia   . Takatsuki syndrome   . Pneumonia due to Haemophilus influenzae ([place])   . Tracheostomy status ([place])   . Delirium   . Chronic combined systolic and diastolic congestive heart failure ([place])   . Tobacco abuse   . Steroid-induced hyperglycemia   . Hypokalemia   . Leukocytosis   . SVT (supraventricular tachycardia) ([place])   . Acute respiratory failure with hypoxia ([place])   . Hypotension   . At risk for stress ulcer 09/12/2017  . AKI (acute kidney injury) ([place]) 09/11/2017  . Encounter for care related to feeding tube 09/11/2017  . No contraindication to deep vein thrombosis (DVT) prophylaxis 09/11/2017  . Shortness of breath 09/09/2017  . Acute on chronic respiratory failure ([place]) 09/08/2017  . COPD exacerbation ([place]) 07/15/2017  . Tobacco use 07/15/2017  . Acute combined systolic and diastolic heart failure ([facility])    Past Medical History:  Past Medical History:  Diagnosis Date  . CHF (congestive heart failure) ([place])   . COPD (chronic obstructive pulmonary disease) ([place])   . NSTEMI (non-ST elevated myocardial infarction) [facility])    Past Surgical History:  Past Surgical History:  Procedure Laterality Date  . LEFT HEART CATH AND CORONARY ANGIOGRAPHY N/A 07/15/2017   Procedure: LEFT HEART CATH AND CORONARY ANGIOGRAPHY;  Surgeon: Martinique, Peter M, MD;  Location: [facility];  Service: Cardiovascular;  Laterality: N/A;   Social History:  reports that she has quit smoking. Her smoking use included cigarettes. She smoked 1.00 pack per day. she has never used smokeless tobacco. She reports that she drinks alcohol. She reports that  she uses drugs. Drug: Marijuana.  Family / Support Systems Marital Status: Single Patient Roles: Parent, Other (Comment)(sister) Children: son, Vicente Serene Baldo Ash) @ (C) [phone];  son, Hulan Fess [facility]) @ ([phone] Other Supports: sister, Renata Caprice (pt living with her) @ (C) [phone];  sister, Lelan Pons @ [phone] Anticipated Caregiver: Sister and niece when sister is at work  Ability/Limitations of Caregiver: Sister works nights  Caregiver Availability: 24/7 Family Dynamics: Pt describes good relationship with children and other family.  States her sister, Stann Ore, "is trying to get me to just move in with her.  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 Return to Work Plans: Pt reports "the doctors have told me it's time to stop." Legal Hisotry/Current Legal Issues: None Guardian/Conservator: None - per MD, pt is capble of making decisions on her own behalf   Abuse/Neglect Abuse/Neglect Assessment Can Be Completed: Yes Physical Abuse: Denies Verbal Abuse: Denies Sexual Abuse: Denies Exploitation of patient/patient's resources: Denies Self-Neglect: Denies  Emotional Status Pt's affect, behavior adn adjustment status: Pt very pleasant, talkative and completest assessment interview without any difficulty.  She is pleased with progress so far and hopeful she will be ready for d/c early next week.  She denies any s/s of emotional distress - will monitor. Recent Psychosocial Issues: Initially hospitalized ~ 3 mos ago and then confirmed COPD diagnosis. Pyschiatric  History: None Substance Abuse History: None  Patient / Family Perceptions, Expectations & Goals Pt/Family understanding of illness & functional limitations: Pt has a good understanding of her COPD and cardiac issues.  Good  awareness of potential assist needs upon d/c. Premorbid pt/family roles/activities: Pt was independent overall until 3 mos ago.  Sister, Lawerance Bach, has been caregiver. Anticipated changes in roles/activities/participation: little change anticipated with mod ind goals and plan to return home with sister. Pt/family expectations/goals: "I need to get my strength and balance better."  [employer]: Other (Comment)([facility]) Premorbid Home Care/DME Agencies: None Transportation available at discharge: yes  Discharge Planning Living Arrangements: Other relatives Support Systems: Other relatives, Children Type of Residence: Private residence Insurance Resources: Teacher, adult education Resources: Family Support Financial Screen Referred: Previously completed Living Expenses: Lives with family Money Management: Patient Does the patient have any problems obtaining your medications?: Yes (Describe)(no insurance) Home Management: pt Patient/Family Preliminary Plans: Pt to return home with sister and her family able to provide 24/7 support Social Work Anticipated Follow Up Needs: HH/OP Expected length of stay: 5-9 days   Clinical Impression Very pleasant woman here for debility following COPD exacerbation/ went/ trach.  Excellent family support and pt with mod ind goals.  Team anticipating a short LOS.  Very motivated for tx and denies any concerns about d/c.  Will follow d/c planning needs.  Mykenzi Vanzile 09/30/2017, 1:56 PM

## 2017-10-01 ENCOUNTER — Inpatient Hospital Stay (HOSPITAL_COMMUNITY): Payer: Self-pay

## 2017-10-01 ENCOUNTER — Inpatient Hospital Stay (HOSPITAL_COMMUNITY): Payer: Self-pay | Admitting: Physical Therapy

## 2017-10-01 NOTE — Progress Notes (Signed)
Physical Therapy Session Note  Patient Details  Name: ZAKIRA RESSEL MRN: [id number] Date of Birth: 1966/02/28  Today's Date: 10/01/2017 PT Individual Time: 0900-0930 PT Individual Time Calculation (min): 30 min   Short Term Goals: Week 1:  PT Short Term Goal 1 (Week 1): =LTG due to estimated LOS  Skilled Therapeutic Interventions/Progress Updates:   Patient seen for focus on standing balance and coordination.  Gait to dayroom with S and no device.  Standing balance activities with Wii bowling with S.  Performed toe and heel walking with UE support for strength and balance.  Seated hamstring stretch 2 x 20 sec each leg and standing heel cord stretch 2 x 20 sec each foot.  Coordination activity mod A for braiding and min guard with UE support on railing.  Gait to room S and left with needs in reach.   Therapy Documentation Precautions:  Precautions Precautions: Fall Restrictions Weight Bearing Restrictions: Yes Pain: Pain Assessment Pain Assessment: No/denies pain   See Function Navigator for Current Functional Status.   Therapy/Group: Individual Therapy  Reginia Naas 10/01/2017, 10:23 AM

## 2017-10-01 NOTE — Progress Notes (Signed)
Requesting something to help her sleep, full dose of Trazodone made her hallucinate and feel out of control, 25 mg provided per patient's request and administered. Monitor and assisted, bed alarm and call- bell in place

## 2017-10-01 NOTE — Progress Notes (Signed)
[place] PHYSICAL MEDICINE & REHABILITATION     PROGRESS NOTE  Subjective/Complaints:  Up sitting in her chair.  No new complaints.  Feels well and had another good day of therapy yesterday  ROS: pt denies nausea, vomiting, diarrhea, cough, shortness of breath or chest pain   Objective: Vital Signs: Blood pressure 118/79, pulse 78, temperature 98.2 F (36.8 C), temperature source Oral, resp. rate 17, height 5\' 9"  (1.753 m), weight 55.3 kg (122 lb), last menstrual period 09/09/2014, SpO2 100 %. No results found. Recent Labs    09/29/17 0621  WBC 7.8  HGB 11.2*  HCT 34.3*  PLT 416*   Recent Labs    09/29/17 0621  NA 136  K 3.7  CL 106  GLUCOSE 98  BUN 18  CREATININE 0.59  CALCIUM 9.1   CBG (last 3)  Recent Labs    09/28/17 1204  GLUCAP 94    Wt Readings from Last 3 Encounters:  10/01/17 55.3 kg (122 lb)  09/28/17 54.6 kg (120 lb 4.8 oz)  09/07/17 59 kg (130 lb)    Physical Exam:  BP 118/79 (BP Location: Right Arm)   Pulse 78   Temp 98.2 F (36.8 C) (Oral)   Resp 17   Ht 5\' 9"  (1.753 m)   Wt 55.3 kg (122 lb)   LMP 09/09/2014 (Approximate)   SpO2 100%   BMI 18.02 kg/m  Constitutional: She appears well-developed and well-nourished. No distress.  HENT: Normocephalic and atraumatic.  Eyes: EOM are normal. No discharge.  Neck: Dry dressing on neck, no leakage of air through wound today Cardiovascular: RRR without murmur. No JVD . Respiratory:CTA Bilaterally without wheezes or rales. Normal effort   GI: Bowel sounds are normal. She exhibits no distension.  Musculoskeletal: She exhibits no edema or tenderness.  Neurological: She is alert and oriented to person, place, and time.  Motor: 4+/5 grossly throughout--nochange Sensation intact to light touch  Skin: Skin is warm and dry.     Psychiatric: She has a normal mood and affect. Her behavior is normal. Judgment and thought content normal.   Assessment/Plan: 1. Functional deficits secondary to debility  which require 3+ hours per day of interdisciplinary therapy in a comprehensive inpatient rehab setting. Physiatrist is providing close team supervision and 24 hour management of active medical problems listed below. Physiatrist and rehab team continue to assess barriers to discharge/monitor patient progress toward functional and medical goals.  Function:  Bathing Bathing position Bathing activity did not occur: Refused Position: Wheelchair/chair at sink  Bathing parts Body parts bathed by patient: Right arm, Left arm, Chest, Abdomen, Front perineal area, Buttocks, Right upper leg, Left upper leg, Right lower leg, Left lower leg, Back    Bathing assist Assist Level: Supervision or verbal cues      Upper Body Dressing/Undressing Upper body dressing   What is the patient wearing?: Pull over shirt/dress     Pull over shirt/dress - Perfomed by patient: Thread/unthread right sleeve, Thread/unthread left sleeve, Put head through opening, Pull shirt over trunk          Upper body assist Assist Level: Set up, Supervision or verbal cues   Set up : To obtain clothing/put away  Lower Body Dressing/Undressing Lower body dressing   What is the patient wearing?: Underwear, Pants, Socks, Shoes Underwear - Performed by patient: Thread/unthread right underwear leg, Thread/unthread left underwear leg, Pull underwear up/down   Pants- Performed by patient: Thread/unthread right pants leg, Thread/unthread left pants leg, Pull pants up/down  Non-skid slipper socks- Performed by patient: Don/doff right sock, Don/doff left sock   Socks - Performed by patient: Don/doff right sock, Don/doff left sock   Shoes - Performed by patient: Don/doff right shoe, Don/doff left shoe, Fasten right, Fasten left            Lower body assist Assist for lower body dressing: Supervision or verbal cues      Toileting Toileting   Toileting steps completed by patient: Adjust clothing prior to toileting, Performs  perineal hygiene, Adjust clothing after toileting   Toileting Assistive Devices: Grab bar or rail  Toileting assist Assist level: Supervision or verbal cues   Transfers Chair/bed transfer   Chair/bed transfer method: Ambulatory Chair/bed transfer assist level: Supervision or verbal cues Chair/bed transfer assistive device: Armrests     Locomotion Ambulation     Max distance: 300 Assist level: Supervision or verbal cues   Wheelchair Wheelchair activity did not occur: N/A        Cognition Comprehension Comprehension assist level: Follows complex conversation/direction with no assist  Expression Expression assist level: Expresses complex ideas: With no assist  Social Interaction Social Interaction assist level: Interacts appropriately with others - No medications needed.  Problem Solving Problem solving assist level: Solves complex problems: Recognizes & self-corrects  Memory Memory assist level: Complete Independence: No helper    Medical Problem List and Plan: 1.  Generalized weakness and poor activity tolerance secondary to debility.   -continue CIR therapies.    -Team feels that she can go home on Monday 2.  DVT Prophylaxis/Anticoagulation: Pharmaceutical: Lovenox 3. Pain Management: N/A 4. Mood: LCSW to follow for evaluation and support.  5. Neuropsych: This patient is capable of making decisions on her own behalf. 6. Skin/Wound Care: routine pressure relief measures.  7. Fluids/Electrolytes/Nutrition:  Monitor I/Os   BMP within acceptable range on 11/14   -Good p.o. intake 9. AECOPD: continue Pulmicort bid a Brovana bid. Encourage IS.   Steroids tapering off 11/16.  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 Continue to hold lasix, lisinopril and metoprolol due to hypotension.  11. Hemophilus PNA: To continue augmentin to complete 14 day course antibiotic regimen --completed. Continue robitussin tid for now.  12. Steroid induced hyperglycemia: nearly  normal   Monitor with increased mobility 13. Hypoalbuminemia   Supplement initiated on 11/14 14. ABLA   Hb 11.2 on 11/14   Cont to monitor  LOS (Days) 3 A FACE TO FACE EVALUATION WAS PERFORMED  SWARTZ,ZACHARY T 10/01/2017 8:50 AM

## 2017-10-01 NOTE — Progress Notes (Signed)
Occupational Therapy Session Note  Patient Details  Name: Caitlin [name] MRN: [id number] Date of Birth: Nov 22, 1965  Today's Date: 10/01/2017 OT Individual Time: 0017-4944 OT Individual Time Calculation (min): 58 min    Short Term Goals: Week 1:  OT Short Term Goal 1 (Week 1): STGs = LTGs secondary to estimated short LOS  Skilled Therapeutic Interventions/Progress Updates:    Pt initially enaged in BADLs (bathing/dressing with sit<>stand from seat). Pt declined taking a shower at this time.  Pt completed all BADLs at distant supervision level.  Pt transitioned to ADL apartment and completed simple home mgmt tasks at ambulatory level; retrieving items from floor and carrying to designated place.  Pt returned to day room for ongoing dynamic standing activities with ball.  Pt returned to room and remained seated EOB with all needs within reach.   Therapy Documentation Precautions:  Precautions Precautions: Fall Restrictions Weight Bearing Restrictions: Yes Pain:  Pt denies pain   See Function Navigator for Current Functional Status.   Therapy/Group: Individual Therapy  Leroy Libman 10/01/2017, 8:03 AM

## 2017-10-01 NOTE — Progress Notes (Signed)
Physical Therapy Session Note  Patient Details  Name: Caitlin [name] MRN: [id number] Date of Birth: [birth year]  Today's Date: 10/01/2017 PT Individual Time: 1000-1100 PT Individual Time Calculation (min): 60 min   Short Term Goals: Week 1:  PT Short Term Goal 1 (Week 1): =LTG due to estimated LOS  Skilled Therapeutic Interventions/Progress Updates: Pt received seated in bed, denies pain and agreeable to treatment. Gait throughout hospital on level surfaces and incline/decline with S overall; ambulated >1000' overall. Engaged pt in dynamic standing balance while playing Wii games including tennis, boxing and bowling. Occasional seated rest breaks needed d/t fatigue. Returned to room with gait as above. Remained seated in bed with all needs in reach.      Therapy Documentation Precautions:  Precautions Precautions: Fall Restrictions Weight Bearing Restrictions: Yes  See Function Navigator for Current Functional Status.   Therapy/Group: Individual Therapy  Luberta Mutter 10/01/2017, 3:55 PM

## 2017-10-01 NOTE — Progress Notes (Signed)
Occupational Therapy Note  Patient Details  Name: Caitlin [name] MRN: [id number] Date of Birth: 07-06-1966  Today's Date: 10/01/2017 OT Individual Time: 1100-1155 OT Individual Time Calculation (min): 55 min   Pt denies pain Individual Therapy  Pt engaged in dynamic standing activities with Wii game console.  1) Bowling while standing on [employer] 2) Tennis and 3) boxing.  Pt completed all tasks at supervision level.  Focus on activity tolerance, dynamic standing balance, and safety awareness.   Leotis Shames [facility] 10/01/2017, 12:04 PM

## 2017-10-01 NOTE — IPOC Note (Signed)
Overall Plan of Care [facility]) Patient Details Name: LAJOYA DOMBEK MRN: [id number] DOB: 03/05/1966  Admitting Diagnosis: <principal problem not specified> debility  Hospital Problems: Active Problems:   Physical debility   Hypoalbuminemia due to protein-calorie malnutrition (HCC)   Acute blood loss anemia     Functional Problem List: Nursing Bladder, Bowel  PT Balance, Endurance, Motor, Safety  OT Balance, Endurance, Safety  SLP    TR         Basic ADL's: OT Grooming, Bathing, Dressing, Toileting     Advanced  ADL's: OT Simple Meal Preparation, Laundry     Transfers: PT Bed Mobility, Bed to Chair, Car, Manufacturing systems engineer, Metallurgist: PT Ambulation, Stairs     Additional Impairments: OT None  SLP        TR      Anticipated Outcomes Item Anticipated Outcome  Self Feeding n/a  Swallowing      Basic self-care  mod I   Toileting  mod I    Bathroom Transfers mod I toileting and supervision for showers  Bowel/Bladder  with min assist  Transfers  modI  Locomotion  modI in home, S in community  Communication     Cognition     Pain  acceptable pain level of less than 4  Safety/Judgment  will be discharged with mod indepent   Therapy Plan: PT Intensity: Minimum of 1-2 x/day ,45 to 90 minutes PT Frequency: 5 out of 7 days PT Duration Estimated Length of Stay: 5-7 days OT Intensity: Minimum of 1-2 x/day, 45 to 90 minutes OT Frequency: 5 out of 7 days OT Duration/Estimated Length of Stay: 5-7 days      Team Interventions: Nursing Interventions Patient/Family Education, Pain Management, Medication Management, Discharge Planning  PT interventions Ambulation/gait training, Discharge planning, Functional mobility training, Psychosocial support, Therapeutic Activities, Therapeutic Exercise, Neuromuscular re-education, Disease management/prevention, Training and development officer, DME/adaptive equipment instruction, Pain management, UE/LE Strength  taining/ROM, UE/LE Coordination activities, Stair training, Patient/family education, Community reintegration  OT Interventions Training and development officer, Neuromuscular re-education, Self Care/advanced ADL retraining, Therapeutic Exercise, DME/adaptive equipment instruction, UE/LE Strength taining/ROM, Academic librarian, Barrister's clerk education, UE/LE Coordination activities, Discharge planning, Functional mobility training, Psychosocial support, Therapeutic Activities  SLP Interventions    TR Interventions    SW/CM Interventions Discharge Planning, Psychosocial Support, Patient/Family Education   Barriers to Discharge MD  Medical stability  Nursing Decreased caregiver support    PT      OT   none known at this time  SLP      SW       Team Discharge Planning: Destination: PT-Home ,OT- Home , SLP-Home Projected Follow-up: PT-Home health PT, OT-  Home health OT, SLP-None Projected Equipment Needs: PT-None recommended by PT, OT- To be determined, SLP-None recommended by SLP Equipment Details: PT- , OT-  Patient/family involved in discharge planning: PT- Patient,  OT-Patient, SLP-Patient  MD ELOS: 5-7 days Medical Rehab Prognosis:  Excellent Assessment: The patient has been admitted for CIR therapies with the diagnosis of debility. The team will be addressing functional mobility, strength, stamina, balance, safety, adaptive techniques and equipment, self-care, bowel and bladder mgt, patient and caregiver education, activity tolerance, community integration, he has support. Goals have been set at modified independent for mobility in all self-care tasks.    Meredith Staggers, MD, FAAPMR      See Team Conference Notes for weekly updates to the plan of care

## 2017-10-02 ENCOUNTER — Inpatient Hospital Stay (HOSPITAL_COMMUNITY): Payer: Self-pay

## 2017-10-02 DIAGNOSIS — Z789 Other specified health status: Secondary | ICD-10-CM

## 2017-10-02 DIAGNOSIS — J449 Chronic obstructive pulmonary disease, unspecified: Secondary | ICD-10-CM

## 2017-10-02 NOTE — Progress Notes (Signed)
Occupational Therapy Session Note  Patient Details  Name: Caitlin [name] MRN: [id number] Date of Birth: Mar 28, 1966  Today's Date: 10/02/2017 OT Individual Time: 3710-6269 OT Individual Time Calculation (min): 72 min    Short Term Goals: Week 1:  OT Short Term Goal 1 (Week 1): STGs = LTGs secondary to estimated short LOS  Skilled Therapeutic Interventions/Progress Updates:    1:1. Pt reporting 10/10 headache and RN administered medication. Focus of session on standing balance, ambulation without AD, endurance and stairs. Pt stands on blue foam pad in staggered stance with supervision for balance to squat and grab golf balls for putt putt. Pt able to retrieve items off of floor without LOB. Pt ambulates while toss/catching ball or climbs 2 flights of stairs while naming animals starting with each letter of alphabet in order taking 30 min to complete for focus on dual task processing. Pt unable to think of 4 letters and records and "google's" answers on computer to find out names/information on unfamiliar animals to improve computer skills. Exited session after pt participates in guided imagery for continued headache pain management. Pt reports 0/10 pain at end of session.   Therapy Documentation Precautions:  Precautions Precautions: Fall Restrictions Weight Bearing Restrictions: Yes  See Function Navigator for Current Functional Status.   Therapy/Group: Individual Therapy  Tonny Branch 10/02/2017, 6:55 AM

## 2017-10-02 NOTE — Progress Notes (Signed)
Occupational Therapy Session Note  Patient Details  Name: Caitlin [name] MRN: [id number] Date of Birth: 1965-12-30  Today's Date: 10/02/2017 OT Individual Time: 1500-1555 OT Individual Time Calculation (min): 55 min    Short Term Goals: Week 1:  OT Short Term Goal 1 (Week 1): STGs = LTGs secondary to estimated short LOS  Skilled Therapeutic Interventions/Progress Updates:    1:1. Pt ambulates throughout session without AD at MOD I level with no LOB. Pt engages in med management task with increased time and mod VC to follow instructions to correctly sort pills into organizer. Educated pt on taking pills as prescribed on bottle, where to Copy and to ask pharmacist to help select appropriat organizer according to how many times daily she will be taking pills. Pt participates in yoga activity in various poses to improve strength, flexibility, balance, and floor recovery while standing on soft mat with supervision to CGA. Discussed various leisure activities, exercises and modifications to improve participation d/t pt COPD. Exited session with pt seated in bed with call light in reach and all needs met.   Therapy Documentation Precautions:  Precautions Precautions: Fall Restrictions Weight Bearing Restrictions: Yes  See Function Navigator for Current Functional Status.   Therapy/Group: Individual Therapy  Tonny Branch 10/02/2017, 3:55 PM

## 2017-10-02 NOTE — Progress Notes (Signed)
[place] PHYSICAL MEDICINE & REHABILITATION     PROGRESS NOTE  Subjective/Complaints:  Patient seen sitting up in bed this morning.  She states she slept well overnight.  She states she has a headache this morning, but does not want to take any medications.  Communicated with therapist regarding plan for discharge.  ROS: Denies nausea, vomiting, diarrhea, shortness of breath or chest pain   Objective: Vital Signs: Blood pressure (!) 101/51, pulse 95, temperature 98 F (36.7 C), temperature source Oral, resp. rate 20, height 5\' 9"  (1.753 m), weight 55.3 kg (122 lb), last menstrual period 09/09/2014, SpO2 98 %. No results found. No results for input(s): WBC, HGB, HCT, PLT in the last 72 hours. No results for input(s): NA, K, CL, GLUCOSE, BUN, CREATININE, CALCIUM in the last 72 hours.  Invalid input(s): CO CBG (last 3)  No results for input(s): GLUCAP in the last 72 hours.  Wt Readings from Last 3 Encounters:  10/02/17 55.3 kg (122 lb)  09/28/17 54.6 kg (120 lb 4.8 oz)  09/07/17 59 kg (130 lb)    Physical Exam:  BP (!) 101/51 (BP Location: Right Arm)   Pulse 95   Temp 98 F (36.7 C) (Oral)   Resp 20   Ht 5\' 9"  (1.753 m)   Wt 55.3 kg (122 lb)   LMP 09/09/2014 (Approximate)   SpO2 98%   BMI 18.02 kg/m  Constitutional: She appears well-developed and well-nourished. No distress.  HENT: Normocephalic and atraumatic.  Eyes: EOM are normal.  No discharge.  Neck: Dry dressing on neck Cardiovascular: RRR. No JVD . Respiratory:CTA Bilaterally. Normal effort   GI: Bowel sounds are normal. She exhibits no distension.  Musculoskeletal: She exhibits no edema or tenderness.  Neurological: She is alert and oriented to person, place, and time.  Motor: 4+/5 grossly throughout (stable) Skin: Skin is warm and dry.  Psychiatric: She has a normal mood and affect. Her behavior is normal. Judgment and thought content normal.   Assessment/Plan: 1. Functional deficits secondary to debility  which require 3+ hours per day of interdisciplinary therapy in a comprehensive inpatient rehab setting. Physiatrist is providing close team supervision and 24 hour management of active medical problems listed below. Physiatrist and rehab team continue to assess barriers to discharge/monitor patient progress toward functional and medical goals.  Function:  Bathing Bathing position Bathing activity did not occur: Refused Position: Wheelchair/chair at sink  Bathing parts Body parts bathed by patient: Right arm, Left arm, Chest, Abdomen, Front perineal area, Buttocks, Right upper leg, Left upper leg, Right lower leg, Left lower leg, Back    Bathing assist Assist Level: Supervision or verbal cues      Upper Body Dressing/Undressing Upper body dressing   What is the patient wearing?: Pull over shirt/dress     Pull over shirt/dress - Perfomed by patient: Thread/unthread right sleeve, Thread/unthread left sleeve, Put head through opening, Pull shirt over trunk          Upper body assist Assist Level: Set up, Supervision or verbal cues   Set up : To obtain clothing/put away  Lower Body Dressing/Undressing Lower body dressing   What is the patient wearing?: Underwear, Pants, Socks, Shoes Underwear - Performed by patient: Thread/unthread right underwear leg, Thread/unthread left underwear leg, Pull underwear up/down   Pants- Performed by patient: Thread/unthread right pants leg, Thread/unthread left pants leg, Pull pants up/down   Non-skid slipper socks- Performed by patient: Don/doff right sock, Don/doff left sock   Socks - Performed by  patient: Don/doff right sock, Don/doff left sock   Shoes - Performed by patient: Don/doff right shoe, Don/doff left shoe, Fasten right, Fasten left            Lower body assist Assist for lower body dressing: Supervision or verbal cues      Toileting Toileting   Toileting steps completed by patient: Adjust clothing prior to toileting, Performs  perineal hygiene, Adjust clothing after toileting   Toileting Assistive Devices: Grab bar or rail  Toileting assist Assist level: Supervision or verbal cues   Transfers Chair/bed transfer   Chair/bed transfer method: Ambulatory Chair/bed transfer assist level: No Help, no cues, assistive device, takes more than a reasonable amount of time Chair/bed transfer assistive device: Armrests     Locomotion Ambulation     Max distance: 200' Assist level: Supervision or verbal cues   Wheelchair Wheelchair activity did not occur: N/A        Cognition Comprehension Comprehension assist level: Follows complex conversation/direction with no assist  Expression Expression assist level: Expresses complex ideas: With no assist  Social Interaction Social Interaction assist level: Interacts appropriately with others - No medications needed.  Problem Solving Problem solving assist level: Solves complex problems: Recognizes & self-corrects  Memory Memory assist level: Complete Independence: No helper    Medical Problem List and Plan: 1.  Generalized weakness and poor activity tolerance secondary to debility.   -continue CIR.   Plan for DC on Monday 2.  DVT Prophylaxis/Anticoagulation: Pharmaceutical: Lovenox 3. Pain Management: N/A 4. Mood: LCSW to follow for evaluation and support.  5. Neuropsych: This patient is capable of making decisions on her own behalf. 6. Skin/Wound Care: routine pressure relief measures.  7. Fluids/Electrolytes/Nutrition:  Monitor I/Os   BMP within acceptable range on 11/14 9. AECOPD: continue Pulmicort bid a Brovana bid. Encourage IS.   Continue to taper steroids until 11/16.  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 Continue to hold lasix, lisinopril and metoprolol due to hypotension.  11. Hemophilus PNA: Completed 14 day course antibiotic regimen. Continue robitussin tid for now.  12. Steroid induced hyperglycemia:    Monitor with increased  mobility 13. Hypoalbuminemia   Supplement initiated on 11/14 14. ABLA   Hb 11.2 on 11/14   Cont to monitor  LOS (Days) 4 A FACE TO FACE EVALUATION WAS PERFORMED  Jere Bostrom Lorie Phenix 10/02/2017 2:22 PM

## 2017-10-02 NOTE — Progress Notes (Signed)
Physical Therapy Session Note  Patient Details  Name: Caitlin [name] MRN: [id number] Date of Birth: 12/29/1965  Today's Date: 10/02/2017 PT Individual Time: 7034-0352 PT Individual Time Calculation (min): 55 min   Short Term Goals: Week 1:  PT Short Term Goal 1 (Week 1): =LTG due to estimated LOS  Skilled Therapeutic Interventions/Progress Updates:    Pt expressing frustration with weekend schedule of therapies and being up since 4 am - education provided on how therapy schedule works on [employer] and requirements of program. Pt verbalized understanding but still frustrated and continues with frustration with being scheduled Monday morning also. Discussed throughout session goals and plan for d/c and will discuss with team about moving up d/c to Monday morning and eliminate therapies on Monday (grad day Sunday). Focused on functional gait on unit, DGI assessment, Nustep for functional strengthening and endurance, and issued extensive HEP to address higher level balance and strengthening exercises. See DGI results below and discussed functional balance upon d/c. Nustep on level 4 x 10 min for general endurance and strengthening. Reviewed HEP with patient (packet of exercises given) including tandem stance, single limb balance, lateral sidestepping, retro gait, heel/toe walking, heel/toe raises, mini squats, standing hip abduction and hamstring curls, standing heel/toe raises, and modified yoga poses in quadruped. Recommended for pt to review independently and let next therapist know if any further questions. Pt mod I for basic transfer and mobility in the room without AD and made independent in room.   Therapy Documentation Precautions:  Precautions Precautions: Fall Restrictions Weight Bearing Restrictions: Yes   Pain:  Denies pain.     Balance: Standardized Balance Assessment Standardized Balance Assessment: Dynamic Gait Index Dynamic Gait Index Level Surface: Normal Change in Gait Speed:  Normal Gait with Horizontal Head Turns: Normal Gait with Vertical Head Turns: Normal Gait and Pivot Turn: Mild Impairment Step Over Obstacle: Normal Step Around Obstacles: Normal Steps: Normal Total Score: 23   See Function Navigator for Current Functional Status.   Therapy/Group: Individual Therapy  Canary Brim Ivory Broad, PT, DPT  10/02/2017, 9:08 AM

## 2017-10-03 ENCOUNTER — Inpatient Hospital Stay (HOSPITAL_COMMUNITY): Payer: Self-pay

## 2017-10-03 ENCOUNTER — Inpatient Hospital Stay (HOSPITAL_COMMUNITY): Payer: Self-pay | Admitting: Physical Therapy

## 2017-10-03 NOTE — Progress Notes (Addendum)
Occupational Therapy Discharge Summary  Patient Details  Name: Caitlin [name] MRN: [id number] Date of Birth: 02/02/1966  Today's Date: 10/03/2017 OT Individual Time: 0700-0757 AND OT Individual Time Calculation (min): 57 min    Skilled OT Treatment Session:  Pt completes bathing and dressing at overall MOD I level gathering all needed items and sequencing bathing body parts without cues. Pt stands to groom at sink with MOD I level. Pt demo good safety awareness washing, drying and dressing BLE in seated position to decrease fall risk. Educated pt on wearing grip socks or regular socks with shoes to increase traction on slippery floor and decrease fall risk. Pt completes grocery store activity recalling/locating 5/5 items with 1 question cue. Pt adds up prices and checks math with calculator (self directed strategy). Pt able to identify list writing as strategy to tell sister grocery items she may need at home. Pt makes correct change with money without VC. Pt ambulates back to room at MOD I level.   Session 2: Pt missed 45 min of skilled OT services 2/2 fatigue/pt refusal despite encouragement to participate.    Patient has met 12 of 12 long term goals due to improved activity tolerance, improved balance and postural control.  Patient to discharge at overall Modified Independent level.  Patient's care partner is independent to provide the necessary supervision assistance with community mobility/IADLs at discharge.     Recommendation:  Patient will benefit from ongoing skilled OT services in home health setting to continue to advance functional skills in the area of iADL/transportation.  Equipment: shower chair  Reasons for discharge: treatment goals met and discharge from hospital  Patient/family agrees with progress made and goals achieved: Yes  OT Discharge Precautions/Restrictions  Precautions Precautions: Fall Restrictions Weight Bearing Restrictions: No General Chart Reviewed:  Yes Vital Signs   Pain Pain Assessment Pain Assessment: No/denies pain ADL   Vision Baseline Vision/History: No visual deficits Patient Visual Report: No change from baseline Perception  Perception: Within Functional Limits Praxis Praxis: Intact Cognition Overall Cognitive Status: Within Functional Limits for tasks assessed Arousal/Alertness: Awake/alert Orientation Level: Oriented X4 Attention: Selective Selective Attention: Appears intact Memory: Appears intact Awareness: Appears intact Problem Solving: Appears intact Safety/Judgment: Appears intact Sensation Sensation Light Touch: Appears Intact Stereognosis: Appears Intact Hot/Cold: Appears Intact Proprioception: Appears Intact Coordination Gross Motor Movements are Fluid and Coordinated: Yes Fine Motor Movements are Fluid and Coordinated: Yes Motor  Motor Motor: Within Functional Limits Mobility  Bed Mobility Supine to Sit: 6: Modified independent (Device/Increase time) Sit to Supine: 6: Modified independent (Device/Increase time) Transfers Transfers: Sit to Stand;Stand to Sit Sit to Stand: 6: Modified independent (Device/Increase time) Stand to Sit: 6: Modified independent (Device/Increase time)  Trunk/Postural Assessment  Cervical Assessment Cervical Assessment: Within Functional Limits Thoracic Assessment Thoracic Assessment: Within Functional Limits Lumbar Assessment Lumbar Assessment: Within Functional Limits Postural Control Postural Control: Within Functional Limits  Balance Balance Balance Assessed: Yes Dynamic Standing Balance Dynamic Standing - Level of Assistance: 6: Modified independent (Device/Increase time) Dynamic Standing - Comments: bathing/dressing Extremity/Trunk Assessment RUE Assessment RUE Assessment: Within Functional Limits LUE Assessment LUE Assessment: Within Functional Limits   See Function Navigator for Current Functional Status.  Lowella Dell Claudy Abdallah 10/03/2017,  7:12 AM

## 2017-10-03 NOTE — Progress Notes (Signed)
Physical Therapy Discharge Summary  Patient Details  Name: Caitlin [name] MRN: [id number] Date of Birth: March 09, 1966  Today's Date: 10/03/2017 PT Individual Time: 7902-4097 and 1300-1330 PT Individual Time Calculation (min): 60 min and 30 min (total 90 min)    Patient has met 7 of 7 long term goals due to improved activity tolerance, improved balance, improved postural control, increased strength, ability to compensate for deficits and improved coordination.  Patient to discharge at an ambulatory level Modified Independent.   Patient's caregiver is independent to provide the necessary supervision for community mobility at d/c.  Reasons goals not met: All goals met  Recommendation:   Patient does not require additional therapy following d/c at this time. Pt has been provided with HEP for balance, stretching and general strengthening.   Equipment: No equipment provided  Reasons for discharge: treatment goals met and discharge from hospital  Patient/family agrees with progress made and goals achieved: Yes  PT Discharge Precautions/Restrictions Precautions Precautions: Fall Restrictions Weight Bearing Restrictions: No Vital Signs Oxygen Therapy SpO2: 97 % O2 Device: Not Delivered Pain Pain Assessment Pain Assessment: No/denies pain Vision/Perception  Perception Perception: Within Functional Limits Praxis Praxis: Intact  Cognition Overall Cognitive Status: Within Functional Limits for tasks assessed Arousal/Alertness: Awake/alert Orientation Level: Oriented X4 Attention: Selective Selective Attention: Appears intact Memory: Appears intact Awareness: Appears intact Problem Solving: Appears intact Safety/Judgment: Appears intact Sensation Sensation Light Touch: Appears Intact Stereognosis: Appears Intact Hot/Cold: Appears Intact Proprioception: Appears Intact Coordination Gross Motor Movements are Fluid and Coordinated: Yes Fine Motor Movements are Fluid and  Coordinated: Yes Motor  Motor Motor: Within Functional Limits  Mobility Bed Mobility Supine to Sit: 6: Modified independent (Device/Increase time) Sit to Supine: 6: Modified independent (Device/Increase time) Transfers Sit to Stand: 6: Modified independent (Device/Increase time) Stand to Sit: 6: Modified independent (Device/Increase time) Locomotion  Ambulation Ambulation: Yes Ambulation/Gait Assistance: 6: Modified independent (Device/Increase time) Ambulation Distance (Feet): 300 Feet Assistive device: None Gait Gait: Yes Gait Pattern: Within Functional Limits Gait Pattern: (occasional scissoring) Gait velocity: 4.28 ft/sec Stairs / Additional Locomotion Stairs: Yes Stairs Assistance: 6: Modified independent (Device/Increase time) Stair Management Technique: Two rails;Alternating pattern;Forwards Number of Stairs: 12 Height of Stairs: 6 Ramp: 6: Modified independent (Device) Curb: 6: Modified independent (Device/increase time) Wheelchair Mobility Wheelchair Mobility: No  Trunk/Postural Assessment  Cervical Assessment Cervical Assessment: Within Functional Limits Thoracic Assessment Thoracic Assessment: Within Functional Limits Lumbar Assessment Lumbar Assessment: Within Functional Limits Postural Control Postural Control: Within Functional Limits  Balance Balance Balance Assessed: Yes Standardized Balance Assessment Standardized Balance Assessment: Berg Balance Test Berg Balance Test Sit to Stand: Able to stand without using hands and stabilize independently Standing Unsupported: Able to stand safely 2 minutes Sitting with Back Unsupported but Feet Supported on Floor or Stool: Able to sit safely and securely 2 minutes Stand to Sit: Sits safely with minimal use of hands Transfers: Able to transfer safely, minor use of hands Standing Unsupported with Eyes Closed: Able to stand 10 seconds safely Standing Ubsupported with Feet Together: Able to place feet together  independently and stand 1 minute safely From Standing, Reach Forward with Outstretched Arm: Can reach confidently >25 cm (10") From Standing Position, Pick up Object from Floor: Able to pick up shoe safely and easily From Standing Position, Turn to Look Behind Over each Shoulder: Looks behind from both sides and weight shifts well Turn 360 Degrees: Able to turn 360 degrees safely in 4 seconds or less Standing Unsupported, Alternately Place Feet on Step/Stool: Able to  stand independently and complete 8 steps >20 seconds Standing Unsupported, One Foot in Front: Able to place foot tandem independently and hold 30 seconds Standing on One Leg: Able to lift leg independently and hold 5-10 seconds Total Score: 54 Static Sitting Balance Static Sitting - Balance Support: No upper extremity supported Static Sitting - Level of Assistance: 7: Independent Dynamic Sitting Balance Dynamic Sitting - Balance Support: No upper extremity supported Dynamic Sitting - Level of Assistance: 7: Independent Static Standing Balance Static Standing - Balance Support: No upper extremity supported Static Standing - Level of Assistance: 7: Independent Dynamic Standing Balance Dynamic Standing - Balance Support: No upper extremity supported Dynamic Standing - Level of Assistance: 6: Modified independent (Device/Increase time) Dynamic Standing - Balance Activities: Forward lean/weight shifting;Reaching for objects;Reaching across midline;Lateral lean/weight shifting Dynamic Standing - Comments: bathing/dressing Extremity Assessment  RUE Assessment RUE Assessment: Within Functional Limits LUE Assessment LUE Assessment: Within Functional Limits RLE Assessment RLE Assessment: Within Functional Limits(4+/5 throughout) LLE Assessment LLE Assessment: Within Functional Limits(4+/5 throughout)  Skilled Therapeutic Intervention: Tx 1: Pt received seated in bed, denies pain and agreeable to treatment. Reviewed HEP while pt  received breathing treatment. Assessed mobility and balance outcome measures as described above with modI overall and scores indicating low risk for falls. Pt performed nustep x10 min level 5 with BUE/BLE for strengthening and endurance. Engaged in dynamic balance activity while shooting basketball from numbered targets for cognitive dual task and direction changes with head turns; modI overall. Returned to room with gait modI; remained seated in bed at end of session, all needs in reach.   Tx 2: Pt received seated in bed, denies pain and agreeable to treatment. Gait throughout hospital including gift shop with modI including head turns, direction changes, cognitive distractions. Performed dynamic standing balance on biodex catch game with level platform, progressed to unlocked platform level 1 (most difficult) with UE support, progressed to no UE support. Returned to room modI ambulation. Pt with no further questions or concerns regarding d/c at this time. Remained seated in bed, all needs in reach.    See Function Navigator for Current Functional Status.  Benjiman Core Tygielski 10/03/2017, 10:12 AM

## 2017-10-03 NOTE — Plan of Care (Signed)
  Progressing RH BOWEL ELIMINATION RH STG MANAGE BOWEL WITH ASSISTANCE Description STG Manage Bowel with Assistance. 10/03/2017 1138 - Progressing by Ginette Pitman, RN RH STG MANAGE BOWEL W/MEDICATION W/ASSISTANCE Description STG Manage Bowel with Medication with Assistance. 10/03/2017 1138 - Progressing by Ginette Pitman, RN RH STG MANAGE BOWEL W/EQUIPMENT W/ASSISTANCE Description STG Manage Bowel With Equipment With Assistance 10/03/2017 1138 - Progressing by Ginette Pitman, RN RH OTHER STG BOWEL ELIMINATION GOALS W/ASSIST Description Other STG Bowel Elimination Goals With Assistance. 10/03/2017 1138 - Progressing by Ginette Pitman, RN

## 2017-10-03 NOTE — Progress Notes (Signed)
[facility]     PROGRESS NOTE  Subjective/Complaints:  Patient seen lying in bed this morning. She states she did not sleep well overnight because she is eager to go home tomorrow.  ROS: Denies nausea, vomiting, diarrhea, shortness of breath or chest pain   Objective: Vital Signs: Blood pressure 103/74, pulse 84, temperature 98 F (36.7 C), temperature source Oral, resp. rate 18, height 5\' 9"  (1.753 m), weight 55.8 kg (123 lb), last menstrual period 09/09/2014, SpO2 100 %. No results found. No results for input(s): WBC, HGB, HCT, PLT in the last 72 hours. No results for input(s): NA, K, CL, GLUCOSE, BUN, CREATININE, CALCIUM in the last 72 hours.  Invalid input(s): CO CBG (last 3)  No results for input(s): GLUCAP in the last 72 hours.  Wt Readings from Last 3 Encounters:  10/03/17 55.8 kg (123 lb)  09/28/17 54.6 kg (120 lb 4.8 oz)  09/07/17 59 kg (130 lb)    Physical Exam:  BP 103/74 (BP Location: Right Arm)   Pulse 84   Temp 98 F (36.7 C) (Oral)   Resp 18   Ht 5\' 9"  (1.753 m)   Wt 55.8 kg (123 lb)   LMP 09/09/2014 (Approximate)   SpO2 100%   BMI 18.16 kg/m  Constitutional: She appears well-developed and well-nourished. No distress.  HENT: Normocephalic and atraumatic.  Eyes: EOM are normal.  No discharge.  Neck: Dry dressing on neck Cardiovascular: RRR. No JVD . Respiratory:CTA Bilaterally. Normal effort   GI: Bowel sounds are normal. She exhibits no distension.  Musculoskeletal: She exhibits no edema or tenderness.  Neurological: She is alert and oriented to person, place, and time.  Motor: 4+/5 grossly throughout (unchanged) Skin: Skin is warm and dry.  Psychiatric: She has a normal mood and affect. Her behavior is normal. Judgment and thought content normal.   Assessment/Plan: 1. Functional deficits secondary to debility which require 3+ hours per day of interdisciplinary therapy in a comprehensive inpatient rehab  setting. Physiatrist is providing close team supervision and 24 hour management of active medical problems listed below. Physiatrist and rehab team continue to assess barriers to discharge/monitor patient progress toward functional and medical goals.  Function:  Bathing Bathing position Bathing activity did not occur: Refused Position: Wheelchair/chair at sink  Bathing parts Body parts bathed by patient: Right arm, Left arm, Chest, Abdomen, Front perineal area, Buttocks, Right upper leg, Left upper leg, Right lower leg, Left lower leg, Back    Bathing assist Assist Level: More than reasonable time      Upper Body Dressing/Undressing Upper body dressing   What is the patient wearing?: Pull over shirt/dress     Pull over shirt/dress - Perfomed by patient: Thread/unthread right sleeve, Thread/unthread left sleeve, Put head through opening, Pull shirt over trunk          Upper body assist Assist Level: Set up, Supervision or verbal cues   Set up : To obtain clothing/put away  Lower Body Dressing/Undressing Lower body dressing   What is the patient wearing?: Underwear, Pants, Socks, Shoes Underwear - Performed by patient: Thread/unthread right underwear leg, Thread/unthread left underwear leg, Pull underwear up/down   Pants- Performed by patient: Thread/unthread right pants leg, Thread/unthread left pants leg, Pull pants up/down   Non-skid slipper socks- Performed by patient: Don/doff right sock, Don/doff left sock   Socks - Performed by patient: Don/doff right sock, Don/doff left sock   Shoes - Performed by patient: Don/doff right shoe, Don/doff left  shoe, Fasten right, Fasten left            Lower body assist Assist for lower body dressing: More than reasonable time      Toileting Toileting   Toileting steps completed by patient: Adjust clothing prior to toileting, Performs perineal hygiene, Adjust clothing after toileting   Toileting Assistive Devices: Grab bar or  rail  Toileting assist Assist level: More than reasonable time   Transfers Chair/bed transfer   Chair/bed transfer method: Ambulatory Chair/bed transfer assist level: No Help, no cues, assistive device, takes more than a reasonable amount of time Chair/bed transfer assistive device: Armrests     Locomotion Ambulation     Max distance: 200' Assist level: Supervision or verbal cues   Wheelchair Wheelchair activity did not occur: N/A        Cognition Comprehension Comprehension assist level: Follows complex conversation/direction with no assist  Expression Expression assist level: Expresses complex ideas: With no assist  Social Interaction Social Interaction assist level: Interacts appropriately with others - No medications needed.  Problem Solving Problem solving assist level: Solves complex problems: Recognizes & self-corrects  Memory Memory assist level: Complete Independence: No helper    Medical Problem List and Plan: 1.  Generalized weakness and poor activity tolerance secondary to debility.   -continue CIR.   Plan for DC tomorrow, discussed with therapy 2.  DVT Prophylaxis/Anticoagulation: Pharmaceutical: Lovenox 3. Pain Management: N/A 4. Mood: LCSW to follow for evaluation and support.  5. Neuropsych: This patient is capable of making decisions on her own behalf. 6. Skin/Wound Care: routine pressure relief measures.  7. Fluids/Electrolytes/Nutrition:  Monitor I/Os   BMP within acceptable range on 11/14 9. AECOPD: continue Pulmicort bid a Brovana bid. Encourage IS.   Continue steroid taper until 11/23.  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 Continue to hold lasix, lisinopril and metoprolol due to relative hypotension.  11. Hemophilus PNA: Completed 14 day course antibiotic regimen. Continue robitussin tid for now.  12. Steroid induced hyperglycemia: Resolved   Monitor with increased mobility 13. Hypoalbuminemia   Supplement initiated on  11/14 14. ABLA   Hb 11.2 on 11/14   Cont to monitor  LOS (Days) 5 A FACE TO FACE EVALUATION WAS PERFORMED  Ankit Lorie Phenix 10/03/2017 8:18 AM

## 2017-10-04 ENCOUNTER — Inpatient Hospital Stay (HOSPITAL_COMMUNITY): Payer: Self-pay | Admitting: Occupational Therapy

## 2017-10-04 ENCOUNTER — Inpatient Hospital Stay (HOSPITAL_COMMUNITY): Payer: Self-pay

## 2017-10-04 ENCOUNTER — Inpatient Hospital Stay (HOSPITAL_COMMUNITY): Payer: Self-pay | Admitting: Physical Therapy

## 2017-10-04 MED ORDER — THIAMINE HCL 100 MG PO TABS: 100.0000 mg | ORAL_TABLET | Freq: Every day | ORAL | 0 refills | Status: DC

## 2017-10-04 MED ORDER — PREDNISONE 5 MG PO TABS: ORAL_TABLET | ORAL | 0 refills | Status: DC

## 2017-10-04 MED ORDER — NICOTINE 7 MG/24HR TD PT24
7.0000 mg | MEDICATED_PATCH | Freq: Every day | TRANSDERMAL | 0 refills | Status: DC
Start: 2017-10-04 — End: 2018-05-27

## 2017-10-04 MED ORDER — GUAIFENESIN 100 MG/5ML PO SOLN: 10.0000 mL | Freq: Four times a day (QID) | ORAL | 0 refills | Status: DC | PRN

## 2017-10-04 MED ORDER — PANTOPRAZOLE SODIUM 40 MG PO TBEC: 40.0000 mg | DELAYED_RELEASE_TABLET | Freq: Every day | ORAL | 0 refills | Status: DC

## 2017-10-04 MED ORDER — ARFORMOTEROL TARTRATE 15 MCG/2ML IN NEBU
15.0000 ug | INHALATION_SOLUTION | Freq: Two times a day (BID) | RESPIRATORY_TRACT | 0 refills | Status: DC
Start: 2017-10-04 — End: 2018-05-27

## 2017-10-04 MED ORDER — BUDESONIDE 0.5 MG/2ML IN SUSP: 0.5000 mg | Freq: Two times a day (BID) | RESPIRATORY_TRACT | 0 refills | Status: DC

## 2017-10-04 NOTE — Progress Notes (Signed)
Received a call from pharmacy that Caitlin [name] is over the [facility] amount. Patient has not picked up her nebulizer's yet. Changed formoterol and budesonide to Symbicort 80/4.5 use  2 puffs bid. Attempted to call patient regarding change X 2 without success. Called pharmacy back and they'll instruct patient on change when she picks up her medications.

## 2017-10-04 NOTE — Progress Notes (Signed)
Social Work  Discharge Note  The overall goal for the admission was met for:   Discharge location: Yes - return home with sister  Length of Stay: Yes - 6 days  Discharge activity level: Yes - modified independent  Home/community participation: Yes  Services provided included: MD, RD, PT, OT, SLP, RN, TR, Pharmacy and SW  Financial Services: Medicaid pending at time of d/c  Follow-up services arranged: No follow up therapies as pt does not have a qualifying diagnosis under Medicaid for services.  Tub seat with back ordered via [place] (or additional information):  Patient/Family verbalized understanding of follow-up arrangements: NA  Individual responsible for coordination of the follow-up plan: NA  Confirmed correct DME delivered: Deseray Daponte 10/04/2017    Teyla Skidgel

## 2017-10-04 NOTE — Discharge Instructions (Signed)
Inpatient Rehab Discharge Instructions  Caitlin [name] Discharge date and time:  10/04/17  Activities/Precautions/ Functional Status: Activity: activity as tolerated Diet: low fat, low cholesterol diet Wound Care: keep wound clean and dry   Functional status:  ___ No restrictions     ___ Walk up steps independently ___ 24/7 supervision/assistance   ___ Walk up steps with assistance _X__ Intermittent supervision/assistance  _X__ Bathe/dress independently ___ Walk with walker     ___ Bathe/dress with assistance ___ Walk Independently    ___ Shower independently ___ Walk with assistance    ___ Shower with assistance _X__ No alcohol     ___ Return to work/school ________   Special Instructions:    My questions have been answered and I understand these instructions. I will adhere to these goals and the provided educational materials after my discharge from the hospital.  Patient/Caregiver Signature _______________________________ Date __________  Clinician Signature _______________________________________ Date __________  Please bring this form and your medication list with you to all your follow-up doctor's appointments.

## 2017-10-04 NOTE — Patient Care Conference (Signed)
Inpatient RehabilitationTeam Conference and Plan of Care Update Date: 10/04/2017   Time: 3:19 PM    Patient Name: Caitlin [name]      Medical Record Number: [id number]  Date of Birth: Oct 14, 1966 Sex: Female         Room/Bed: 4W02C/4W02C-01 Payor Info: Payor: MEDICAID POTENTIAL / Plan: MEDICAID POTENTIAL / Product Type: *No Product type* /    Admitting Diagnosis: FTS Debility  Admit Date/Time:  09/28/2017  4:45 PM Admission Comments: No comment available   Primary Diagnosis:  Physical debility Principal Problem: Physical debility  Patient Active Problem List   Diagnosis Date Noted  . Chronic obstructive pulmonary disease ([place])   . Poor tolerance for activity   . Hypoalbuminemia due to protein-calorie malnutrition ([place])   . Acute blood loss anemia   . Physical debility 09/28/2017  . Hypoxemia   . Takatsuki syndrome   . Pneumonia due to Haemophilus influenzae ([place])   . Tracheostomy status ([place])   . Delirium   . Chronic combined systolic and diastolic congestive heart failure ([place])   . Tobacco abuse   . Steroid-induced hyperglycemia   . Hypokalemia   . Leukocytosis   . SVT (supraventricular tachycardia) ([place])   . Acute respiratory failure with hypoxia ([place])   . Hypotension   . At risk for stress ulcer 09/12/2017  . AKI (acute kidney injury) ([place]) 09/11/2017  . Encounter for care related to feeding tube 09/11/2017  . No contraindication to deep vein thrombosis (DVT) prophylaxis 09/11/2017  . Shortness of breath 09/09/2017  . Acute on chronic respiratory failure ([place]) 09/08/2017  . COPD exacerbation ([place]) 07/15/2017  . Tobacco use 07/15/2017  . Acute combined systolic and diastolic heart failure [facility])     Expected Discharge Date: Expected Discharge Date: 10/04/17  Team Members Present: Physician leading conference: Dr. Alger Simons Social Worker Present: Lennart Pall, LCSW Nurse Present: Elliot Cousin, RN PT Present: Kem Parkinson, PT OT Present: Roanna Epley,  COTA     Current Status/Progress Goal Weekly Team Focus  Medical   debility after COPD excacerbation and respiratory failure.  Patient's history also positive for congestive heart failure   Wean steroids to off  Stabilized medically for discharge   Bowel/Bladder   Continent bowel and bladder  Remain continent; no retention, no s/s infection  Monitor   Swallow/Nutrition/ Hydration             ADL's   mod I overall; supervision for tub transfer  mod I overall; tub transfer-supervision  discharge home   Mobility   modI overall, S stairs and community gait  modI overall, S stairs/community gait  pt education, d/c home   Communication             Safety/Cognition/ Behavioral Observations  No unsafe behaviors, calls appropriately for assist, anticipates needs  Increased safety awareness  Monitor, continue safety education   Pain   Denies any pain/discomfort  Managed at goal 2/10  Monitor   Skin   Trach site in healing process; no drainage, no s/s infection  Remain free of s/s infection, continues to close, healing process  Continue ed re: skin care, S/S infection, monitor temp    Rehab Goals Patient on target to meet rehab goals: Yes *See Care Plan and progress notes for long and short-term goals.     Barriers to Discharge  Current Status/Progress Possible Resolutions Date Resolved   Physician              None  Nursing                  PT                    OT                  SLP                SW                Discharge Planning/Teaching Needs:  Home with sister who can provide 24/7 supervision/ assist.  NA - independent goals.   Team Discussion:  Pt has made excellent gains and ready for d/c today at mod ind - supervision level  Revisions to Treatment Plan:  None    Continued Need for Acute Rehabilitation Level of Care: The patient requires daily medical management by a physician with specialized training in physical medicine and rehabilitation for the  following conditions: Daily direction of a multidisciplinary physical rehabilitation program to ensure safe treatment while eliciting the highest outcome that is of practical value to the patient.: Yes Daily medical management of patient stability for increased activity during participation in an intensive rehabilitation regime.: Yes Daily analysis of laboratory values and/or radiology reports with any subsequent need for medication adjustment of medical intervention for : Pulmonary problems  Joani Cosma 10/04/2017, 3:19 PM

## 2017-10-04 NOTE — Progress Notes (Signed)
[name]     PROGRESS NOTE  Subjective/Complaints:  Had a great night and weekend.  Excited about going home.  No new problems today  ROS: pt denies nausea, vomiting, diarrhea, cough, shortness of breath or chest pain   Objective: Vital Signs: Blood pressure 104/68, pulse 79, temperature 97.7 F (36.5 C), temperature source Oral, resp. rate 18, height 5\' 9"  (1.753 m), weight 56.7 kg (125 lb 1.6 oz), last menstrual period 09/09/2014, SpO2 99 %. No results found. No results for input(s): WBC, HGB, HCT, PLT in the last 72 hours. No results for input(s): NA, K, CL, GLUCOSE, BUN, CREATININE, CALCIUM in the last 72 hours.  Invalid input(s): CO CBG (last 3)  No results for input(s): GLUCAP in the last 72 hours.  Wt Readings from Last 3 Encounters:  10/04/17 56.7 kg (125 lb 1.6 oz)  09/28/17 54.6 kg (120 lb 4.8 oz)  09/07/17 59 kg (130 lb)    Physical Exam:  BP 104/68 (BP Location: Right Arm)   Pulse 79   Temp 97.7 F (36.5 C) (Oral)   Resp 18   Ht 5\' 9"  (1.753 m)   Wt 56.7 kg (125 lb 1.6 oz)   LMP 09/09/2014 (Approximate)   SpO2 99%   BMI 18.47 kg/m  Constitutional: She appears well-developed and well-nourished. No distress.  HENT: Normocephalic and atraumatic.  Eyes: EOM are normal.  No discharge.  Neck: Dry dressing on neck Cardiovascular: RRR without murmur. No JVD . Respiratory: CTA Bilaterally without wheezes or rales. Normal effort  GI: Bowel sounds are normal. She exhibits no distension.  Musculoskeletal: She exhibits no edema or tenderness.  Neurological: She is alert and oriented to person, place, and time.  Motor: 4+/5 grossly throughout  --stable Skin: Skin is warm and dry.  Psychiatric: She has a normal mood and affect. Her behavior is normal. Judgment and thought content normal.   Assessment/Plan: 1. Functional deficits secondary to debility which require 3+ hours per day of interdisciplinary therapy in a comprehensive  inpatient rehab setting. Physiatrist is providing close team supervision and 24 hour management of active medical problems listed below. Physiatrist and rehab team continue to assess barriers to discharge/monitor patient progress toward functional and medical goals.  Function:  Bathing Bathing position Bathing activity did not occur: Refused Position: Wheelchair/chair at sink  Bathing parts Body parts bathed by patient: Right arm, Left arm, Chest, Abdomen, Front perineal area, Buttocks, Right upper leg, Left upper leg, Right lower leg, Left lower leg, Back    Bathing assist Assist Level: More than reasonable time      Upper Body Dressing/Undressing Upper body dressing   What is the patient wearing?: Pull over shirt/dress     Pull over shirt/dress - Perfomed by patient: Thread/unthread right sleeve, Thread/unthread left sleeve, Put head through opening, Pull shirt over trunk          Upper body assist Assist Level: Set up, Supervision or verbal cues   Set up : To obtain clothing/put away  Lower Body Dressing/Undressing Lower body dressing   What is the patient wearing?: Underwear, Pants, Socks, Shoes Underwear - Performed by patient: Thread/unthread right underwear leg, Thread/unthread left underwear leg, Pull underwear up/down   Pants- Performed by patient: Thread/unthread right pants leg, Thread/unthread left pants leg, Pull pants up/down   Non-skid slipper socks- Performed by patient: Don/doff right sock, Don/doff left sock   Socks - Performed by patient: Don/doff right sock, Don/doff left sock   Shoes -  Performed by patient: Don/doff right shoe, Don/doff left shoe, Fasten right, Fasten left            Lower body assist Assist for lower body dressing: More than reasonable time      Toileting Toileting   Toileting steps completed by patient: Adjust clothing prior to toileting, Performs perineal hygiene, Adjust clothing after toileting   Toileting Assistive Devices:  Grab bar or rail  Toileting assist Assist level: No help/no cues   Transfers Chair/bed transfer   Chair/bed transfer method: Ambulatory Chair/bed transfer assist level: No Help, no cues, assistive device, takes more than a reasonable amount of time Chair/bed transfer assistive device: Armrests     Locomotion Ambulation     Max distance: 200' Assist level: No help, No cues, assistive device, takes more than a reasonable amount of time   Wheelchair Wheelchair activity did not occur: N/A        Cognition Comprehension Comprehension assist level: Follows complex conversation/direction with no assist  Expression Expression assist level: Expresses complex ideas: With no assist  Social Interaction Social Interaction assist level: Interacts appropriately with others - No medications needed.  Problem Solving Problem solving assist level: Solves complex problems: With extra time  Memory Memory assist level: Complete Independence: No helper    Medical Problem List and Plan: 1.  Generalized weakness and poor activity tolerance secondary to debility.   -continue CIR.   DC home today.  Needs follow-up with primary as well as cardiology 2.  DVT Prophylaxis/Anticoagulation: Pharmaceutical: Lovenox 3. Pain Management: N/A 4. Mood: LCSW to follow for evaluation and support.  5. Neuropsych: This patient is capable of making decisions on her own behalf. 6. Skin/Wound Care: routine pressure relief measures.  7. Fluids/Electrolytes/Nutrition:  Monitor I/Os   BMP within acceptable range on 11/14 9. AECOPD: continue Pulmicort bid a Brovana bid. Encourage IS.   Continue steroid taper until 11/23.  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 Continue to hold lasix, lisinopril and metoprolol due to relative hypotension.  11. Hemophilus PNA: Completed 14 day course antibiotic regimen. Continue robitussin tid for now.  12. Steroid induced hyperglycemia: Resolved   Monitor with  increased mobility 13. Hypoalbuminemia   -Good p.o. intake 14. ABLA   Hb 11.2 on 11/14   Cont to monitor  LOS (Days) 6 A FACE TO FACE EVALUATION WAS PERFORMED  Caitlin [name] T 10/04/2017 9:12 AM

## 2017-10-04 NOTE — Progress Notes (Signed)
Alert, cooperative throughout shift , anticipate discharge home , some anxiousness present with reassurance provided

## 2017-10-04 NOTE — Discharge Summary (Signed)
Physician Discharge Summary  Patient ID: Caitlin [name] MRN: [id number] DOB/AGE: 04/15/1966 51 y.o.  Admit date: 09/28/2017 Discharge date: 10/04/2017  Discharge Diagnoses:  Principal Problem:   Physical debility Active Problems:   COPD exacerbation ([place])   Chronic combined systolic and diastolic congestive heart failure (HCC)   Hypoalbuminemia due to protein-calorie malnutrition (HCC)   Acute blood loss anemia   Chronic obstructive pulmonary disease (HCC)   Poor tolerance for activity   Discharged Condition: stable  Significant Diagnostic Studies: N/A   Labs:  Basic Metabolic Panel: Recent Labs  Lab 09/28/17 0432 09/29/17 0621  NA 136 136  K 3.8 3.7  CL 108 106  CO2 24 24  GLUCOSE 98 98  BUN 20 18  CREATININE 0.60 0.59  CALCIUM 8.8* 9.1    CBC: Recent Labs  Lab 09/29/17 0621  WBC 7.8  NEUTROABS 3.2  HGB 11.2*  HCT 34.3*  MCV 87.3  PLT 416*    CBG: Recent Labs  Lab 09/27/17 1702 09/27/17 2019 09/28/17 0754 09/28/17 1204  GLUCAP 89 108* 95 94    Brief HPI:   Caitlin [name] a 51 y.o.femalewithwith history of CHF due to Takotsubo CM, tobacco abuse, COPD; who was admitted on 09/07/17 with SOB due to AECOPD.She was stared on bronchodilators and steroids but declined on 10/25 with increased WOB requiring intubation. She had difficulty with vent wean due to agitation and ultimately required tracheostomy prior to being weaned to ATC. Hospital course significant for Hemophilus tracheobronchitis/PNA treated with Augmentin, hypotension and delirium.  Repeat echo revealed EF at 55-60%. Trach downsized to CFS #6 and she was  decannulated prior to admission. Is tolerating regular diet without difficulty. Prednisone to be slowly tapered off per PCCM. She was noted to be deconditioned and CIR recommended by rehab team.     Hospital Course: Caitlin [name] was admitted to rehab 09/28/2017 for inpatient therapies to consist of PT and OT at least three hours five days a  week. Past admission physiatrist, therapy team and rehab RN have worked together to provide customized collaborative inpatient rehab. Blood pressures continued to be on soft therefore lasix, lisinopril and metoprolol were held during her stay. she is to follow up with cardiology for input on resumption on medications. Her respiratory status and endurance have improved and she is to continue steroid taper at discharge.  Hyperglycemia has resolved with decrease in steroids and renal status is stable.  She completed her antibiotic regimen on 11/14 and reactive leucocytosis has resolved. She has been educated on pulmonary hygiene and importance of tobacco cessation.  She has made steady progress and is modified independent at discharge. No follow up therapy recommended.    Rehab course: During patient's stay in rehab team conference was held to discussr patient's progress, set goals and discuss barriers to discharge. At admission, patient required min assist with basic self care tasks and mobility.  She has had improvement in activity tolerance, balance, postural control, as well as ability to compensate for deficits.  She is able to complete ADL tasks at modified independent level.  She is modified independent for transfers and is able to ambulate 300' without AD. She is able to climb 12 stairs at modified independent level.     Disposition: 01-Home or Self Care  Diet: Low fat/Low cholesterol/Low salt.   Special Instructions: 1. No smoking.    Discharge Medication List as of 10/04/2017 10:06 AM    START taking these medications   Details  guaiFENesin (ROBITUSSIN)  100 MG/5ML SOLN Take 10 mLs (200 mg total) every 6 (six) hours as needed by mouth for cough or to loosen phlegm., Starting Mon 10/04/2017, Normal    pantoprazole (PROTONIX) 40 MG tablet Take 1 tablet (40 mg total) daily by mouth., Starting Tue 10/05/2017, Normal    thiamine 100 MG tablet Take 1 tablet (100 mg total) daily by mouth.,  Starting Tue 10/05/2017, Normal      CONTINUE these medications which have CHANGED   Details  arformoterol (BROVANA) 15 MCG/2ML NEBU Take 2 mLs (15 mcg total) 2 (two) times daily by nebulization., Starting Mon 10/04/2017, Normal    budesonide (PULMICORT) 0.5 MG/2ML nebulizer solution Take 2 mLs (0.5 mg total) 2 (two) times daily by nebulization., Starting Mon 10/04/2017, Normal    nicotine (NICODERM CQ - DOSED IN MG/24 HR) 7 mg/24hr patch Place 1 patch (7 mg total) daily onto the skin., Starting Mon 10/04/2017, Normal    predniSONE (DELTASONE) 5 MG tablet One pill daily with breakfast till 11/23. Decrease to 1/2 pill daily starting 11/24 and use till gone., Normal      CONTINUE these medications which have NOT CHANGED   Details  albuterol (PROVENTIL HFA;VENTOLIN HFA) 108 (90 Base) MCG/ACT inhaler Inhale 2 puffs into the lungs every 4 (four) hours as needed for wheezing or shortness of breath., Starting Tue 08/17/2017, Print    vitamin B-12 (CYANOCOBALAMIN) 100 MCG tablet Take 100 mcg by mouth daily., Historical Med      STOP taking these medications     albuterol (PROVENTIL) (2.5 MG/3ML) 0.083% nebulizer solution      amoxicillin-clavulanate (AUGMENTIN) 875-125 MG tablet      ipratropium (ATROVENT) 0.02 % nebulizer solution      lisinopril (PRINIVIL,ZESTRIL) 10 MG tablet      metoprolol succinate (TOPROL-XL) 25 MG 24 hr tablet        Follow-up Information    Martinique, Peter M, MD. Call in 1 day(s).   Specialty:  Cardiology Why:  for follow up appointment Contact information: [address] [place] [address] [phone]        [name], MD Follow up on 10/22/2017.   Specialty:  Internal Medicine Why:  @ 2:15 pm Contact information: [place] [address] [phone]        Meredith Staggers, MD Follow up.   Specialty:  Physical Medicine and Rehabilitation Why:  as needed Contact information: [address] [place] [address] [phone]           Signed: [name] 10/04/2017, 3:21 PM

## 2017-10-19 ENCOUNTER — Encounter: Payer: Self-pay | Admitting: Physician Assistant

## 2017-10-19 ENCOUNTER — Ambulatory Visit (INDEPENDENT_AMBULATORY_CARE_PROVIDER_SITE_OTHER): Payer: Self-pay | Admitting: Physician Assistant

## 2017-10-19 VITALS — BP 121/75 | HR 83 | Ht 69.0 in | Wt 131.8 lb

## 2017-10-19 DIAGNOSIS — I5181 Takotsubo syndrome: Secondary | ICD-10-CM

## 2017-10-19 DIAGNOSIS — J449 Chronic obstructive pulmonary disease, unspecified: Secondary | ICD-10-CM

## 2017-10-19 DIAGNOSIS — Z720 Tobacco use: Secondary | ICD-10-CM

## 2017-10-19 NOTE — Progress Notes (Signed)
Cardiology Office Note    Date:  10/20/2017   ID:  Caitlin [name], DOB February 04, 1966, MRN [id number]  PCP:  [name], MD  Cardiologist:  Dr. Stanford Breed  Chief Complaint  Patient presents with  . Hospitalization Follow-up    seen for Dr. Stanford Breed.    History of Present Illness:  Caitlin [name] is a 51 y.o. female with PMH of COPD, tobacco abuse, systolic HF, and HTN.  She was admitted back in August 2018 with chest pain and shortness of breath, and subsequently ruled in for NSTEMI.  Cardiac catheterization revealing normal coronaries.  Echocardiogram consistent with Takotsubo cardiomyopathy.  He was placed on Toprol-XL and Cozaar.  She was also discharged on 20 mg daily of Lasix.  Since then, she has presented to the ED twice for shortness of breath and COPD exacerbation.  She was also admitted back in October with a COPD exacerbation as well.  During the most recent hospitalization, patient required intubation due to dyspnea.  She had a trach placement, this was decannulated prior to discharge to inpatient rehab.  Central line was also placed given hypotension as well. Her BP medications were discontinued. Hospital course was complicated by Haemophilus influenza tracheobronchitis and pneumonia.  Her Lasix 20 mg daily was discontinued. Echocardiogram obtained during this admission showed EF 55-60%.  Patient presents today for cardiology office visit.  She denies any lower extremity edema, orthopnea or PND.  She does not have any chest pain or shortness of breath since discharge.  She says she has quit smoking since recent intubation.  At this point, she is off of all beta-blocker, ACE inhibitor and diuretic.  She has been doing very well since discharge.  Her blood pressure is normal at 121/75.  On physical exam, she is euvolemic and does not need any diuretic at this time.  Her most likely cause for the takotsubo cardiomyopathy was due to COPD exacerbation.  I will have her see Dr.  Stanford Breed in 6 months, after which she can probably be seen on a more as-needed basis.   Past Medical History:  Diagnosis Date  . CHF (congestive heart failure) ([place])   . COPD (chronic obstructive pulmonary disease) ([place])   . NSTEMI (non-ST elevated myocardial infarction) [facility])     Past Surgical History:  Procedure Laterality Date  . LEFT HEART CATH AND CORONARY ANGIOGRAPHY N/A 07/15/2017   Procedure: LEFT HEART CATH AND CORONARY ANGIOGRAPHY;  Surgeon: Martinique, Peter M, MD;  Location: [facility];  Service: Cardiovascular;  Laterality: N/A;    Current Medications: Outpatient Medications Prior to Visit  Medication Sig Dispense Refill  . albuterol (PROVENTIL HFA;VENTOLIN HFA) 108 (90 Base) MCG/ACT inhaler Inhale 2 puffs into the lungs every 4 (four) hours as needed for wheezing or shortness of breath. 1 Inhaler 0  . arformoterol (BROVANA) 15 MCG/2ML NEBU Take 2 mLs (15 mcg total) 2 (two) times daily by nebulization. 120 mL 0  . budesonide (PULMICORT) 0.5 MG/2ML nebulizer solution Take 2 mLs (0.5 mg total) 2 (two) times daily by nebulization. 120 mL 0  . guaiFENesin (ROBITUSSIN) 100 MG/5ML SOLN Take 10 mLs (200 mg total) every 6 (six) hours as needed by mouth for cough or to loosen phlegm. 1200 mL 0  . nicotine (NICODERM CQ - DOSED IN MG/24 HR) 7 mg/24hr patch Place 1 patch (7 mg total) daily onto the skin. 28 patch 0  . pantoprazole (PROTONIX) 40 MG tablet Take 1 tablet (40 mg total) daily by mouth. [place]av[place]  tablet 0  . predniSONE (DELTASONE) 5 MG tablet One pill daily with breakfast till 11/23. Decrease to 1/2 pill daily starting 11/24 and use till gone. 8 tablet 0  . thiamine 100 MG tablet Take 1 tablet (100 mg total) daily by mouth. 30 tablet 0  . vitamin B-12 (CYANOCOBALAMIN) 100 MCG tablet Take 100 mcg by mouth daily.     No facility-administered medications prior to visit.      Allergies:   Patient has no known allergies.   Social History   Socioeconomic History  . Marital  status: Divorced    Spouse name: None  . Number of children: None  . Years of education: None  . Highest education level: None  Social Needs  . Financial resource strain: None  . Food insecurity - worry: None  . Food insecurity - inability: None  . Transportation needs - medical: None  . Transportation needs - non-medical: None  Occupational History  . Occupation: CNA  Tobacco Use  . Smoking status: Former Smoker    Packs/day: 1.00    Types: Cigarettes  . Smokeless tobacco: Never Used  . Tobacco comment: quit 07/12/17  Substance and Sexual Activity  . Alcohol use: Yes    Comment: occasional alcohol  . Drug use: Yes    Types: Marijuana    Comment: Occasional marijuana  . Sexual activity: None  Other Topics Concern  . None  Social History Narrative  . None     Family History:  The patient's family history includes Aneurysm in her father; Heart disease in her mother; Hypertension in her mother.   ROS:   Please see the history of present illness.    ROS All other systems reviewed and are negative.   PHYSICAL EXAM:   VS:  BP 121/75   Pulse 83   Ht 5\' 9"  (1.753 m)   Wt 131 lb 12.8 oz (59.8 kg)   LMP 09/09/2014 (Approximate)   BMI 19.46 kg/m    GEN: Well nourished, well developed, in no acute distress  HEENT: normal  Neck: no JVD, carotid bruits, or masses Cardiac: RRR; no murmurs, rubs, or gallops,no edema  Respiratory:  clear to auscultation bilaterally, normal work of breathing GI: soft, nontender, nondistended, + BS MS: no deformity or atrophy  Skin: warm and dry, no rash Neuro:  Alert and Oriented x 3, Strength and sensation are intact Psych: euthymic mood, full affect  Wt Readings from Last 3 Encounters:  10/19/17 131 lb 12.8 oz (59.8 kg)  10/04/17 125 lb 1.6 oz (56.7 kg)  09/28/17 120 lb 4.8 oz (54.6 kg)      Studies/Labs Reviewed:   EKG:  EKG is not ordered today.    Recent Labs: 09/14/2017: B Natriuretic Peptide 201.1 09/20/2017: TSH  0.158 09/24/2017: Magnesium 1.9 09/29/2017: ALT 41; BUN 18; Creatinine, Ser 0.59; Hemoglobin 11.2; Platelets 416; Potassium 3.7; Sodium 136   Lipid Panel    Component Value Date/Time   CHOL 172 07/16/2017 0253   TRIG 100 09/20/2017 0427   HDL 52 07/16/2017 0253   CHOLHDL 3.3 07/16/2017 0253   VLDL 17 07/16/2017 0253   LDLCALC 103 (H) 07/16/2017 0253    Additional studies/ records that were reviewed today include:    Cath 07/15/2017 Conclusion     LV end diastolic pressure is severely elevated.   1. Normal coronary anatomy 2. Markedly elevated LVEDP  Plan: medical management of CHF      Echo 09/10/2017 LV EF: 55% -   60%  Study Conclusions  - Left ventricle: The cavity size was normal. Wall thickness was   normal. Systolic function was normal. The estimated ejection   fraction was in the range of 55% to 60%. Wall motion was normal;   there were no regional wall motion abnormalities. The study is   not technically sufficient to allow evaluation of LV diastolic   function. - Tricuspid valve: There was moderate regurgitation. - Pulmonary arteries: Systolic pressure was mildly increased. PA   peak pressure: 35 mm Hg (S). - Pericardium, extracardiac: A small pericardial effusion was   identified at the apex. There was no evidence of hemodynamic   compromise.  Impressions:  - EF is improved when compared to prior (45%)    ASSESSMENT:    1. Takotsubo cardiomyopathy   2. Tobacco abuse   3. Chronic obstructive pulmonary disease, unspecified COPD type ([place])      PLAN:  In order of problems listed above:  1. Takotsubo cardiomyopathy: EF improved to normal on recent Echo. Currently off all beta blocker, ACEI and diuretic. BP stable off antihypertensive medications. Remain euvolemic off diuretic. Followup in 6 month, if stable, likely can be followed on an as needed basis.  2. Tobacco abuse: Patient is determined to quit smoking after recent recurrent COPD  exacerbation and intubation. I congratulated her on this.   3. COPD: patient has quit smoking.     Medication Adjustments/Labs and Tests Ordered: Current medicines are reviewed at length with the patient today.  Concerns regarding medicines are outlined above.  Medication changes, Labs and Tests ordered today are listed in the Patient Instructions below. Patient Instructions  Medication Instructions:   No changes  Labwork:   none  Testing/Procedures:  none  Follow-Up:  In 6 months with Dr. Stanford Breed. We will send you a reminder letter when it is time to schedule this appointment.  If you need a refill on your cardiac medications before your next appointment, please call your pharmacy.     Hilbert Corrigan, [place]  10/20/2017 11:10 PM    [facility] [place], [place], [place]  [address] Phone: [phone]; Fax: [phone]

## 2017-10-19 NOTE — Patient Instructions (Signed)
Medication Instructions:   No changes  Labwork:   none  Testing/Procedures:  none  Follow-Up:  In 6 months with Dr. Stanford Breed. We will send you a reminder letter when it is time to schedule this appointment.  If you need a refill on your cardiac medications before your next appointment, please call your pharmacy.

## 2017-10-20 ENCOUNTER — Encounter: Payer: Self-pay | Admitting: Physician Assistant

## 2017-10-22 ENCOUNTER — Other Ambulatory Visit: Payer: Self-pay

## 2017-10-22 ENCOUNTER — Ambulatory Visit (INDEPENDENT_AMBULATORY_CARE_PROVIDER_SITE_OTHER): Payer: Self-pay | Admitting: Internal Medicine

## 2017-10-22 ENCOUNTER — Ambulatory Visit: Payer: Self-pay | Admitting: Pharmacist

## 2017-10-22 ENCOUNTER — Encounter: Payer: Self-pay | Admitting: Internal Medicine

## 2017-10-22 VITALS — BP 130/82 | HR 70 | Temp 97.9°F | Ht 69.0 in | Wt 134.6 lb

## 2017-10-22 DIAGNOSIS — F17211 Nicotine dependence, cigarettes, in remission: Secondary | ICD-10-CM

## 2017-10-22 DIAGNOSIS — J449 Chronic obstructive pulmonary disease, unspecified: Secondary | ICD-10-CM

## 2017-10-22 DIAGNOSIS — J441 Chronic obstructive pulmonary disease with (acute) exacerbation: Secondary | ICD-10-CM

## 2017-10-22 DIAGNOSIS — Z7951 Long term (current) use of inhaled steroids: Secondary | ICD-10-CM

## 2017-10-22 DIAGNOSIS — Z8619 Personal history of other infectious and parasitic diseases: Secondary | ICD-10-CM

## 2017-10-22 NOTE — Patient Instructions (Addendum)
It was a pleasure seeing you today. I'm very glad you are doing well.   Please continue using Symbicort inhaler as usual. When you finish it, start using Advair 1 puff twice a day.   Please call the internal medicine clinic if you run out of medications or if you are having a difficult time affording them. We can help you with this.   I will see you back in 3-6 months. You can return sooner than this if needed.   Happy Holidays!

## 2017-10-22 NOTE — Progress Notes (Signed)
   CC: COPD hospital follow up   HPI:  [name] is a 51 y.o. female with PMH as listed below who presents to clinic for COPD hospital follow up. Please see problem based assessment and plan for further details.   Past Medical History:  Diagnosis Date  . CHF (congestive heart failure) ([place])   . COPD (chronic obstructive pulmonary disease) ([place])   . NSTEMI (non-ST elevated myocardial infarction) ([place])    Review of Systems:   Review of Systems  Constitutional: Negative for chills, fever and weight loss.  HENT: Negative for congestion, sinus pain and sore throat.   Respiratory: Positive for cough. Negative for shortness of breath.   Cardiovascular: Negative for chest pain, palpitations, orthopnea and leg swelling.  Gastrointestinal: Negative for abdominal pain, constipation, diarrhea, heartburn, nausea and vomiting.  Genitourinary: Negative for dysuria.  Neurological: Negative for dizziness and headaches.     Physical Exam:  Vitals:   10/22/17 1411  BP: 130/82  Pulse: 70  Temp: 97.9 F (36.6 C)  TempSrc: Oral  SpO2: 100%  Weight: 134 lb 9.6 oz (61.1 kg)  Height: 5\' 9"  (1.753 m)   General: very pleasant female, thin, well-developed, in no acute distress  Cardiac: regular rate and rhythm, nl S1/S2, no murmurs, rubs or gallops  Pulm: CTAB, no wheezes or crackles, no increased work of breathing  Neuro: A&Ox3, able to move all 4 extremities with no focal deficits noted  Ext: warm and well perfused, no peripheral edema    Assessment & Plan:   See Encounters Tab for problem based charting.  Patient seen with Dr. [name] '

## 2017-10-22 NOTE — Assessment & Plan Note (Signed)
Patient presents for hospital follow-up. Admitted for presumed COPD exacerbation (no PFTs) complicated by H. Flu tracheobronchitis. Required intubation and trach collar. Completed CIR and was discharged on 11/29. Has been doing well since then. Completed steroid taper 2 days ago. No SOB or chest pain. Able to ambulate without difficulty. Lives alone and able to do house chores without assistance. Reports good appetite. Currently on Symbicort and compliant. Cardiac and lung exams are benign today. Will need PFTs in the future. She is currently unable to afford testing due to lack of insurance. Currently applying for Medicare.   - Will continue Symbicort for now. When she runs out of Symbicort will switch to Advair as this will be more affordable for patient. Advised patient to call clinic if runs out of medication or unable to afford it. Albuterol inhaler provided for PRN use.  - Congratulated patient on quitting smoking  - Recommended flu shot, unable to afford

## 2017-10-22 NOTE — Progress Notes (Signed)
[employer]

## 2017-10-26 NOTE — Progress Notes (Signed)
Internal Medicine Clinic Attending  I saw and evaluated the patient.  I personally confirmed the key portions of the history and exam documented by Dr. Santos-Sanchez and I reviewed pertinent patient test results.  The assessment, diagnosis, and plan were formulated together and I agree with the documentation in the resident's note. 

## 2017-10-28 ENCOUNTER — Encounter: Payer: Self-pay | Admitting: Pharmacist

## 2017-10-28 NOTE — Progress Notes (Signed)
Patient was approved for [employer] patient assistance program for free access to Advair.   Other inhalers available free through this program (in case needed in the future): Ventolin (albuterol) HFA Flovent (fluticasone) Incruse (umeclidinium) Anoro (umeclidinium/vilanterol) Trelegy (fluticasone/umeclidinium/vilanterol)

## 2017-12-09 ENCOUNTER — Other Ambulatory Visit: Payer: Self-pay | Admitting: General Practice

## 2017-12-09 ENCOUNTER — Other Ambulatory Visit: Payer: Self-pay | Admitting: Oncology

## 2017-12-09 DIAGNOSIS — J449 Chronic obstructive pulmonary disease, unspecified: Secondary | ICD-10-CM

## 2017-12-09 DIAGNOSIS — I5042 Chronic combined systolic (congestive) and diastolic (congestive) heart failure: Secondary | ICD-10-CM

## 2017-12-30 ENCOUNTER — Ambulatory Visit: Payer: Disability Insurance | Attending: General Practice

## 2017-12-30 DIAGNOSIS — E46 Unspecified protein-calorie malnutrition: Secondary | ICD-10-CM | POA: Diagnosis not present

## 2017-12-30 DIAGNOSIS — I5042 Chronic combined systolic (congestive) and diastolic (congestive) heart failure: Secondary | ICD-10-CM | POA: Diagnosis not present

## 2017-12-30 DIAGNOSIS — J449 Chronic obstructive pulmonary disease, unspecified: Secondary | ICD-10-CM | POA: Insufficient documentation

## 2017-12-30 DIAGNOSIS — R5381 Other malaise: Secondary | ICD-10-CM | POA: Insufficient documentation

## 2017-12-30 DIAGNOSIS — D649 Anemia, unspecified: Secondary | ICD-10-CM | POA: Insufficient documentation

## 2017-12-30 DIAGNOSIS — E8809 Other disorders of plasma-protein metabolism, not elsewhere classified: Secondary | ICD-10-CM | POA: Insufficient documentation

## 2018-01-12 ENCOUNTER — Encounter: Payer: Self-pay | Admitting: Internal Medicine

## 2018-02-04 ENCOUNTER — Other Ambulatory Visit: Payer: Self-pay

## 2018-02-04 ENCOUNTER — Emergency Department (HOSPITAL_COMMUNITY): Payer: Medicaid Other

## 2018-02-04 ENCOUNTER — Encounter (HOSPITAL_COMMUNITY): Payer: Self-pay | Admitting: Emergency Medicine

## 2018-02-04 ENCOUNTER — Emergency Department (HOSPITAL_COMMUNITY)
Admission: EM | Admit: 2018-02-04 | Discharge: 2018-02-04 | Disposition: A | Discharge status: Home / Self Care | Payer: Medicaid Other | Attending: Emergency Medicine | Admitting: Emergency Medicine

## 2018-02-04 DIAGNOSIS — Z87891 Personal history of nicotine dependence: Secondary | ICD-10-CM | POA: Insufficient documentation

## 2018-02-04 DIAGNOSIS — Z79899 Other long term (current) drug therapy: Secondary | ICD-10-CM | POA: Insufficient documentation

## 2018-02-04 DIAGNOSIS — J449 Chronic obstructive pulmonary disease, unspecified: Secondary | ICD-10-CM | POA: Insufficient documentation

## 2018-02-04 DIAGNOSIS — R55 Syncope and collapse: Secondary | ICD-10-CM

## 2018-02-04 DIAGNOSIS — M25532 Pain in left wrist: Secondary | ICD-10-CM | POA: Insufficient documentation

## 2018-02-04 DIAGNOSIS — I509 Heart failure, unspecified: Secondary | ICD-10-CM | POA: Insufficient documentation

## 2018-02-04 LAB — CBC WITH DIFFERENTIAL/PLATELET
BASOS PCT: 0 %
Basophils Absolute: 0 10*3/uL (ref 0.0–0.1)
EOS ABS: 0.3 10*3/uL (ref 0.0–0.7)
EOS PCT: 4 %
HCT: 37.4 % (ref 36.0–46.0)
HEMOGLOBIN: 12.4 g/dL (ref 12.0–15.0)
Lymphocytes Relative: 43 %
Lymphs Abs: 3.3 10*3/uL (ref 0.7–4.0)
MCH: 27.9 pg (ref 26.0–34.0)
MCHC: 33.2 g/dL (ref 30.0–36.0)
MCV: 84 fL (ref 78.0–100.0)
Monocytes Absolute: 0.5 10*3/uL (ref 0.1–1.0)
Monocytes Relative: 6 %
NEUTROS PCT: 47 %
Neutro Abs: 3.6 10*3/uL (ref 1.7–7.7)
PLATELETS: 324 10*3/uL (ref 150–400)
RBC: 4.45 MIL/uL (ref 3.87–5.11)
RDW: 13.9 % (ref 11.5–15.5)
WBC: 7.7 10*3/uL (ref 4.0–10.5)

## 2018-02-04 LAB — COMPREHENSIVE METABOLIC PANEL
ALBUMIN: 3.8 g/dL (ref 3.5–5.0)
ALT: 15 U/L (ref 14–54)
ANION GAP: 9 (ref 5–15)
AST: 22 U/L (ref 15–41)
Alkaline Phosphatase: 53 U/L (ref 38–126)
BILIRUBIN TOTAL: 0.5 mg/dL (ref 0.3–1.2)
BUN: 9 mg/dL (ref 6–20)
CALCIUM: 9.3 mg/dL (ref 8.9–10.3)
CO2: 19 mmol/L — ABNORMAL LOW (ref 22–32)
Chloride: 110 mmol/L (ref 101–111)
Creatinine, Ser: 0.49 mg/dL (ref 0.44–1.00)
Glucose, Bld: 95 mg/dL (ref 65–99)
POTASSIUM: 3.8 mmol/L (ref 3.5–5.1)
Sodium: 138 mmol/L (ref 135–145)
Total Protein: 7.1 g/dL (ref 6.5–8.1)

## 2018-02-04 LAB — I-STAT TROPONIN, ED
TROPONIN I, POC: 0 ng/mL (ref 0.00–0.08)
TROPONIN I, POC: 0 ng/mL (ref 0.00–0.08)

## 2018-02-04 MED ORDER — NAPROXEN 375 MG PO TABS: 375.0000 mg | ORAL_TABLET | Freq: Two times a day (BID) | ORAL | 0 refills | Status: DC

## 2018-02-04 MED ORDER — HYDROCODONE-ACETAMINOPHEN 5-325 MG PO TABS: 1.0000 | ORAL_TABLET | ORAL | 0 refills | Status: DC | PRN

## 2018-02-04 MED ORDER — OXYCODONE-ACETAMINOPHEN 5-325 MG PO TABS
1.0000 | ORAL_TABLET | Freq: Once | ORAL | Status: AC
  Administered 2018-02-04: 1 via ORAL
  Filled 2018-02-04: qty 1

## 2018-02-04 MED ORDER — KETOROLAC TROMETHAMINE 30 MG/ML IJ SOLN
15.0000 mg | Freq: Once | INTRAMUSCULAR | Status: AC
  Administered 2018-02-04: 15 mg via INTRAVENOUS
  Filled 2018-02-04: qty 1

## 2018-02-04 MED ORDER — SODIUM CHLORIDE 0.9 % IV BOLUS (SEPSIS)
500.0000 mL | Freq: Once | INTRAVENOUS | Status: AC
  Administered 2018-02-04: 500 mL via INTRAVENOUS

## 2018-02-04 NOTE — ED Provider Notes (Signed)
[place] EMERGENCY DEPARTMENT Provider Note   CSN: [id number] Arrival date & time: 02/04/18  3762     History   Chief Complaint Chief Complaint  Patient presents with  . Wrist Pain  . Arm Pain    HPI Caitlin [name] is a 52 y.o. female who presents today for evaluation of left wrist pain and tingling.  She reports that this pain started this morning when she woke up.  She says the pain is worse in her wrist and shoots down to her fingers.  She reports that all her fingers are numb, reports that it is more the thumb, first, second third and medial half of the 4th fingers but that her pinkey is also tingly also.  She denies history of carpal tunnel.  She denies SOB, CP, HA or neck pain.  No recent fevers or chills.  While evaluating her she reported nausea, feeling like she is going to pass out.    HPI  Past Medical History:  Diagnosis Date  . CHF (congestive heart failure) ([place])   . COPD (chronic obstructive pulmonary disease) ([place])   . NSTEMI (non-ST elevated myocardial infarction) [facility])     Patient Active Problem List   Diagnosis Date Noted  . Chronic obstructive pulmonary disease ([place])   . Takotsubo cardiomyopathy   . Tobacco abuse   . Acute respiratory failure with hypoxia ([place])   . COPD exacerbation ([place]) 07/15/2017  . Tobacco use 07/15/2017    Past Surgical History:  Procedure Laterality Date  . LEFT HEART CATH AND CORONARY ANGIOGRAPHY N/A 07/15/2017   Procedure: LEFT HEART CATH AND CORONARY ANGIOGRAPHY;  Surgeon: Martinique, Peter M, MD;  Location: [place] CV LAB;  Service: Cardiovascular;  Laterality: N/A;    OB History   None      Home Medications    Prior to Admission medications   Medication Sig Start Date End Date Taking? Authorizing Provider  albuterol (PROVENTIL HFA;VENTOLIN HFA) 108 (90 Base) MCG/ACT inhaler Inhale 2 puffs into the lungs every 4 (four) hours as needed for wheezing or shortness of breath. 08/17/17   Horton,  Barbette Hair, MD  arformoterol (BROVANA) 15 MCG/2ML NEBU Take 2 mLs (15 mcg total) 2 (two) times daily by nebulization. 10/04/17   Love, Ivan Anchors, PA-C  budesonide (PULMICORT) 0.5 MG/2ML nebulizer solution Take 2 mLs (0.5 mg total) 2 (two) times daily by nebulization. 10/04/17   Love, Ivan Anchors, PA-C  Fluticasone-Salmeterol (ADVAIR) 250-50 MCG/DOSE AEPB Inhale 1 puff into the lungs 2 (two) times daily.    [provider]  guaiFENesin (ROBITUSSIN) 100 MG/5ML SOLN Take 10 mLs (200 mg total) every 6 (six) hours as needed by mouth for cough or to loosen phlegm. 10/04/17   Love, Ivan Anchors, PA-C  nicotine (NICODERM CQ - DOSED IN MG/24 HR) 7 mg/24hr patch Place 1 patch (7 mg total) daily onto the skin. 10/04/17   Love, Ivan Anchors, PA-C  pantoprazole (PROTONIX) 40 MG tablet Take 1 tablet (40 mg total) daily by mouth. 10/05/17   Love, Ivan Anchors, PA-C  predniSONE (DELTASONE) 5 MG tablet One pill daily with breakfast till 11/23. Decrease to 1/2 pill daily starting 11/24 and use till gone. 10/04/17   LoveIvan Anchors, PA-C  thiamine 100 MG tablet Take 1 tablet (100 mg total) daily by mouth. 10/05/17   Love, Ivan Anchors, PA-C  vitamin B-12 (CYANOCOBALAMIN) 100 MCG tablet Take 100 mcg by mouth daily.    [provider]  Family History Family History  Problem Relation Age of Onset  . Heart disease Mother        age 21's  . Hypertension Mother   . Aneurysm Father        brain    Social History Social History   Tobacco Use  . Smoking status: Former Smoker    Packs/day: 1.00    Types: Cigarettes  . Smokeless tobacco: Never Used  . Tobacco comment: quit 07/12/17  Substance Use Topics  . Alcohol use: Yes    Comment: occasional alcohol  . Drug use: Yes    Types: Marijuana    Comment: Occasional marijuana     Allergies   Patient has no known allergies.   Review of Systems Review of Systems  Constitutional: Negative for chills and fever.  HENT: Negative for ear pain and sore throat.     Eyes: Negative for pain and visual disturbance.  Respiratory: Negative for cough and shortness of breath.   Cardiovascular: Negative for chest pain and palpitations.  Gastrointestinal: Negative for abdominal pain and vomiting.  Genitourinary: Negative for dysuria and hematuria.  Musculoskeletal: Negative for arthralgias and back pain.       Pain in left arm and wrist.   Skin: Negative for color change and rash.  Neurological: Positive for numbness (tingling in fingers on left hand). Negative for seizures and syncope.  All other systems reviewed and are negative.    Physical Exam Updated Vital Signs BP 108/76   Pulse 86   Temp 97.8 F (36.6 C) (Oral)   Resp 16   Ht 5\' 9"  (1.753 m)   Wt 72.1 kg (159 lb)   LMP 09/09/2014 (Approximate)   SpO2 98%   BMI 23.48 kg/m   Physical Exam  Constitutional: She is oriented to person, place, and time. She appears well-developed and well-nourished. No distress.  HENT:  Head: Normocephalic and atraumatic.  Mouth/Throat: Oropharynx is clear and moist.  Eyes: Conjunctivae are normal. Right eye exhibits no discharge. Left eye exhibits no discharge. No scleral icterus.  Neck: Normal range of motion.  Cardiovascular: Normal rate, regular rhythm and intact distal pulses.  Pulmonary/Chest: Effort normal. No stridor. No respiratory distress.  Abdominal: Soft. She exhibits no distension. There is no tenderness.  Musculoskeletal: She exhibits no edema or deformity.  TTP over left ventral wrist with swelling noted to the dorsal left hand over the thumb.    Neurological: She is alert and oriented to person, place, and time. She exhibits normal muscle tone.  Skin: Skin is warm and dry. She is not diaphoretic.  Psychiatric: She has a normal mood and affect. Her behavior is normal.  Nursing note and vitals reviewed.    ED Treatments / Results  Labs (all labs ordered are listed, but only abnormal results are displayed) Labs Reviewed  COMPREHENSIVE  METABOLIC PANEL  CBC WITH DIFFERENTIAL/PLATELET  CBG MONITORING, ED  I-STAT TROPONIN, ED    EKG  EKG Interpretation None       Radiology No results found.  Procedures Procedures (including critical care time)  Medications Ordered in ED Medications - No data to display   Initial Impression / Assessment and Plan / ED Course  I have reviewed the triage vital signs and the nursing notes.  Pertinent labs & imaging results that were available during my care of the patient were reviewed by me and considered in my medical decision making (see chart for details).  Clinical Course as of Feb 04 1714  Fri Feb 04, 2018  1005 Patient reported feeling nauseous, not well... BP was 55M systolic, laid back and improved to 080 systolic.     [EH]  1029 Orders placed, spoke with Dr. Ellender Hose about patient who agreed needs cardiac work up.    [EH]    Clinical Course User Index [EH] Lorin Glass, PA-C   Patient was transferred to the acute care section of the ED.  Care transferred.     Final Clinical Impressions(s) / ED Diagnoses   Final diagnoses:  None    ED Discharge Orders    None       Ollen Gross 02/04/18 1716    Duffy Bruce, MD 02/05/18 1052

## 2018-02-04 NOTE — Progress Notes (Signed)
Orthopedic Tech Progress Note Patient Details:  Caitlin [name] [facility] 02/20/1966 [id number]  Ortho Devices Type of Ortho Device: Thumb velcro splint Ortho Device/Splint Location: LUE Ortho Device/Splint Interventions: Ordered, Application   Post Interventions Patient Tolerated: Well Instructions Provided: Care of device   Caitlin [name] 02/04/2018, 3:14 PM

## 2018-02-04 NOTE — ED Notes (Signed)
Patient transported to X-ray 

## 2018-02-04 NOTE — ED Triage Notes (Signed)
PA to staff desk to report Pt was passing out. Vtals checked . Pt was ambulatory to room . Pt here today for Lt arm pain and swelling to Lt arm. Pt denies any injury to arm.

## 2018-02-04 NOTE — Discharge Instructions (Addendum)
Your workup has been reassuring in the ED.  Unknown exact cause of your left wrist pain.  May be related to a carpal tunnel syndrome.  Use the wrist splint, apply ice, get plenty of rest and elevate the arm.  Take the pain medication as needed as this will make you drowsy.  Please take the Naproxen as prescribed for pain. Do not take any additional NSAIDs including Motrin, Aleve, Ibuprofen, Advil.  Please follow-up with the orthopedic doctor if symptoms not improving return to ED with any worsening symptoms.  Also recommend he follow with a primary care doctor and cardiologist next week for your episode in the ED today.  Return the ED with any worsening symptoms.

## 2018-02-04 NOTE — ED Triage Notes (Signed)
The pt reports she woke up with pain in her left wrist, pain moving it and turning it up and down, the pain is sharp shooting and radiating up her left arm. She also states her left fingers and hand are numb and tingling. Pain is 9/10 to arm, 10/10 to wrist. Denies chest pain.

## 2018-02-04 NOTE — ED Notes (Signed)
Ambulated patient in hall. Patient ambulated well, with no complaints nor difficulties.

## 2018-02-04 NOTE — ED Provider Notes (Signed)
[place] EMERGENCY DEPARTMENT Provider Note   CSN: [id number] Arrival date & time: 02/04/18  2706     History   Chief Complaint Chief Complaint  Patient presents with  . Wrist Pain  . Arm Pain    HPI Kennita Pavlovich is a 52 y.o. female.  HPI 52 year old African-American female past medical history significant for COPD, tobacco abuse, , CHF last EF in 08/2017 was 55-60%, cardiomyopathy due to Takotsubo that presents to the emergency department today initially with complaints of left wrist pain.  Patient states that it started yesterday has gradually worsened.  States the pain is worse with palpation and range of motion the left wrist.  She denies any known injury.  States that she does use a computer.  States that she is right-handed.  Denies any associated erythema, warmth, fevers, chills, nausea, vomiting.  Patient has not take anything for the pain prior to arrival.  She reports associated paresthesias in the left hand.  States that the pain is worse in her wrist and she is down to her fingers.  The pain worsened when she awoke from sleep this morning.  Reports that all of her fingers are numb reports that the thumb first second third and fourth fingers are more so than the pinky finger.  She denies any history of carpal tunnel syndrome.  While being evaluated in fast track by prior provider patient states that she became nauseous and "not feeling well".  Blood pressure was taken and she was noted to have a blood pressure of 80 systolic.  Patient was laid back and her blood pressure improved.  Patient was given IV fluids.  Given history she was sent to the back for further workup.  Patient never had a full syncopal episode.  Patient was noted to have a normal catheterization in 2018 by cardiology.  Patient denies take any medications at this time.  On my examination patient only complains of left wrist pain.  Denies any associated chest pain, shortness breath,  lightheadedness, dizziness, nausea, vomiting, abdominal pain, urinary symptoms, change in bowel habits, headache, vision changes.  Past Medical History:  Diagnosis Date  . CHF (congestive heart failure) ([place])   . COPD (chronic obstructive pulmonary disease) ([place])   . NSTEMI (non-ST elevated myocardial infarction) [facility])     Patient Active Problem List   Diagnosis Date Noted  . Chronic obstructive pulmonary disease ([place])   . Takotsubo cardiomyopathy   . Tobacco abuse   . Acute respiratory failure with hypoxia ([place])   . COPD exacerbation ([place]) 07/15/2017  . Tobacco use 07/15/2017    Past Surgical History:  Procedure Laterality Date  . LEFT HEART CATH AND CORONARY ANGIOGRAPHY N/A 07/15/2017   Procedure: LEFT HEART CATH AND CORONARY ANGIOGRAPHY;  Surgeon: Martinique, Peter M, MD;  Location: [place] CV LAB;  Service: Cardiovascular;  Laterality: N/A;     OB History   None      Home Medications    Prior to Admission medications   Medication Sig Start Date End Date Taking? Authorizing Provider  albuterol (PROVENTIL HFA;VENTOLIN HFA) 108 (90 Base) MCG/ACT inhaler Inhale 2 puffs into the lungs every 4 (four) hours as needed for wheezing or shortness of breath. 08/17/17   Horton, Barbette Hair, MD  arformoterol (BROVANA) 15 MCG/2ML NEBU Take 2 mLs (15 mcg total) 2 (two) times daily by nebulization. 10/04/17   Love, Ivan Anchors, PA-C  budesonide (PULMICORT) 0.5 MG/2ML nebulizer solution Take 2 mLs (0.5 mg total)  2 (two) times daily by nebulization. 10/04/17   Love, Ivan Anchors, PA-C  Fluticasone-Salmeterol (ADVAIR) 250-50 MCG/DOSE AEPB Inhale 1 puff into the lungs 2 (two) times daily.    [provider]  guaiFENesin (ROBITUSSIN) 100 MG/5ML SOLN Take 10 mLs (200 mg total) every 6 (six) hours as needed by mouth for cough or to loosen phlegm. 10/04/17   Love, Ivan Anchors, PA-C  nicotine (NICODERM CQ - DOSED IN MG/24 HR) 7 mg/24hr patch Place 1 patch (7 mg total) daily onto the skin. 10/04/17    Love, Ivan Anchors, PA-C  pantoprazole (PROTONIX) 40 MG tablet Take 1 tablet (40 mg total) daily by mouth. 10/05/17   Love, Ivan Anchors, PA-C  predniSONE (DELTASONE) 5 MG tablet One pill daily with breakfast till 11/23. Decrease to 1/2 pill daily starting 11/24 and use till gone. 10/04/17   LoveIvan Anchors, PA-C  thiamine 100 MG tablet Take 1 tablet (100 mg total) daily by mouth. 10/05/17   Love, Ivan Anchors, PA-C  vitamin B-12 (CYANOCOBALAMIN) 100 MCG tablet Take 100 mcg by mouth daily.    [provider]    Family History Family History  Problem Relation Age of Onset  . Heart disease Mother        age 78's  . Hypertension Mother   . Aneurysm Father        brain    Social History Social History   Tobacco Use  . Smoking status: Former Smoker    Packs/day: 1.00    Types: Cigarettes  . Smokeless tobacco: Never Used  . Tobacco comment: quit 07/12/17  Substance Use Topics  . Alcohol use: Yes    Comment: occasional alcohol  . Drug use: Yes    Types: Marijuana    Comment: Occasional marijuana     Allergies   Patient has no known allergies.   Review of Systems Review of Systems  All other systems reviewed and are negative.    Physical Exam Updated Vital Signs BP 123/83   Pulse 87   Temp 97.8 F (36.6 C) (Oral)   Resp 17   Ht 5\' 9"  (1.753 m)   Wt 72.1 kg (159 lb)   LMP 09/09/2014 (Approximate)   SpO2 97%   BMI 23.48 kg/m   Physical Exam  Constitutional: She is oriented to person, place, and time. She appears well-developed and well-nourished.  Non-toxic appearance. No distress.  HENT:  Head: Normocephalic and atraumatic.  Nose: Nose normal.  Mouth/Throat: Oropharynx is clear and moist.  Eyes: Pupils are equal, round, and reactive to light. Conjunctivae are normal. Right eye exhibits no discharge. Left eye exhibits no discharge.  Neck: Normal range of motion. Neck supple. No JVD present. No tracheal deviation present.  Cardiovascular: Normal rate, regular  rhythm, normal heart sounds and intact distal pulses. Exam reveals no gallop and no friction rub.  No murmur heard. Pulmonary/Chest: Effort normal and breath sounds normal. No stridor. No respiratory distress. She has no wheezes. She has no rales. She exhibits no tenderness.  No hypoxia or tachypnea.  Abdominal: Soft. Bowel sounds are normal. She exhibits no distension. There is no tenderness. There is no rebound and no guarding.  Musculoskeletal: Normal range of motion. She exhibits no tenderness.  TTP over left ventral wrist with swelling noted to the dorsal left hand over the thumb.  There is no associated erythema or warmth of the joint.  Patient does have pain with range of motion of the left wrist however she is able to  move it slightly.  She is very tender to palpation over the ventral aspect of the wrist. Pt with pain with finnkelstein test.  Positive Tinel's and Phalen's test.  This reproduces pain and paresthesias.  Full range of motion of the left elbow without pain.  Skin compartments are soft.  Radial pulses are 2+ bilaterally.  Brisk cap refill sensation intact.  Lymphadenopathy:    She has no cervical adenopathy.  Neurological: She is alert and oriented to person, place, and time.  Skin: Skin is warm and dry. Capillary refill takes less than 2 seconds. She is not diaphoretic.  Psychiatric: Her behavior is normal. Judgment and thought content normal.  Nursing note and vitals reviewed.    ED Treatments / Results  Labs (all labs ordered are listed, but only abnormal results are displayed) Labs Reviewed  COMPREHENSIVE METABOLIC PANEL - Abnormal; Notable for the following components:      Result Value   CO2 19 (*)    All other components within normal limits  CBC WITH DIFFERENTIAL/PLATELET  CBG MONITORING, ED  I-STAT TROPONIN, ED  I-STAT TROPONIN, ED    EKG EKG Interpretation  Date/Time:  Friday February 04 2018 10:11:43 EDT Ventricular Rate:  87 PR Interval:    QRS  Duration: 85 QT Interval:  376 QTC Calculation: 453 R Axis:   71 Text Interpretation:  Sinus rhythm Probable anteroseptal infarct, old No significant change since last tracing Confirmed by Duffy Bruce [phone]) on 02/04/2018 10:26:04 AM   Radiology Dg Chest 2 View  Result Date: 02/04/2018 CLINICAL DATA:  Left arm tingling, presyncopal event EXAM: CHEST - 2 VIEW COMPARISON:  09/17/2017 FINDINGS: The heart size and mediastinal contours are within normal limits. Both lungs are clear. The visualized skeletal structures are unremarkable. IMPRESSION: No active cardiopulmonary disease. Electronically Signed   By: Kathreen Devoid   On: 02/04/2018 12:09   Dg Wrist Complete Left  Result Date: 02/04/2018 CLINICAL DATA:  Left wrist pain radiating into the left arm. Numbness and tingling in the left hand and fingers. No known injury. EXAM: LEFT WRIST - COMPLETE 3+ VIEW COMPARISON:  Left hand radiographs obtained at the same time. FINDINGS: There is no evidence of fracture or dislocation. There is no evidence of arthropathy or other focal bone abnormality. Soft tissues are unremarkable. IMPRESSION: Normal examination. Electronically Signed   By: Claudie Revering M.D.   On: 02/04/2018 12:01   Dg Hand Complete Left  Result Date: 02/04/2018 CLINICAL DATA:  Left wrist pain radiating up the left arm. Left fingers and hand numbness and tingling. No known injury. EXAM: LEFT HAND - COMPLETE 3+ VIEW COMPARISON:  Left wrist radiographs obtained at the same time. FINDINGS: There is no evidence of fracture or dislocation. There is no evidence of arthropathy or other focal bone abnormality. Soft tissues are unremarkable. IMPRESSION: Normal examination. Electronically Signed   By: Claudie Revering M.D.   On: 02/04/2018 12:00    Procedures Procedures (including critical care time)  Medications Ordered in ED Medications  ketorolac (TORADOL) 30 MG/ML injection 15 mg (15 mg Intravenous Given 02/04/18 1300)  sodium chloride 0.9 %  bolus 500 mL (500 mLs Intravenous New Bag/Given 02/04/18 1302)  oxyCODONE-acetaminophen (PERCOCET/ROXICET) 5-325 MG per tablet 1 tablet (1 tablet Oral Given 02/04/18 1300)     Initial Impression / Assessment and Plan / ED Course  I have reviewed the triage vital signs and the nursing notes.  Pertinent labs & imaging results that were available during my care of the patient  were reviewed by me and considered in my medical decision making (see chart for details).  Clinical Course as of Feb 05 1444  Fri Feb 04, 2018  1005 Patient reported feeling nauseous, not well... BP was 27O systolic, laid back and improved to 536 systolic.     [EH]  1029 Orders placed, spoke with Dr. Ellender Hose about patient who agreed needs cardiac work up.    [EH]    Clinical Course User Index [EH] Lorin Glass, PA-C    Patient presents to the emergency department today for evaluation of left wrist pain was being seen at fast track when while the provider's examination with her left wrist patient became nauseous and not feeling well.  Blood pressure noted to drop and was resolved with supine position.  Patient denies any associated chest pain or shortness of breath.  Denies any syncope.  Patient does have a history of CHF due to cardiomyopathy last EF was 50-60%.  To my examination denies any associated symptoms except for the left wrist pain.  Patient overall well-appearing and nontoxic on my examination.  Her vital signs are reassuring.  Patient is afebrile, no tachycardia or hypotension is noted.  She is not hypoxic or tachypneic.  Patient with regular rate and rhythm with no rubs murmurs or gallops.  Lungs clear to auscultation bilaterally.  No focal abdominal tenderness, pelvic exam.  Patient has no focal neuro deficits.  She does have tenderness to palpation over the ventral aspect of the left wrist without any erythema, warmth.  She has some mild swelling over the dorsal aspect of the left thumb.  Patient  reports associated paresthesias with positive Phalen's and Tinel sign.  She was not orthostatic.  Patient's cardiac workup has been reassuring.  Lab work is reassuring.  No leukocytosis.  Normal hemoglobin.  Electrolytes are reassuring with normal kidney function and liver function.  Negative delta troponin.  EKG appears at patient's prior without any signs of acute ischemia.  This was reviewed by myself.  Chest x-ray shows no signs of acute findings.  X-rays of left wrist and left hand showed no effusion or arthritic changes.  Patient's pain improved with Toradol and pain medication.  As she did regain some range of motion with the left wrist.  Patient is neurovascularly intact.  I have very low suspicion for septic arthritis at this time as patient does have some mobility of the left wrist is she is afebrile without any white count.  However have give patient very strict return precautions.  Patient symptoms do seem consistent with carpal tunnel syndrome.  I encouraged symptom medic treatment at home along with wrist splint.  Have given her hand follow-up.  Terms of patient's a presyncopal episode in the ED feel this is related to vasovagal and possibly pain.  Patient has no chest pain or shortness of breath.  Her vital signs are reassuring.  She is not orthostatic.  Negative cardiac workup in the ED today.  Do not feel the patient needs admission at this time.  Patient states that she feels much improved.  She is able to ambulate with normal gait and feels without any symptoms.  She would like to be discharged with outpatient follow-up.  I feel this is agreeable.  Patient has been watched for several hours in the ED without any further symptoms.  Pt is hemodynamically stable, in NAD, & able to ambulate in the ED. Evaluation does not show pathology that would require ongoing emergent intervention or inpatient  treatment. I explained the diagnosis to the patient. Pain has been managed & has no complaints  prior to dc. Pt is comfortable with above plan and is stable for discharge at this time. All questions were answered prior to disposition. Strict return precautions for f/u to the ED were discussed. Encouraged follow up with PCP.  She was also discussed with my attending who is agreed with the above plan.  Final Clinical Impressions(s) / ED Diagnoses   Final diagnoses:  Left wrist pain  Near syncope    ED Discharge Orders        Ordered    HYDROcodone-acetaminophen (NORCO/VICODIN) 5-325 MG tablet  Every 4 hours PRN     02/04/18 1506    naproxen (NAPROSYN) 375 MG tablet  2 times daily     02/04/18 1506       Aaron Edelman 02/04/18 1509    Duffy Bruce, MD 02/05/18 1052

## 2018-02-09 ENCOUNTER — Other Ambulatory Visit: Payer: Self-pay | Admitting: Obstetrics and Gynecology

## 2018-02-09 DIAGNOSIS — Z1231 Encounter for screening mammogram for malignant neoplasm of breast: Secondary | ICD-10-CM

## 2018-03-03 ENCOUNTER — Encounter (HOSPITAL_COMMUNITY): Payer: Self-pay

## 2018-03-03 ENCOUNTER — Ambulatory Visit (HOSPITAL_COMMUNITY)
Admission: RE | Admit: 2018-03-03 | Discharge: 2018-03-03 | Disposition: A | Discharge status: Home / Self Care | Payer: Medicaid Other | Source: Ambulatory Visit | Attending: Obstetrics and Gynecology | Admitting: Obstetrics and Gynecology

## 2018-03-03 ENCOUNTER — Ambulatory Visit
Admission: RE | Admit: 2018-03-03 | Discharge: 2018-03-03 | Disposition: A | Discharge status: Home / Self Care | Payer: No Typology Code available for payment source | Source: Ambulatory Visit | Attending: Obstetrics and Gynecology | Admitting: Obstetrics and Gynecology

## 2018-03-03 ENCOUNTER — Other Ambulatory Visit: Payer: Self-pay

## 2018-03-03 VITALS — BP 110/72 | Ht 69.0 in | Wt 164.8 lb

## 2018-03-03 DIAGNOSIS — Z1231 Encounter for screening mammogram for malignant neoplasm of breast: Secondary | ICD-10-CM

## 2018-03-03 DIAGNOSIS — Z01419 Encounter for gynecological examination (general) (routine) without abnormal findings: Secondary | ICD-10-CM

## 2018-03-03 NOTE — Patient Instructions (Signed)
Explained breast self awareness with [name]. Let patient know [facility] will cover Pap smears and HPV typing every 5 years unless has a history of abnormal Pap smears. Referred patient to the [facility] for a screening mammogram. Appointment scheduled for Thursday, March 03, 2018 at 1040. Let patient know will follow up with her within the next couple weeks with results of Pap smear by letter or phone. Informed patient that the [facility] will follow-up with her within the next couple of weeks with results of mammogram by letter or phone. Caitlin [name] Spray verbalized understanding.  Brannock, Arvil Chaco, RN 10:53 AM

## 2018-03-03 NOTE — Progress Notes (Signed)
No complaints today.   Pap Smear: Pap smear completed today. Last Pap smear was 30 years ago and normal per patient. Per patient has no history of an abnormal Pap smear. No Pap smear results are in Epic.  Physical exam: Breasts Breasts symmetrical. No skin abnormalities bilateral breasts. No nipple retraction bilateral breasts. No nipple discharge bilateral breasts. No lymphadenopathy. No lumps palpated bilateral breasts. No complaints of pain or tenderness on exam. Referred patient to the [facility] for a screening mammogram. Appointment scheduled for Thursday, March 03, 2018 at 1040.        Pelvic/Bimanual   Ext Genitalia No lesions, no swelling and no discharge observed on external genitalia.         Vagina Vagina pink and normal texture. No lesions or discharge observed in vagina.          Cervix Cervix is present. Cervix pink and of normal texture. No discharge observed.     Uterus Uterus is present and palpable. Uterus is slightly enlarged and tilted to the right.     Adnexae Bilateral ovaries present and palpable. No tenderness on palpation.         Rectovaginal No rectal exam completed today since patient had no rectal complaints. No skin abnormalities observed on exam.    Smoking History: Patient is a former smoker that quite 08/16/2017.  Patient Navigation: Patient education provided. Access to services provided for patient through [employer] program.   Colorectal Cancer Screening: Per patient has never had a colonoscopy completed. No complaints today. FIT Test given to patient to complete and return to [facility].  Breast and Cervical Cancer Risk Assessment: Patient has no family history of breast cancer, known genetic mutations, or radiation treatment to the chest before age 6. Patient has no history of cervical dysplasia, immunocompromised, or DES exposure in-utero.

## 2018-03-08 LAB — CYTOLOGY - PAP
Diagnosis: NEGATIVE
HPV (WINDOPATH): NOT DETECTED

## 2018-03-09 LAB — FECAL OCCULT BLOOD, IMMUNOCHEMICAL: Fecal Occult Bld: NEGATIVE

## 2018-05-20 NOTE — Progress Notes (Signed)
HPI: FU takotsubo cardiomyopathy. Admitted and ruled in for MI 8/18. Cath showed no CAD. Echo c/w takotsubo. FU echo 10/18 showed normal LV function, moderate TR, small pericardial effusion. Also with COPD. Since last seen, the patient has dyspnea with more extreme activities but not with routine activities. It is relieved with rest. It is not associated with chest pain. There is no orthopnea, PND or pedal edema. There is no syncope or palpitations. There is no exertional chest pain.   Current Outpatient Medications  Medication Sig Dispense Refill  . albuterol (PROVENTIL HFA;VENTOLIN HFA) 108 (90 Base) MCG/ACT inhaler Inhale 2 puffs into the lungs every 4 (four) hours as needed for wheezing or shortness of breath. 1 Inhaler 0  . Fluticasone-Salmeterol (ADVAIR) 250-50 MCG/DOSE AEPB Inhale 1 puff into the lungs 2 (two) times daily.    Marland Kitchen arformoterol (BROVANA) 15 MCG/2ML NEBU Take 2 mLs (15 mcg total) 2 (two) times daily by nebulization. (Patient not taking: Reported on 05/27/2018) 120 mL 0  . budesonide (PULMICORT) 0.5 MG/2ML nebulizer solution Take 2 mLs (0.5 mg total) 2 (two) times daily by nebulization. (Patient not taking: Reported on 05/27/2018) 120 mL 0  . guaiFENesin (ROBITUSSIN) 100 MG/5ML SOLN Take 10 mLs (200 mg total) every 6 (six) hours as needed by mouth for cough or to loosen phlegm. (Patient not taking: Reported on 05/27/2018) 1200 mL 0  . HYDROcodone-acetaminophen (NORCO/VICODIN) 5-325 MG tablet Take 1-2 tablets by mouth every 4 (four) hours as needed. (Patient not taking: Reported on 03/03/2018) 8 tablet 0  . naproxen (NAPROSYN) 375 MG tablet Take 1 tablet (375 mg total) by mouth 2 (two) times daily. (Patient not taking: Reported on 03/03/2018) 20 tablet 0  . nicotine (NICODERM CQ - DOSED IN MG/24 HR) 7 mg/24hr patch Place 1 patch (7 mg total) daily onto the skin. (Patient not taking: Reported on 03/03/2018) 28 patch 0  . pantoprazole (PROTONIX) 40 MG tablet Take 1 tablet (40 mg  total) daily by mouth. (Patient not taking: Reported on 03/03/2018) 30 tablet 0  . predniSONE (DELTASONE) 5 MG tablet One pill daily with breakfast till 11/23. Decrease to 1/2 pill daily starting 11/24 and use till gone. (Patient not taking: Reported on 03/03/2018) 8 tablet 0  . thiamine 100 MG tablet Take 1 tablet (100 mg total) daily by mouth. (Patient not taking: Reported on 03/03/2018) 30 tablet 0  . vitamin B-12 (CYANOCOBALAMIN) 100 MCG tablet Take 100 mcg by mouth daily.     No current facility-administered medications for this visit.      Past Medical History:  Diagnosis Date  . CHF (congestive heart failure) ([place])   . COPD (chronic obstructive pulmonary disease) ([place])   . NSTEMI (non-ST elevated myocardial infarction) ([place])   . Takotsubo cardiomyopathy     Past Surgical History:  Procedure Laterality Date  . LEFT HEART CATH AND CORONARY ANGIOGRAPHY N/A 07/15/2017   Procedure: LEFT HEART CATH AND CORONARY ANGIOGRAPHY;  Surgeon: Martinique, Peter M, MD;  Location: [facility];  Service: Cardiovascular;  Laterality: N/A;    Social History   Socioeconomic History  . Marital status: Divorced    Spouse name: Not on file  . Number of children: Not on file  . Years of education: Not on file  . Highest education level: Not on file  Occupational History  . Occupation: CNA  Social Needs  . Financial resource strain: Not on file  . Food insecurity:    Worry: Not on file  Inability: Not on file  . Transportation needs:    Medical: Not on file    Non-medical: Not on file  Tobacco Use  . Smoking status: Former Smoker    Packs/day: 1.00    Types: Cigarettes    Last attempt to quit: 08/16/2017    Years since quitting: 0.7  . Smokeless tobacco: Never Used  . Tobacco comment: quit 07/12/17  Substance and Sexual Activity  . Alcohol use: Yes    Comment: occasional alcohol  . Drug use: Not Currently    Types: Marijuana    Comment: Occasional marijuana quit 6 months ago   .  Sexual activity: Yes    Birth control/protection: None  Lifestyle  . Physical activity:    Days per week: Not on file    Minutes per session: Not on file  . Stress: Not on file  Relationships  . Social connections:    Talks on phone: Not on file    Gets together: Not on file    Attends religious service: Not on file    Active member of club or organization: Not on file    Attends meetings of clubs or organizations: Not on file    Relationship status: Not on file  . Intimate partner violence:    Fear of current or ex partner: Not on file    Emotionally abused: Not on file    Physically abused: Not on file    Forced sexual activity: Not on file  Other Topics Concern  . Not on file  Social History Narrative  . Not on file    Family History  Problem Relation Age of Onset  . Heart disease Mother        age 15's  . Hypertension Mother   . Aneurysm Father        brain    ROS: no fevers or chills, productive cough, hemoptysis, dysphasia, odynophagia, melena, hematochezia, dysuria, hematuria, rash, seizure activity, orthopnea, PND, pedal edema, claudication. Remaining systems are negative.  Physical Exam: Well-developed well-nourished in no acute distress.  Skin is warm and dry.  HEENT is normal.  Neck is supple.  Chest is clear to auscultation with normal expansion.  Cardiovascular exam is regular rate and rhythm.  Abdominal exam nontender or distended. No masses palpated. Extremities show no edema. neuro grossly intact   A/P  1 Takotsubo CM-follow-up echocardiogram showed normalization of LV function.  No further therapy at this point.  2 tobacco abuse-patient has discontinued.  3 COPD-management per pulmonary.  Kirk Ruths, MD

## 2018-05-25 ENCOUNTER — Encounter (HOSPITAL_COMMUNITY): Payer: Self-pay | Admitting: *Deleted

## 2018-05-25 NOTE — Progress Notes (Signed)
Letter mailed to patient with negative pap smear results. HPV was negative. Next pap smear due in five years. 

## 2018-05-26 ENCOUNTER — Encounter: Payer: Self-pay | Admitting: Cardiology

## 2018-05-27 ENCOUNTER — Ambulatory Visit (INDEPENDENT_AMBULATORY_CARE_PROVIDER_SITE_OTHER): Payer: Self-pay | Admitting: Cardiology

## 2018-05-27 ENCOUNTER — Encounter: Payer: Self-pay | Admitting: Cardiology

## 2018-05-27 VITALS — BP 132/78 | HR 72 | Ht 69.0 in | Wt 173.8 lb

## 2018-05-27 DIAGNOSIS — Z720 Tobacco use: Secondary | ICD-10-CM

## 2018-05-27 DIAGNOSIS — J449 Chronic obstructive pulmonary disease, unspecified: Secondary | ICD-10-CM

## 2018-05-27 DIAGNOSIS — I5181 Takotsubo syndrome: Secondary | ICD-10-CM

## 2018-05-27 NOTE — Patient Instructions (Signed)
Your physician recommends that you schedule a follow-up appointment in: as needed  

## 2018-10-22 IMAGING — MG DIGITAL SCREENING BILATERAL MAMMOGRAM WITH TOMO AND CAD
8 series · 8 of 24 positions shown · non-contrast
Comparison: None.

CLINICAL DATA: Screening.

EXAM:
DIGITAL SCREENING BILATERAL MAMMOGRAM WITH TOMO AND CAD

[L MLO synth-2D]
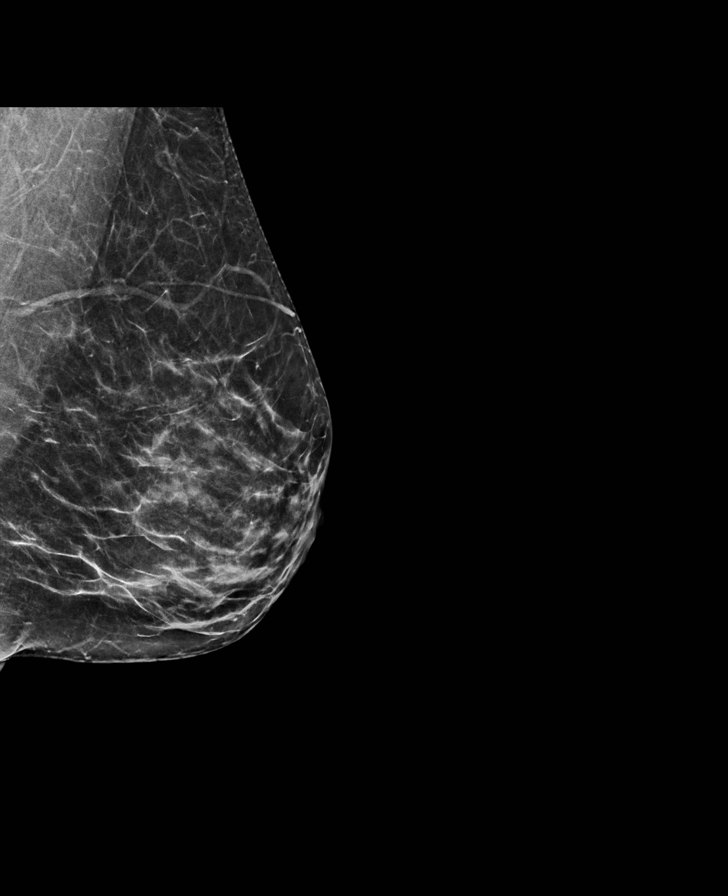

[R MLO synth-2D]
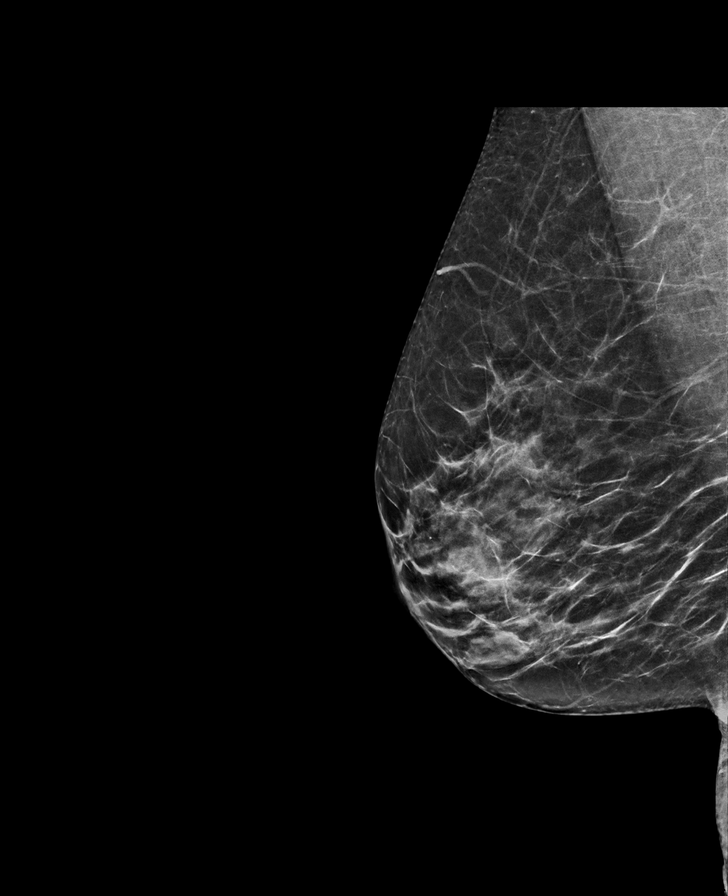

[L CC synth-2D]
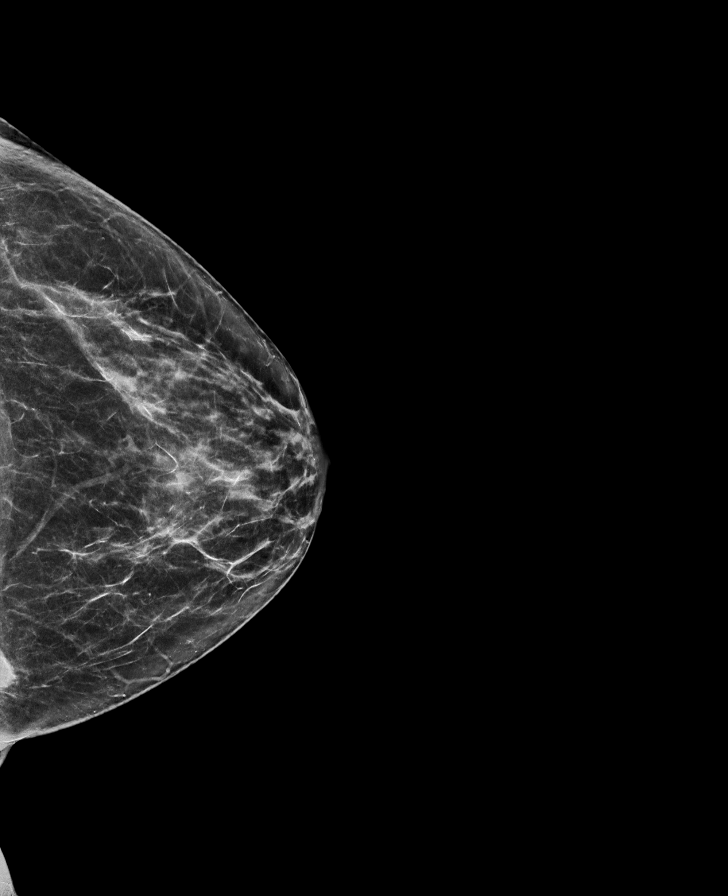

[R CC synth-2D]
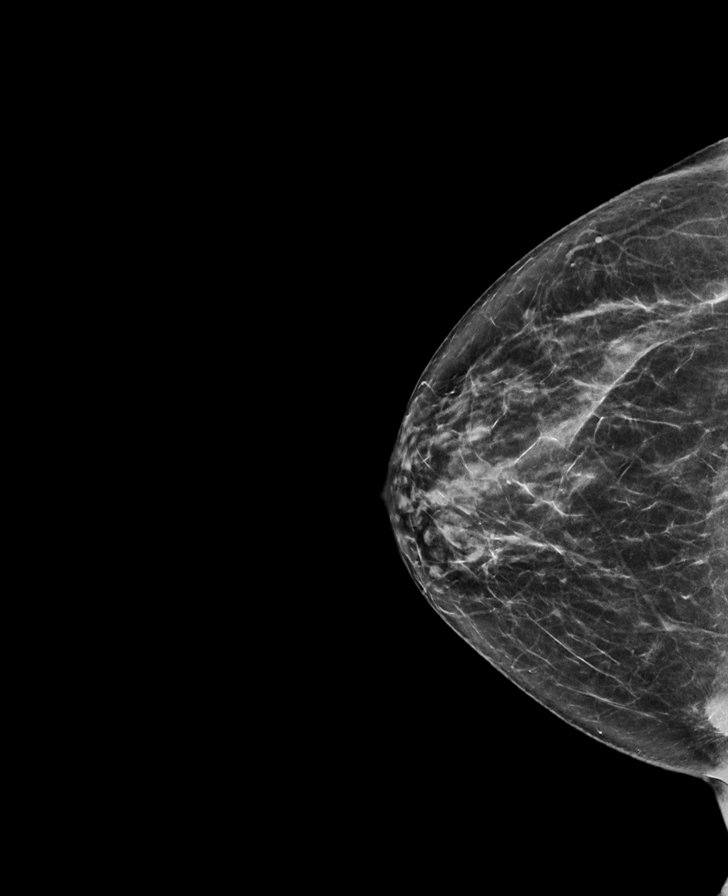

[R MLO tomo · tomo slice 34/67.0]
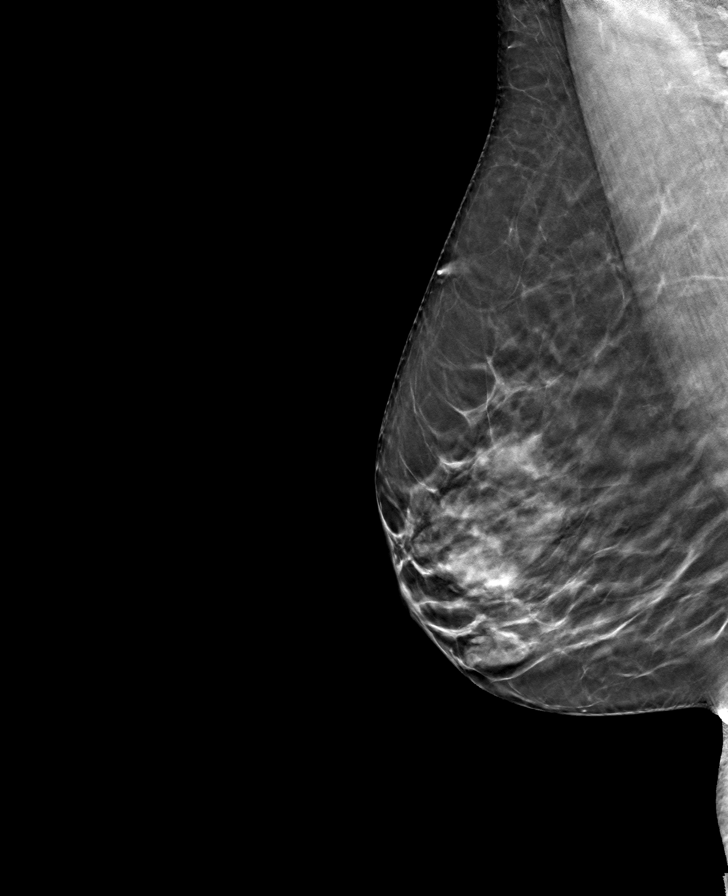

[L CC tomo · tomo slice 35/68.0]
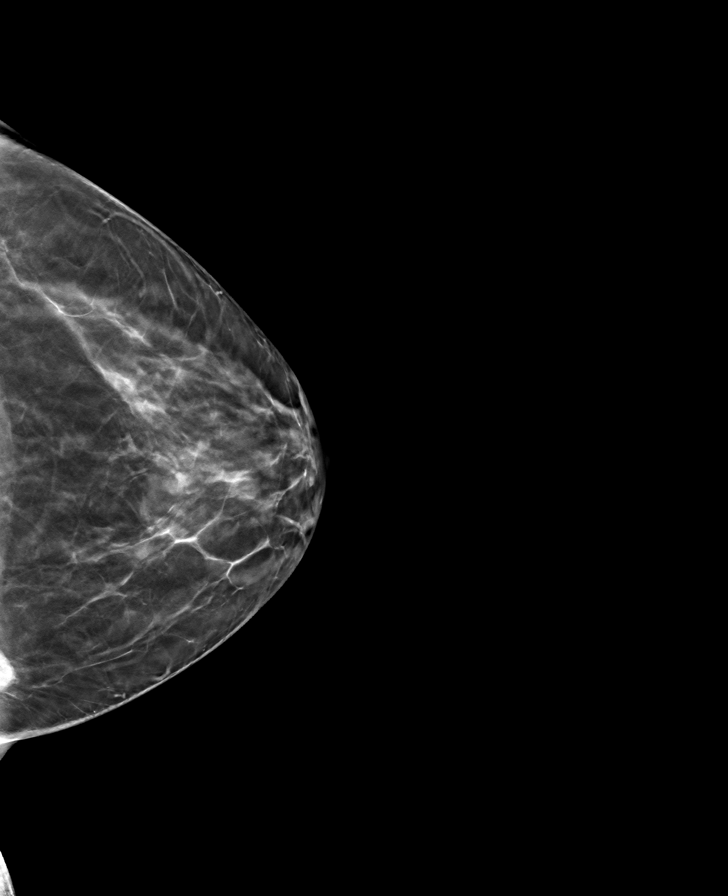

[L MLO tomo · tomo slice 33/65.0]
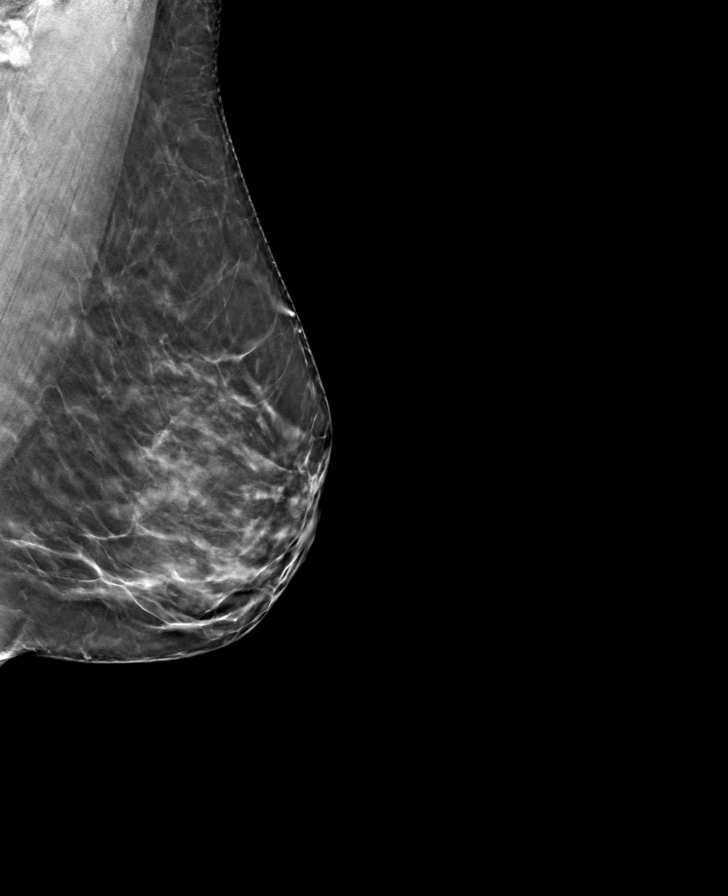

[R CC tomo · tomo slice 35/70.0]
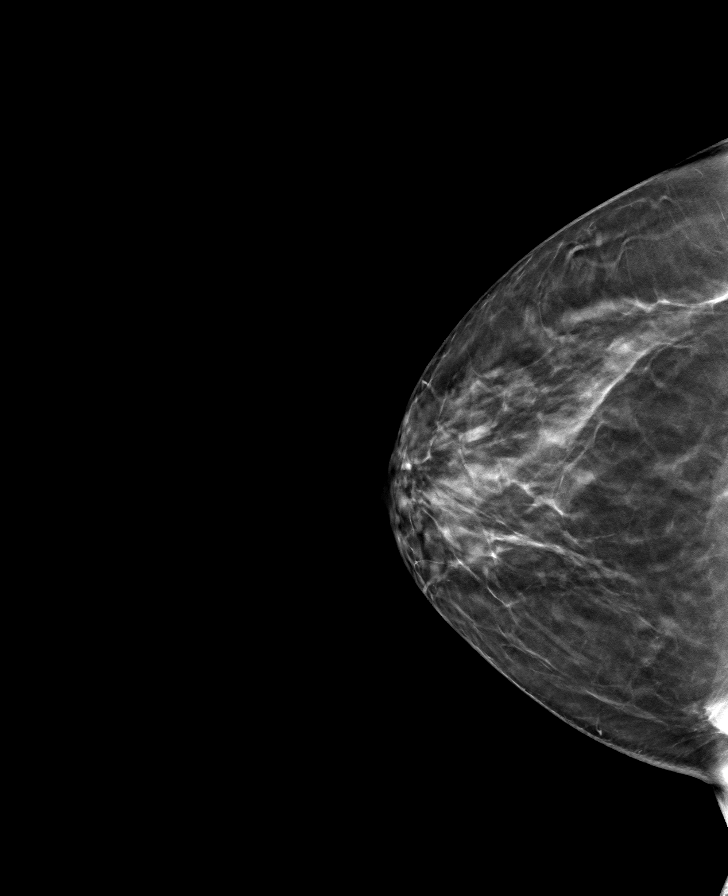

[8 of 24 positions shown; findings below may reference images not displayed]

ACR Breast Density Category c: The breast tissue is heterogeneously
dense, which may obscure small masses
FINDINGS: There are no findings suspicious for malignancy. Images were
processed with CAD.
IMPRESSION: No mammographic evidence of malignancy. A result letter of this
screening mammogram will be mailed directly to the patient.

RECOMMENDATION:
Screening mammogram in one year. (Code:EM-2-IHY)

BI-RADS CATEGORY  1: Negative.

## 2018-11-22 ENCOUNTER — Ambulatory Visit (INDEPENDENT_AMBULATORY_CARE_PROVIDER_SITE_OTHER): Payer: Self-pay | Admitting: Pharmacist

## 2018-11-22 DIAGNOSIS — J441 Chronic obstructive pulmonary disease with (acute) exacerbation: Secondary | ICD-10-CM

## 2018-11-22 DIAGNOSIS — J449 Chronic obstructive pulmonary disease, unspecified: Secondary | ICD-10-CM

## 2018-11-22 DIAGNOSIS — Z7951 Long term (current) use of inhaled steroids: Secondary | ICD-10-CM

## 2018-11-22 DIAGNOSIS — Z87891 Personal history of nicotine dependence: Secondary | ICD-10-CM

## 2018-11-22 DIAGNOSIS — Z79899 Other long term (current) drug therapy: Secondary | ICD-10-CM

## 2018-11-22 DIAGNOSIS — Z23 Encounter for immunization: Secondary | ICD-10-CM

## 2018-11-22 MED ORDER — UMECLIDINIUM-VILANTEROL 62.5-25 MCG/INH IN AEPB: 1.0000 | INHALATION_SPRAY | Freq: Every day | RESPIRATORY_TRACT | 11 refills | Status: DC

## 2018-11-22 NOTE — Progress Notes (Addendum)
Caitlin [name] is a 53 y.o. female reports to clinical pharmacist appointment for COPD medication management.   Patient's current COPD medication regimen consists of: Advair and PRN albuterol  Patient Actions; denies-reports: denies COPD related symptoms, use of rescue inhaler within the past month, and no recent COPD exacerbation. Patient is a former smoker  No Known Allergies  Past Medical History:  Diagnosis Date  . CHF (congestive heart failure) ([place])   . COPD (chronic obstructive pulmonary disease) ([place])   . NSTEMI (non-ST elevated myocardial infarction) ([place])   . Takotsubo cardiomyopathy    Social History   Socioeconomic History  . Marital status: Divorced    Spouse name: Not on file  . Number of children: Not on file  . Years of education: Not on file  . Highest education level: Not on file  Occupational History  . Occupation: CNA  Social Needs  . Financial resource strain: Not on file  . Food insecurity:    Worry: Not on file    Inability: Not on file  . Transportation needs:    Medical: Not on file    Non-medical: Not on file  Tobacco Use  . Smoking status: Former Smoker    Packs/day: 1.00    Types: Cigarettes    Last attempt to quit: 08/16/2017    Years since quitting: 1.2  . Smokeless tobacco: Never Used  . Tobacco comment: quit 07/12/17  Substance and Sexual Activity  . Alcohol use: Yes    Comment: occasional alcohol  . Drug use: Not Currently    Types: Marijuana    Comment: Occasional marijuana quit 6 months ago   . Sexual activity: Yes    Birth control/protection: None  Lifestyle  . Physical activity:    Days per week: Not on file    Minutes per session: Not on file  . Stress: Not on file  Relationships  . Social connections:    Talks on phone: Not on file    Gets together: Not on file    Attends religious service: Not on file    Active member of club or organization: Not on file    Attends meetings of clubs or organizations: Not on file    Relationship status: Not on file  Other Topics Concern  . Not on file  Social History Narrative  . Not on file   Family History  Problem Relation Age of Onset  . Heart disease Mother        age 52's  . Hypertension Mother   . Aneurysm Father        brain   O: Ht Readings from Last 2 Encounters:  05/27/18 5\' 9"  (1.753 m)  03/03/18 5\' 9"  (1.753 m)   Wt Readings from Last 2 Encounters:  05/27/18 173 lb 12.8 oz (78.8 kg)  03/03/18 164 lb 12.8 oz (74.8 kg)   There is no height or weight on file to calculate BMI.  CAT ASSESSMENT  Rank each of the following items on a scale of 0 to 5 (with 5 being most severe) Write a # 0-5 in each box  I never cough (0) > I cough all the time (5) 2  I have no phlegm (mucus) in my chest (0) > My chest is completely full of phlegm (mucus) (5) 1  My chest does not feel tight at all (0) > My chest feels very tight (5) 0  When I walk up a hill or one flight of stairs I am not  breathless (0) > When I walk up a hill or one flight of stairs I am very breathless (5) 4  I am not limited doing any activities at home (0) > I am very limited doing activities at home (5) 0  I am confident leaving my home despite my lung function (0) > I am not at all confident leaving my home because of my lung condition (5)  0  I sleep soundly (0) > I don't sleep soundly because of my lung condition (5) 5  I have lots of energy (0) > I have no energy at all (5) 3   Total CAT Score: 15  Most recent blood eosinophil count was 300 cells/microL March 2019.   A/P:  Findings/Recommendations:  . Based on a review of the patient's current COPD medications and symptoms, it is appropriate to de-prescribe fluticasone.  . Will initiate umeclidinium (switch from Advair to Anoro) to optimize COPD management. Counseled patient on proper use and possible side effects of the medication.  . Educated patient on proper use of inhalers, importance of medication adherence and identified other  risk factors for COPD exacerbation.  . Counseled patient to report any changes in COPD medications, breathing, or shortness of breath.  . Will obtain blood eosinophil count at today's visit. Continued appropriateness of ICS de-prescribing will be evaluated after eosinophil count value results. . Influenza and pneumococcal vaccines administered today . Other recommendations: patient also due for Tdap but deferred until follow up visit. Patient may benefit from sleep study/evaluation for sleep apnea based on sleep disturbance described in CAT quiestionnaire.  Follow up phone call due in 1 month.  An after visit summary was provided and patient advised to follow up in 3 months with Pharmacy Clinic or sooner if any changes in condition or questions regarding medications arise.   The patient verbalized understanding of information provided by repeating back concepts discussed.

## 2018-11-22 NOTE — Patient Instructions (Signed)
Patient educated about medication as defined in this encounter and verbalized understanding by repeating back instructions provided.   

## 2019-01-17 DIAGNOSIS — J449 Chronic obstructive pulmonary disease, unspecified: Secondary | ICD-10-CM | POA: Insufficient documentation

## 2019-01-17 DIAGNOSIS — Z87891 Personal history of nicotine dependence: Secondary | ICD-10-CM | POA: Insufficient documentation

## 2019-01-31 ENCOUNTER — Telehealth: Payer: Self-pay

## 2019-01-31 ENCOUNTER — Telehealth: Payer: Self-pay | Admitting: Pharmacist

## 2019-01-31 DIAGNOSIS — J441 Chronic obstructive pulmonary disease with (acute) exacerbation: Secondary | ICD-10-CM

## 2019-01-31 MED ORDER — ALBUTEROL SULFATE HFA 108 (90 BASE) MCG/ACT IN AERS: 2.0000 | INHALATION_SPRAY | RESPIRATORY_TRACT | 3 refills | Status: DC | PRN

## 2019-01-31 MED ORDER — UMECLIDINIUM-VILANTEROL 62.5-25 MCG/INH IN AEPB: 1.0000 | INHALATION_SPRAY | Freq: Every day | RESPIRATORY_TRACT | 11 refills | Status: DC

## 2019-01-31 NOTE — Telephone Encounter (Signed)
Thank you Hme, I was able to speak to patient.

## 2019-01-31 NOTE — Progress Notes (Signed)
Caitlin [name] is a 53 y.o. female who was contacted for follow up COPD management and ICS de-escalation.  No Known Allergies Medication Sig  albuterol (PROVENTIL HFA;VENTOLIN HFA) 108 (90 Base) MCG/ACT inhaler Inhale 2 puffs into the lungs every 4 (four) hours as needed for wheezing or shortness of breath.  umeclidinium-vilanterol (ANORO ELLIPTA) 62.5-25 MCG/INH AEPB Inhale 1 puff into the lungs daily.   Past Medical History:  Diagnosis Date  . CHF (congestive heart failure) ([place])   . COPD (chronic obstructive pulmonary disease) ([place])   . NSTEMI (non-ST elevated myocardial infarction) ([place])   . Takotsubo cardiomyopathy    Social History   Socioeconomic History  . Marital status: Divorced    Spouse name: Not on file  . Number of children: Not on file  . Years of education: Not on file  . Highest education level: Not on file  Occupational History  . Occupation: CNA  Social Needs  . Financial resource strain: Not on file  . Food insecurity:    Worry: Not on file    Inability: Not on file  . Transportation needs:    Medical: Not on file    Non-medical: Not on file  Tobacco Use  . Smoking status: Former Smoker    Packs/day: 1.00    Types: Cigarettes    Last attempt to quit: 08/16/2017    Years since quitting: 1.4  . Smokeless tobacco: Never Used  . Tobacco comment: quit 07/12/17  Substance and Sexual Activity  . Alcohol use: Yes    Comment: occasional alcohol  . Drug use: Not Currently    Types: Marijuana    Comment: Occasional marijuana quit 6 months ago   . Sexual activity: Yes    Birth control/protection: None  Lifestyle  . Physical activity:    Days per week: Not on file    Minutes per session: Not on file  . Stress: Not on file  Relationships  . Social connections:    Talks on phone: Not on file    Gets together: Not on file    Attends religious service: Not on file    Active member of club or organization: Not on file    Attends meetings of clubs or  organizations: Not on file    Relationship status: Not on file  Other Topics Concern  . Not on file  Social History Narrative  . Not on file   Family History  Problem Relation Age of Onset  . Heart disease Mother        age 60's  . Hypertension Mother   . Aneurysm Father        brain   O: Ht Readings from Last 2 Encounters:  05/27/18 5\' 9"  (1.753 m)  03/03/18 5\' 9"  (1.753 m)   Wt Readings from Last 2 Encounters:  05/27/18 173 lb 12.8 oz (78.8 kg)  03/03/18 164 lb 12.8 oz (74.8 kg)   There is no height or weight on file to calculate BMI.  CAT ASSESSMENT  Rank each of the following items on a scale of 0 to 5 (with 5 being most severe) Write a # 0-5 in each box  I never cough (0) > I cough all the time (5) 1  I have no phlegm (mucus) in my chest (0) > My chest is completely full of phlegm (mucus) (5) 0  My chest does not feel tight at all (0) > My chest feels very tight (5) 0  When I walk up a  hill or one flight of stairs I am not breathless (0) > When I walk up a hill or one flight of stairs I am very breathless (5) 3  I am not limited doing any activities at home (0) > I am very limited doing activities at home (5) 1  I am confident leaving my home despite my lung function (0) > I am not at all confident leaving my home because of my lung condition (5)  0  I sleep soundly (0) > I don't sleep soundly because of my lung condition (5) 1  I have lots of energy (0) > I have no energy at all (5) 3   Total CAT Score: 9  A/P: . Patient reports taking Anoro + PRN albuterol for COPD. However, she has been unable to continue taking the Anoro due to cost. Applied for [employer] patient assistance program with patient today and offered samples until application is processed. . Patient reports overall improvement in symptoms since switching from Advair to [facility] in January. Her CAT score in January was 15 and today it is 9, she reports no exacerbation since changing therapy. . Continue current  regimen of Anoro + PRN albuterol. Applied for [employer] patient assistance program with patient today and offered samples until application is processed. . The patient verbalized understanding of information provided by repeating back concepts discussed.

## 2019-01-31 NOTE — Telephone Encounter (Signed)
Returning your call. Please call pt back.

## 2019-02-01 NOTE — Telephone Encounter (Signed)
Pt here to pick up samples (ventolin, and anoro ellipta).  Pt's last visit 10/2017-pt instructed to make an appt with pcp..Pt states that she has found a new pcp [facility]) and has an upcoming appt on 03/06/19.  Pt informed that she will no longer be able to received samples from [facility] and agreed. No further action needed, phone call complete.Despina Hidden Cassady3/18/20202:29 PM

## 2019-02-08 ENCOUNTER — Telehealth: Payer: Self-pay

## 2019-02-08 NOTE — Telephone Encounter (Signed)
Works at [employer] a letter stating it is advisable due to her health problems that she not work during the [place] crisis. She will obtain a fax ph# from her employer if you write the letter for her

## 2019-02-08 NOTE — Telephone Encounter (Signed)
Requesting to a note for work. Please call pt back.

## 2019-02-09 ENCOUNTER — Other Ambulatory Visit: Payer: Self-pay | Admitting: Internal Medicine

## 2019-02-09 NOTE — Telephone Encounter (Signed)
Pt informed.  Upon arrival, CMA will print letter from epic and sign/cosign pcp's name.Despina Hidden Cassady3/26/20204:06 PM

## 2019-02-09 NOTE — Telephone Encounter (Signed)
Letter is in patient's chart. I'm unable to sign it today as I will not be on campus. If she needs it signed I'm planning to be there tomorrow and can sign it then.

## 2019-02-14 DIAGNOSIS — J432 Centrilobular emphysema: Secondary | ICD-10-CM | POA: Insufficient documentation

## 2019-02-15 ENCOUNTER — Encounter: Payer: Self-pay | Admitting: Pharmacist

## 2019-02-15 NOTE — Progress Notes (Addendum)
Caitlin [name] is a 53 y.o. female who was contacted for follow up ICS de-escalation.   Patient's current COPD medication regimen consists of: Anoro and PRN albuterol. Patient denies COPD related symptoms. Patient reports use of rescue inhaler with an estimated use at about 0-1 times per week, no increased use since ICS de-escalation.   No Known Allergies Medication Sig  albuterol (PROVENTIL HFA;VENTOLIN HFA) 108 (90 Base) MCG/ACT inhaler Inhale 2 puffs into the lungs every 4 (four) hours as needed for wheezing or shortness of breath.  umeclidinium-vilanterol (ANORO ELLIPTA) 62.5-25 MCG/INH AEPB Inhale 1 puff into the lungs daily. 90 day supply ok   Past Medical History:  Diagnosis Date  . CHF (congestive heart failure) ([place])   . COPD (chronic obstructive pulmonary disease) ([place])   . NSTEMI (non-ST elevated myocardial infarction) ([place])   . Takotsubo cardiomyopathy    Social History   Socioeconomic History  . Marital status: Divorced    Spouse name: Not on file  . Number of children: Not on file  . Years of education: Not on file  . Highest education level: Not on file  Occupational History  . Occupation: CNA  Social Needs  . Financial resource strain: Not on file  . Food insecurity:    Worry: Not on file    Inability: Not on file  . Transportation needs:    Medical: Not on file    Non-medical: Not on file  Tobacco Use  . Smoking status: Former Smoker    Packs/day: 1.00    Types: Cigarettes    Last attempt to quit: 08/16/2017    Years since quitting: 1.5  . Smokeless tobacco: Never Used  . Tobacco comment: quit 07/12/17  Substance and Sexual Activity  . Alcohol use: Yes    Comment: occasional alcohol  . Drug use: Not Currently    Types: Marijuana    Comment: Occasional marijuana quit 6 months ago   . Sexual activity: Yes    Birth control/protection: None  Lifestyle  . Physical activity:    Days per week: Not on file    Minutes per session: Not on file  .  Stress: Not on file  Relationships  . Social connections:    Talks on phone: Not on file    Gets together: Not on file    Attends religious service: Not on file    Active member of club or organization: Not on file    Attends meetings of clubs or organizations: Not on file    Relationship status: Not on file  Other Topics Concern  . Not on file  Social History Narrative  . Not on file   Family History  Problem Relation Age of Onset  . Heart disease Mother        age 16's  . Hypertension Mother   . Aneurysm Father        brain   O: Ht Readings from Last 2 Encounters:  05/27/18 5\' 9"  (1.753 m)  03/03/18 5\' 9"  (1.753 m)   Wt Readings from Last 2 Encounters:  05/27/18 173 lb 12.8 oz (78.8 kg)  03/03/18 164 lb 12.8 oz (74.8 kg)   There is no height or weight on file to calculate BMI.  CAT ASSESSMENT  Rank each of the following items on a scale of 0 to 5 (with 5 being most severe) Write a # 0-5 in each box  I never cough (0) > I cough all the time (5) 0  I have  no phlegm (mucus) in my chest (0) > My chest is completely full of phlegm (mucus) (5) 0  My chest does not feel tight at all (0) > My chest feels very tight (5) 0  When I walk up a hill or one flight of stairs I am not breathless (0) > When I walk up a hill or one flight of stairs I am very breathless (5) 3  I am not limited doing any activities at home (0) > I am very limited doing activities at home (5) 3  I am confident leaving my home despite my lung function (0) > I am not at all confident leaving my home because of my lung condition (5)  0  I sleep soundly (0) > I don't sleep soundly because of my lung condition (5) 0  I have lots of energy (0) > I have no energy at all (5) 4   Total CAT Score: 10  A/P: . No acute changes to COPD control. Patient has improved slightly compared to baseline (CAT score 15 on Jan 7). No issues or concerns today. . No therapy changes recommended at this time. . Patient was approved  for free Ventolin and Anoro through [facility] patient assistance program for 1 year. . Signing off case, will notify PCP. Marland Kitchen Counseled patient to report any changes in COPD medications, breathing, or shortness of breath.   Patient was advised to contact clinic if any changes in condition or questions regarding medications arise.   The patient verbalized understanding of information provided by repeating back concepts discussed.

## 2019-03-15 ENCOUNTER — Encounter: Payer: Self-pay | Admitting: Internal Medicine

## 2019-03-15 ENCOUNTER — Telehealth: Payer: Self-pay

## 2019-03-15 NOTE — Telephone Encounter (Signed)
Requesting a letter for work. Please call pt back.

## 2019-03-15 NOTE — Telephone Encounter (Signed)
Return pt's call - no answer; left message to call us back about work note.

## 2019-03-15 NOTE — Telephone Encounter (Signed)
Pt states she needs the letter to be out of work needs to be extended to April 17, 2019 you may call her at [phone]. [employer] [phone]

## 2019-03-15 NOTE — Telephone Encounter (Signed)
Letter will be at front desk for patient to pick it up per her request.

## 2019-04-24 ENCOUNTER — Telehealth: Payer: Self-pay | Admitting: Internal Medicine

## 2019-04-24 NOTE — Telephone Encounter (Signed)
Patient calling back about her extension letter.

## 2019-04-24 NOTE — Telephone Encounter (Signed)
Pt would like physician to extend letter keeping her out of work until oct 31; pt contact [phone]

## 2019-04-24 NOTE — Telephone Encounter (Signed)
Will call back some time this afternoon, either after my CC or in-between patients if I have time. Need to discuss why she needs a 5 month extension.

## 2019-04-24 NOTE — Telephone Encounter (Signed)
Called patient x2. No answer. Will call back tomorrow.

## 2019-04-25 ENCOUNTER — Encounter: Payer: Self-pay | Admitting: Internal Medicine

## 2019-04-25 NOTE — Telephone Encounter (Signed)
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 And wearing a mask makes it hard to breath. Also informed pt , her doctor may call her back later today.

## 2019-04-25 NOTE — Telephone Encounter (Signed)
Pt missed call, pls contact pt

## 2019-04-25 NOTE — Telephone Encounter (Signed)
Called patient back. She works as a Quarry manager for [employer] and is at increased risk of contracting COVID-19. She has been using a mask every day but states she gets very short of breath after using it for a while and has to go out of her patient's houses to take off mask and use her rescue inhaler. Has been compliant with her Anoro. Discussed will give her a letter to be out of work until July 31st. We will re-evaluate then. Left letter at front desk.

## 2019-06-13 DIAGNOSIS — D1722 Benign lipomatous neoplasm of skin and subcutaneous tissue of left arm: Secondary | ICD-10-CM | POA: Insufficient documentation

## 2020-02-27 ENCOUNTER — Telehealth (INDEPENDENT_AMBULATORY_CARE_PROVIDER_SITE_OTHER): Payer: Self-pay | Admitting: Internal Medicine

## 2020-02-27 ENCOUNTER — Encounter: Payer: Self-pay | Admitting: Internal Medicine

## 2020-02-27 DIAGNOSIS — Z7689 Persons encountering health services in other specified circumstances: Secondary | ICD-10-CM

## 2020-02-27 DIAGNOSIS — J441 Chronic obstructive pulmonary disease with (acute) exacerbation: Secondary | ICD-10-CM

## 2020-02-27 MED ORDER — ANORO ELLIPTA 62.5-25 MCG/INH IN AEPB: 1.0000 | INHALATION_SPRAY | Freq: Every day | RESPIRATORY_TRACT | 2 refills | Status: DC

## 2020-02-27 MED ORDER — ALBUTEROL SULFATE HFA 108 (90 BASE) MCG/ACT IN AERS: 2.0000 | INHALATION_SPRAY | RESPIRATORY_TRACT | 3 refills | Status: DC | PRN

## 2020-02-27 NOTE — Progress Notes (Signed)
Virtual Visit via Telephone Note  I connected with Modine Oppenheimer, on 02/27/2020 at 9:32 AM by telephone due to the COVID-19 pandemic and verified that I am speaking with the correct person using two identifiers.   Consent: I discussed the limitations, risks, security and privacy concerns of performing an evaluation and management service by telephone and the availability of in person appointments. I also discussed with the patient that there may be a patient responsible charge related to this service. The patient expressed understanding and agreed to proceed.   Location of Patient: Home   Location of Provider: Clinic    Persons participating in Telemedicine visit: Akylah Hascall [facility] Dr. Juleen China      History of Present Illness: Patient has a visit to establish care. Patient has a PMH of COPD. She is currently taking Anoro and Albuterol prn.   Reports that she has SOB with exertion and with position changes. She reports that she can not climb stairs at all. Doing things makes her tired/feel weak.   Patient has not been able to see pulmonologist because of out of network insurance. She uses Albuterol typically more in the evenings because she doesn't like to use it around the same time she takes the Anoro.   Patient reports she was diagnosed with CHF about 3 years ago when she was hospitalized and very ill. At that time, she was hospitalized with a severe COPD exacerbation and ended up with a temporary tracheostomy. She was hospitalized for a total of 2.5 months. Since then she followed up with cardiology outpatient and told she her heart was fine.     Past Medical History:  Diagnosis Date  . CHF (congestive heart failure) ([place])   . COPD (chronic obstructive pulmonary disease) ([place])   . NSTEMI (non-ST elevated myocardial infarction) ([place])   . Takotsubo cardiomyopathy    No Known Allergies  Current Outpatient Medications on File Prior to Visit   Medication Sig Dispense Refill  . albuterol (PROVENTIL HFA;VENTOLIN HFA) 108 (90 Base) MCG/ACT inhaler Inhale 2 puffs into the lungs every 4 (four) hours as needed for wheezing or shortness of breath. 1 Inhaler 3  . umeclidinium-vilanterol (ANORO ELLIPTA) 62.5-25 MCG/INH AEPB Inhale 1 puff into the lungs daily. 90 day supply ok 60 each 11   No current facility-administered medications on file prior to visit.    Observations/Objective: NAD. Speaking clearly.  Work of breathing normal.  Alert and oriented. Mood appropriate.   Assessment and Plan: 1. Encounter to establish care  2. COPD without acute exacerbation ([place]) Patient seems to be having chronic worsening of her COPD symptoms. She does not appear to be having an acute exacerbation as she does not have increased sputum production or change in the nature of her SOB. Patient is already using inhaled LAMA+LABA combination and reports compliance. Therefore, would like her to be seen by Dr. Joya Gaskins for evaluation. She does have a h/o CHF that sounds to be related to acute decompensation from a hospitalization for severe COPD exacerbation. Last echo Oct 2018 shows EF 55-60% which had improved from prior values. Certainly these symptoms could also be related to CHF. Would consider repeat echo pending pulm evaluation.  - albuterol (VENTOLIN HFA) 108 (90 Base) MCG/ACT inhaler; Inhale 2 puffs into the lungs every 4 (four) hours as needed for wheezing or shortness of breath.  Dispense: 18 g; Refill: 3 - umeclidinium-vilanterol (ANORO ELLIPTA) 62.5-25 MCG/INH AEPB; Inhale 1 puff into the lungs daily. 90 day  supply ok  Dispense: 60 each; Refill: 2   Follow Up Instructions: Dr. Joya Gaskins 5/5, Schedule for annual with PAP    I discussed the assessment and treatment plan with the patient. The patient was provided an opportunity to ask questions and all were answered. The patient agreed with the plan and demonstrated an understanding of the instructions.    The patient was advised to call back or seek an in-person evaluation if the symptoms worsen or if the condition fails to improve as anticipated.     I provided 18 minutes total of non-face-to-face time during this encounter including median intraservice time, reviewing previous notes, investigations, ordering medications, medical decision making, coordinating care and patient verbalized understanding at the end of the visit.    Phill Myron, D.O. Primary Care at [facility]  02/27/2020, 9:32 AM

## 2020-03-20 ENCOUNTER — Encounter: Payer: Self-pay | Admitting: Critical Care Medicine

## 2020-03-20 ENCOUNTER — Other Ambulatory Visit: Payer: Self-pay

## 2020-03-20 ENCOUNTER — Telehealth: Payer: Self-pay | Admitting: Critical Care Medicine

## 2020-03-20 ENCOUNTER — Ambulatory Visit (HOSPITAL_COMMUNITY)
Admission: RE | Admit: 2020-03-20 | Discharge: 2020-03-20 | Disposition: A | Discharge status: Home / Self Care | Payer: 59 | Source: Ambulatory Visit | Attending: Critical Care Medicine | Admitting: Critical Care Medicine

## 2020-03-20 ENCOUNTER — Ambulatory Visit: Payer: 59 | Attending: Critical Care Medicine | Admitting: Critical Care Medicine

## 2020-03-20 VITALS — BP 115/83 | HR 66 | Temp 97.8°F | Resp 16 | Ht 69.0 in | Wt 200.0 lb

## 2020-03-20 DIAGNOSIS — Z01812 Encounter for preprocedural laboratory examination: Secondary | ICD-10-CM

## 2020-03-20 DIAGNOSIS — J441 Chronic obstructive pulmonary disease with (acute) exacerbation: Secondary | ICD-10-CM

## 2020-03-20 DIAGNOSIS — J432 Centrilobular emphysema: Secondary | ICD-10-CM

## 2020-03-20 DIAGNOSIS — Z87891 Personal history of nicotine dependence: Secondary | ICD-10-CM

## 2020-03-20 DIAGNOSIS — Z20822 Contact with and (suspected) exposure to covid-19: Secondary | ICD-10-CM

## 2020-03-20 DIAGNOSIS — J449 Chronic obstructive pulmonary disease, unspecified: Secondary | ICD-10-CM

## 2020-03-20 MED ORDER — FLOVENT HFA 220 MCG/ACT IN AERO: 2.0000 | INHALATION_SPRAY | Freq: Two times a day (BID) | RESPIRATORY_TRACT | 12 refills | Status: DC

## 2020-03-20 MED ORDER — PREDNISONE 10 MG PO TABS: ORAL_TABLET | ORAL | 0 refills | Status: DC

## 2020-03-20 NOTE — Assessment & Plan Note (Signed)
There is a severe reduction in DLCO in the March 2020 study therefore it is possible this patient has emphysematous component

## 2020-03-20 NOTE — Progress Notes (Signed)
Subjective:    Patient ID: Caitlin [name], female    DOB: Apr 03, 1966, 54 y.o.   MRN: [id number]  54 y.o.F ref by PCP for Copd evaluation This patient first recognized she had advanced COPD when she had pneumonia and respiratory failure in 2018 at that time she required chronic vent support and tracheostomy tube.  Ultimately she was weaned from mechanical ventilation and the trach was decannulated.  She is now currently off oxygen therapy.  She has been on Anoro 1 inhalation daily for the past 2 years.  Despite this she is getting progressively worse with increased dyspnea on exertion.  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  The patient denies much in the way of cough.  The symptoms are primarily dyspnea with exertion.  See shortness of breath assessment below  Shortness of Breath This is a chronic problem. The current episode started more than 1 year ago. The problem occurs constantly (up stairs, any exertion and has to use inhaler , bend over, cannot lift 10- #). The problem has been gradually worsening. Associated symptoms include ear pain, headaches, leg swelling, orthopnea, PND and wheezing. Pertinent negatives include no chest pain, hemoptysis, leg pain, rash, rhinorrhea, sore throat, sputum production, swollen glands or vomiting. The symptoms are aggravated by exercise, any activity, lying flat, odors, fumes and weather changes. Associated symptoms comments: Cough is tired or dif exertional. Risk factors include smoking. She has tried beta agonist inhalers, ipratropium inhalers and oral steroids for the symptoms. The treatment provided moderate relief. Her past medical history is significant for COPD.    Past Medical History:  Diagnosis Date  . CHF (congestive heart failure) ([place])   . COPD (chronic obstructive pulmonary disease) ([place])   . NSTEMI (non-ST elevated myocardial infarction) ([place])   . Takotsubo  cardiomyopathy      Family History  Problem Relation Age of Onset  . Heart disease Mother        age 9's  . Hypertension Mother   . Aneurysm Father        brain     Social History   Socioeconomic History  . Marital status: Divorced    Spouse name: Not on file  . Number of children: Not on file  . Years of education: Not on file  . Highest education level: Not on file  Occupational History  . Occupation: CNA  Tobacco Use  . Smoking status: Former Smoker    Packs/day: 1.00    Types: Cigarettes    Quit date: 08/16/2017    Years since quitting: 2.5  . Smokeless tobacco: Never Used  . Tobacco comment: quit 07/12/17  Substance and Sexual Activity  . Alcohol use: Yes    Comment: occasional alcohol  . Drug use: Not Currently    Types: Marijuana    Comment: Occasional marijuana quit 6 months ago   . Sexual activity: Yes    Birth control/protection: None  Other Topics Concern  . Not on file  Social History Narrative  . Not on file   Social Determinants of Health   Financial Resource Strain:   . Difficulty of Paying Living Expenses:   Food Insecurity:   . Worried About Charity fundraiser in the Last Year:   . Arboriculturist in the Last Year:   Transportation Needs:   . Film/video editor (Medical):   Marland Kitchen Lack of Transportation (Non-Medical):  Physical Activity:   . Days of Exercise per Week:   . Minutes of Exercise per Session:   Stress:   . Feeling of Stress :   Social Connections:   . Frequency of Communication with Friends and Family:   . Frequency of Social Gatherings with Friends and Family:   . Attends Religious Services:   . Active Member of Clubs or Organizations:   . Attends Archivist Meetings:   Marland Kitchen Marital Status:   Intimate Partner Violence:   . Fear of Current or Ex-Partner:   . Emotionally Abused:   Marland Kitchen Physically Abused:   . Sexually Abused:      No Known Allergies   Outpatient Medications Prior to Visit  Medication Sig  Dispense Refill  . albuterol (VENTOLIN HFA) 108 (90 Base) MCG/ACT inhaler Inhale 2 puffs into the lungs every 4 (four) hours as needed for wheezing or shortness of breath. 18 g 3  . umeclidinium-vilanterol (ANORO ELLIPTA) 62.5-25 MCG/INH AEPB Inhale 1 puff into the lungs daily. 90 day supply ok 60 each 2   No facility-administered medications prior to visit.     Review of Systems  HENT: Positive for ear pain. Negative for rhinorrhea and sore throat.   Respiratory: Positive for shortness of breath and wheezing. Negative for hemoptysis and sputum production.   Cardiovascular: Positive for orthopnea, leg swelling and PND. Negative for chest pain.  Gastrointestinal: Negative for vomiting.  Skin: Negative for rash.  Neurological: Positive for headaches.       Objective:   Physical Exam Vitals:   03/20/20 0840  BP: 115/83  Pulse: 66  Resp: 16  Temp: 97.8 F (36.6 C)  SpO2: 97%  Weight: 200 lb (90.7 kg)  Height: 5\' 9"  (1.753 m)    Gen: Pleasant, well-nourished, in no distress,  normal affect  ENT: No lesions,  mouth clear,  oropharynx clear, no postnasal drip  Neck: No JVD, no TMG, no carotid bruits, old tracheotomy site seen well-healed  Lungs: No use of accessory muscles, no dullness to percussion, distant breath sounds without wheeze or rhonchi  Cardiovascular: RRR, heart sounds normal, no murmur or gallops, no peripheral edema  Abdomen: soft and NT, no HSM,  BS normal  Musculoskeletal: No deformities, no cyanosis or clubbing  Neuro: alert, non focal  Skin: Warm, no lesions or rashes  DG Chest 2 View  Result Date: 03/20/2020 CLINICAL DATA:  Patient has h/o COPD for about 7 years, trach placement 2 years ago. F/u films for COPD, no chest complaints today. EXAM: CHEST - 2 VIEW COMPARISON:  02/04/2018 FINDINGS: The cardiac silhouette is normal in size. No mediastinal or hilar masses or evidence of adenopathy. Lungs are clear.  No pleural effusion or pneumothorax. Skeletal  structures are intact. No change from the prior study. IMPRESSION: No active cardiopulmonary disease. Electronically Signed   By: Lajean Manes M.D.   On: 03/20/2020 15:53    Ambulatory pulse ox shows to be normal on room air after 2 laps around the office above 95%      Assessment & Plan:  I personally reviewed all images and lab data in the [facility] system as well as any outside material available during this office visit and agree with the  radiology impressions.   Chronic obstructive pulmonary disease (HCC) Chronic obstructive lung disease without recent pulmonary function studies currently on Anoro only  Will pulse prednisone and begin an inhaled steroid.  Initially wanted Flovent 2 elations twice daily however the patient's insurance has  a co-pay of $200 for this medication  In the process of getting this patient's insurance runs to see if we can get an inhaled steroid for Lasix otherwise we may need to look at patient assistance  In the interim we will prescribe pulse prednisone 40 mg a day for 5 days  No chest x-ray showed no active disease process and therefore CT scan of the chest is not indicated  I have also ordered a pulmonary function study for this patient as well  Note patient's room air oxygenation is good and ambulatory pulse ox was normal therefore oxygen therapy is not indicated  Stage 2 moderate COPD by GOLD classification ([place]) Prior pulmonary function studies did demonstrate in March 2020 FEV1 of 61% FVC of 75% with a reduction in FEV1 to FVC ratio  I am interested in repeating pulmonary functions at this time to see if there is disease progression  Centrilobular emphysema ([place]) There is a severe reduction in DLCO in the March 2020 study therefore it is possible this patient has emphysematous component  Stopped smoking with greater than 30 pack year history The patient is an ex-smoker   Aleza was seen today for copd.  Diagnoses and all orders for this  visit:  COPD exacerbation ([place]) -     Pulmonary Function Test; Future -     DG Chest 2 View; Future  Encounter for preoperative screening laboratory testing for COVID-19 virus -     Novel Coronavirus, NAA ([facility])  Chronic obstructive pulmonary disease with acute exacerbation ([place])  Stage 2 moderate COPD by GOLD classification ([place])  Centrilobular emphysema ([place])  Stopped smoking with greater than 30 pack year history  Other orders -     fluticasone (FLOVENT HFA) 220 MCG/ACT inhaler; Inhale 2 puffs into the lungs in the morning and at bedtime. -     predniSONE (DELTASONE) 10 MG tablet; Take 4 tablets daily for 5 days then stop   I did review the proper use of a dry powdered inhaler

## 2020-03-20 NOTE — Telephone Encounter (Signed)
Patient called in to inform pcp that the fluticasone (FLOVENT HFA) 220 MCG/ACT inhaler [[id number]]  Will cost her $200 out of pocket. Patient stated that she is unable to afford that currently and wondered if she could be prescribed something else. Please follow up at your earliest convenience.

## 2020-03-20 NOTE — Assessment & Plan Note (Signed)
Chronic obstructive lung disease without recent pulmonary function studies currently on Anoro only  Will pulse prednisone and begin an inhaled steroid.  Initially wanted Flovent 2 elations twice daily however the patient's insurance has a co-pay of $200 for this medication  In the process of getting this patient's insurance runs to see if we can get an inhaled steroid for Lasix otherwise we may need to look at patient assistance  In the interim we will prescribe pulse prednisone 40 mg a day for 5 days  No chest x-ray showed no active disease process and therefore CT scan of the chest is not indicated  I have also ordered a pulmonary function study for this patient as well  Note patient's room air oxygenation is good and ambulatory pulse ox was normal therefore oxygen therapy is not indicated

## 2020-03-20 NOTE — Telephone Encounter (Signed)
Caitlin [name]  Can we see what other inhaled steroid will cost less on [employer]  The epic says it is covered however I find that not reliable

## 2020-03-20 NOTE — Assessment & Plan Note (Signed)
The patient is an ex-smoker

## 2020-03-20 NOTE — Assessment & Plan Note (Signed)
Prior pulmonary function studies did demonstrate in March 2020 FEV1 of 61% FVC of 75% with a reduction in FEV1 to FVC ratio  I am interested in repeating pulmonary functions at this time to see if there is disease progression

## 2020-03-20 NOTE — Patient Instructions (Signed)
Start prednisone Take 4 tablets daily for 5 days then stop Start Flovent two puff twice a day Stay on Anoro  Obtain a lung function test , will need Covid test before  Obtain Chest xray at [place]  See Dr Joya Gaskins in 6 weeks video visit

## 2020-03-20 NOTE — Progress Notes (Signed)
Here for COPD f /u

## 2020-03-21 NOTE — Telephone Encounter (Signed)
Per [employer] is is 'covered', but Tier 3 and all the inhalers in this class are $200 copay.  There is a  One time free trial coupon available online for Asmanex Twisthaler or Asmanex HFA, but then it is a $200 copay after that.  [facility] has a  Coupon that takes her copay down to $170.47.  If appropriate, a [place] coupon that would be emailed to the pt, may bring Anoro copay to $50.  Everything else I found was $130-$200

## 2020-03-21 NOTE — Telephone Encounter (Signed)
No good options,  I called pt and left msg and asked her to take the prednisone I sent and return in follow up and hold on buying steroid inhaler

## 2020-03-28 ENCOUNTER — Telehealth: Payer: Self-pay

## 2020-03-28 NOTE — Telephone Encounter (Signed)
Called patient to do their pre-visit COVID screening.  Call went to voicemail. Unable to do prescreening.  

## 2020-03-29 ENCOUNTER — Ambulatory Visit: Payer: Medicaid Other | Admitting: Internal Medicine

## 2020-04-06 ENCOUNTER — Other Ambulatory Visit (HOSPITAL_COMMUNITY)
Admission: RE | Admit: 2020-04-06 | Discharge: 2020-04-06 | Disposition: A | Discharge status: Home / Self Care | Payer: 59 | Source: Ambulatory Visit | Attending: Critical Care Medicine | Admitting: Critical Care Medicine

## 2020-04-06 DIAGNOSIS — Z01812 Encounter for preprocedural laboratory examination: Secondary | ICD-10-CM | POA: Insufficient documentation

## 2020-04-06 DIAGNOSIS — Z20822 Contact with and (suspected) exposure to covid-19: Secondary | ICD-10-CM | POA: Insufficient documentation

## 2020-04-06 LAB — SARS CORONAVIRUS 2 (TAT 6-24 HRS): SARS Coronavirus 2: NEGATIVE

## 2020-04-08 ENCOUNTER — Ambulatory Visit (HOSPITAL_COMMUNITY)
Admission: RE | Admit: 2020-04-08 | Discharge: 2020-04-08 | Disposition: A | Discharge status: Home / Self Care | Payer: 59 | Source: Ambulatory Visit | Attending: Critical Care Medicine | Admitting: Critical Care Medicine

## 2020-04-08 ENCOUNTER — Other Ambulatory Visit: Payer: Self-pay

## 2020-04-08 ENCOUNTER — Telehealth: Payer: Self-pay

## 2020-04-08 DIAGNOSIS — J441 Chronic obstructive pulmonary disease with (acute) exacerbation: Secondary | ICD-10-CM | POA: Insufficient documentation

## 2020-04-08 LAB — PULMONARY FUNCTION TEST
DL/VA % pred: 66 %
DL/VA: 2.73 ml/min/mmHg/L
DLCO unc % pred: 58 %
DLCO unc: 14.17 ml/min/mmHg
FEF 25-75 Post: 1.33 L/sec
FEF 25-75 Pre: 1.17 L/sec
FEF2575-%Change-Post: 14 %
FEF2575-%Pred-Post: 51 %
FEF2575-%Pred-Pre: 45 %
FEV1-%Change-Post: 3 %
FEV1-%Pred-Post: 85 %
FEV1-%Pred-Pre: 82 %
FEV1-Post: 2.28 L
FEV1-Pre: 2.19 L
FEV1FVC-%Change-Post: -1 %
FEV1FVC-%Pred-Pre: 77 %
FEV6-%Change-Post: 4 %
FEV6-%Pred-Post: 108 %
FEV6-%Pred-Pre: 104 %
FEV6-Post: 3.54 L
FEV6-Pre: 3.4 L
FEV6FVC-%Change-Post: -1 %
FEV6FVC-%Pred-Post: 97 %
FEV6FVC-%Pred-Pre: 99 %
FVC-%Change-Post: 5 %
FVC-%Pred-Post: 111 %
FVC-%Pred-Pre: 105 %
FVC-Post: 3.71 L
FVC-Pre: 3.51 L
Post FEV1/FVC ratio: 61 %
Post FEV6/FVC ratio: 95 %
Pre FEV1/FVC ratio: 62 %
Pre FEV6/FVC Ratio: 97 %
RV % pred: 124 %
RV: 2.68 L
TLC % pred: 106 %
TLC: 6.16 L

## 2020-04-08 MED ORDER — ALBUTEROL SULFATE (2.5 MG/3ML) 0.083% IN NEBU
2.5000 mg | INHALATION_SOLUTION | Freq: Once | RESPIRATORY_TRACT | Status: AC
  Administered 2020-04-08: 2.5 mg via RESPIRATORY_TRACT

## 2020-04-08 NOTE — Telephone Encounter (Signed)
Created in error

## 2020-04-09 ENCOUNTER — Telehealth: Payer: Self-pay | Admitting: Critical Care Medicine

## 2020-04-09 DIAGNOSIS — J441 Chronic obstructive pulmonary disease with (acute) exacerbation: Secondary | ICD-10-CM

## 2020-04-09 MED ORDER — ALBUTEROL SULFATE HFA 108 (90 BASE) MCG/ACT IN AERS: 2.0000 | INHALATION_SPRAY | RESPIRATORY_TRACT | 3 refills | Status: DC | PRN

## 2020-04-09 NOTE — Telephone Encounter (Signed)
Rx sent 

## 2020-04-09 NOTE — Telephone Encounter (Signed)
Patient called in and requested for inhaler. Patient stated that she was denied due to script having no diagnosis code.  albuterol (VENTOLIN HFA) 108 (90 Base) MCG/ACT inhaler [[id number]]  [id number] - , [place]  [address], [place] [id number]

## 2020-04-09 NOTE — Telephone Encounter (Signed)
Patient was called, verified by 2 patient identifiers, message was given to the patient. Patient verbalized understanding and had no further questions or concerns at this time.

## 2020-04-12 ENCOUNTER — Telehealth: Payer: Self-pay

## 2020-04-12 NOTE — Patient Instructions (Signed)

## 2020-04-12 NOTE — Telephone Encounter (Signed)
Called patient to do their pre-visit COVID screening.  Call went to voicemail. Unable to do prescreening.  

## 2020-04-16 ENCOUNTER — Encounter: Payer: Self-pay | Admitting: Gastroenterology

## 2020-04-16 ENCOUNTER — Telehealth: Payer: Self-pay

## 2020-04-16 ENCOUNTER — Ambulatory Visit (INDEPENDENT_AMBULATORY_CARE_PROVIDER_SITE_OTHER): Payer: 59 | Admitting: Internal Medicine

## 2020-04-16 ENCOUNTER — Other Ambulatory Visit: Payer: Self-pay

## 2020-04-16 VITALS — BP 116/74 | HR 65 | Temp 97.2°F | Resp 17 | Ht 69.0 in | Wt 201.0 lb

## 2020-04-16 DIAGNOSIS — Z23 Encounter for immunization: Secondary | ICD-10-CM

## 2020-04-16 DIAGNOSIS — Z0000 Encounter for general adult medical examination without abnormal findings: Secondary | ICD-10-CM

## 2020-04-16 DIAGNOSIS — Z1211 Encounter for screening for malignant neoplasm of colon: Secondary | ICD-10-CM | POA: Diagnosis not present

## 2020-04-16 DIAGNOSIS — Z1231 Encounter for screening mammogram for malignant neoplasm of breast: Secondary | ICD-10-CM | POA: Diagnosis not present

## 2020-04-16 NOTE — Progress Notes (Signed)
  Subjective:    Caitlin [name] - 54 y.o. female MRN [id number]  Date of birth: 01-28-1966  HPI  [place] is here for annual exam. No concerns. She is working with Dr. Joya Gaskins to try to obtain another inhaler for her COPD. Has a follow up visit scheduled for later this month.      Health Maintenance:  Health Maintenance Due  Topic Date Due  . COLONOSCOPY  Never done  . COLON CANCER SCREENING ANNUAL FOBT  03/04/2019  . MAMMOGRAM  03/03/2020    -  reports that she quit smoking about 2 years ago. Her smoking use included cigarettes. She smoked 1.00 pack per day. She has never used smokeless tobacco. - Review of Systems: Per HPI. - Past Medical History: Patient Active Problem List   Diagnosis Date Noted  . Lipoma of left axilla 06/13/2019  . Centrilobular emphysema ([place]) 02/14/2019  . Stopped smoking with greater than 30 pack year history 01/17/2019  . Stage 2 moderate COPD by GOLD classification ([place]) 01/17/2019  . Chronic obstructive pulmonary disease ([place])   . Takotsubo cardiomyopathy    - Medications: reviewed and updated   Objective:   Physical Exam BP 116/74   Pulse 65   Temp (!) 97.2 F (36.2 C) (Temporal)   Resp 17   Ht 5\' 9"  (1.753 m)   Wt 201 lb (91.2 kg)   LMP 09/09/2014 (Approximate)   SpO2 98%   BMI 29.68 kg/m  Physical Exam  Constitutional: She is oriented to person, place, and time and well-developed, well-nourished, and in no distress.  HENT:  Head: Normocephalic and atraumatic.  Mouth/Throat: Oropharynx is clear and moist.  TMs normal bilaterally   Eyes: Pupils are equal, round, and reactive to light. Conjunctivae and EOM are normal.  Neck: No thyromegaly present.  Cardiovascular: Normal rate, regular rhythm, normal heart sounds and intact distal pulses.  No murmur heard. Pulmonary/Chest: Effort normal and breath sounds normal. No respiratory distress. She has no wheezes.  Abdominal: Soft. Bowel sounds are normal. She exhibits no  distension. There is no abdominal tenderness. There is no rebound and no guarding.  Musculoskeletal:        General: No deformity or edema. Normal range of motion.     Cervical back: Normal range of motion and neck supple.  Lymphadenopathy:    She has no cervical adenopathy.  Neurological: She is alert and oriented to person, place, and time. Gait normal.  Skin: Skin is warm and dry. No rash noted. She is not diaphoretic.  Psychiatric: Mood, affect and judgment normal.           Assessment & Plan:   1. Annual physical exam Counseled on 150 minutes of exercise per week, healthy eating (including decreased daily intake of saturated fats, cholesterol, added sugars, sodium), STI prevention, routine healthcare maintenance. - CBC with Differential - Comprehensive metabolic panel - Lipid Panel  2. Breast cancer screening by mammogram - MM Digital Screening; Future  3. Colon cancer screening - Ambulatory referral to Gastroenterology  4. Need for Tdap vaccination - Tdap vaccine greater than or equal to 7yo IM    Caitlin [name], D.O. 04/16/2020, 10:49 AM Primary Care at [facility] [place]

## 2020-04-16 NOTE — Telephone Encounter (Signed)
Unfortunately there are no alternatives on her plan.   I have tried them all and then tried to appeal and they denied it.  Let me chk with Caitlin [name]: can you run this pts drug plan  Are there any less expensive steroid inhalers they cover,  I was not able to get flovent, qvar alvesco or asmanex inhaler for her with her [employer]

## 2020-04-16 NOTE — Telephone Encounter (Signed)
Patient was in office today for her CPE.  She states that she was unable to pick up Flovent inhaler due to the cost of medication. I did check [employer]'s preferred drug list & it is a Tier 3 drug. Asmanex is also Tier 3 and it doesn't look like they cover Qvar.  Is there an alternative you could prescribe?

## 2020-04-17 ENCOUNTER — Encounter: Payer: Self-pay | Admitting: Internal Medicine

## 2020-04-17 ENCOUNTER — Other Ambulatory Visit: Payer: Self-pay | Admitting: Internal Medicine

## 2020-04-17 DIAGNOSIS — I252 Old myocardial infarction: Secondary | ICD-10-CM | POA: Insufficient documentation

## 2020-04-17 LAB — COMPREHENSIVE METABOLIC PANEL
ALT: 13 IU/L (ref 0–32)
AST: 17 IU/L (ref 0–40)
Albumin/Globulin Ratio: 1.5 (ref 1.2–2.2)
Albumin: 4.3 g/dL (ref 3.8–4.9)
Alkaline Phosphatase: 75 IU/L (ref 48–121)
BUN/Creatinine Ratio: 18 (ref 9–23)
BUN: 13 mg/dL (ref 6–24)
Bilirubin Total: 0.2 mg/dL (ref 0.0–1.2)
CO2: 23 mmol/L (ref 20–29)
Calcium: 9.7 mg/dL (ref 8.7–10.2)
Chloride: 108 mmol/L — ABNORMAL HIGH (ref 96–106)
Creatinine, Ser: 0.72 mg/dL (ref 0.57–1.00)
GFR calc Af Amer: 108 mL/min/{1.73_m2} (ref >59)
GFR calc non Af Amer: 94 mL/min/{1.73_m2} (ref >59)
Globulin, Total: 2.8 g/dL (ref 1.5–4.5)
Glucose: 81 mg/dL (ref 65–99)
Potassium: 5.3 mmol/L — ABNORMAL HIGH (ref 3.5–5.2)
Sodium: 143 mmol/L (ref 134–144)
Total Protein: 7.1 g/dL (ref 6.0–8.5)

## 2020-04-17 LAB — CBC WITH DIFFERENTIAL/PLATELET
Basophils Absolute: 0 10*3/uL (ref 0.0–0.2)
Basos: 1 %
EOS (ABSOLUTE): 0.4 10*3/uL (ref 0.0–0.4)
Eos: 7 %
Hematocrit: 43.1 % (ref 34.0–46.6)
Hemoglobin: 14.1 g/dL (ref 11.1–15.9)
Immature Grans (Abs): 0 10*3/uL (ref 0.0–0.1)
Immature Granulocytes: 0 %
Lymphocytes Absolute: 3 10*3/uL (ref 0.7–3.1)
Lymphs: 54 %
MCH: 28.5 pg (ref 26.6–33.0)
MCHC: 32.7 g/dL (ref 31.5–35.7)
MCV: 87 fL (ref 79–97)
Monocytes Absolute: 0.3 10*3/uL (ref 0.1–0.9)
Monocytes: 6 %
Neutrophils Absolute: 1.7 10*3/uL (ref 1.4–7.0)
Neutrophils: 32 %
Platelets: 346 10*3/uL (ref 150–450)
RBC: 4.94 x10E6/uL (ref 3.77–5.28)
RDW: 14 % (ref 11.7–15.4)
WBC: 5.4 10*3/uL (ref 3.4–10.8)

## 2020-04-17 LAB — LIPID PANEL
Chol/HDL Ratio: 3.9 ratio (ref 0.0–4.4)
Cholesterol, Total: 203 mg/dL — ABNORMAL HIGH (ref 100–199)
HDL: 52 mg/dL (ref >39)
LDL Chol Calc (NIH): 134 mg/dL — ABNORMAL HIGH (ref 0–99)
Triglycerides: 95 mg/dL (ref 0–149)
VLDL Cholesterol Cal: 17 mg/dL (ref 5–40)

## 2020-04-17 MED ORDER — ATORVASTATIN CALCIUM 80 MG PO TABS: 80.0000 mg | ORAL_TABLET | Freq: Every day | ORAL | 3 refills | Status: DC

## 2020-04-17 MED ORDER — ASPIRIN 81 MG PO TBEC: 81.0000 mg | DELAYED_RELEASE_TABLET | Freq: Every day | ORAL | 12 refills | Status: DC

## 2020-04-19 ENCOUNTER — Telehealth: Payer: Self-pay | Admitting: Internal Medicine

## 2020-04-19 NOTE — Telephone Encounter (Signed)
Pt returned your call please call her back

## 2020-04-19 NOTE — Progress Notes (Signed)
Patient notified of results & recommendations. Expressed understanding. Is agreeable to starting Statin & baby aspirin.

## 2020-04-30 ENCOUNTER — Telehealth: Payer: Self-pay | Admitting: Internal Medicine

## 2020-04-30 NOTE — Telephone Encounter (Signed)
Lvm for pt to call me back

## 2020-04-30 NOTE — Telephone Encounter (Signed)
Pt stated that since she started the new medication she has had a headache from hell

## 2020-04-30 NOTE — Telephone Encounter (Signed)
If she is having a severe headache she needs to be evaluated(urgent care or ED)

## 2020-05-01 ENCOUNTER — Encounter: Payer: Self-pay | Admitting: Critical Care Medicine

## 2020-05-01 ENCOUNTER — Encounter (HOSPITAL_COMMUNITY): Payer: Self-pay | Admitting: *Deleted

## 2020-05-01 ENCOUNTER — Other Ambulatory Visit: Payer: Self-pay

## 2020-05-01 ENCOUNTER — Ambulatory Visit: Payer: 59 | Attending: Critical Care Medicine | Admitting: Critical Care Medicine

## 2020-05-01 DIAGNOSIS — J432 Centrilobular emphysema: Secondary | ICD-10-CM

## 2020-05-01 DIAGNOSIS — I252 Old myocardial infarction: Secondary | ICD-10-CM | POA: Diagnosis not present

## 2020-05-01 DIAGNOSIS — J449 Chronic obstructive pulmonary disease, unspecified: Secondary | ICD-10-CM | POA: Diagnosis not present

## 2020-05-01 NOTE — Progress Notes (Signed)
Subjective:    Patient ID: Caitlin [name], female    DOB: 06/16/1966, 54 y.o.   MRN: [id number] Virtual Visit via Telephone Note  I connected with Caitlin [name] on 05/01/20 at  9:00 AM EDT by telephone and verified that I am speaking with the correct person using two identifiers.   Consent:  I discussed the limitations, risks, security and privacy concerns of performing an evaluation and management service by telephone and the availability of in person appointments. I also discussed with the patient that there may be a patient responsible charge related to this service. The patient expressed understanding and agreed to proceed.  Location of patient: The patient was at home  Location of provider: I was in the office  Persons participating in the televisit with the patient.    No one else on the call   History of Present Illness: 54 y.o.F ref by PCP for Copd evaluation This patient first recognized she had advanced COPD when she had pneumonia and respiratory failure in 2018 at that time she required chronic vent support and tracheostomy tube.  Ultimately she was weaned from mechanical ventilation and the trach was decannulated.  She is now currently off oxygen therapy.  She has been on Anoro 1 inhalation daily for the past 2 years.  Despite this she is getting progressively worse with increased dyspnea on exertion.  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  The patient denies much in the way of cough.  The symptoms are primarily dyspnea with exertion.  See shortness of breath assessment below  05/01/2020 Patient is seen in return follow-up and this is a telephone visit as she was not able to achieve a video visit.  The patient recent pulmonary functions in her FEV1 is now improved to 82% predicted from 61% predicted in 2020.  Her FEF 2575 was at 45%.  Residual volume 124% predicted and diffusion capacity of 58%  predicted.  This is consistent with stage I COPD and she does states she is improved on the Anoro.  We were unable to get her inhaled steroid from her insurance company.  The patient's not on oxygen therapy at this time.  She was started on atorvastatin and low-dose aspirin for hyperlipidemia and she has had increased headache and some emesis on the atorvastatin she is holding it at this time.  Note her chest x-ray in May was normal.  Her dyspnea is at baseline now. The patient has stopped smoking at this time.  Shortness of Breath This is a chronic problem. The current episode started more than 1 year ago. The problem occurs constantly (up stairs, any exertion and has to use inhaler , bend over, cannot lift 10- #). The problem has been rapidly improving. Associated symptoms include headaches. Pertinent negatives include no chest pain, ear pain, hemoptysis, leg pain, leg swelling, orthopnea, PND, rash, rhinorrhea, sore throat, sputum production, swollen glands, vomiting or wheezing. The symptoms are aggravated by exercise, any activity, lying flat, odors, fumes and weather changes. Associated symptoms comments: Cough is tired or dif exertional. Risk factors include smoking. She has tried beta agonist inhalers, ipratropium inhalers and oral steroids for the symptoms. The treatment provided moderate relief. Her past medical history is significant for COPD.    Past Medical History:  Diagnosis Date  . CHF (congestive heart failure) ([place])   . COPD (chronic obstructive pulmonary disease) ([place])   .  NSTEMI (non-ST elevated myocardial infarction) ([place])   . Takotsubo cardiomyopathy      Family History  Problem Relation Age of Onset  . Heart disease Mother        age 74's  . Hypertension Mother   . Aneurysm Father        brain     Social History   Socioeconomic History  . Marital status: Divorced    Spouse name: Not on file  . Number of children: Not on file  . Years of education: Not on file    . Highest education level: Not on file  Occupational History  . Occupation: CNA  Tobacco Use  . Smoking status: Former Smoker    Packs/day: 1.00    Types: Cigarettes    Quit date: 08/16/2017    Years since quitting: 2.7  . Smokeless tobacco: Never Used  . Tobacco comment: quit 07/12/17  Vaping Use  . Vaping Use: Never used  Substance and Sexual Activity  . Alcohol use: Yes    Comment: occasional alcohol  . Drug use: Not Currently    Types: Marijuana    Comment: Occasional marijuana quit 6 months ago   . Sexual activity: Yes    Birth control/protection: None  Other Topics Concern  . Not on file  Social History Narrative  . Not on file   Social Determinants of Health   Financial Resource Strain:   . Difficulty of Paying Living Expenses:   Food Insecurity:   . Worried About Charity fundraiser in the Last Year:   . Arboriculturist in the Last Year:   Transportation Needs:   . Film/video editor (Medical):   Marland Kitchen Lack of Transportation (Non-Medical):   Physical Activity:   . Days of Exercise per Week:   . Minutes of Exercise per Session:   Stress:   . Feeling of Stress :   Social Connections:   . Frequency of Communication with Friends and Family:   . Frequency of Social Gatherings with Friends and Family:   . Attends Religious Services:   . Active Member of Clubs or Organizations:   . Attends Archivist Meetings:   Marland Kitchen Marital Status:   Intimate Partner Violence:   . Fear of Current or Ex-Partner:   . Emotionally Abused:   Marland Kitchen Physically Abused:   . Sexually Abused:      No Known Allergies   Outpatient Medications Prior to Visit  Medication Sig Dispense Refill  . albuterol (VENTOLIN HFA) 108 (90 Base) MCG/ACT inhaler Inhale 2 puffs into the lungs every 4 (four) hours as needed for wheezing or shortness of breath. 18 g 3  . aspirin (EC-81 ASPIRIN) 81 MG EC tablet Take 1 tablet (81 mg total) by mouth daily. Swallow whole. 30 tablet 12  . atorvastatin  (LIPITOR) 80 MG tablet Take 1 tablet (80 mg total) by mouth daily. 90 tablet 3  . umeclidinium-vilanterol (ANORO ELLIPTA) 62.5-25 MCG/INH AEPB Inhale 1 puff into the lungs daily. 90 day supply ok 60 each 2   No facility-administered medications prior to visit.     Review of Systems  HENT: Negative for ear pain, rhinorrhea and sore throat.   Respiratory: Positive for shortness of breath. Negative for hemoptysis, sputum production and wheezing.   Cardiovascular: Negative for chest pain, orthopnea, leg swelling and PND.  Gastrointestinal: Negative for vomiting.  Skin: Negative for rash.  Neurological: Positive for headaches.       Objective:   Physical  Exam There were no vitals filed for this visit. This is a phone visit there was no exam     Assessment & Plan:  I personally reviewed all images and lab data in the [facility] system as well as any outside material available during this office visit and agree with the  radiology impressions.   Stage 1 mild COPD by GOLD classification ([place]) Gold COPD stage I with improvement of FEV1 to 82% predicted from previous values of 61% predicted in March 2020  Plan is to continue Anoro 1 puff daily and albuterol as needed  We will also refer to pulmonary rehab as well  Oxygen therapy not indicated  History of non-ST elevation myocardial infarction (NSTEMI) Prior history of non-ST segment myocardial infarction with normal coronary arteries on cath and cardiomyopathy  80 mg of atorvastatin resulted in severe headaches I will notify primary care and she is holding her atorvastatin for now   Diagnoses and all orders for this visit:  Stage 1 mild COPD by GOLD classification ([place]) -     AMB referral to pulmonary rehabilitation  History of non-ST elevation myocardial infarction (NSTEMI)   Follow Up Instructions: The patient knows a in person exam will be scheduled for August   I discussed the assessment and treatment plan with the patient. The  patient was provided an opportunity to ask questions and all were answered. The patient agreed with the plan and demonstrated an understanding of the instructions.   The patient was advised to call back or seek an in-person evaluation if the symptoms worsen or if the condition fails to improve as anticipated  I provided 63minutes of non-face-to-face time during this encounter  including  median intraservice time , review of notes, labs, imaging, medications  and explaining diagnosis and management to the patient .    Asencion Noble, MD

## 2020-05-01 NOTE — Progress Notes (Signed)
Received referral from Dr. Asencion Noble for this pt to participate in pulmonary rehab with the the diagnosis of COPD Stage I.  This  Is a covered diagnosis is listed under Respiratory Services. Pt completed PFT on 5/24 which showed FEV1 post predicted 85.   Clinical review of pt follow up appt on 6/16 Pulmonary/ community health and wellness office note.  Pt with Covid Risk Score - 1. Pt appropriate for scheduling for Pulmonary rehab.  Will forward to support staff verification of insurance eligibility/benefits and pulmonary rehab for scheduling with pt consent. Cherre Huger, BSN Cardiac and Training and development officer

## 2020-05-01 NOTE — Assessment & Plan Note (Signed)
Gold COPD stage I with improvement of FEV1 to 82% predicted from previous values of 61% predicted in March 2020  Plan is to continue Anoro 1 puff daily and albuterol as needed  We will also refer to pulmonary rehab as well  Oxygen therapy not indicated

## 2020-05-01 NOTE — Progress Notes (Signed)
F /u COPD   C/o headache since starting  Lipitor

## 2020-05-01 NOTE — Assessment & Plan Note (Signed)
Prior history of non-ST segment myocardial infarction with normal coronary arteries on cath and cardiomyopathy  80 mg of atorvastatin resulted in severe headaches I will notify primary care and she is holding her atorvastatin for now

## 2020-05-02 ENCOUNTER — Telehealth (HOSPITAL_COMMUNITY): Payer: Self-pay

## 2020-05-02 NOTE — Telephone Encounter (Signed)
Attempted to call pt in regards to pulmonary rehab and to go over insurance benefits for pulmonary rehab.Marland Kitchen LMTCB. Passed pt ppw to the pulmonary rehab staff.

## 2020-05-02 NOTE — Telephone Encounter (Signed)
Pt insurance is active and benefits verified through [employer] $100, DED 0/0 met, out of pocket $8,550/$16.82 met, co-insurance 0%. no pre-authorization required. Passport, [place], REF# [id number]

## 2020-05-02 NOTE — Telephone Encounter (Signed)
Pt returned PR phone call and stated she would not be able to attend at this time due to the co-pay. I did offer pt our PR maintenance program but declined.  Closed referral

## 2020-05-03 ENCOUNTER — Other Ambulatory Visit: Payer: Self-pay

## 2020-05-03 ENCOUNTER — Ambulatory Visit
Admission: RE | Admit: 2020-05-03 | Discharge: 2020-05-03 | Disposition: A | Discharge status: Home / Self Care | Payer: 59 | Source: Ambulatory Visit | Attending: Internal Medicine | Admitting: Internal Medicine

## 2020-05-03 DIAGNOSIS — Z1231 Encounter for screening mammogram for malignant neoplasm of breast: Secondary | ICD-10-CM

## 2020-05-06 ENCOUNTER — Encounter (HOSPITAL_COMMUNITY): Payer: Self-pay

## 2020-05-06 ENCOUNTER — Other Ambulatory Visit: Payer: Self-pay

## 2020-05-06 ENCOUNTER — Ambulatory Visit (HOSPITAL_COMMUNITY)
Admission: EM | Admit: 2020-05-06 | Discharge: 2020-05-06 | Disposition: A | Discharge status: Home / Self Care | Payer: 59 | Attending: Family Medicine | Admitting: Family Medicine

## 2020-05-06 DIAGNOSIS — G43801 Other migraine, not intractable, with status migrainosus: Secondary | ICD-10-CM

## 2020-05-06 MED ORDER — DEXAMETHASONE SODIUM PHOSPHATE 10 MG/ML IJ SOLN
10.0000 mg | Freq: Once | INTRAMUSCULAR | Status: AC
  Administered 2020-05-06: 10 mg via INTRAMUSCULAR

## 2020-05-06 MED ORDER — KETOROLAC TROMETHAMINE 60 MG/2ML IM SOLN
INTRAMUSCULAR | Status: AC
  Filled 2020-05-06: qty 2

## 2020-05-06 MED ORDER — KETOROLAC TROMETHAMINE 60 MG/2ML IM SOLN
60.0000 mg | Freq: Once | INTRAMUSCULAR | Status: AC
  Administered 2020-05-06: 60 mg via INTRAMUSCULAR

## 2020-05-06 MED ORDER — DEXAMETHASONE SODIUM PHOSPHATE 10 MG/ML IJ SOLN
INTRAMUSCULAR | Status: AC
  Filled 2020-05-06: qty 1

## 2020-05-06 NOTE — Discharge Instructions (Signed)
You have received a migraine cocktail in the office today  If this does not help your headache, go to the ER for further evaluation and treatment  If this turns into the worst headache of your life, go to the ER  Let your primary care provider know that you have been experiencing a headache this long

## 2020-05-06 NOTE — ED Triage Notes (Signed)
Pt reports headache and blurred x 3 weeks after she started taking Lipitor.

## 2020-05-06 NOTE — ED Provider Notes (Signed)
[facility]   [id number] 05/06/20 Arrival Time: 0801  CC: HEADACHE  SUBJECTIVE:  Caitlin [name] is a 54 y.o. female who complains of migraine on and off for the last 3 weeks. She attributes this to taking lipitor. She has discussed with PCP and she stopped taking lipitor on 04/18/20. Denies a precipitating event, or recent head trauma.  Patient localizes her pain to the right side of her head. Describes the pain as constant and throbbing in character. Patient has tried OTC tylenol and ibuprofen without relief. Symptoms are made worse with loud noise.  Reports similar symptoms in the past that improved with OTC medications. This is not the worst headache of their life. Patient denies fever, chills, nausea, vomiting, aura, rhinorrhea, watery eyes, chest pain, SOB, abdominal pain, weakness, numbness or tingling, slurred speech.     ROS: As per HPI.  All other pertinent ROS negative.     Past Medical History:  Diagnosis Date   CHF (congestive heart failure) ([facility])    COPD (chronic obstructive pulmonary disease) ([facility])    NSTEMI (non-ST elevated myocardial infarction) ([place])    Takotsubo cardiomyopathy    Past Surgical History:  Procedure Laterality Date   LEFT HEART CATH AND CORONARY ANGIOGRAPHY N/A 07/15/2017   Procedure: LEFT HEART CATH AND CORONARY ANGIOGRAPHY;  Surgeon: Martinique, Peter M, MD;  Location: [facility];  Service: Cardiovascular;  Laterality: N/A;   No Known Allergies No current facility-administered medications on file prior to encounter.   Current Outpatient Medications on File Prior to Encounter  Medication Sig Dispense Refill   albuterol (VENTOLIN HFA) 108 (90 Base) MCG/ACT inhaler Inhale 2 puffs into the lungs every 4 (four) hours as needed for wheezing or shortness of breath. 18 g 3   aspirin (EC-81 ASPIRIN) 81 MG EC tablet Take 1 tablet (81 mg total) by mouth daily. Swallow whole. 30 tablet 12   atorvastatin (LIPITOR) 80 MG tablet Take 1  tablet (80 mg total) by mouth daily. 90 tablet 3   umeclidinium-vilanterol (ANORO ELLIPTA) 62.5-25 MCG/INH AEPB Inhale 1 puff into the lungs daily. 90 day supply ok 60 each 2   Social History   Socioeconomic History   Marital status: Divorced    Spouse name: Not on file   Number of children: Not on file   Years of education: Not on file   Highest education level: Not on file  Occupational History   Occupation: CNA  Tobacco Use   Smoking status: Former Smoker    Packs/day: 1.00    Types: Cigarettes    Quit date: 08/16/2017    Years since quitting: 2.7   Smokeless tobacco: Never Used   Tobacco comment: quit 07/12/17  Vaping Use   Vaping Use: Never used  Substance and Sexual Activity   Alcohol use: Yes    Comment: occasional alcohol   Drug use: Not Currently    Types: Marijuana    Comment: Occasional marijuana quit 6 months ago    Sexual activity: Yes    Birth control/protection: None  Other Topics Concern   Not on file  Social History Narrative   Not on file   Social Determinants of Health   Financial Resource Strain:    Difficulty of Paying Living Expenses:   Food Insecurity:    Worried About [place] in the Last Year:    Arboriculturist in the Last Year:   Transportation Needs:    Film/video editor (Medical):  Lack of Transportation (Non-Medical):   Physical Activity:    Days of Exercise per Week:    Minutes of Exercise per Session:   Stress:    Feeling of Stress :   Social Connections:    Frequency of Communication with Friends and Family:    Frequency of Social Gatherings with Friends and Family:    Attends Religious Services:    Active Member of Clubs or Organizations:    Attends Music therapist:    Marital Status:   Intimate Partner Violence:    Fear of Current or Ex-Partner:    Emotionally Abused:    Physically Abused:    Sexually Abused:    Family History  Problem Relation Age of  Onset   Heart disease Mother        age 38's   Hypertension Mother    Aneurysm Father        brain    OBJECTIVE:  Vitals:   05/06/20 0817  BP: (!) 144/84  Pulse: 66  Resp: 18  Temp: 98.4 F (36.9 C)  TempSrc: Oral  SpO2: 97%    General appearance: alert; no distress Eyes: PERRLA; EOMI HENT: normocephalic; atraumatic Neck: supple with FROM Lungs: clear to auscultation bilaterally Heart: regular rate and rhythm.  Radial pulses 2+ symmetrical bilaterally Extremities: no edema; symmetrical with no gross deformities Skin: warm and dry Neurologic: CN 2-12 grossly intact; finger to nose without difficulty; normal gait; strength and sensation intact bilaterally about the upper and lower extremities; negative pronator drift Psychological: alert and cooperative; normal mood and affect   ASSESSMENT & PLAN:  1. Other migraine with status migrainosus, not intractable     Meds ordered this encounter  Medications   ketorolac (TORADOL) injection 60 mg   dexamethasone (DECADRON) injection 10 mg    Migraine  Toradol 60mg  IM in office today Decadron 10mg  IM in office today Migraine cocktail given in office Rest and drink plenty of fluids Use OTC medications as needed for symptomatic relief Follow up with PCP if symptoms persists Return or go to the ER if you have any new or worsening symptoms such as fever, chills, nausea, vomiting, chest pain, shortness of breath, cough, vision changes, worsening headache despite treatment, slurred speech, facial asymmetry, weakness in arms or legs.  Reviewed expectations re: course of current medical issues. Questions answered. Outlined signs and symptoms indicating need for more acute intervention. Patient verbalized understanding. After Visit Summary given.    Faustino Congress, NP 05/06/20 [phone]

## 2020-05-22 ENCOUNTER — Ambulatory Visit (AMBULATORY_SURGERY_CENTER): Payer: Self-pay | Admitting: *Deleted

## 2020-05-22 ENCOUNTER — Other Ambulatory Visit: Payer: Self-pay

## 2020-05-22 VITALS — Ht 69.0 in | Wt 202.0 lb

## 2020-05-22 DIAGNOSIS — Z1211 Encounter for screening for malignant neoplasm of colon: Secondary | ICD-10-CM

## 2020-05-22 MED ORDER — NA SULFATE-K SULFATE-MG SULF 17.5-3.13-1.6 GM/177ML PO SOLN: 1.0000 | Freq: Once | ORAL | 0 refills | Status: AC

## 2020-05-22 NOTE — Progress Notes (Signed)
No egg or soy allergy known to patient  No issues with past sedation with any surgeries  or procedures, no intubation problems  No diet pills per patient No home 02 use per patient  No blood thinners per patient  Pt denies issues with constipation  No A fib or A flutter  EMMI video sent to pt's e mail   COVID 19 guidelines implemented in [facility] today   COVID vaccine completed on 02/2020  Due to the COVID-19 pandemic we are asking patients to follow these guidelines. Please only bring one care partner. Please be aware that your care partner may wait in the car in the parking lot or if they feel like they will be too hot to wait in the car, they may wait in the lobby on the 4th floor. All care partners are required to wear a mask the entire time (we do not have any that we can provide them), they need to practice social distancing, and we will do a Covid check for all patient's and care partners when you arrive. Also we will check their temperature and your temperature. If the care partner waits in their car they need to stay in the parking lot the entire time and we will call them on their cell phone when the patient is ready for discharge so they can bring the car to the front of the building. Also all patient's will need to wear a mask into building.

## 2020-06-04 ENCOUNTER — Other Ambulatory Visit: Payer: Self-pay

## 2020-06-04 ENCOUNTER — Encounter: Payer: Self-pay | Admitting: Gastroenterology

## 2020-06-04 ENCOUNTER — Ambulatory Visit (AMBULATORY_SURGERY_CENTER): Payer: 59 | Admitting: Gastroenterology

## 2020-06-04 VITALS — BP 106/70 | HR 67 | Temp 96.8°F | Resp 11 | Ht 69.0 in | Wt 202.0 lb

## 2020-06-04 DIAGNOSIS — Z1211 Encounter for screening for malignant neoplasm of colon: Secondary | ICD-10-CM

## 2020-06-04 MED ORDER — SODIUM CHLORIDE 0.9 % IV SOLN: 500.0000 mL | Freq: Once | INTRAVENOUS | Status: DC

## 2020-06-04 NOTE — Patient Instructions (Signed)
YOU HAD AN ENDOSCOPIC PROCEDURE TODAY AT THE [facility]:   Refer to the procedure report that was given to you for any specific questions about what was found during the examination.  If the procedure report does not answer your questions, please call your gastroenterologist to clarify.  If you requested that your care partner not be given the details of your procedure findings, then the procedure report has been included in a sealed envelope for you to review at your convenience later.  YOU SHOULD EXPECT: Some feelings of bloating in the abdomen. Passage of more gas than usual.  Walking can help get rid of the air that was put into your GI tract during the procedure and reduce the bloating. If you had a lower endoscopy (such as a colonoscopy or flexible sigmoidoscopy) you may notice spotting of blood in your stool or on the toilet paper. If you underwent a bowel prep for your procedure, you may not have a normal bowel movement for a few days.  Please Note:  You might notice some irritation and congestion in your nose or some drainage.  This is from the oxygen used during your procedure.  There is no need for concern and it should clear up in a day or so.  SYMPTOMS TO REPORT IMMEDIATELY:   Following lower endoscopy (colonoscopy or flexible sigmoidoscopy):  Excessive amounts of blood in the stool  Significant tenderness or worsening of abdominal pains  Swelling of the abdomen that is new, acute  Fever of 100F or higher  For urgent or emergent issues, a gastroenterologist can be reached at any hour by calling [phone]. Do not use MyChart messaging for urgent concerns.    DIET:  We do recommend a small meal at first, but then you may proceed to your regular diet.  Drink plenty of fluids but you should avoid alcoholic beverages for 24 hours.  ACTIVITY:  You should plan to take it easy for the rest of today and you should NOT DRIVE or use heavy machinery until tomorrow (because  of the sedation medicines used during the test).    FOLLOW UP: Our staff will call the number listed on your records 48-72 hours following your procedure to check on you and address any questions or concerns that you may have regarding the information given to you following your procedure. If we do not reach you, we will leave a message.  We will attempt to reach you two times.  During this call, we will ask if you have developed any symptoms of COVID 19. If you develop any symptoms (ie: fever, flu-like symptoms, shortness of breath, cough etc.) before then, please call [phone].  If you test positive for Covid 19 in the 2 weeks post procedure, please call and report this information to us.    If any biopsies were taken you will be contacted by phone or by letter within the next 1-3 weeks.  Please call us at [phone] if you have not heard about the biopsies in 3 weeks.    SIGNATURES/CONFIDENTIALITY: You and/or your care partner have signed paperwork which will be entered into your electronic medical record.  These signatures attest to the fact that that the information above on your After Visit Summary has been reviewed and is understood.  Full responsibility of the confidentiality of this discharge information lies with you and/or your care-partner. 

## 2020-06-04 NOTE — Progress Notes (Signed)
PT taken to PACU. Monitors in place. VSS. Report given to RN. 

## 2020-06-04 NOTE — Progress Notes (Signed)
Pt's states no medical or surgical changes since previsit or office visit.  V/S-CW  Check-in-MG

## 2020-06-04 NOTE — Op Note (Signed)
[facility] Patient Name: Caitlin [name] [facility] Procedure Date: 06/04/2020 11:37 AM MRN: [id number] Endoscopist: Langford. Loletha Carrow , MD Age: 54 Referring MD:  Date of Birth: [birth year] Gender: Female Account #: [id number] Procedure:                Colonoscopy Indications:              Screening for colorectal malignant neoplasm, This                            is the patient's first colonoscopy Medicines:                Monitored Anesthesia Care Procedure:                Pre-Anesthesia Assessment:                           - Prior to the procedure, a History and Physical                            was performed, and patient medications and                            allergies were reviewed. The patient's tolerance of                            previous anesthesia was also reviewed. The risks                            and benefits of the procedure and the sedation                            options and risks were discussed with the patient.                            All questions were answered, and informed consent                            was obtained. Prior Anticoagulants: The patient has                            taken no previous anticoagulant or antiplatelet                            agents. ASA Grade Assessment: III - A patient with                            severe systemic disease. After reviewing the risks                            and benefits, the patient was deemed in                            satisfactory condition to undergo the procedure.  After obtaining informed consent, the colonoscope                            was passed under direct vision. Throughout the                            procedure, the patient's blood pressure, pulse, and                            oxygen saturations were monitored continuously. The                            Colonoscope was introduced through the anus and                            advanced to the the  cecum, identified by                            appendiceal orifice and ileocecal valve. The                            colonoscopy was performed without difficulty. The                            patient tolerated the procedure well. The quality                            of the bowel preparation was good after lavage. The                            ileocecal valve, appendiceal orifice, and rectum                            were photographed. The bowel preparation used was                            SUPREP. Scope In: 14:43:15 AM Scope Out: 11:59:40 AM Scope Withdrawal Time: 0 hours 10 minutes 11 seconds  Total Procedure Duration: 0 hours 12 minutes 58 seconds  Findings:                 The perianal and digital rectal examinations were                            normal.                           The entire examined colon appeared normal on direct                            and retroflexion views. Complications:            No immediate complications. Estimated Blood Loss:     Estimated blood loss: none. Impression:               - The entire examined colon is normal on  direct and                            retroflexion views.                           - No specimens collected. Recommendation:           - Patient has a contact number available for                            emergencies. The signs and symptoms of potential                            delayed complications were discussed with the                            patient. Return to normal activities tomorrow.                            Written discharge instructions were provided to the                            patient.                           - Resume previous diet.                           - Continue present medications.                           - Repeat colonoscopy in 10 years for screening                            purposes. Dadrian Ballantine L. Loletha Carrow, MD 06/04/2020 12:03:54 PM This report has been signed electronically.

## 2020-07-02 ENCOUNTER — Ambulatory Visit: Payer: 59 | Admitting: Critical Care Medicine

## 2020-07-03 ENCOUNTER — Other Ambulatory Visit: Payer: Self-pay

## 2020-07-03 ENCOUNTER — Encounter: Payer: Self-pay | Admitting: Critical Care Medicine

## 2020-07-03 ENCOUNTER — Ambulatory Visit: Payer: 59 | Attending: Critical Care Medicine | Admitting: Critical Care Medicine

## 2020-07-03 VITALS — BP 109/70 | HR 83 | Ht 69.0 in | Wt 202.4 lb

## 2020-07-03 DIAGNOSIS — J449 Chronic obstructive pulmonary disease, unspecified: Secondary | ICD-10-CM

## 2020-07-03 DIAGNOSIS — J432 Centrilobular emphysema: Secondary | ICD-10-CM | POA: Diagnosis not present

## 2020-07-03 DIAGNOSIS — Z87891 Personal history of nicotine dependence: Secondary | ICD-10-CM

## 2020-07-03 NOTE — Assessment & Plan Note (Addendum)
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

## 2020-07-03 NOTE — Patient Instructions (Signed)
No change in medications  Work on exercise  Continue health diet , see below  Return Dr Joya Gaskins 6 months

## 2020-07-03 NOTE — Assessment & Plan Note (Signed)
Congratulated the patient on her quitting smoking

## 2020-07-03 NOTE — Progress Notes (Signed)
Subjective:    Patient ID: Caitlin [name], female    DOB: 01/11/1966, 54 y.o.   MRN: [id number]  History of Present Illness: 54 y.o.F ref by PCP for Copd evaluation This patient first recognized she had advanced COPD when she had pneumonia and respiratory failure in 2018 at that time she required chronic vent support and tracheostomy tube.  Ultimately she was weaned from mechanical ventilation and the trach was decannulated.  She is now currently off oxygen therapy.  She has been on Anoro 1 inhalation daily for the past 2 years.  Despite this she is getting progressively worse with increased dyspnea on exertion.  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  The patient denies much in the way of cough.  The symptoms are primarily dyspnea with exertion.  See shortness of breath assessment below  05/01/2020 Patient is seen in return follow-up and this is a telephone visit as she was not able to achieve a video visit.  The patient recent pulmonary functions in her FEV1 is now improved to 82% predicted from 61% predicted in 2020.  Her FEF 2575 was at 45%.  Residual volume 124% predicted and diffusion capacity of 58% predicted.  This is consistent with stage I COPD and she does states she is improved on the Anoro.  We were unable to get her inhaled steroid from her insurance company.  The patient's not on oxygen therapy at this time.  She was started on atorvastatin and low-dose aspirin for hyperlipidemia and she has had increased headache and some emesis on the atorvastatin she is holding it at this time.  Note her chest x-ray in May was normal.  Her dyspnea is at baseline now. The patient has stopped smoking at this time.  07/03/2020 Patient is seen return follow-up and she is no longer smoking.  Breathing is better on current inhaled medications.  She has mild COPD with mild centrilobular emphysema does not require oxygen at this  time.  She has no other new complaints.   Shortness of Breath This is a chronic problem. The current episode started more than 1 year ago. The problem occurs constantly (up stairs, any exertion and has to use inhaler , bend over, cannot lift 10- #). The problem has been rapidly improving. Pertinent negatives include no chest pain, ear pain, headaches, hemoptysis, leg pain, leg swelling, orthopnea, PND, rash, rhinorrhea, sore throat, sputum production, swollen glands, vomiting or wheezing. The symptoms are aggravated by exercise, any activity, lying flat, odors, fumes and weather changes. Associated symptoms comments: Cough is tired or dif exertional. Risk factors include smoking. She has tried beta agonist inhalers, ipratropium inhalers and oral steroids for the symptoms. The treatment provided moderate relief. Her past medical history is significant for COPD.    Past Medical History:  Diagnosis Date   CHF (congestive heart failure) (HCC)    COPD (chronic obstructive pulmonary disease) (HCC)    NSTEMI (non-ST elevated myocardial infarction) ([place])    pt denies   Takotsubo cardiomyopathy      Family History  Problem Relation Age of Onset   Heart disease Mother        age 74's   Hypertension Mother    Pancreatic cancer Mother    Aneurysm Father        brain   Colon cancer Neg Hx    Colon polyps Neg Hx    Esophageal cancer Neg  Hx    Rectal cancer Neg Hx    Stomach cancer Neg Hx      Social History   Socioeconomic History   Marital status: Divorced    Spouse name: Not on file   Number of children: Not on file   Years of education: Not on file   Highest education level: Not on file  Occupational History   Occupation: CNA  Tobacco Use   Smoking status: Former Smoker    Packs/day: 1.00    Types: Cigarettes    Quit date: 08/16/2017    Years since quitting: 2.8   Smokeless tobacco: Never Used   Tobacco comment: quit 07/12/17  Vaping Use   Vaping Use: Never  used  Substance and Sexual Activity   Alcohol use: Yes    Comment: occasionally   Drug use: Not Currently    Comment: Occasional marijuana quit 6 months ago    Sexual activity: Yes    Birth control/protection: None  Other Topics Concern   Not on file  Social History Narrative   Not on file   Social Determinants of Health   Financial Resource Strain:    Difficulty of Paying Living Expenses:   Food Insecurity:    Worried About Charity fundraiser in the Last Year:    Arboriculturist in the Last Year:   Transportation Needs:    Film/video editor (Medical):    Lack of Transportation (Non-Medical):   Physical Activity:    Days of Exercise per Week:    Minutes of Exercise per Session:   Stress:    Feeling of Stress :   Social Connections:    Frequency of Communication with Friends and Family:    Frequency of Social Gatherings with Friends and Family:    Attends Religious Services:    Active Member of Clubs or Organizations:    Attends Music therapist:    Marital Status:   Intimate Partner Violence:    Fear of Current or Ex-Partner:    Emotionally Abused:    Physically Abused:    Sexually Abused:      No Known Allergies   Outpatient Medications Prior to Visit  Medication Sig Dispense Refill   albuterol (VENTOLIN HFA) 108 (90 Base) MCG/ACT inhaler Inhale 2 puffs into the lungs every 4 (four) hours as needed for wheezing or shortness of breath. 18 g 3   umeclidinium-vilanterol (ANORO ELLIPTA) 62.5-25 MCG/INH AEPB Inhale 1 puff into the lungs daily. 90 day supply ok 60 each 2   Ascorbic Acid (VITAMIN C PO) Take by mouth. (Patient not taking: Reported on 07/03/2020)     Psyllium (METAMUCIL FIBER PO) Take by mouth. (Patient not taking: Reported on 07/03/2020)     No facility-administered medications prior to visit.     Review of Systems  HENT: Negative for ear pain, rhinorrhea and sore throat.   Respiratory: Positive for  shortness of breath. Negative for hemoptysis, sputum production and wheezing.   Cardiovascular: Negative for chest pain, orthopnea, leg swelling and PND.  Gastrointestinal: Negative for vomiting.  Skin: Negative for rash.  Neurological: Negative for headaches.       Objective:   Physical Exam Vitals:   07/03/20 1434  BP: 109/70  Pulse: 83  SpO2: 98%  Weight: 202 lb 6.4 oz (91.8 kg)  Height: 5\' 9"  (1.753 m)   Gen: Pleasant, well-nourished, in no distress,  normal affect  ENT: No lesions,  mouth clear,  oropharynx clear, no postnasal drip  Neck: No JVD, no TMG, no carotid bruits  Lungs: No use of accessory muscles, no dullness to percussion, distant breath sounds Cardiovascular: RRR, heart sounds normal, no murmur or gallops, no peripheral edema  Abdomen: soft and NT, no HSM,  BS normal  Musculoskeletal: No deformities, no cyanosis or clubbing  Neuro: alert, non focal  Skin: Warm, no lesions or rashes       Assessment & Plan:  I personally reviewed all images and lab data in the [facility] system as well as any outside material available during this office visit and agree with the  radiology impressions.   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  Diagnoses and all orders for this visit:  Centrilobular emphysema ([place])  Stopped smoking with greater than 30 pack year history    Return in follow-up in 6 months as needed

## 2020-09-27 ENCOUNTER — Other Ambulatory Visit: Payer: 59

## 2020-09-27 DIAGNOSIS — Z20822 Contact with and (suspected) exposure to covid-19: Secondary | ICD-10-CM

## 2020-09-28 LAB — SARS-COV-2, NAA 2 DAY TAT

## 2020-09-28 LAB — NOVEL CORONAVIRUS, NAA: SARS-CoV-2, NAA: NOT DETECTED

## 2021-01-21 ENCOUNTER — Encounter: Payer: Self-pay | Admitting: Critical Care Medicine

## 2021-01-21 ENCOUNTER — Ambulatory Visit: Payer: 59 | Attending: Critical Care Medicine | Admitting: Critical Care Medicine

## 2021-01-21 ENCOUNTER — Telehealth: Payer: Self-pay | Admitting: Internal Medicine

## 2021-01-21 ENCOUNTER — Other Ambulatory Visit: Payer: Self-pay

## 2021-01-21 VITALS — BP 133/85 | HR 83 | Wt 207.4 lb

## 2021-01-21 DIAGNOSIS — Z1211 Encounter for screening for malignant neoplasm of colon: Secondary | ICD-10-CM | POA: Diagnosis not present

## 2021-01-21 DIAGNOSIS — J449 Chronic obstructive pulmonary disease, unspecified: Secondary | ICD-10-CM | POA: Diagnosis not present

## 2021-01-21 MED ORDER — TRELEGY ELLIPTA 100-62.5-25 MCG/INH IN AEPB: 1.0000 | INHALATION_SPRAY | Freq: Every day | RESPIRATORY_TRACT | 2 refills | Status: DC

## 2021-01-21 NOTE — Progress Notes (Signed)
Wheezing Pain in heels of feet for about 2 months

## 2021-01-21 NOTE — Assessment & Plan Note (Signed)
Gold stage a COPD with airway inflammation leading to bronchospasm  Plan is to begin Trelegy 1 puff daily and discontinue further Anoro  Continue albuterol as needed

## 2021-01-21 NOTE — Patient Instructions (Signed)
Begin Trelegy 1 inhalation daily and discontinue Anoro  No other changes in medications  Colon cancer fecal occult screening kit was issued today for colon cancer screening  Return to see Dr. Joya Gaskins 2 months

## 2021-01-21 NOTE — Telephone Encounter (Signed)
Copied from [place] [id number]. Topic: General - Other >> Jan 21, 2021  3:30 PM Leward Quan A wrote: Reason for CRM: Patient called in to inform Dr Joya Gaskins that the Rx Fluticasone-Umeclidin-Vilant (TRELEGY ELLIPTA) 100-62.5-25 MCG/INH AEPB is  $733.00 and she cant afford it. Will call her insurance to see what is covered. Please advise Ph# [phone]

## 2021-01-21 NOTE — Progress Notes (Signed)
Subjective:    Patient ID: Caitlin [name], female    DOB: 08/20/1966, 55 y.o.   MRN: [id number]  History of Present Illness: 55 y.o.F ref by PCP for Copd evaluation This patient first recognized she had advanced COPD when she had pneumonia and respiratory failure in 2018 at that time she required chronic vent support and tracheostomy tube.  Ultimately she was weaned from mechanical ventilation and the trach was decannulated.  She is now currently off oxygen therapy.  She has been on Anoro 1 inhalation daily for the past 2 years.  Despite this she is getting progressively worse with increased dyspnea on exertion.  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  The patient denies much in the way of cough.  The symptoms are primarily dyspnea with exertion.  See shortness of breath assessment below  05/01/2020 Patient is seen in return follow-up and this is a telephone visit as she was not able to achieve a video visit.  The patient recent pulmonary functions in her FEV1 is now improved to 82% predicted from 61% predicted in 2020.  Her FEF 2575 was at 45%.  Residual volume 124% predicted and diffusion capacity of 58% predicted.  This is consistent with stage I COPD and she does states she is improved on the Anoro.  We were unable to get her inhaled steroid from her insurance company.  The patient's not on oxygen therapy at this time.  She was started on atorvastatin and low-dose aspirin for hyperlipidemia and she has had increased headache and some emesis on the atorvastatin she is holding it at this time.  Note her chest x-ray in May was normal.  Her dyspnea is at baseline now. The patient has stopped smoking at this time.  07/03/2020 Patient is seen return follow-up and she is no longer smoking.  Breathing is better on current inhaled medications.  She has mild COPD with mild centrilobular emphysema does not require oxygen at this  time.  She has no other new complaints.   01/21/2021 Patient seen in return follow-up with mild COPD stage I but notes increased wheezing in the spring season.  Patient is on the Anoro at this time.  Note she had mild reduction in airway obstruction with PFTs in March 2021 and diffusion capacity was severely reduced.  Patient is no longer smoking at this time.  See shortness of breath assessment below.   Shortness of Breath This is a chronic problem. The current episode started more than 1 year ago. The problem occurs every several days ( worse end of day,  DOE if rushes to do activity.  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 The problem has been gradually worsening. Associated symptoms include orthopnea and wheezing. Pertinent negatives include no abdominal pain, chest pain, ear pain, fever, headaches, hemoptysis, leg pain, leg swelling, PND, rash, rhinorrhea, sore throat, sputum production, swollen glands or vomiting. The symptoms are aggravated by exercise, any activity, lying flat, odors, fumes and weather changes. Associated symptoms comments: Cough is tired or dif exertional  Heels hurt bilaterally  Some gerd. Risk factors include smoking. She has tried beta agonist inhalers, ipratropium inhalers and oral steroids for the symptoms. The treatment provided moderate relief. Her past medical history is significant for COPD.    Past Medical History:  Diagnosis Date  . CHF (congestive heart failure) ([place])   .  COPD (chronic obstructive pulmonary disease) ([place])   . NSTEMI (non-ST elevated myocardial infarction) ([place])    pt denies  . Takotsubo cardiomyopathy      Family History  Problem Relation Age of Onset  . Heart disease Mother        age 65's  . Hypertension Mother   . Pancreatic cancer Mother   . Aneurysm Father        brain  . Colon cancer Neg Hx   . Colon polyps Neg Hx   . Esophageal cancer Neg Hx   . Rectal cancer Neg Hx   . Stomach cancer  Neg Hx      Social History   Socioeconomic History  . Marital status: Divorced    Spouse name: Not on file  . Number of children: Not on file  . Years of education: Not on file  . Highest education level: Not on file  Occupational History  . Occupation: CNA  Tobacco Use  . Smoking status: Former Smoker    Packs/day: 1.00    Types: Cigarettes    Quit date: 08/16/2017    Years since quitting: 3.4  . Smokeless tobacco: Never Used  . Tobacco comment: quit 07/12/17  Vaping Use  . Vaping Use: Never used  Substance and Sexual Activity  . Alcohol use: Yes    Comment: occasionally  . Drug use: Not Currently    Comment: Occasional marijuana quit 6 months ago   . Sexual activity: Yes    Birth control/protection: None  Other Topics Concern  . Not on file  Social History Narrative  . Not on file   Social Determinants of Health   Financial Resource Strain: Not on file  Food Insecurity: Not on file  Transportation Needs: Not on file  Physical Activity: Not on file  Stress: Not on file  Social Connections: Not on file  Intimate Partner Violence: Not on file     No Known Allergies   Outpatient Medications Prior to Visit  Medication Sig Dispense Refill  . albuterol (VENTOLIN HFA) 108 (90 Base) MCG/ACT inhaler Inhale 2 puffs into the lungs every 4 (four) hours as needed for wheezing or shortness of breath. 18 g 3  . umeclidinium-vilanterol (ANORO ELLIPTA) 62.5-25 MCG/INH AEPB Inhale 1 puff into the lungs daily. 90 day supply ok 60 each 2   No facility-administered medications prior to visit.     Review of Systems  Constitutional: Negative for fever.  HENT: Negative for ear pain, rhinorrhea and sore throat.   Respiratory: Positive for shortness of breath and wheezing. Negative for hemoptysis and sputum production.   Cardiovascular: Positive for orthopnea. Negative for chest pain, leg swelling and PND.  Gastrointestinal: Negative for abdominal pain and vomiting.  Skin:  Negative for rash.  Neurological: Negative for headaches.       Objective:   Physical Exam Vitals:   01/21/21 1421  BP: 133/85  Pulse: 83  SpO2: 98%  Weight: 207 lb 6.4 oz (94.1 kg)   Gen: Pleasant, well-nourished, in no distress,  normal affect  ENT: No lesions,  mouth clear,  oropharynx clear, no postnasal drip  Neck: No JVD, no TMG, no carotid bruits  Lungs: No use of accessory muscles, no dullness to percussion, distant breath sounds  Cardiovascular: RRR, heart sounds normal, no murmur or gallops, no peripheral edema  Abdomen: soft and NT, no HSM,  BS normal  Musculoskeletal: No deformities, no cyanosis or clubbing  Neuro: alert, non focal  Skin: Warm, no lesions  or rashes       Assessment & Plan:  I personally reviewed all images and lab data in the [facility] system as well as any outside material available during this office visit and agree with the  radiology impressions.   Stage 1 mild COPD by GOLD classification ([place]) Gold stage a COPD with airway inflammation leading to bronchospasm  Plan is to begin Trelegy 1 puff daily and discontinue further Anoro  Continue albuterol as needed   Kassiah was seen today for wheezing.  Diagnoses and all orders for this visit:  Colon cancer screening -     Fecal occult blood, imunochemical  Stage 1 mild COPD by GOLD classification ([place])  Other orders -     Fluticasone-Umeclidin-Vilant (TRELEGY ELLIPTA) 100-62.5-25 MCG/INH AEPB; Inhale 1 puff into the lungs daily.    I did issue a fecal occult kit for colon cancer screening

## 2021-01-22 NOTE — Telephone Encounter (Signed)
Copied from [place] [id number]. Topic: General - Inquiry >> Jan 22, 2021 12:42 PM Scherrie Gerlach wrote: Reason for CRM: Pt had a colonoscopy 7/21 and was given fecal occult kit for colon cancer screening yesterday by Dr Joya Gaskins.  Pt wants to know since she just had the colonoscopy,  does she need to do the kit she was given?

## 2021-01-22 NOTE — Telephone Encounter (Signed)
Documented colonoscopy was July 2021 per patient.  Advised the test was not needed.

## 2021-02-11 ENCOUNTER — Encounter: Payer: Self-pay | Admitting: Internal Medicine

## 2021-02-21 ENCOUNTER — Encounter: Payer: 59 | Admitting: Internal Medicine

## 2021-03-17 ENCOUNTER — Ambulatory Visit (INDEPENDENT_AMBULATORY_CARE_PROVIDER_SITE_OTHER): Payer: 59 | Admitting: Internal Medicine

## 2021-03-17 ENCOUNTER — Other Ambulatory Visit: Payer: Self-pay

## 2021-03-17 ENCOUNTER — Encounter: Payer: Self-pay | Admitting: Internal Medicine

## 2021-03-17 ENCOUNTER — Telehealth: Payer: Self-pay | Admitting: Internal Medicine

## 2021-03-17 VITALS — BP 128/86 | HR 80 | Wt 202.8 lb

## 2021-03-17 DIAGNOSIS — Z0001 Encounter for general adult medical examination with abnormal findings: Secondary | ICD-10-CM | POA: Diagnosis not present

## 2021-03-17 DIAGNOSIS — M722 Plantar fascial fibromatosis: Secondary | ICD-10-CM

## 2021-03-17 DIAGNOSIS — Z130 Encounter for screening for diseases of the blood and blood-forming organs and certain disorders involving the immune mechanism: Secondary | ICD-10-CM | POA: Diagnosis not present

## 2021-03-17 DIAGNOSIS — Z0000 Encounter for general adult medical examination without abnormal findings: Secondary | ICD-10-CM

## 2021-03-17 DIAGNOSIS — I252 Old myocardial infarction: Secondary | ICD-10-CM | POA: Diagnosis not present

## 2021-03-17 NOTE — Patient Instructions (Signed)
Plantar Fasciitis  Plantar fasciitis is a painful foot condition that affects the heel. It occurs when the band of tissue that connects the toes to the heel bone (plantar fascia) becomes irritated. This can happen as the result of exercising too much or doing other repetitive activities (overuse injury). Plantar fasciitis can cause mild irritation to severe pain that makes it difficult to walk or move. The pain is usually worse in the morning after sleeping, or after sitting or lying down for a period of time. Pain may also be worse after long periods of walking or standing. What are the causes? This condition may be caused by:  Standing for long periods of time.  Wearing shoes that do not have good arch support.  Doing activities that put stress on joints (high-impact activities). This includes ballet and exercise that makes your heart beat faster (aerobic exercise), such as running.  Being overweight.  An abnormal way of walking (gait).  Tight muscles in the back of your lower leg (calf).  High arches in your feet or flat feet.  Starting a new athletic activity. What are the signs or symptoms? The main symptom of this condition is heel pain. Pain may get worse after the following:  Taking the first steps after a time of rest, especially in the morning after awakening, or after you have been sitting or lying down for a while.  Long periods of standing still. Pain may decrease after 30-45 minutes of activity, such as gentle walking. How is this treated? Treatment for plantar fasciitis depends on how severe your condition is. Treatment may include:  Rest, ice, pressure (compression), and raising (elevating) the affected foot. This is called RICE therapy. Your health care provider may recommend RICE therapy along with over-the-counter pain medicines to manage your pain.  Exercises to stretch your calves and your plantar fascia.  A splint that holds your foot in a stretched, upward  position while you sleep (night splint).  Physical therapy to relieve symptoms and prevent problems in the future.  Injections of steroid medicine (cortisone) to relieve pain and inflammation.  Stimulating your plantar fascia with electrical impulses (extracorporeal shock wave therapy). This is usually the last treatment option before surgery.  Surgery, if other treatments have not worked after 12 months. Follow these instructions at home: Managing pain, stiffness, and swelling  If directed, put ice on the painful area. To do this: ? Put ice in a plastic bag, or use a frozen bottle of water. ? Place a towel between your skin and the bag or bottle. ? Roll the bottom of your foot over the bag or bottle. ? Do this for 20 minutes, 2-3 times a day.  Wear athletic shoes that have air-sole or gel-sole cushions, or try soft shoe inserts that are designed for plantar fasciitis.  Elevate your foot above the level of your heart while you are sitting or lying down.   Activity  Avoid activities that cause pain. Ask your health care provider what activities are safe for you.  Do physical therapy exercises and stretches as told by your health care provider.  Try activities and forms of exercise that are easier on your joints (low impact). Examples include swimming, water aerobics, and biking. General instructions  Take over-the-counter and prescription medicines only as told by your health care provider.  Wear a night splint while sleeping, if told by your health care provider. Loosen the splint if your toes tingle, become numb, or turn cold and blue.  Maintain a healthy weight, or work with your health care provider to lose weight as needed.  Keep all follow-up visits. This is important. Contact a health care provider if you have:  Symptoms that do not go away with home treatment.  Pain that gets worse.  Pain that affects your ability to move or do daily activities. Summary  Plantar  fasciitis is a painful foot condition that affects the heel. It occurs when the band of tissue that connects the toes to the heel bone (plantar fascia) becomes irritated.  Heel pain is the main symptom of this condition. It may get worse after exercising too much or standing still for a long time.  Treatment varies, but it usually starts with rest, ice, pressure (compression), and raising (elevating) the affected foot. This is called RICE therapy. Over-the-counter medicines can also be used to manage pain. This information is not intended to replace advice given to you by your health care provider. Make sure you discuss any questions you have with your health care provider. Document Revised: 02/19/2020 Document Reviewed: 02/19/2020 Elsevier Patient Education  2021 [employer].

## 2021-03-17 NOTE — Telephone Encounter (Signed)
Pt is stating she is looking at things on her [facility] and noticed the date she stopped smoking is incorrect and pt never had a heart attack. Pt is trying to find out the results from her labs that she had done today. I informed pt the lab results were not back and if something needed to be discussed the nurse would call her back.

## 2021-03-17 NOTE — Progress Notes (Signed)
Subjective:    Caitlin [name] - 55 y.o. female MRN [id number]  Date of birth: [birth year]  HPI  Caitlin [name] is here for annual exam. She has concerns about bilateral heel pain. Describes as aching shooting pain. Has been present for about 2 months. Occurs intermittently. Seems to be worse first thing in the morning and after prolonged periods of standing at work. Does not radiate up the legs. Has not tried anything for relief. No new exercise/running regimen.      Health Maintenance:  There are no preventive care reminders to display for this patient.  -  reports that she quit smoking about 3 years ago. Her smoking use included cigarettes. She smoked 1.00 pack per day. She has never used smokeless tobacco. - Review of Systems: Per HPI. - Past Medical History: Patient Active Problem List   Diagnosis Date Noted  . History of non-ST elevation myocardial infarction (NSTEMI) 04/17/2020  . Lipoma of left axilla 06/13/2019  . Centrilobular emphysema ([place]) 02/14/2019  . Stopped smoking with greater than 30 pack year history 01/17/2019  . Stage 1 mild COPD by GOLD classification ([place]) 01/17/2019  . Takotsubo cardiomyopathy    - Medications: reviewed and updated   Objective:   Physical Exam BP 128/86 (BP Location: Left Arm, Patient Position: Sitting)   Pulse 80   Wt 202 lb 12.8 oz (92 kg)   LMP 09/09/2014 (Approximate)   SpO2 98%   BMI 29.95 kg/m  Physical Exam Constitutional:      Appearance: She is not diaphoretic.  HENT:     Head: Normocephalic and atraumatic.  Eyes:     Conjunctiva/sclera: Conjunctivae normal.     Pupils: Pupils are equal, round, and reactive to light.  Neck:     Thyroid: No thyromegaly.  Cardiovascular:     Rate and Rhythm: Normal rate and regular rhythm.     Heart sounds: Normal heart sounds. No murmur heard.   Pulmonary:     Effort: Pulmonary effort is normal. No respiratory distress.     Breath sounds: Normal breath sounds. No  wheezing.  Abdominal:     General: Bowel sounds are normal. There is no distension.     Palpations: Abdomen is soft.     Tenderness: There is no abdominal tenderness. There is no guarding or rebound.  Musculoskeletal:        General: No deformity. Normal range of motion.     Cervical back: Normal range of motion and neck supple.  Lymphadenopathy:     Cervical: No cervical adenopathy.  Skin:    General: Skin is warm and dry.     Findings: No rash.  Neurological:     Mental Status: She is alert and oriented to person, place, and time.     Gait: Gait is intact.  Psychiatric:        Mood and Affect: Mood and affect normal.        Judgment: Judgment normal.            Assessment & Plan:   1. Encounter for annual physical exam Counseled on 150 minutes of exercise per week, healthy eating (including decreased daily intake of saturated fats, cholesterol, added sugars, sodium), STI prevention, routine healthcare maintenance.  2. History of non-ST elevation myocardial infarction (NSTEMI) Patient reports that she has actually never had a MI. She was prescribed Lipitor at last annual exam due to history/ASCVD risk and reports she did not take due to side effects.   -  Lipid panel - Comprehensive metabolic panel  3. Screening for deficiency anemia - CBC  4. Plantar fasciitis Discussed that her symptoms sound most consistent with plantar fascitis. Given instructions on stretches, supportive care/pain relief measures, importance of supportive shoes etc. Offered PT consult but patient declined for now due to work schedule.    Phill Myron, D.O. 03/17/2021, 9:11 AM Primary Care at [facility]

## 2021-03-17 NOTE — Progress Notes (Signed)
Physical  Pt states that heels of feet are painful makes hard to walk sometimes

## 2021-03-18 LAB — LIPID PANEL
Chol/HDL Ratio: 4.8 ratio — ABNORMAL HIGH (ref 0.0–4.4)
Cholesterol, Total: 218 mg/dL — ABNORMAL HIGH (ref 100–199)
HDL: 45 mg/dL (ref >39)
LDL Chol Calc (NIH): 160 mg/dL — ABNORMAL HIGH (ref 0–99)
Triglycerides: 73 mg/dL (ref 0–149)
VLDL Cholesterol Cal: 13 mg/dL (ref 5–40)

## 2021-03-18 LAB — COMPREHENSIVE METABOLIC PANEL
ALT: 17 IU/L (ref 0–32)
AST: 17 IU/L (ref 0–40)
Albumin/Globulin Ratio: 2 (ref 1.2–2.2)
Albumin: 4.5 g/dL (ref 3.8–4.9)
Alkaline Phosphatase: 63 IU/L (ref 44–121)
BUN/Creatinine Ratio: 12 (ref 9–23)
BUN: 9 mg/dL (ref 6–24)
Bilirubin Total: 0.2 mg/dL (ref 0.0–1.2)
CO2: 18 mmol/L — ABNORMAL LOW (ref 20–29)
Calcium: 9.6 mg/dL (ref 8.7–10.2)
Chloride: 106 mmol/L (ref 96–106)
Creatinine, Ser: 0.75 mg/dL (ref 0.57–1.00)
Globulin, Total: 2.3 g/dL (ref 1.5–4.5)
Glucose: 87 mg/dL (ref 65–99)
Potassium: 4.7 mmol/L (ref 3.5–5.2)
Sodium: 141 mmol/L (ref 134–144)
Total Protein: 6.8 g/dL (ref 6.0–8.5)
eGFR: 93 mL/min/{1.73_m2} (ref >59)

## 2021-03-18 LAB — CBC
Hematocrit: 41.1 % (ref 34.0–46.6)
Hemoglobin: 13.4 g/dL (ref 11.1–15.9)
MCH: 27.4 pg (ref 26.6–33.0)
MCHC: 32.6 g/dL (ref 31.5–35.7)
MCV: 84 fL (ref 79–97)
Platelets: 335 10*3/uL (ref 150–450)
RBC: 4.89 x10E6/uL (ref 3.77–5.28)
RDW: 13.8 % (ref 11.7–15.4)
WBC: 5.6 10*3/uL (ref 3.4–10.8)

## 2021-03-19 NOTE — Telephone Encounter (Signed)
Results seen by patient Caitlin [name] Journey on 03/19/2021 9:25 AM  Attempt to call patient back regarding message she left regarding message about medical history changes.

## 2021-03-19 NOTE — Telephone Encounter (Signed)
Pt called back upset because she states she did not receive a call back about the results of her lab work from her 03/20/2021 visit. 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

## 2021-03-20 NOTE — Telephone Encounter (Signed)
Left message on voicemail to return call.

## 2021-03-20 NOTE — Telephone Encounter (Signed)
Clarified results for patient.  She is aware of the process for changing information in her medical record.

## 2021-03-23 NOTE — Progress Notes (Signed)
Subjective:    Patient ID: Caitlin [name], female    DOB: 12-Jan-1966, 55 y.o.   MRN: [id number]  History of Present Illness: 55 y.o.F ref by PCP for Copd evaluation This patient first recognized she had advanced COPD when she had pneumonia and respiratory failure in 2018 at that time she required chronic vent support and tracheostomy tube.  Ultimately she was weaned from mechanical ventilation and the trach was decannulated.  She is now currently off oxygen therapy.  She has been on Anoro 1 inhalation daily for the past 2 years.  Despite this she is getting progressively worse with increased dyspnea on exertion.  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  The patient denies much in the way of cough.  The symptoms are primarily dyspnea with exertion.  See shortness of breath assessment below  05/01/2020 Patient is seen in return follow-up and this is a telephone visit as she was not able to achieve a video visit.  The patient recent pulmonary functions in her FEV1 is now improved to 82% predicted from 61% predicted in 2020.  Her FEF 2575 was at 45%.  Residual volume 124% predicted and diffusion capacity of 58% predicted.  This is consistent with stage I COPD and she does states she is improved on the Anoro.  We were unable to get her inhaled steroid from her insurance company.  The patient's not on oxygen therapy at this time.  She was started on atorvastatin and low-dose aspirin for hyperlipidemia and she has had increased headache and some emesis on the atorvastatin she is holding it at this time.  Note her chest x-ray in May was normal.  Her dyspnea is at baseline now. The patient has stopped smoking at this time.  07/03/2020 Patient is seen return follow-up and she is no longer smoking.  Breathing is better on current inhaled medications.  She has mild COPD with mild centrilobular emphysema does not require oxygen at this  time.  She has no other new complaints.   01/21/2021 Patient seen in return follow-up with mild COPD stage I but notes increased wheezing in the spring season.  Patient is on the Anoro at this time.  Note she had mild reduction in airway obstruction with PFTs in March 2021 and diffusion capacity was severely reduced.  Patient is no longer smoking at this time.  See shortness of breath assessment below.  03/24/21 Patient seen in return follow-up doing well overall having less shortness of breath maintaining Anoro 1 puff a day  Using albuterol as needed  Note she does have elevated cholesterol and previous history of non-STEMI Takotsubo cardiomyopathy  Patient does not have current chest pain or shortness of breath is questioning whether her hyperlipidemia needs to be treated she has been taking omega-3 fish oil and Metamucil for this and trying to follow a healthy lifestyle and dietary change  See shortness of breath assessment below she is improved   Shortness of Breath This is a chronic problem. The current episode started more than 1 year ago. The problem occurs every several days ( worse end of day,  DOE if rushes to do activity.  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 The problem has been rapidly improving. Pertinent negatives include no abdominal pain, chest pain, ear pain, fever, headaches, hemoptysis, leg pain, leg swelling, orthopnea, PND, rash, rhinorrhea, sore  throat, sputum production, swollen glands, vomiting or wheezing. The symptoms are aggravated by exercise, any activity, lying flat, odors, fumes and weather changes. Associated symptoms comments: Cough is tired or dif exertional  Heels hurt bilaterally  Some gerd. Risk factors include smoking. She has tried beta agonist inhalers, ipratropium inhalers and oral steroids for the symptoms. The treatment provided moderate relief. Her past medical history is significant for COPD.    Past Medical  History:  Diagnosis Date  . CHF (congestive heart failure) ([place])   . COPD (chronic obstructive pulmonary disease) ([place])   . NSTEMI (non-ST elevated myocardial infarction) ([place])    pt denies  . Takotsubo cardiomyopathy      Family History  Problem Relation Age of Onset  . Heart disease Mother        age 11's  . Hypertension Mother   . Pancreatic cancer Mother   . Aneurysm Father        brain  . Colon cancer Neg Hx   . Colon polyps Neg Hx   . Esophageal cancer Neg Hx   . Rectal cancer Neg Hx   . Stomach cancer Neg Hx      Social History   Socioeconomic History  . Marital status: Divorced    Spouse name: Not on file  . Number of children: Not on file  . Years of education: Not on file  . Highest education level: Not on file  Occupational History  . Occupation: CNA  Tobacco Use  . Smoking status: Former Smoker    Packs/day: 1.00    Types: Cigarettes    Quit date: 08/16/2017    Years since quitting: 3.6  . Smokeless tobacco: Never Used  . Tobacco comment: quit 07/12/17  Vaping Use  . Vaping Use: Never used  Substance and Sexual Activity  . Alcohol use: Yes    Comment: occasionally  . Drug use: Not Currently    Comment: Occasional marijuana quit 6 months ago   . Sexual activity: Yes    Birth control/protection: None  Other Topics Concern  . Not on file  Social History Narrative  . Not on file   Social Determinants of Health   Financial Resource Strain: Not on file  Food Insecurity: Not on file  Transportation Needs: Not on file  Physical Activity: Not on file  Stress: Not on file  Social Connections: Not on file  Intimate Partner Violence: Not on file     No Known Allergies   Outpatient Medications Prior to Visit  Medication Sig Dispense Refill  . albuterol (VENTOLIN HFA) 108 (90 Base) MCG/ACT inhaler Inhale 2 puffs into the lungs every 4 (four) hours as needed for wheezing or shortness of breath. 18 g 3  . umeclidinium-vilanterol (ANORO ELLIPTA)  62.5-25 MCG/INH AEPB Inhale 1 puff into the lungs daily.    . Fluticasone-Umeclidin-Vilant (TRELEGY ELLIPTA) 100-62.5-25 MCG/INH AEPB Inhale 1 puff into the lungs daily. (Patient not taking: Reported on 03/24/2021) 60 each 2   No facility-administered medications prior to visit.     Review of Systems  Constitutional: Negative for fever.  HENT: Negative for ear pain, rhinorrhea and sore throat.   Respiratory: Positive for shortness of breath. Negative for hemoptysis, sputum production and wheezing.   Cardiovascular: Negative for chest pain, orthopnea, leg swelling and PND.  Gastrointestinal: Negative for abdominal pain and vomiting.  Skin: Negative for rash.  Neurological: Negative for headaches.       Objective:   Physical Exam Vitals:   03/24/21 4825  BP: 124/84  Pulse: 62  Resp: 16  SpO2: 99%  Weight: 205 lb 6.4 oz (93.2 kg)  Height: 5\' 9"  (1.753 m)   Gen: Pleasant, well-nourished, in no distress,  normal affect  ENT: No lesions,  mouth clear,  oropharynx clear, no postnasal drip  Neck: No JVD, no TMG, no carotid bruits  Lungs: No use of accessory muscles, no dullness to percussion, distant breath sounds  Cardiovascular: RRR, heart sounds normal, no murmur or gallops, no peripheral edema  Abdomen: soft and NT, no HSM,  BS normal  Musculoskeletal: No deformities, no cyanosis or clubbing  Neuro: alert, non focal  Skin: Warm, no lesions or rashes       Assessment & Plan:  I personally reviewed all images and lab data in the [facility] system as well as any outside material available during this office visit and agree with the  radiology impressions.   Hyperlipidemia Hyperlipidemia HDL 160 previous history of NSTEMI  Coronary disease was negative on heart cath 2018  I would nevertheless begin low-dose atorvastatin 10 mg daily I sent the prescription in for the patient   Stage 1 mild COPD by GOLD classification ([place]) Gold stage I COPD stable at this time plan for  this patient is to maintain Anoro 1 puff daily   Taniyah was seen today for copd.  Diagnoses and all orders for this visit:  Mixed hyperlipidemia  Stage 1 mild COPD by GOLD classification ([place])  Other orders -     atorvastatin (LIPITOR) 10 MG tablet; Take 1 tablet (10 mg total) by mouth daily. -     umeclidinium-vilanterol (ANORO ELLIPTA) 62.5-25 MCG/INH AEPB; Inhale 1 puff into the lungs daily.

## 2021-03-24 ENCOUNTER — Ambulatory Visit: Payer: 59 | Attending: Critical Care Medicine | Admitting: Critical Care Medicine

## 2021-03-24 ENCOUNTER — Other Ambulatory Visit: Payer: Self-pay

## 2021-03-24 ENCOUNTER — Encounter: Payer: Self-pay | Admitting: Critical Care Medicine

## 2021-03-24 VITALS — BP 124/84 | HR 62 | Resp 16 | Ht 69.0 in | Wt 205.4 lb

## 2021-03-24 DIAGNOSIS — J449 Chronic obstructive pulmonary disease, unspecified: Secondary | ICD-10-CM

## 2021-03-24 DIAGNOSIS — E782 Mixed hyperlipidemia: Secondary | ICD-10-CM

## 2021-03-24 DIAGNOSIS — E785 Hyperlipidemia, unspecified: Secondary | ICD-10-CM | POA: Insufficient documentation

## 2021-03-24 MED ORDER — ANORO ELLIPTA 62.5-25 MCG/INH IN AEPB: 1.0000 | INHALATION_SPRAY | Freq: Every day | RESPIRATORY_TRACT | 4 refills | Status: DC

## 2021-03-24 MED ORDER — ATORVASTATIN CALCIUM 10 MG PO TABS
10.0000 mg | ORAL_TABLET | Freq: Every day | ORAL | 3 refills | Status: DC
Start: 2021-03-24 — End: 2021-09-24

## 2021-03-24 NOTE — Assessment & Plan Note (Signed)
Gold stage I COPD stable at this time plan for this patient is to maintain Anoro 1 puff daily

## 2021-03-24 NOTE — Assessment & Plan Note (Signed)
Hyperlipidemia HDL 160 previous history of NSTEMI  Coronary disease was negative on heart cath 2018  I would nevertheless begin low-dose atorvastatin 10 mg daily I sent the prescription in for the patient

## 2021-03-24 NOTE — Patient Instructions (Signed)
Stay on Anoro 1 puff a day refill sent to your pharmacy  Begin atorvastatin 1 pill a day sent to your local pharmacy  Return to see Dr. Joya Gaskins 6 months  Follow healthy diet as outlined below   [email or web address]">  DASH Eating Plan DASH stands for Dietary Approaches to Stop Hypertension. The DASH eating plan is a healthy eating plan that has been shown to:  Reduce high blood pressure (hypertension).  Reduce your risk for type 2 diabetes, heart disease, and stroke.  Help with weight loss. What are tips for following this plan? Reading food labels  Check food labels for the amount of salt (sodium) per serving. Choose foods with less than 5 percent of the Daily Value of sodium. Generally, foods with less than 300 milligrams (mg) of sodium per serving fit into this eating plan.  To find whole grains, look for the word "whole" as the first word in the ingredient list. Shopping  Buy products labeled as "low-sodium" or "no salt added."  Buy fresh foods. Avoid canned foods and pre-made or frozen meals. Cooking  Avoid adding salt when cooking. Use salt-free seasonings or herbs instead of table salt or sea salt. Check with your health care provider or pharmacist before using salt substitutes.  Do not fry foods. Cook foods using healthy methods such as baking, boiling, grilling, roasting, and broiling instead.  Cook with heart-healthy oils, such as olive, canola, avocado, soybean, or sunflower oil. Meal planning  Eat a balanced diet that includes: ? 4 or more servings of fruits and 4 or more servings of vegetables each day. Try to fill one-half of your plate with fruits and vegetables. ? 6-8 servings of whole grains each day. ? Less than 6 oz (170 g) of lean meat, poultry, or fish each day. A 3-oz (85-g) serving of meat is about the same size as a deck of cards. One egg equals 1 oz (28 g). ? 2-3 servings of low-fat dairy each day. One  serving is 1 cup (237 mL). ? 1 serving of nuts, seeds, or beans 5 times each week. ? 2-3 servings of heart-healthy fats. Healthy fats called omega-3 fatty acids are found in foods such as walnuts, flaxseeds, fortified milks, and eggs. These fats are also found in cold-water fish, such as sardines, salmon, and mackerel.  Limit how much you eat of: ? Canned or prepackaged foods. ? Food that is high in trans fat, such as some fried foods. ? Food that is high in saturated fat, such as fatty meat. ? Desserts and other sweets, sugary drinks, and other foods with added sugar. ? Full-fat dairy products.  Do not salt foods before eating.  Do not eat more than 4 egg yolks a week.  Try to eat at least 2 vegetarian meals a week.  Eat more home-cooked food and less restaurant, buffet, and fast food.   Lifestyle  When eating at a restaurant, ask that your food be prepared with less salt or no salt, if possible.  If you drink alcohol: ? Limit how much you use to:  0-1 drink a day for women who are not pregnant.  0-2 drinks a day for men. ? Be aware of how much alcohol is in your drink. In the [place] one drink equals one 12 oz bottle of beer (355 mL), one 5 oz glass of wine (148 mL), or one 1 oz glass of hard liquor (44 mL). General information  Avoid eating more than 2,300 mg of salt a day.  If you have hypertension, you may need to reduce your sodium intake to 1,500 mg a day.  Work with your health care provider to maintain a healthy body weight or to lose weight. Ask what an ideal weight is for you.  Get at least 30 minutes of exercise that causes your heart to beat faster (aerobic exercise) most days of the week. Activities may include walking, swimming, or biking.  Work with your health care provider or dietitian to adjust your eating plan to your individual calorie needs. What foods should I eat? Fruits All fresh, dried, or frozen fruit. Canned fruit in natural juice (without added  sugar). Vegetables Fresh or frozen vegetables (raw, steamed, roasted, or grilled). Low-sodium or reduced-sodium tomato and vegetable juice. Low-sodium or reduced-sodium tomato sauce and tomato paste. Low-sodium or reduced-sodium canned vegetables. Grains Whole-grain or whole-wheat bread. Whole-grain or whole-wheat pasta. Brown rice. [place]. Bulgur. Whole-grain and low-sodium cereals. Pita bread. Low-fat, low-sodium crackers. Whole-wheat flour tortillas. Meats and other proteins Skinless chicken or [place]. Ground chicken or [place]. Pork with fat trimmed off. Fish and seafood. Egg whites. Dried beans, peas, or lentils. Unsalted nuts, nut butters, and seeds. Unsalted canned beans. Lean cuts of beef with fat trimmed off. Low-sodium, lean precooked or cured meat, such as sausages or meat loaves. Dairy Low-fat (1%) or fat-free (skim) milk. Reduced-fat, low-fat, or fat-free cheeses. Nonfat, low-sodium ricotta or cottage cheese. Low-fat or nonfat yogurt. Low-fat, low-sodium cheese. Fats and oils Soft margarine without trans fats. Vegetable oil. Reduced-fat, low-fat, or light mayonnaise and salad dressings (reduced-sodium). Canola, safflower, olive, avocado, soybean, and sunflower oils. Avocado. Seasonings and condiments Herbs. Spices. Seasoning mixes without salt. Other foods Unsalted popcorn and pretzels. Fat-free sweets. The items listed above may not be a complete list of foods and beverages you can eat. Contact a dietitian for more information. What foods should I avoid? Fruits Canned fruit in a light or heavy syrup. Fried fruit. Fruit in cream or butter sauce. Vegetables Creamed or fried vegetables. Vegetables in a cheese sauce. Regular canned vegetables (not low-sodium or reduced-sodium). Regular canned tomato sauce and paste (not low-sodium or reduced-sodium). Regular tomato and vegetable juice (not low-sodium or reduced-sodium). Angie Fava. Olives. Grains Baked goods made with fat, such  as croissants, muffins, or some breads. Dry pasta or rice meal packs. Meats and other proteins Fatty cuts of meat. Ribs. Fried meat. Berniece Salines. Bologna, salami, and other precooked or cured meats, such as sausages or meat loaves. Fat from the back of a pig (fatback). Bratwurst. Salted nuts and seeds. Canned beans with added salt. Canned or smoked fish. Whole eggs or egg yolks. Chicken or [place] with skin. Dairy Whole or 2% milk, cream, and half-and-half. Whole or full-fat cream cheese. Whole-fat or sweetened yogurt. Full-fat cheese. Nondairy creamers. Whipped toppings. Processed cheese and cheese spreads. Fats and oils Butter. Stick margarine. Lard. Shortening. Ghee. Bacon fat. Tropical oils, such as coconut, palm kernel, or palm oil. Seasonings and condiments Onion salt, garlic salt, seasoned salt, table salt, and sea salt. Worcestershire sauce. Tartar sauce. Barbecue sauce. Teriyaki sauce. Soy sauce, including reduced-sodium. Steak sauce. Canned and packaged gravies. Fish sauce. Oyster sauce. Cocktail sauce. Store-bought horseradish. Ketchup. Mustard. Meat flavorings and tenderizers. Bouillon cubes. Hot sauces. Pre-made or packaged marinades. Pre-made or packaged taco seasonings. Relishes. Regular salad dressings. Other foods Salted popcorn and pretzels. The items listed above may not be a complete list of foods and beverages you should avoid. Contact a dietitian for more information. Where to find more information  National Heart, Lung, and Blood Institute: https://wilson-eaton.com/  American Heart Association: www.heart.org  Academy of Nutrition and Dietetics: www.eatright.[place]: www.kidney.org Summary  The DASH eating plan is a healthy eating plan that has been shown to reduce high blood pressure (hypertension). It may also reduce your risk for type 2 diabetes, heart disease, and stroke.  When on the DASH eating plan, aim to eat more fresh fruits and vegetables, whole  grains, lean proteins, low-fat dairy, and heart-healthy fats.  With the DASH eating plan, you should limit salt (sodium) intake to 2,300 mg a day. If you have hypertension, you may need to reduce your sodium intake to 1,500 mg a day.  Work with your health care provider or dietitian to adjust your eating plan to your individual calorie needs. This information is not intended to replace advice given to you by your health care provider. Make sure you discuss any questions you have with your health care provider. Document Revised: 10/06/2019 Document Reviewed: 10/06/2019 Elsevier Patient Education  2021 [employer].

## 2021-03-31 ENCOUNTER — Other Ambulatory Visit: Payer: Self-pay | Admitting: Internal Medicine

## 2021-03-31 ENCOUNTER — Other Ambulatory Visit (HOSPITAL_BASED_OUTPATIENT_CLINIC_OR_DEPARTMENT_OTHER): Payer: Self-pay | Admitting: Internal Medicine

## 2021-03-31 DIAGNOSIS — Z1231 Encounter for screening mammogram for malignant neoplasm of breast: Secondary | ICD-10-CM

## 2021-05-06 ENCOUNTER — Ambulatory Visit (HOSPITAL_BASED_OUTPATIENT_CLINIC_OR_DEPARTMENT_OTHER): Payer: 59 | Admitting: Radiology

## 2021-05-06 ENCOUNTER — Ambulatory Visit (HOSPITAL_BASED_OUTPATIENT_CLINIC_OR_DEPARTMENT_OTHER)
Admission: RE | Admit: 2021-05-06 | Discharge: 2021-05-06 | Disposition: A | Discharge status: Home / Self Care | Payer: 59 | Source: Ambulatory Visit | Attending: Internal Medicine | Admitting: Internal Medicine

## 2021-05-06 ENCOUNTER — Other Ambulatory Visit: Payer: Self-pay

## 2021-05-06 DIAGNOSIS — Z1231 Encounter for screening mammogram for malignant neoplasm of breast: Secondary | ICD-10-CM | POA: Insufficient documentation

## 2021-09-24 ENCOUNTER — Ambulatory Visit: Payer: 59 | Attending: Critical Care Medicine | Admitting: Critical Care Medicine

## 2021-09-24 ENCOUNTER — Encounter: Payer: Self-pay | Admitting: Critical Care Medicine

## 2021-09-24 ENCOUNTER — Other Ambulatory Visit: Payer: Self-pay

## 2021-09-24 VITALS — BP 130/85 | HR 55 | Resp 16 | Wt 212.4 lb

## 2021-09-24 DIAGNOSIS — J449 Chronic obstructive pulmonary disease, unspecified: Secondary | ICD-10-CM

## 2021-09-24 DIAGNOSIS — Z87891 Personal history of nicotine dependence: Secondary | ICD-10-CM

## 2021-09-24 DIAGNOSIS — J441 Chronic obstructive pulmonary disease with (acute) exacerbation: Secondary | ICD-10-CM | POA: Diagnosis not present

## 2021-09-24 DIAGNOSIS — J432 Centrilobular emphysema: Secondary | ICD-10-CM

## 2021-09-24 DIAGNOSIS — Z23 Encounter for immunization: Secondary | ICD-10-CM

## 2021-09-24 DIAGNOSIS — E782 Mixed hyperlipidemia: Secondary | ICD-10-CM

## 2021-09-24 DIAGNOSIS — Z122 Encounter for screening for malignant neoplasm of respiratory organs: Secondary | ICD-10-CM | POA: Insufficient documentation

## 2021-09-24 MED ORDER — ALBUTEROL SULFATE HFA 108 (90 BASE) MCG/ACT IN AERS: 2.0000 | INHALATION_SPRAY | RESPIRATORY_TRACT | 3 refills | Status: DC | PRN

## 2021-09-24 MED ORDER — UMECLIDINIUM-VILANTEROL 62.5-25 MCG/ACT IN AEPB
1.0000 | INHALATION_SPRAY | Freq: Every day | RESPIRATORY_TRACT | 6 refills | Status: DC
Start: 2021-09-24 — End: 2023-05-14

## 2021-09-24 MED ORDER — ATORVASTATIN CALCIUM 10 MG PO TABS: 10.0000 mg | ORAL_TABLET | Freq: Every day | ORAL | 3 refills | Status: DC

## 2021-09-24 NOTE — Assessment & Plan Note (Signed)
As per emphysema assessment

## 2021-09-24 NOTE — Assessment & Plan Note (Signed)
Lipitor refilled

## 2021-09-24 NOTE — Assessment & Plan Note (Signed)
Patient at risk for lung cancer greater than 30-pack-year history with COPD  Will order lung cancer screening study

## 2021-09-24 NOTE — Assessment & Plan Note (Signed)
As per smoking history

## 2021-09-24 NOTE — Patient Instructions (Signed)
No change in medications refill sent to your [facility]  Prevnar 20 pneumonia vaccine was given  We will arrange for you to obtain an appointment for primary care follow-up at [facility]  A low-dose lung cancer screening exam will be obtained to screen you for lung cancer we will do a prior authorization on this  Return to see Dr. Joya Gaskins 6 months

## 2021-09-24 NOTE — Progress Notes (Signed)
Established Patient Office Visit  Subjective:  Patient ID: Caitlin [name], female    DOB: [birth year]  Age: 55 y.o. MRN: [id number]  CC:  Chief Complaint  Patient presents with   COPD    HPI Caitlin [name] QQIWL presents for pulmonary follow-up of COPD.  Patient has mild to moderate COPD with primary involvement of the diffusion capacity and small airways.  Patient falls into the classification of a Golds A COPD  Patient has been doing well on Anoro 1 puff daily she uses it every day does not miss a dose.  She is not had to use the albuterol excessively.  She is not required any steroids.  Patient is already had her flu vaccine and is due a pneumonia booster vaccine  Past Medical History:  Diagnosis Date   CHF (congestive heart failure) (HCC)    COPD (chronic obstructive pulmonary disease) ([place])    NSTEMI (non-ST elevated myocardial infarction) ([place])    pt denies   Takotsubo cardiomyopathy     Past Surgical History:  Procedure Laterality Date   LEFT HEART CATH AND CORONARY ANGIOGRAPHY N/A 07/15/2017   Procedure: LEFT HEART CATH AND CORONARY ANGIOGRAPHY;  Surgeon: Martinique, Peter M, MD;  Location: [facility];  Service: Cardiovascular;  Laterality: N/A;   TRACHEOSTOMY CLOSURE  2018   WISDOM TOOTH EXTRACTION      Family History  Problem Relation Age of Onset   Heart disease Mother        age 38's   Hypertension Mother    Pancreatic cancer Mother    Aneurysm Father        brain   Colon cancer Neg Hx    Colon polyps Neg Hx    Esophageal cancer Neg Hx    Rectal cancer Neg Hx    Stomach cancer Neg Hx     Social History   Socioeconomic History   Marital status: Divorced    Spouse name: Not on file   Number of children: Not on file   Years of education: Not on file   Highest education level: Not on file  Occupational History   Occupation: CNA  Tobacco Use   Smoking status: Former    Packs/day: 1.00    Types: Cigarettes    Quit date: 08/16/2017    Years  since quitting: 4.1   Smokeless tobacco: Never   Tobacco comments:    quit 07/12/17  Vaping Use   Vaping Use: Never used  Substance and Sexual Activity   Alcohol use: Yes    Comment: occasionally   Drug use: Not Currently    Comment: Occasional marijuana quit 6 months ago    Sexual activity: Yes    Birth control/protection: None  Other Topics Concern   Not on file  Social History Narrative   Not on file   Social Determinants of Health   Financial Resource Strain: Not on file  Food Insecurity: Not on file  Transportation Needs: Not on file  Physical Activity: Not on file  Stress: Not on file  Social Connections: Not on file  Intimate Partner Violence: Not on file    Outpatient Medications Prior to Visit  Medication Sig Dispense Refill   albuterol (VENTOLIN HFA) 108 (90 Base) MCG/ACT inhaler Inhale 2 puffs into the lungs every 4 (four) hours as needed for wheezing or shortness of breath. 18 g 3   atorvastatin (LIPITOR) 10 MG tablet Take 1 tablet (10 mg total) by mouth daily. 90 tablet 3  umeclidinium-vilanterol (ANORO ELLIPTA) 62.5-25 MCG/INH AEPB Inhale 1 puff into the lungs daily. 60 each 4   No facility-administered medications prior to visit.    No Known Allergies  ROS Review of Systems  Constitutional:  Negative for chills, diaphoresis and fever.  HENT: Negative.  Negative for congestion, ear pain, hearing loss, nosebleeds, postnasal drip, rhinorrhea, sinus pressure, sinus pain, sneezing, sore throat and tinnitus.   Eyes:  Negative for photophobia and redness.  Respiratory:  Negative for cough, shortness of breath, wheezing and stridor.   Cardiovascular:  Negative for chest pain, palpitations and leg swelling.  Gastrointestinal:  Negative for abdominal pain, blood in stool, constipation, diarrhea, nausea and vomiting.  Endocrine: Negative for polydipsia.  Genitourinary:  Negative for dysuria, flank pain, frequency, hematuria and urgency.  Musculoskeletal:   Negative for back pain, myalgias and neck pain.  Skin:  Negative for rash.  Allergic/Immunologic: Negative for environmental allergies.  Neurological:  Negative for dizziness, tremors, seizures, weakness and headaches.  Hematological:  Does not bruise/bleed easily.  Psychiatric/Behavioral:  Negative for suicidal ideas. The patient is not nervous/anxious.      Objective:    Physical Exam Vitals reviewed.  Constitutional:      Appearance: Normal appearance. She is well-developed. She is not diaphoretic.  HENT:     Head: Normocephalic and atraumatic.     Nose: No nasal deformity, septal deviation, mucosal edema or rhinorrhea.     Right Sinus: No maxillary sinus tenderness or frontal sinus tenderness.     Left Sinus: No maxillary sinus tenderness or frontal sinus tenderness.     Mouth/Throat:     Pharynx: No oropharyngeal exudate.  Eyes:     General: No scleral icterus.    Conjunctiva/sclera: Conjunctivae normal.     Pupils: Pupils are equal, round, and reactive to light.  Neck:     Thyroid: No thyromegaly.     Vascular: No carotid bruit or JVD.     Trachea: Trachea normal. No tracheal tenderness or tracheal deviation.  Cardiovascular:     Rate and Rhythm: Normal rate and regular rhythm.     Chest Wall: PMI is not displaced.     Pulses: Normal pulses. No decreased pulses.     Heart sounds: Normal heart sounds, S1 normal and S2 normal. Heart sounds not distant. No murmur heard. No systolic murmur is present.  No diastolic murmur is present.    No friction rub. No gallop. No S3 or S4 sounds.  Pulmonary:     Effort: Pulmonary effort is normal. No tachypnea, accessory muscle usage or respiratory distress.     Breath sounds: Normal breath sounds. No stridor. No decreased breath sounds, wheezing, rhonchi or rales.  Chest:     Chest wall: No tenderness.  Abdominal:     General: Bowel sounds are normal. There is no distension.     Palpations: Abdomen is soft. Abdomen is not rigid.      Tenderness: There is no abdominal tenderness. There is no guarding or rebound.  Musculoskeletal:        General: Normal range of motion.     Cervical back: Normal range of motion and neck supple. No edema, erythema or rigidity. No muscular tenderness. Normal range of motion.  Lymphadenopathy:     Head:     Right side of head: No submental or submandibular adenopathy.     Left side of head: No submental or submandibular adenopathy.     Cervical: No cervical adenopathy.  Skin:    General:  Skin is warm and dry.     Coloration: Skin is not pale.     Findings: No rash.     Nails: There is no clubbing.  Neurological:     Mental Status: She is alert and oriented to person, place, and time.     Sensory: No sensory deficit.  Psychiatric:        Speech: Speech normal.        Behavior: Behavior normal.    BP 130/85   Pulse (!) 55   Resp 16   Wt 212 lb 6.4 oz (96.3 kg)   LMP 09/09/2014 (Approximate)   SpO2 99%   BMI 31.37 kg/m  Wt Readings from Last 3 Encounters:  09/24/21 212 lb 6.4 oz (96.3 kg)  03/24/21 205 lb 6.4 oz (93.2 kg)  03/17/21 202 lb 12.8 oz (92 kg)     Health Maintenance Due  Topic Date Due   Zoster Vaccines- Shingrix (1 of 2) Never done   Pneumococcal Vaccine 15-59 Years old (2 - PCV) 11/23/2019    There are no preventive care reminders to display for this patient.  Lab Results  Component Value Date   TSH 0.158 (L) 09/20/2017   Lab Results  Component Value Date   WBC 5.6 03/17/2021   HGB 13.4 03/17/2021   HCT 41.1 03/17/2021   MCV 84 03/17/2021   PLT 335 03/17/2021   Lab Results  Component Value Date   NA 141 03/17/2021   K 4.7 03/17/2021   CO2 18 (L) 03/17/2021   GLUCOSE 87 03/17/2021   BUN 9 03/17/2021   CREATININE 0.75 03/17/2021   BILITOT 0.2 03/17/2021   ALKPHOS 63 03/17/2021   AST 17 03/17/2021   ALT 17 03/17/2021   PROT 6.8 03/17/2021   ALBUMIN 4.5 03/17/2021   CALCIUM 9.6 03/17/2021   ANIONGAP 9 02/04/2018   EGFR 93 03/17/2021    Lab Results  Component Value Date   CHOL 218 (H) 03/17/2021   Lab Results  Component Value Date   HDL 45 03/17/2021   Lab Results  Component Value Date   LDLCALC 160 (H) 03/17/2021   Lab Results  Component Value Date   TRIG 73 03/17/2021   Lab Results  Component Value Date   CHOLHDL 4.8 (H) 03/17/2021   Lab Results  Component Value Date   HGBA1C 5.5 07/15/2017      Assessment & Plan:   Problem List Items Addressed This Visit       Respiratory   Centrilobular emphysema ([place]) - Primary    Centrilobular emphysema Gold stage A    No recent flares  Continue Anoro daily and albuterol as needed  Patient has quit smoking      Relevant Medications   albuterol (VENTOLIN HFA) 108 (90 Base) MCG/ACT inhaler   umeclidinium-vilanterol (ANORO ELLIPTA) 62.5-25 MCG/ACT AEPB   Stage 1 mild COPD by GOLD classification (HCC)    As per emphysema assessment      Relevant Medications   albuterol (VENTOLIN HFA) 108 (90 Base) MCG/ACT inhaler   umeclidinium-vilanterol (ANORO ELLIPTA) 62.5-25 MCG/ACT AEPB     Other   Stopped smoking with greater than 30 pack year history    Patient at risk for lung cancer greater than 30-pack-year history with COPD  Will order lung cancer screening study      Hyperlipidemia    Lipitor refilled      Relevant Medications   atorvastatin (LIPITOR) 10 MG tablet   Encounter for screening for lung cancer  As per smoking history      Relevant Orders   CT CHEST LUNG CA SCREEN LOW DOSE W/O CM   Other Visit Diagnoses     COPD with acute exacerbation ([place])       Relevant Medications   albuterol (VENTOLIN HFA) 108 (90 Base) MCG/ACT inhaler   umeclidinium-vilanterol (ANORO ELLIPTA) 62.5-25 MCG/ACT AEPB       Meds ordered this encounter  Medications   albuterol (VENTOLIN HFA) 108 (90 Base) MCG/ACT inhaler    Sig: Inhale 2 puffs into the lungs every 4 (four) hours as needed for wheezing or shortness of breath.    Dispense:  18 g     Refill:  3   atorvastatin (LIPITOR) 10 MG tablet    Sig: Take 1 tablet (10 mg total) by mouth daily.    Dispense:  90 tablet    Refill:  3   umeclidinium-vilanterol (ANORO ELLIPTA) 62.5-25 MCG/ACT AEPB    Sig: Inhale 1 puff into the lungs daily at 6 (six) AM.    Dispense:  60 each    Refill:  6   We will get this patient back into primary care early in 2023  Follow-up: Return in about 6 months (around 03/24/2022) for Needs new PCP at [facility]  former wall[facility] pt  needs appt early 2023.    Asencion Noble, MD

## 2021-09-24 NOTE — Assessment & Plan Note (Signed)
Centrilobular emphysema Gold stage A    No recent flares  Continue Anoro daily and albuterol as needed  Patient has quit smoking

## 2021-10-02 ENCOUNTER — Telehealth: Payer: Self-pay | Admitting: Critical Care Medicine

## 2021-10-02 NOTE — Telephone Encounter (Signed)
Notice of denial  [place] I ordered a low dose CT Scan for this pt who is a smoker and high risk lung CA  The form you gave me for appeal:  when I  call I get put in a long hold  I need an expedited way to appeal this, talk to a real person from [facility]

## 2021-10-03 NOTE — Telephone Encounter (Signed)
Contacted [facility] to schedule a peer-to-peer. Spoke to Caitlin [name] and the form was missing CPT code. Will need to refax new form with CPT code.

## 2021-10-06 ENCOUNTER — Encounter: Payer: Self-pay | Admitting: Critical Care Medicine

## 2021-10-06 ENCOUNTER — Ambulatory Visit (HOSPITAL_COMMUNITY): Payer: 59

## 2021-10-06 NOTE — Progress Notes (Signed)
Patient ID: Caitlin [name], female   DOB: 01-11-1966, 55 y.o.   MRN: [id number] I am requesting a low-dose noncontrast CT scan screening study to rule out lung cancer.  This patient has a history of centrilobular emphysema with moderate obstructive defect and severe reduction in DLCO as documented March 2020.  This patient has a greater than 30-pack-year smoking history.  I am wishing to obtain for this patient a screening low-dose lung cancer screening study which is approved by many and most health plans in [place].  Please take this under advisement.  If this is not approved by this health plan I will then go to a scholarship program for this patient.

## 2021-10-14 ENCOUNTER — Ambulatory Visit (HOSPITAL_COMMUNITY): Payer: 59

## 2021-10-28 ENCOUNTER — Emergency Department (HOSPITAL_BASED_OUTPATIENT_CLINIC_OR_DEPARTMENT_OTHER): Payer: 59 | Admitting: Radiology

## 2021-10-28 ENCOUNTER — Encounter (HOSPITAL_BASED_OUTPATIENT_CLINIC_OR_DEPARTMENT_OTHER): Payer: Self-pay | Admitting: Emergency Medicine

## 2021-10-28 ENCOUNTER — Other Ambulatory Visit: Payer: Self-pay

## 2021-10-28 ENCOUNTER — Emergency Department (HOSPITAL_BASED_OUTPATIENT_CLINIC_OR_DEPARTMENT_OTHER)
Admission: EM | Admit: 2021-10-28 | Discharge: 2021-10-28 | Disposition: A | Discharge status: Home / Self Care | Payer: 59 | Attending: Emergency Medicine | Admitting: Emergency Medicine

## 2021-10-28 DIAGNOSIS — I509 Heart failure, unspecified: Secondary | ICD-10-CM | POA: Insufficient documentation

## 2021-10-28 DIAGNOSIS — Z87891 Personal history of nicotine dependence: Secondary | ICD-10-CM | POA: Diagnosis not present

## 2021-10-28 DIAGNOSIS — J449 Chronic obstructive pulmonary disease, unspecified: Secondary | ICD-10-CM | POA: Diagnosis not present

## 2021-10-28 DIAGNOSIS — M25511 Pain in right shoulder: Secondary | ICD-10-CM | POA: Insufficient documentation

## 2021-10-28 MED ORDER — KETOROLAC TROMETHAMINE 60 MG/2ML IM SOLN
30.0000 mg | Freq: Once | INTRAMUSCULAR | Status: AC
  Administered 2021-10-28: 30 mg via INTRAMUSCULAR
  Filled 2021-10-28: qty 2

## 2021-10-28 MED ORDER — CYCLOBENZAPRINE HCL 10 MG PO TABS: 10.0000 mg | ORAL_TABLET | Freq: Two times a day (BID) | ORAL | 0 refills | Status: DC | PRN

## 2021-10-28 NOTE — Discharge Instructions (Addendum)
X-ray shows no fracture but does show some arthritis.  Suspect muscle spasm/arthritis flare.  Recommend 400 mg ibuprofen every 8 hours as needed for pain.  Recommend 1000 mg of Tylenol every 6 hours as needed for pain.  Use the sling for comfort.  I have provided you prescription for a muscle relaxant named Flexeril.  Please do not mix with alcohol or drugs.  Do not do any dangerous activities including driving while taking this medicine as it is mildly sedating.  Follow-up with sports medicine.

## 2021-10-28 NOTE — ED Triage Notes (Signed)
Pt arrives to ED with c/o right arm pain. This pain started after pt hurt her should after catching someone who was falling. The pt reports that her pain is constant and has worsened daily. Pt reports she developed tingling in her right arm x2 days ago.

## 2021-10-28 NOTE — ED Provider Notes (Signed)
[facility] EMERGENCY DEPT Provider Note   CSN: [id number] Arrival date & time: 10/28/21  1016     History Chief Complaint  Patient presents with   Arm Pain    Caitlin [name] is a 55 y.o. female.  Patient with right arm pain after injury last week.  Patient works with clients as Programmer, applications.  She states one of her clients fell back on her while doing transfer.  Pain mostly in the right shoulder.  Feels like she is having a spasm in the right shoulder area.  Having some tingling down her arms.  Denies any neck pain, headache, chest pain.  Pain is worse with movement.  The history is provided by the patient.  Arm Pain This is a new problem. The current episode started more than 2 days ago. The problem occurs daily. The problem has not changed since onset.Pertinent negatives include no chest pain, no abdominal pain and no shortness of breath. Exacerbated by: movement. The symptoms are relieved by acetaminophen. She has tried acetaminophen for the symptoms. The treatment provided mild relief.      Past Medical History:  Diagnosis Date   CHF (congestive heart failure) (HCC)    COPD (chronic obstructive pulmonary disease) (HCC)    NSTEMI (non-ST elevated myocardial infarction) ([place])    pt denies   Takotsubo cardiomyopathy     Patient Active Problem List   Diagnosis Date Noted   Encounter for screening for lung cancer 09/24/2021   Hyperlipidemia 03/24/2021   History of non-ST elevation myocardial infarction (NSTEMI) 04/17/2020   Lipoma of left axilla 06/13/2019   Centrilobular emphysema ([place]) 02/14/2019   Stopped smoking with greater than 30 pack year history 01/17/2019   Stage 1 mild COPD by GOLD classification ([place]) 01/17/2019   Takotsubo cardiomyopathy     Past Surgical History:  Procedure Laterality Date   LEFT HEART CATH AND CORONARY ANGIOGRAPHY N/A 07/15/2017   Procedure: LEFT HEART CATH AND CORONARY ANGIOGRAPHY;  Surgeon: Martinique, Peter M, MD;   Location: [facility];  Service: Cardiovascular;  Laterality: N/A;   TRACHEOSTOMY CLOSURE  2018   WISDOM TOOTH EXTRACTION       OB History     Gravida  2   Para      Term      Preterm      AB      Living  2      SAB      IAB      Ectopic      Multiple      Live Births  2           Family History  Problem Relation Age of Onset   Heart disease Mother        age 68's   Hypertension Mother    Pancreatic cancer Mother    Aneurysm Father        brain   Colon cancer Neg Hx    Colon polyps Neg Hx    Esophageal cancer Neg Hx    Rectal cancer Neg Hx    Stomach cancer Neg Hx     Social History   Tobacco Use   Smoking status: Former    Packs/day: 1.00    Types: Cigarettes    Quit date: 08/16/2017    Years since quitting: 4.2   Smokeless tobacco: Never   Tobacco comments:    quit 07/12/17  Vaping Use   Vaping Use: Never used  Substance Use Topics  Alcohol use: Yes    Comment: occasionally   Drug use: Not Currently    Comment: Occasional marijuana quit 6 months ago     Home Medications Prior to Admission medications   Medication Sig Start Date End Date Taking? Authorizing Provider  cyclobenzaprine (FLEXERIL) 10 MG tablet Take 1 tablet (10 mg total) by mouth 2 (two) times daily as needed for muscle spasms. 10/28/21  Yes [place] Lesure, DO  albuterol (VENTOLIN HFA) 108 (90 Base) MCG/ACT inhaler Inhale 2 puffs into the lungs every 4 (four) hours as needed for wheezing or shortness of breath. 09/24/21   Elsie Stain, MD  atorvastatin (LIPITOR) 10 MG tablet Take 1 tablet (10 mg total) by mouth daily. 09/24/21   Elsie Stain, MD  umeclidinium-vilanterol [facility] ELLIPTA) 62.5-25 MCG/ACT AEPB Inhale 1 puff into the lungs daily at 6 (six) AM. 09/24/21   Elsie Stain, MD    Allergies    Patient has no known allergies.  Review of Systems   Review of Systems  Constitutional:  Negative for chills and fever.  HENT:  Negative for ear pain and  sore throat.   Eyes:  Negative for pain and visual disturbance.  Respiratory:  Negative for cough and shortness of breath.   Cardiovascular:  Negative for chest pain and palpitations.  Gastrointestinal:  Negative for abdominal pain and vomiting.  Genitourinary:  Negative for dysuria and hematuria.  Musculoskeletal:  Positive for myalgias. Negative for arthralgias, back pain, neck pain and neck stiffness.  Skin:  Negative for color change and rash.  Neurological:  Negative for seizures, syncope, weakness, light-headedness and numbness.  All other systems reviewed and are negative.  Physical Exam Updated Vital Signs BP (!) 122/95 (BP Location: Right Arm)    Pulse 77    Temp 98.2 F (36.8 C) (Oral)    Resp 18    Ht 5\' 9"  (1.753 m)    Wt 92.5 kg    LMP 09/09/2014 (Approximate)    SpO2 98%    BMI 30.13 kg/m   Physical Exam Vitals and nursing note reviewed.  Constitutional:      General: She is not in acute distress.    Appearance: She is well-developed. She is not ill-appearing.  HENT:     Head: Normocephalic and atraumatic.  Eyes:     Conjunctiva/sclera: Conjunctivae normal.  Cardiovascular:     Rate and Rhythm: Normal rate and regular rhythm.     Heart sounds: No murmur heard. Pulmonary:     Effort: Pulmonary effort is normal. No respiratory distress.     Breath sounds: Normal breath sounds.  Abdominal:     Palpations: Abdomen is soft.     Tenderness: There is no abdominal tenderness.  Musculoskeletal:        General: Tenderness present. No swelling.     Cervical back: Normal range of motion and neck supple. No tenderness.     Comments: Tenderness in the right shoulder and right trapezius area with increased tone, good range of motion of the right upper extremity without much discomfort, no midline spinal tenderness  Skin:    General: Skin is warm and dry.     Capillary Refill: Capillary refill takes less than 2 seconds.  Neurological:     General: No focal deficit present.      Mental Status: She is alert and oriented to person, place, and time.     Cranial Nerves: No cranial nerve deficit.     Sensory: No sensory deficit.  Motor: No weakness.     Comments: 5+ out of 5 strength, normal sensation throughout  Psychiatric:        Mood and Affect: Mood normal.    ED Results / Procedures / Treatments   Labs (all labs ordered are listed, but only abnormal results are displayed) Labs Reviewed - No data to display  EKG EKG Interpretation  Date/Time:  Tuesday October 28 2021 10:29:50 EST Ventricular Rate:  77 PR Interval:  150 QRS Duration: 78 QT Interval:  368 QTC Calculation: 416 R Axis:   23 Text Interpretation: Normal sinus rhythm Low voltage QRS Confirmed by Ronnald Nian, Jusiah Aguayo ([id number]) on 10/28/2021 10:59:46 AM  Radiology DG Shoulder Right  Result Date: 10/28/2021 CLINICAL DATA:  Pain EXAM: RIGHT SHOULDER - 2+ VIEW COMPARISON:  None. FINDINGS: There is no evidence of acute fracture or dislocation. There is mild AC joint arthritis. There is minimal glenohumeral arthritis. IMPRESSION: No evidence of acute fracture or dislocation. 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 Electronically Signed   By: Maurine Simmering M.D.   On: 10/28/2021 11:34    Procedures Procedures   Medications Ordered in ED Medications  ketorolac (TORADOL) injection 30 mg (has no administration in time range)    ED Course  I have reviewed the triage vital signs and the nursing notes.  Pertinent labs & imaging results that were available during my care of the patient were reviewed by me and considered in my medical decision making (see chart for details).    MDM Rules/Calculators/A&P                           Neenah is here with right shoulder pain.  Normal vitals.  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  Pain mostly in the right shoulder.  X-ray negative for fracture.  Neurovascularly and neuromuscularly intact.  Suspect  arthritic/muscle spasm process.  Her trapezius muscles on the right are tight.  She has no midline spinal pain.  No weakness or numbness.  Doubt any cervical cord compression.  Neurologically she is intact.  Good pulses in the upper extremities.  No concern for ACS or cardiac process.  EKG shows sinus rhythm.  This seems to be reproducible pain and likely overuse process.  Recommend Tylenol, ibuprofen, Flexeril.  We will give her a sling for comfort.  We will have her follow-up with primary care or sports medicine.  Discharged in good condition.  This chart was dictated using voice recognition software.  Despite best efforts to proofread,  errors can occur which can change the documentation meaning.   Final Clinical Impression(s) / ED Diagnoses Final diagnoses:  Acute pain of right shoulder    Rx / DC Orders ED Discharge Orders          Ordered    cyclobenzaprine (FLEXERIL) 10 MG tablet  2 times daily PRN        10/28/21 1137             Tieasha Larsen, DO 10/28/21 1140

## 2021-10-30 ENCOUNTER — Emergency Department (HOSPITAL_BASED_OUTPATIENT_CLINIC_OR_DEPARTMENT_OTHER)
Admission: EM | Admit: 2021-10-30 | Discharge: 2021-10-30 | Disposition: A | Discharge status: Home / Self Care | Payer: 59 | Attending: Emergency Medicine | Admitting: Emergency Medicine

## 2021-10-30 ENCOUNTER — Other Ambulatory Visit: Payer: Self-pay

## 2021-10-30 ENCOUNTER — Encounter (HOSPITAL_BASED_OUTPATIENT_CLINIC_OR_DEPARTMENT_OTHER): Payer: Self-pay | Admitting: Emergency Medicine

## 2021-10-30 DIAGNOSIS — Z87891 Personal history of nicotine dependence: Secondary | ICD-10-CM | POA: Insufficient documentation

## 2021-10-30 DIAGNOSIS — M25511 Pain in right shoulder: Secondary | ICD-10-CM | POA: Diagnosis not present

## 2021-10-30 DIAGNOSIS — Z7952 Long term (current) use of systemic steroids: Secondary | ICD-10-CM | POA: Insufficient documentation

## 2021-10-30 DIAGNOSIS — J449 Chronic obstructive pulmonary disease, unspecified: Secondary | ICD-10-CM | POA: Diagnosis not present

## 2021-10-30 MED ORDER — KETOROLAC TROMETHAMINE 15 MG/ML IJ SOLN
15.0000 mg | Freq: Once | INTRAMUSCULAR | Status: DC
  Filled 2021-10-30: qty 1

## 2021-10-30 MED ORDER — KETOROLAC TROMETHAMINE 15 MG/ML IJ SOLN
15.0000 mg | Freq: Once | INTRAMUSCULAR | Status: AC
  Administered 2021-10-30: 15 mg via INTRAMUSCULAR

## 2021-10-30 NOTE — Discharge Instructions (Addendum)
You were given a Toradol shot in the emergency room.  Continue taking Tylenol and Motrin for your symptoms.  Continue wearing sling for comfort.  I recommend you call sports medicine clinic that you are given a referral to to schedule your hospital follow-up appointment.  If your pain significantly worsens, you develop weakness please return to the emergency room.

## 2021-10-30 NOTE — ED Provider Notes (Signed)
[facility] EMERGENC[facility] DEPT Provider Note   CSN: [id number] Arrival date & time: 10/30/21  5003     History Chief Complaint  Patient presents with   Arm Pain    Caitlin [name] is a 55 y.o. female.  55 year old female presents today for evaluation of right shoulder pain.  Patient was evaluated for same pain 12/13 and was discharged with symptomatic treatment and sling and referral to sports medicine.  Patient reports she suffered this injury while keeping a patient of her from falling down.  She denies fall or direct trauma to the site.  She says she has been receiving calls from sports medicine but has not scheduled an appointment.  She presents today because she has been taking Tylenol and Motrin without significant improvement.  She denies worsening in her symptoms since her previous visit.  Patient is right-hand dominant.  She has not been compliant with the sling.  The history is provided by the patient. No language interpreter was used.      Past Medical History:  Diagnosis Date   CHF (congestive heart failure) (HCC)    COPD (chronic obstructive pulmonary disease) (HCC)    NSTEMI (non-ST elevated myocardial infarction) ([place])    pt denies   Takotsubo cardiomyopathy     Patient Active Problem List   Diagnosis Date Noted   Encounter for screening for lung cancer 09/24/2021   Hyperlipidemia 03/24/2021   History of non-ST elevation myocardial infarction (NSTEMI) 04/17/2020   Lipoma of left axilla 06/13/2019   Centrilobular emphysema ([place]) 02/14/2019   Stopped smoking with greater than 30 pack year history 01/17/2019   Stage 1 mild COPD by GOLD classification ([place]) 01/17/2019   Takotsubo cardiomyopathy     Past Surgical History:  Procedure Laterality Date   LEFT HEART CATH AND CORONARY ANGIOGRAPHY N/A 07/15/2017   Procedure: LEFT HEART CATH AND CORONARY ANGIOGRAPHY;  Surgeon: Martinique, Peter M, MD;  Location: [facility];  Service: Cardiovascular;   Laterality: N/A;   TRACHEOSTOMY CLOSURE  2018   WISDOM TOOTH EXTRACTION       OB History     Gravida  2   Para      Term      Preterm      AB      Living  2      SAB      IAB      Ectopic      Multiple      Live Births  2           Family History  Problem Relation Age of Onset   Heart disease Mother        age 49's   Hypertension Mother    Pancreatic cancer Mother    Aneurysm Father        brain   Colon cancer Neg Hx    Colon polyps Neg Hx    Esophageal cancer Neg Hx    Rectal cancer Neg Hx    Stomach cancer Neg Hx     Social History   Tobacco Use   Smoking status: Former    Packs/day: 1.00    Types: Cigarettes    Quit date: 08/16/2017    Years since quitting: 4.2   Smokeless tobacco: Never   Tobacco comments:    quit 07/12/17  Vaping Use   Vaping Use: Never used  Substance Use Topics   Alcohol use: Yes    Comment: occasionally   Drug use: Not Currently  Comment: Occasional marijuana quit 6 months ago     Home Medications Prior to Admission medications   Medication Sig Start Date End Date Taking? Authorizing Provider  albuterol (VENTOLIN HFA) 108 (90 Base) MCG/ACT inhaler Inhale 2 puffs into the lungs every 4 (four) hours as needed for wheezing or shortness of breath. 09/24/21   Elsie Stain, MD  atorvastatin (LIPITOR) 10 MG tablet Take 1 tablet (10 mg total) by mouth daily. 09/24/21   Elsie Stain, MD  cyclobenzaprine (FLEXERIL) 10 MG tablet Take 1 tablet (10 mg total) by mouth 2 (two) times daily as needed for muscle spasms. 10/28/21   Curatolo, Adam, DO  umeclidinium-vilanterol (ANORO ELLIPTA) 62.5-25 MCG/ACT AEPB Inhale 1 puff into the lungs daily at 6 (six) AM. 09/24/21   Elsie Stain, MD    Allergies    Aspirin  Review of Systems   Review of Systems  Constitutional:  Negative for chills and fever.  Musculoskeletal:  Positive for arthralgias. Negative for joint swelling, neck pain and neck stiffness.   Neurological:  Negative for weakness and numbness.   Physical Exam Updated Vital Signs BP (!) 128/91 (BP Location: Left Arm)    Pulse 74    Temp 98.3 F (36.8 C)    Resp 18    Ht 5\' 9"  (1.753 m)    Wt 92.5 kg    LMP 09/09/2014 (Approximate)    SpO2 99%    BMI 30.13 kg/m   Physical Exam Vitals and nursing note reviewed.  Constitutional:      General: She is not in acute distress.    Appearance: Normal appearance. She is not ill-appearing.  HENT:     Head: Normocephalic and atraumatic.     Nose: Nose normal.  Eyes:     General: No scleral icterus.    Extraocular Movements: Extraocular movements intact.     Conjunctiva/sclera: Conjunctivae normal.  Cardiovascular:     Rate and Rhythm: Normal rate and regular rhythm.     Pulses: Normal pulses.     Heart sounds: Normal heart sounds.  Pulmonary:     Effort: Pulmonary effort is normal. No respiratory distress.     Breath sounds: Normal breath sounds. No wheezing or rales.  Abdominal:     General: There is no distension.     Tenderness: There is no abdominal tenderness.  Musculoskeletal:        General: Normal range of motion.     Cervical back: Normal range of motion.     Comments: Mild tenderness to palpation over right shoulder.  5/5 strength in bilateral upper extremities.  2+ radial pulse bilaterally.  Sensation intact bilaterally and symmetric.  Full range of motion in bilateral upper extremities.  Skin:    General: Skin is warm and dry.  Neurological:     General: No focal deficit present.     Mental Status: She is alert. Mental status is at baseline.    ED Results / Procedures / Treatments   Labs (all labs ordered are listed, but only abnormal results are displayed) Labs Reviewed - No data to display  EKG None  Radiology No results found.  Procedures Procedures   Medications Ordered in ED Medications  ketorolac (TORADOL) 15 MG/ML injection 15 mg (has no administration in time range)    ED Course  I have  reviewed the triage vital signs and the nursing notes.  Pertinent labs & imaging results that were available during my care of the patient were reviewed by  me and considered in my medical decision making (see chart for details).    MDM Rules/Calculators/A&P                           55 year old female presents today for evaluation of right shoulder pain.  Patient was seen for same symptoms 12/13 and was given symptomatic treatment and referral to sports medicine.  She has not scheduled this appointment yet.  She presents today because she has not had resolution of her symptoms with Tylenol and over-the-counter Motrin.  X-ray done at previous visit shows arthritis without acute fractures.  We will give patient Toradol shot but discussed importance of setting up a follow-up appointment with sports medicine or her PCP.  Right upper extremity neurovascularly intact.  She is without chest pain, palpitations, shortness of breath, radiculopathy.  She states she will return sports medicine call and schedule her appointment.  Return precautions discussed.  Patient voices understanding and is in agreement with plan. Final Clinical Impression(s) / ED Diagnoses Final diagnoses:  None    Rx / DC Orders ED Discharge Orders     None        Evlyn Courier, PA-C 10/30/21 [place], DO 10/31/21 1634

## 2021-10-30 NOTE — ED Triage Notes (Signed)
Pt arrives pov with c/o of continued right arm pain. Pt treated on Tuesday, rx for muscle relaxers. PT reports meds not controlling pain. Pt denies CP

## 2021-11-04 ENCOUNTER — Other Ambulatory Visit: Payer: Self-pay

## 2021-11-04 ENCOUNTER — Ambulatory Visit (HOSPITAL_COMMUNITY)
Admission: RE | Admit: 2021-11-04 | Discharge: 2021-11-04 | Disposition: A | Discharge status: Home / Self Care | Payer: 59 | Source: Ambulatory Visit | Attending: Critical Care Medicine | Admitting: Critical Care Medicine

## 2021-11-04 DIAGNOSIS — Z122 Encounter for screening for malignant neoplasm of respiratory organs: Secondary | ICD-10-CM | POA: Diagnosis not present

## 2021-11-05 ENCOUNTER — Other Ambulatory Visit: Payer: Self-pay | Admitting: Critical Care Medicine

## 2021-11-05 ENCOUNTER — Telehealth: Payer: Self-pay | Admitting: Critical Care Medicine

## 2021-11-05 DIAGNOSIS — R911 Solitary pulmonary nodule: Secondary | ICD-10-CM

## 2021-11-05 NOTE — Telephone Encounter (Signed)
Caitlin [name], from [facility], calling stating that the pt had a lung cancer screening CT and it was a Lung Rad 4B and it is suspicious. She states that there is a 15 millimeter left upper lobe nodule which is highly suspicious for primary bronchogenic neoplasm. Please advise.      [phone]

## 2021-11-05 NOTE — Telephone Encounter (Signed)
Pt aware of results and need for pulmonary consult which was sent for lung Bx

## 2021-12-08 ENCOUNTER — Ambulatory Visit (INDEPENDENT_AMBULATORY_CARE_PROVIDER_SITE_OTHER): Payer: Self-pay | Admitting: Pulmonary Disease

## 2021-12-08 ENCOUNTER — Encounter: Payer: Self-pay | Admitting: Pulmonary Disease

## 2021-12-08 ENCOUNTER — Ambulatory Visit (INDEPENDENT_AMBULATORY_CARE_PROVIDER_SITE_OTHER): Payer: Commercial Managed Care - HMO | Admitting: Pulmonary Disease

## 2021-12-08 ENCOUNTER — Other Ambulatory Visit: Payer: Self-pay

## 2021-12-08 VITALS — BP 112/72 | HR 81 | Temp 98.1°F | Ht 69.0 in | Wt 207.0 lb

## 2021-12-08 DIAGNOSIS — J432 Centrilobular emphysema: Secondary | ICD-10-CM

## 2021-12-08 DIAGNOSIS — J449 Chronic obstructive pulmonary disease, unspecified: Secondary | ICD-10-CM

## 2021-12-08 DIAGNOSIS — R911 Solitary pulmonary nodule: Secondary | ICD-10-CM

## 2021-12-08 LAB — PULMONARY FUNCTION TEST
DL/VA % pred: 63 %
DL/VA: 2.59 ml/min/mmHg/L
DLCO cor % pred: 56 %
DLCO cor: 13.71 ml/min/mmHg
DLCO unc % pred: 56 %
DLCO unc: 13.71 ml/min/mmHg
FEF 25-75 Post: 0.81 L/sec
FEF 25-75 Pre: 1.12 L/sec
FEF2575-%Change-Post: -28 %
FEF2575-%Pred-Post: 31 %
FEF2575-%Pred-Pre: 44 %
FEV1-%Change-Post: -6 %
FEV1-%Pred-Post: 74 %
FEV1-%Pred-Pre: 78 %
FEV1-Post: 1.95 L
FEV1-Pre: 2.07 L
FEV1FVC-%Change-Post: 0 %
FEV1FVC-%Pred-Pre: 78 %
FEV6-%Change-Post: -6 %
FEV6-%Pred-Post: 93 %
FEV6-%Pred-Pre: 100 %
FEV6-Post: 3.03 L
FEV6-Pre: 3.23 L
FEV6FVC-%Change-Post: 0 %
FEV6FVC-%Pred-Post: 100 %
FEV6FVC-%Pred-Pre: 100 %
FVC-%Change-Post: -6 %
FVC-%Pred-Post: 93 %
FVC-%Pred-Pre: 99 %
FVC-Post: 3.11 L
FVC-Pre: 3.31 L
Post FEV1/FVC ratio: 63 %
Post FEV6/FVC ratio: 97 %
Pre FEV1/FVC ratio: 63 %
Pre FEV6/FVC Ratio: 98 %
RV % pred: 137 %
RV: 3 L
TLC % pred: 114 %
TLC: 6.63 L

## 2021-12-08 NOTE — Patient Instructions (Addendum)
Thank you for visiting Dr. Valeta Harms at [facility]. Today we recommend the following:  Orders Placed This Encounter  Procedures   NM PET Image Initial (PI) Skull Base To Thigh (F-18 FDG)   Ambulatory referral to Cardiothoracic Surgery   Pulmonary Function Test   Appt soon to see Dr. Kipp Brood   Return in about 2 months (around 02/05/2022) for with APP or Dr. Valeta Harms.    Please do your part to reduce the spread of COVID-19.

## 2021-12-08 NOTE — Progress Notes (Signed)
Synopsis: Referred in Jen 2023 for lung nodule by Elsie Stain, MD  Subjective:   PATIENT ID: Caitlin [name] GENDER: female DOB: 01-05-1966, MRN: [id number]  Chief Complaint  Patient presents with   Consult    Patient is here to talk about spot on lungs.     This is a 56 year old female, past medical history of COPD, NSTEMI, history of tobacco abuse, patient quit smoking approximately 4 years ago.  She had a recent lung cancer screening CT on 11/06/2021.  This revealed a left upper lobe 15 mm spiculated nodule concerning for a primary malignancy.  Patient was referred to pulmonary for consultation by Dr. Joya Gaskins after being seen in the office.  Patient denies fevers night sweats weight loss.  Denies hemoptysis.  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   Past Medical History:  Diagnosis Date   CHF (congestive heart failure) (HCC)    COPD (chronic obstructive pulmonary disease) (HCC)    NSTEMI (non-ST elevated myocardial infarction) ([place])    pt denies   Takotsubo cardiomyopathy      Family History  Problem Relation Age of Onset   Heart disease Mother        age 63's   Hypertension Mother    Pancreatic cancer Mother    Aneurysm Father        brain   Colon cancer Neg Hx    Colon polyps Neg Hx    Esophageal cancer Neg Hx    Rectal cancer Neg Hx    Stomach cancer Neg Hx      Past Surgical History:  Procedure Laterality Date   LEFT HEART CATH AND CORONARY ANGIOGRAPHY N/A 07/15/2017   Procedure: LEFT HEART CATH AND CORONARY ANGIOGRAPHY;  Surgeon: Martinique, Peter M, MD;  Location: [facility];  Service: Cardiovascular;  Laterality: N/A;   TRACHEOSTOMY CLOSURE  2018   WISDOM TOOTH EXTRACTION      Social History   Socioeconomic History   Marital status: Divorced    Spouse name: Not on file   Number of children: Not on file   Years of education: Not on file    Highest education level: Not on file  Occupational History   Occupation: CNA  Tobacco Use   Smoking status: Former    Packs/day: 1.00    Types: Cigarettes    Quit date: 08/16/2017    Years since quitting: 4.3   Smokeless tobacco: Never   Tobacco comments:    quit 07/12/17  Vaping Use   Vaping Use: Never used  Substance and Sexual Activity   Alcohol use: Yes    Comment: occasionally   Drug use: Not Currently    Comment: Occasional marijuana quit 6 months ago    Sexual activity: Yes    Birth control/protection: None  Other Topics Concern   Not on file  Social History Narrative   Not on file   Social Determinants of Health   Financial Resource Strain: Not on file  Food Insecurity: Not on file  Transportation Needs: Not on file  Physical Activity: Not on file  Stress: Not on file  Social Connections: Not on file  Intimate Partner Violence: Not on file     Allergies  Allergen Reactions   Aspirin Nausea Only     Outpatient Medications Prior to Visit  Medication Sig Dispense Refill   albuterol (VENTOLIN HFA) 108 (90 Base) MCG/ACT inhaler Inhale  2 puffs into the lungs every 4 (four) hours as needed for wheezing or shortness of breath. 18 g 3   atorvastatin (LIPITOR) 10 MG tablet Take 1 tablet (10 mg total) by mouth daily. 90 tablet 3   cyclobenzaprine (FLEXERIL) 10 MG tablet Take 1 tablet (10 mg total) by mouth 2 (two) times daily as needed for muscle spasms. 20 tablet 0   umeclidinium-vilanterol (ANORO ELLIPTA) 62.5-25 MCG/ACT AEPB Inhale 1 puff into the lungs daily at 6 (six) AM. 60 each 6   No facility-administered medications prior to visit.    Review of Systems  Constitutional:  Negative for chills, fever, malaise/fatigue and weight loss.  HENT:  Negative for hearing loss, sore throat and tinnitus.   Eyes:  Negative for blurred vision and double vision.  Respiratory:  Negative for cough, hemoptysis, sputum production, shortness of breath, wheezing and stridor.    Cardiovascular:  Negative for chest pain, palpitations, orthopnea, leg swelling and PND.  Gastrointestinal:  Negative for abdominal pain, constipation, diarrhea, heartburn, nausea and vomiting.  Genitourinary:  Negative for dysuria, hematuria and urgency.  Musculoskeletal:  Negative for joint pain and myalgias.  Skin:  Negative for itching and rash.  Neurological:  Negative for dizziness, tingling, weakness and headaches.  Endo/Heme/Allergies:  Negative for environmental allergies. Does not bruise/bleed easily.  Psychiatric/Behavioral:  Negative for depression. The patient is not nervous/anxious and does not have insomnia.   All other systems reviewed and are negative.   Objective:  Physical Exam Vitals reviewed.  Constitutional:      General: She is not in acute distress.    Appearance: She is well-developed.  HENT:     Head: Normocephalic and atraumatic.  Eyes:     General: No scleral icterus.    Conjunctiva/sclera: Conjunctivae normal.     Pupils: Pupils are equal, round, and reactive to light.  Neck:     Vascular: No JVD.     Trachea: No tracheal deviation.  Cardiovascular:     Rate and Rhythm: Normal rate and regular rhythm.     Heart sounds: Normal heart sounds. No murmur heard. Pulmonary:     Effort: Pulmonary effort is normal. No tachypnea, accessory muscle usage or respiratory distress.     Breath sounds: No stridor. No wheezing, rhonchi or rales.  Abdominal:     General: There is no distension.     Palpations: Abdomen is soft.     Tenderness: There is no abdominal tenderness.  Musculoskeletal:        General: No tenderness.     Cervical back: Neck supple.  Lymphadenopathy:     Cervical: No cervical adenopathy.  Skin:    General: Skin is warm and dry.     Capillary Refill: Capillary refill takes less than 2 seconds.     Findings: No rash.  Neurological:     Mental Status: She is alert and oriented to person, place, and time.  Psychiatric:        Behavior:  Behavior normal.     Vitals:   12/08/21 0848  BP: 112/72  Pulse: 81  Temp: 98.1 F (36.7 C)  TempSrc: Oral  SpO2: 98%  Weight: 207 lb (93.9 kg)  Height: 5\' 9"  (1.753 m)   98% on RA BMI Readings from Last 3 Encounters:  12/08/21 30.57 kg/m  10/30/21 30.13 kg/m  10/28/21 30.13 kg/m   Wt Readings from Last 3 Encounters:  12/08/21 207 lb (93.9 kg)  10/30/21 204 lb (92.5 kg)  10/28/21 204 lb (92.5  kg)     CBC    Component Value Date/Time   WBC 5.6 03/17/2021 0930   WBC 7.7 02/04/2018 1015   RBC 4.89 03/17/2021 0930   RBC 4.45 02/04/2018 1015   HGB 13.4 03/17/2021 0930   HCT 41.1 03/17/2021 0930   PLT 335 03/17/2021 0930   MCV 84 03/17/2021 0930   MCH 27.4 03/17/2021 0930   MCH 27.9 02/04/2018 1015   MCHC 32.6 03/17/2021 0930   MCHC 33.2 02/04/2018 1015   RDW 13.8 03/17/2021 0930   LYMPHSABS 3.0 04/16/2020 1038   MONOABS 0.5 02/04/2018 1015   EOSABS 0.4 04/16/2020 1038   BASOSABS 0.0 04/16/2020 1038     Chest Imaging: 11/06/2021: Left upper lobe spiculated 15 mm pulmonary nodule concerning for primary bronchogenic carcinoma. The patient's images have been independently reviewed by me.    Pulmonary Functions Testing Results: PFT Results Latest Ref Rng & Units 04/08/2020  FVC-Pre L 3.51  FVC-Predicted Pre % 105  FVC-Post L 3.71  FVC-Predicted Post % 111  Pre FEV1/FVC % % 62  Post FEV1/FCV % % 61  FEV1-Pre L 2.19  FEV1-Predicted Pre % 82  FEV1-Post L 2.28  DLCO uncorrected ml/min/mmHg 14.17  DLCO UNC% % 58  DLVA Predicted % 66  TLC L 6.16  TLC % Predicted % 106  RV % Predicted % 124    FeNO:   Pathology:   Echocardiogram:   Heart Catheterization:     Assessment & Plan:     ICD-10-CM   1. Lung nodule  R91.1 Pulmonary Function Test    NM PET Image Initial (PI) Skull Base To Thigh (F-18 FDG)    Ambulatory referral to Cardiothoracic Surgery    2. Stage 1 mild COPD by GOLD classification ([place])  J44.9     3. Centrilobular emphysema ([place])   J43.2     4. Nodule of upper lobe of left lung 1.5CM spiculated  R91.1       Discussion:  This is a young 56 year old female, former tobacco abuse, quit 4 years ago enrolled in lung cancer screening CTs with Dr. Joya Gaskins.  Patient had a abnormal CT image on 11/06/2021 concerning for a left upper lobe primary bronchogenic carcinoma 15 mm in size.  Plan: Patient needs full PFTs complete. She will also need a PET scan completed. As long as the PET scan is reassuring for stage I malignancy and she has PFTs that are adequate for surgical resection then I think she would be a good candidate for consideration of direct resection or a combined procedure, robotic assisted bronchoscopy with dye marking and robotic assisted thoracic surgery for resection and lobectomy through single anesthetic event. I have placed a referral to cardiothoracic surgery to Dr. Kipp Brood.  I reviewed patient's images today in the office and discussed the process with her in detail.  All questions answered.  Continue Anora Ellipta and albuterol for COPD management    Current Outpatient Medications:    albuterol (VENTOLIN HFA) 108 (90 Base) MCG/ACT inhaler, Inhale 2 puffs into the lungs every 4 (four) hours as needed for wheezing or shortness of breath., Disp: 18 g, Rfl: 3   atorvastatin (LIPITOR) 10 MG tablet, Take 1 tablet (10 mg total) by mouth daily., Disp: 90 tablet, Rfl: 3   cyclobenzaprine (FLEXERIL) 10 MG tablet, Take 1 tablet (10 mg total) by mouth 2 (two) times daily as needed for muscle spasms., Disp: 20 tablet, Rfl: 0   umeclidinium-vilanterol (ANORO ELLIPTA) 62.5-25 MCG/ACT AEPB, Inhale 1 puff into  the lungs daily at 6 (six) AM., Disp: 60 each, Rfl: 6  I spent 63 minutes dedicated to the care of this patient on the date of this encounter to include pre-visit review of records, face-to-face time with the patient discussing conditions above, post visit ordering of testing, clinical documentation with the  electronic health record, making appropriate referrals as documented, and communicating necessary findings to members of the patients care team.   Garner Nash, [facility] 12/08/2021 9:19 AM

## 2021-12-08 NOTE — Progress Notes (Signed)
Full PFT completed today ? ?

## 2021-12-12 ENCOUNTER — Encounter: Payer: Medicaid Other | Admitting: Thoracic Surgery (Cardiothoracic Vascular Surgery)

## 2021-12-15 NOTE — Progress Notes (Signed)
[address]       [place] [address]             [facility] Record #[id number] Date of Birth: 1966/10/05  Referring: Garner Nash, DO Primary Care: Nicolette Bang, MD Primary Cardiologist: None  Chief Complaint:    Chief Complaint  Patient presents with   Lung Lesion    Surgical consult, PET Scan 12/16/21, Chest CT 11/04/22, PFT's 12/08/21    History of Present Illness:    Caitlin [name] 56 y.o. female referred for surgical evaluation of a 15 mm left upper lobe spiculated nodule.  This was found on lung cancer screening CT.  She does have a history of COPD, and was intubated in the past for this.  She also has a history of cardiomyopathy.  She underwent a PET/CT which showed increased avidity and the nodule along with her right axilla.  She denies any shortness of breath or chest pain.  She denies any neurologic symptoms.  Her weight has been stable.      Zubrod Score: At the time of surgery this patients most appropriate activity status/level should be described as: [x]     0    Normal activity, no symptoms []     1    Restricted in physical strenuous activity but ambulatory, able to do out light work []     2    Ambulatory and capable of self care, unable to do work activities, up and about               >50 % of waking hours                              []     3    Only limited self care, in bed greater than 50% of waking hours []     4    Completely disabled, no self care, confined to bed or chair []     5    Moribund   Past Medical History:  Diagnosis Date   CHF (congestive heart failure) (HCC)    COPD (chronic obstructive pulmonary disease) ([place])    NSTEMI (non-ST elevated myocardial infarction) ([place])    pt denies   Takotsubo cardiomyopathy     Past Surgical History:  Procedure Laterality Date   LEFT HEART CATH AND CORONARY ANGIOGRAPHY N/A 07/15/2017   Procedure:  LEFT HEART CATH AND CORONARY ANGIOGRAPHY;  Surgeon: Martinique, Peter M, MD;  Location: [facility];  Service: Cardiovascular;  Laterality: N/A;   TRACHEOSTOMY CLOSURE  2018   WISDOM TOOTH EXTRACTION      Family History  Problem Relation Age of Onset   Heart disease Mother        age 40's   Hypertension Mother    Pancreatic cancer Mother    Aneurysm Father        brain   Colon cancer Neg Hx    Colon polyps Neg Hx    Esophageal cancer Neg Hx    Rectal cancer Neg Hx    Stomach cancer Neg Hx      Social History   Tobacco Use  Smoking Status Former   Packs/day: 1.00   Types: Cigarettes   Quit date: 08/16/2017   Years since quitting: 4.3  Smokeless Tobacco Never  Tobacco Comments   quit 07/12/17    Social History   Substance and Sexual Activity  Alcohol Use Yes   Comment: occasionally     Allergies  Allergen Reactions   Aspirin Nausea Only    Current Outpatient Medications  Medication Sig Dispense Refill   albuterol (VENTOLIN HFA) 108 (90 Base) MCG/ACT inhaler Inhale 2 puffs into the lungs every 4 (four) hours as needed for wheezing or shortness of breath. 18 g 3   atorvastatin (LIPITOR) 10 MG tablet Take 1 tablet (10 mg total) by mouth daily. 90 tablet 3   umeclidinium-vilanterol (ANORO ELLIPTA) 62.5-25 MCG/ACT AEPB Inhale 1 puff into the lungs daily at 6 (six) AM. 60 each 6   No current facility-administered medications for this visit.    Review of Systems  Constitutional: Negative.   Respiratory:  Negative for cough and shortness of breath.   Cardiovascular: Negative.   Neurological: Negative.     PHYSICAL EXAMINATION: BP (!) 155/82 (BP Location: Right Arm, Patient Position: Sitting)    Pulse 71    Resp 20    Ht 5\' 9"  (1.753 m)    Wt 207 lb (93.9 kg)    LMP 09/09/2014 (Approximate)    SpO2 95% Comment: RA   BMI 30.57 kg/m  Physical Exam Constitutional:      General: She is not in acute distress.    Appearance: Normal appearance. She is normal weight.  She is not ill-appearing.  HENT:     Head: Normocephalic and atraumatic.  Cardiovascular:     Rate and Rhythm: Normal rate.     Heart sounds: No murmur heard. Pulmonary:     Effort: Pulmonary effort is normal. No respiratory distress.     Breath sounds: Normal breath sounds.  Abdominal:     General: Abdomen is flat. There is no distension.  Musculoskeletal:        General: Normal range of motion.     Cervical back: Normal range of motion.  Skin:    General: Skin is warm and dry.  Neurological:     General: No focal deficit present.     Mental Status: She is alert and oriented to person, place, and time.    Diagnostic Studies & Laboratory data:     Recent Radiology Findings:   NM PET Image Initial (PI) Skull Base To Thigh (F-18 FDG)  Result Date: 12/16/2021 CLINICAL DATA:  Initial treatment strategy for lung nodule. EXAM: NUCLEAR MEDICINE PET SKULL BASE TO THIGH TECHNIQUE: 10.0 mCi F-18 FDG was injected intravenously. Full-ring PET imaging was performed from the skull base to thigh after the radiotracer. CT data was obtained and used for attenuation correction and anatomic localization. Fasting blood glucose: 79 mg/dl COMPARISON:  Chest CT dated November 04, 2021 FINDINGS: Mediastinal blood pool activity: SUV max 2.7 Liver activity: SUV max 3.0 NECK: No hypermetabolic lymph nodes in the neck. Incidental CT findings: none CHEST: Solid pulmonary nodule of the left upper lobe with SUV max of 18.4 measuring approximately 1.5 x 1.0 cm on series 4, image 64, unchanged in size compared with prior exam. Hypermetabolic subcentimeter right axillary lymph nodes. Reference right axillary lymph node with SUV max of 8.1 measuring 8 mm in short axis on series 4, image 54. No hypermetabolic mediastinal, hilar or left axillary lymph nodes. Incidental CT findings: Atherosclerotic disease of the thoracic aorta. Small hiatal hernia. Centrilobular emphysema. ABDOMEN/PELVIS: No abnormal hypermetabolic activity  within the liver, pancreas, adrenal glands, or spleen. No hypermetabolic  lymph nodes in the abdomen or pelvis. Incidental CT findings: Atherosclerotic disease of the abdominal aorta. SKELETON: No focal hypermetabolic activity to suggest skeletal metastasis. Incidental CT findings: none IMPRESSION: 1. Solid hypermetabolic nodule of the left upper lobe, concerning for primary lung malignancy. 2. Contralateral hypermetabolic subcentimeter right axillary lymph nodes, possibly reactive. Recommend attention on follow-up. 3. No evidence of metastatic disease in the abdomen or pelvis. 4. Aortic Atherosclerosis (ICD10-I70.0) and Emphysema (ICD10-J43.9). Electronically Signed   By: Yetta Glassman M.D.   On: 12/16/2021 15:35       I have independently reviewed the above radiology studies  and reviewed the findings with the patient.   Recent Lab Findings: Lab Results  Component Value Date   WBC 5.6 03/17/2021   HGB 13.4 03/17/2021   HCT 41.1 03/17/2021   PLT 335 03/17/2021   GLUCOSE 87 03/17/2021   CHOL 218 (H) 03/17/2021   TRIG 73 03/17/2021   HDL 45 03/17/2021   LDLCALC 160 (H) 03/17/2021   ALT 17 03/17/2021   AST 17 03/17/2021   NA 141 03/17/2021   K 4.7 03/17/2021   CL 106 03/17/2021   CREATININE 0.75 03/17/2021   BUN 9 03/17/2021   CO2 18 (L) 03/17/2021   TSH 0.158 (L) 09/20/2017   INR 0.92 09/17/2017   HGBA1C 5.5 07/15/2017     PFTs:  - FVC: 99% - FEV1: 78% -DLCO: 56%  Problem List: 15 mm left upper lobe pulmonary nodule. History of COPD.  Prolonged ventilation requiring tracheostomy. History of heart failure.  Assessment / Plan:   56 yo female with LUL pulm nodule.  Concerning for primary lung cancer given her smoking history.  We discussed the risks and benefits of a combination procedure which would include a navigational bronchoscopy with biopsy and marking followed by robotic assisted left upper lobectomy.  The patient is agreeable to proceed.  She will require a stress  test prior to surgery.  This has been ordered.  I will coordinate on the schedule with Dr. Valeta Harms.        I  spent 40 minutes with  the patient face to face in counseling and coordination of care.    Lajuana Matte 12/19/2021 3:37 PM

## 2021-12-15 NOTE — H&P (View-Only) (Signed)
[address]       [place] [address]             [place] Record #[id number] Date of Birth: [birth year]  Referring: Garner Nash, DO Primary Care: Nicolette Bang, MD Primary Cardiologist: None  Chief Complaint:    Chief Complaint  Patient presents with   Lung Lesion    Surgical consult, PET Scan 12/16/21, Chest CT 11/04/22, PFT's 12/08/21    History of Present Illness:    Caitlin [name] 56 y.o. female referred for surgical evaluation of a 15 mm left upper lobe spiculated nodule.  This was found on lung cancer screening CT.  She does have a history of COPD, and was intubated in the past for this.  She also has a history of cardiomyopathy.  She underwent a PET/CT which showed increased avidity and the nodule along with her right axilla.  She denies any shortness of breath or chest pain.  She denies any neurologic symptoms.  Her weight has been stable.      Zubrod Score: At the time of surgery this patients most appropriate activity status/level should be described as: [x]     0    Normal activity, no symptoms []     1    Restricted in physical strenuous activity but ambulatory, able to do out light work []     2    Ambulatory and capable of self care, unable to do work activities, up and about               >50 % of waking hours                              []     3    Only limited self care, in bed greater than 50% of waking hours []     4    Completely disabled, no self care, confined to bed or chair []     5    Moribund   Past Medical History:  Diagnosis Date   CHF (congestive heart failure) (HCC)    COPD (chronic obstructive pulmonary disease) ([place])    NSTEMI (non-ST elevated myocardial infarction) ([place])    pt denies   Takotsubo cardiomyopathy     Past Surgical History:  Procedure Laterality Date   LEFT HEART CATH AND CORONARY ANGIOGRAPHY N/A 07/15/2017   Procedure:  LEFT HEART CATH AND CORONARY ANGIOGRAPHY;  Surgeon: Martinique, Peter M, MD;  Location: [facility];  Service: Cardiovascular;  Laterality: N/A;   TRACHEOSTOMY CLOSURE  2018   WISDOM TOOTH EXTRACTION      Family History  Problem Relation Age of Onset   Heart disease Mother        age 89's   Hypertension Mother    Pancreatic cancer Mother    Aneurysm Father        brain   Colon cancer Neg Hx    Colon polyps Neg Hx    Esophageal cancer Neg Hx    Rectal cancer Neg Hx    Stomach cancer Neg Hx      Social History   Tobacco Use  Smoking Status Former   Packs/day: 1.00   Types: Cigarettes   Quit date: 08/16/2017   Years since quitting: 4.3  Smokeless Tobacco Never  Tobacco Comments   quit 07/12/17    Social History   Substance and Sexual Activity  Alcohol Use Yes   Comment: occasionally     Allergies  Allergen Reactions   Aspirin Nausea Only    Current Outpatient Medications  Medication Sig Dispense Refill   albuterol (VENTOLIN HFA) 108 (90 Base) MCG/ACT inhaler Inhale 2 puffs into the lungs every 4 (four) hours as needed for wheezing or shortness of breath. 18 g 3   atorvastatin (LIPITOR) 10 MG tablet Take 1 tablet (10 mg total) by mouth daily. 90 tablet 3   umeclidinium-vilanterol (ANORO ELLIPTA) 62.5-25 MCG/ACT AEPB Inhale 1 puff into the lungs daily at 6 (six) AM. 60 each 6   No current facility-administered medications for this visit.    Review of Systems  Constitutional: Negative.   Respiratory:  Negative for cough and shortness of breath.   Cardiovascular: Negative.   Neurological: Negative.     PHYSICAL EXAMINATION: BP (!) 155/82 (BP Location: Right Arm, Patient Position: Sitting)    Pulse 71    Resp 20    Ht 5\' 9"  (1.753 m)    Wt 207 lb (93.9 kg)    LMP 09/09/2014 (Approximate)    SpO2 95% Comment: RA   BMI 30.57 kg/m  Physical Exam Constitutional:      General: She is not in acute distress.    Appearance: Normal appearance. She is normal weight.  She is not ill-appearing.  HENT:     Head: Normocephalic and atraumatic.  Cardiovascular:     Rate and Rhythm: Normal rate.     Heart sounds: No murmur heard. Pulmonary:     Effort: Pulmonary effort is normal. No respiratory distress.     Breath sounds: Normal breath sounds.  Abdominal:     General: Abdomen is flat. There is no distension.  Musculoskeletal:        General: Normal range of motion.     Cervical back: Normal range of motion.  Skin:    General: Skin is warm and dry.  Neurological:     General: No focal deficit present.     Mental Status: She is alert and oriented to person, place, and time.    Diagnostic Studies & Laboratory data:     Recent Radiology Findings:   NM PET Image Initial (PI) Skull Base To Thigh (F-18 FDG)  Result Date: 12/16/2021 CLINICAL DATA:  Initial treatment strategy for lung nodule. EXAM: NUCLEAR MEDICINE PET SKULL BASE TO THIGH TECHNIQUE: 10.0 mCi F-18 FDG was injected intravenously. Full-ring PET imaging was performed from the skull base to thigh after the radiotracer. CT data was obtained and used for attenuation correction and anatomic localization. Fasting blood glucose: 79 mg/dl COMPARISON:  Chest CT dated November 04, 2021 FINDINGS: Mediastinal blood pool activity: SUV max 2.7 Liver activity: SUV max 3.0 NECK: No hypermetabolic lymph nodes in the neck. Incidental CT findings: none CHEST: Solid pulmonary nodule of the left upper lobe with SUV max of 18.4 measuring approximately 1.5 x 1.0 cm on series 4, image 64, unchanged in size compared with prior exam. Hypermetabolic subcentimeter right axillary lymph nodes. Reference right axillary lymph node with SUV max of 8.1 measuring 8 mm in short axis on series 4, image 54. No hypermetabolic mediastinal, hilar or left axillary lymph nodes. Incidental CT findings: Atherosclerotic disease of the thoracic aorta. Small hiatal hernia. Centrilobular emphysema. ABDOMEN/PELVIS: No abnormal hypermetabolic activity  within the liver, pancreas, adrenal glands, or spleen. No hypermetabolic  lymph nodes in the abdomen or pelvis. Incidental CT findings: Atherosclerotic disease of the abdominal aorta. SKELETON: No focal hypermetabolic activity to suggest skeletal metastasis. Incidental CT findings: none IMPRESSION: 1. Solid hypermetabolic nodule of the left upper lobe, concerning for primary lung malignancy. 2. Contralateral hypermetabolic subcentimeter right axillary lymph nodes, possibly reactive. Recommend attention on follow-up. 3. No evidence of metastatic disease in the abdomen or pelvis. 4. Aortic Atherosclerosis (ICD10-I70.0) and Emphysema (ICD10-J43.9). Electronically Signed   By: Yetta Glassman M.D.   On: 12/16/2021 15:35       I have independently reviewed the above radiology studies  and reviewed the findings with the patient.   Recent Lab Findings: Lab Results  Component Value Date   WBC 5.6 03/17/2021   HGB 13.4 03/17/2021   HCT 41.1 03/17/2021   PLT 335 03/17/2021   GLUCOSE 87 03/17/2021   CHOL 218 (H) 03/17/2021   TRIG 73 03/17/2021   HDL 45 03/17/2021   LDLCALC 160 (H) 03/17/2021   ALT 17 03/17/2021   AST 17 03/17/2021   NA 141 03/17/2021   K 4.7 03/17/2021   CL 106 03/17/2021   CREATININE 0.75 03/17/2021   BUN 9 03/17/2021   CO2 18 (L) 03/17/2021   TSH 0.158 (L) 09/20/2017   INR 0.92 09/17/2017   HGBA1C 5.5 07/15/2017     PFTs:  - FVC: 99% - FEV1: 78% -DLCO: 56%  Problem List: 15 mm left upper lobe pulmonary nodule. History of COPD.  Prolonged ventilation requiring tracheostomy. History of heart failure.  Assessment / Plan:   56 yo female with LUL pulm nodule.  Concerning for primary lung cancer given her smoking history.  We discussed the risks and benefits of a combination procedure which would include a navigational bronchoscopy with biopsy and marking followed by robotic assisted left upper lobectomy.  The patient is agreeable to proceed.  She will require a stress  test prior to surgery.  This has been ordered.  I will coordinate on the schedule with Dr. Valeta Harms.        I  spent 40 minutes with  the patient face to face in counseling and coordination of care.    Lajuana Matte 12/19/2021 3:37 PM

## 2021-12-16 ENCOUNTER — Other Ambulatory Visit: Payer: Self-pay

## 2021-12-16 ENCOUNTER — Encounter (HOSPITAL_COMMUNITY)
Admission: RE | Admit: 2021-12-16 | Discharge: 2021-12-16 | Disposition: A | Discharge status: Home / Self Care | Payer: Commercial Managed Care - HMO | Source: Ambulatory Visit | Attending: Pulmonary Disease | Admitting: Pulmonary Disease

## 2021-12-16 DIAGNOSIS — R911 Solitary pulmonary nodule: Secondary | ICD-10-CM | POA: Diagnosis present

## 2021-12-16 LAB — GLUCOSE, CAPILLARY: Glucose-Capillary: 79 mg/dL (ref 70–99)

## 2021-12-16 MED ORDER — FLUDEOXYGLUCOSE F - 18 (FDG) INJECTION
10.2000 | Freq: Once | INTRAVENOUS | Status: AC | PRN
  Administered 2021-12-16: 10 via INTRAVENOUS

## 2021-12-19 ENCOUNTER — Encounter (HOSPITAL_COMMUNITY): Payer: Self-pay | Admitting: Thoracic Surgery (Cardiothoracic Vascular Surgery)

## 2021-12-19 ENCOUNTER — Other Ambulatory Visit: Payer: Self-pay

## 2021-12-19 ENCOUNTER — Telehealth (HOSPITAL_COMMUNITY): Payer: Self-pay | Admitting: *Deleted

## 2021-12-19 ENCOUNTER — Other Ambulatory Visit: Payer: Self-pay | Admitting: Thoracic Surgery (Cardiothoracic Vascular Surgery)

## 2021-12-19 ENCOUNTER — Ambulatory Visit (INDEPENDENT_AMBULATORY_CARE_PROVIDER_SITE_OTHER): Payer: Managed Care, Other (non HMO) | Admitting: Thoracic Surgery (Cardiothoracic Vascular Surgery)

## 2021-12-19 VITALS — BP 155/82 | HR 71 | Resp 20 | Ht 69.0 in | Wt 207.0 lb

## 2021-12-19 DIAGNOSIS — R911 Solitary pulmonary nodule: Secondary | ICD-10-CM

## 2021-12-19 NOTE — Telephone Encounter (Signed)
Close encounter 

## 2021-12-22 ENCOUNTER — Other Ambulatory Visit: Payer: Self-pay | Admitting: *Deleted

## 2021-12-22 ENCOUNTER — Encounter: Payer: Self-pay | Admitting: *Deleted

## 2021-12-22 DIAGNOSIS — R911 Solitary pulmonary nodule: Secondary | ICD-10-CM

## 2021-12-23 ENCOUNTER — Ambulatory Visit (HOSPITAL_COMMUNITY)
Admission: RE | Admit: 2021-12-23 | Discharge: 2021-12-23 | Disposition: A | Discharge status: Home / Self Care | Payer: Commercial Managed Care - HMO | Source: Ambulatory Visit | Attending: Cardiovascular Disease | Admitting: Cardiovascular Disease

## 2021-12-23 ENCOUNTER — Other Ambulatory Visit: Payer: Self-pay

## 2021-12-23 DIAGNOSIS — R911 Solitary pulmonary nodule: Secondary | ICD-10-CM | POA: Insufficient documentation

## 2021-12-23 DIAGNOSIS — Z01810 Encounter for preprocedural cardiovascular examination: Secondary | ICD-10-CM | POA: Diagnosis not present

## 2021-12-23 LAB — MYOCARDIAL PERFUSION IMAGING
LV dias vol: 92 mL (ref 46–106)
LV sys vol: 38 mL
Nuc Stress EF: 59 %
Peak HR: 97 {beats}/min
Rest HR: 54 {beats}/min
Rest Nuclear Isotope Dose: 10.4 mCi
SDS: 0
SRS: 0
SSS: 0
ST Depression (mm): 0 mm
Stress Nuclear Isotope Dose: 32 mCi
TID: 0.98

## 2021-12-23 MED ORDER — TECHNETIUM TC 99M TETROFOSMIN IV KIT
32.0000 | PACK | Freq: Once | INTRAVENOUS | Status: AC | PRN
  Administered 2021-12-23: 32 via INTRAVENOUS
  Filled 2021-12-23: qty 32

## 2021-12-23 MED ORDER — TECHNETIUM TC 99M TETROFOSMIN IV KIT
10.4000 | PACK | Freq: Once | INTRAVENOUS | Status: AC | PRN
  Administered 2021-12-23: 10.4 via INTRAVENOUS
  Filled 2021-12-23: qty 11

## 2021-12-23 MED ORDER — REGADENOSON 0.4 MG/5ML IV SOLN
0.4000 mg | Freq: Once | INTRAVENOUS | Status: AC
  Administered 2021-12-23: 0.4 mg via INTRAVENOUS

## 2021-12-24 ENCOUNTER — Other Ambulatory Visit: Payer: Self-pay

## 2021-12-24 DIAGNOSIS — R911 Solitary pulmonary nodule: Secondary | ICD-10-CM

## 2021-12-31 ENCOUNTER — Other Ambulatory Visit: Payer: Self-pay

## 2021-12-31 ENCOUNTER — Ambulatory Visit (HOSPITAL_COMMUNITY)
Admission: RE | Admit: 2021-12-31 | Discharge: 2021-12-31 | Disposition: A | Discharge status: Home / Self Care | Payer: Managed Care, Other (non HMO) | Source: Ambulatory Visit | Attending: Pulmonary Disease | Admitting: Pulmonary Disease

## 2021-12-31 DIAGNOSIS — R911 Solitary pulmonary nodule: Secondary | ICD-10-CM | POA: Diagnosis present

## 2022-01-05 NOTE — Progress Notes (Signed)
Surgical Instructions    Your procedure is scheduled on 01/08/22.  Report to [facility] Main Entrance "A" at 5:30 A.M., then check in with the Admitting office.  Call this number if you have problems the morning of surgery:  [phone]   If you have any questions prior to your surgery date call [phone]: Open Monday-Friday 8am-4pm    Remember:  Do not eat or drink after midnight the night before your surgery      Take these medicines the morning of surgery with A SIP OF WATER:  atorvastatin (LIPITOR)   AS NEEDED: albuterol (VENTOLIN HFA)  umeclidinium-vilanterol (ANORO ELLIPTA)  Bring inhalers day of surgery   As of today, STOP taking any Aspirin (unless otherwise instructed by your surgeon) Aleve, Naproxen, Ibuprofen, Motrin, Advil, Goody's, BC's, all herbal medications, fish oil, and all vitamins.           Do not wear jewelry or makeup Do not wear lotions, powders, perfumes or deodorant. Do not shave 48 hours prior to surgery.  Do not bring valuables to the hospital. Do not wear nail polish, gel polish, artificial nails, or any other type of covering on natural nails (fingers and toes) If you have artificial nails or gel coating that need to be removed by a nail salon, please have this removed prior to surgery. Artificial nails or gel coating may interfere with anesthesia's ability to adequately monitor your vital signs.  [facility] is not responsible for any belongings or valuables. .   Do NOT Smoke (Tobacco/Vaping)  24 hours prior to your procedure  If you use a CPAP at night, you may bring your mask for your overnight stay.   Contacts, glasses, hearing aids, dentures or partials may not be worn into surgery, please bring cases for these belongings   For patients admitted to the hospital, discharge time will be determined by your treatment team.   Patients discharged the day of surgery will not be allowed to drive home, and someone needs to stay with them for  24 hours.  NO VISITORS WILL BE ALLOWED IN PRE-OP WHERE PATIENTS ARE PREPPED FOR SURGERY.  ONLY 1 SUPPORT PERSON MAY BE PRESENT IN THE WAITING ROOM WHILE YOU ARE IN SURGERY.  IF YOU ARE TO BE ADMITTED, ONCE YOU ARE IN YOUR ROOM YOU WILL BE ALLOWED TWO (2) VISITORS. 1 (ONE) VISITOR MAY STAY OVERNIGHT BUT MUST ARRIVE TO THE ROOM BY 8pm.  Minor children may have two parents present. Special consideration for safety and communication needs will be reviewed on a case by case basis.  Special instructions:    Oral Hygiene is also important to reduce your risk of infection.  Remember - BRUSH YOUR TEETH THE MORNING OF SURGERY WITH YOUR REGULAR TOOTHPASTE   [facility]- Preparing For Surgery  Before surgery, you can play an important role. Because skin is not sterile, your skin needs to be as free of germs as possible. You can reduce the number of germs on your skin by washing with CHG (chlorahexidine gluconate) Soap before surgery.  CHG is an antiseptic cleaner which kills germs and bonds with the skin to continue killing germs even after washing.     Please do not use if you have an allergy to CHG or antibacterial soaps. If your skin becomes reddened/irritated stop using the CHG.  Do not shave (including legs and underarms) for at least 48 hours prior to first CHG shower. It is OK to shave your face.  Please follow these instructions  carefully.     Shower the NIGHT BEFORE SURGERY and the MORNING OF SURGERY with CHG Soap.   If you chose to wash your hair, wash your hair first as usual with your normal shampoo. After you shampoo, rinse your hair and body thoroughly to remove the shampoo.  Then [employer] and genitals (private parts) with your normal soap and rinse thoroughly to remove soap.  After that Use CHG Soap as you would any other liquid soap. You can apply CHG directly to the skin and wash gently with a scrungie or a clean washcloth.   Apply the CHG Soap to your body ONLY FROM THE NECK DOWN.  Do  not use on open wounds or open sores. Avoid contact with your eyes, ears, mouth and genitals (private parts). Wash Face and genitals (private parts)  with your normal soap.   Wash thoroughly, paying special attention to the area where your surgery will be performed.  Thoroughly rinse your body with warm water from the neck down.  DO NOT shower/wash with your normal soap after using and rinsing off the CHG Soap.  Pat yourself dry with a CLEAN TOWEL.  Wear CLEAN PAJAMAS to bed the night before surgery  Place CLEAN SHEETS on your bed the night before your surgery  DO NOT SLEEP WITH PETS.   Day of Surgery:  Take a shower with CHG soap. Wear Clean/Comfortable clothing the morning of surgery Do not apply any deodorants/lotions.   Remember to brush your teeth WITH YOUR REGULAR TOOTHPASTE.    COVID testing  If you are going to stay overnight or be admitted after your procedure/surgery and require a pre-op COVID test, please follow these instructions after your COVID test   You are not required to quarantine however you are required to wear a well-fitting mask when you are out and around people not in your household.  If your mask becomes wet or soiled, replace with a new one.  Wash your hands often with soap and water for 20 seconds or clean your hands with an alcohol-based hand sanitizer that contains at least 60% alcohol.  Do not share personal items.  Notify your provider: if you are in close contact with someone who has COVID  or if you develop a fever of 100.4 or greater, sneezing, cough, sore throat, shortness of breath or body aches.    Please read over the following fact sheets that you were given.

## 2022-01-06 ENCOUNTER — Encounter (HOSPITAL_COMMUNITY): Payer: Self-pay | Admitting: Pulmonary Disease

## 2022-01-06 ENCOUNTER — Encounter (HOSPITAL_COMMUNITY)
Admission: RE | Admit: 2022-01-06 | Discharge: 2022-01-06 | Disposition: A | Discharge status: Home / Self Care | Payer: Commercial Managed Care - HMO | Source: Ambulatory Visit | Attending: Pulmonary Disease | Admitting: Pulmonary Disease

## 2022-01-06 ENCOUNTER — Encounter (HOSPITAL_COMMUNITY): Payer: Self-pay

## 2022-01-06 ENCOUNTER — Other Ambulatory Visit: Payer: Self-pay

## 2022-01-06 VITALS — BP 109/80 | HR 81 | Temp 98.1°F | Resp 18 | Ht 69.0 in | Wt 206.0 lb

## 2022-01-06 DIAGNOSIS — J432 Centrilobular emphysema: Secondary | ICD-10-CM | POA: Insufficient documentation

## 2022-01-06 DIAGNOSIS — I700 Atherosclerosis of aorta: Secondary | ICD-10-CM | POA: Insufficient documentation

## 2022-01-06 DIAGNOSIS — Z01812 Encounter for preprocedural laboratory examination: Secondary | ICD-10-CM | POA: Insufficient documentation

## 2022-01-06 DIAGNOSIS — Z87891 Personal history of nicotine dependence: Secondary | ICD-10-CM | POA: Insufficient documentation

## 2022-01-06 DIAGNOSIS — R911 Solitary pulmonary nodule: Secondary | ICD-10-CM

## 2022-01-06 DIAGNOSIS — Z01818 Encounter for other preprocedural examination: Secondary | ICD-10-CM

## 2022-01-06 DIAGNOSIS — Z20822 Contact with and (suspected) exposure to covid-19: Secondary | ICD-10-CM | POA: Insufficient documentation

## 2022-01-06 LAB — URINALYSIS, ROUTINE W REFLEX MICROSCOPIC
Bilirubin Urine: NEGATIVE
Glucose, UA: NEGATIVE mg/dL
Hgb urine dipstick: NEGATIVE
Ketones, ur: NEGATIVE mg/dL
Nitrite: NEGATIVE
Protein, ur: NEGATIVE mg/dL
Specific Gravity, Urine: 1.019 (ref 1.005–1.030)
pH: 6 (ref 5.0–8.0)

## 2022-01-06 LAB — COMPREHENSIVE METABOLIC PANEL
ALT: 20 U/L (ref 0–44)
AST: 19 U/L (ref 15–41)
Albumin: 4 g/dL (ref 3.5–5.0)
Alkaline Phosphatase: 50 U/L (ref 38–126)
Anion gap: 9 (ref 5–15)
BUN: 10 mg/dL (ref 6–20)
CO2: 21 mmol/L — ABNORMAL LOW (ref 22–32)
Calcium: 9 mg/dL (ref 8.9–10.3)
Chloride: 108 mmol/L (ref 98–111)
Creatinine, Ser: 0.72 mg/dL (ref 0.44–1.00)
GFR, Estimated: >60 mL/min (ref >60)
Glucose, Bld: 91 mg/dL (ref 70–99)
Potassium: 3.8 mmol/L (ref 3.5–5.1)
Sodium: 138 mmol/L (ref 135–145)
Total Bilirubin: 0.4 mg/dL (ref 0.3–1.2)
Total Protein: 7.1 g/dL (ref 6.5–8.1)

## 2022-01-06 LAB — BLOOD GAS, ARTERIAL
Acid-base deficit: 0.2 mmol/L (ref 0.0–2.0)
Bicarbonate: 24 mmol/L (ref 20.0–28.0)
Drawn by: 58793
FIO2: 21 %
O2 Saturation: 98.9 %
Patient temperature: 37
pCO2 arterial: 37 mmHg (ref 32–48)
pH, Arterial: 7.42 (ref 7.35–7.45)
pO2, Arterial: 92 mmHg (ref 83–108)

## 2022-01-06 LAB — SURGICAL PCR SCREEN
MRSA, PCR: NEGATIVE
Staphylococcus aureus: NEGATIVE

## 2022-01-06 LAB — CBC
HCT: 39.3 % (ref 36.0–46.0)
Hemoglobin: 13.2 g/dL (ref 12.0–15.0)
MCH: 28.4 pg (ref 26.0–34.0)
MCHC: 33.6 g/dL (ref 30.0–36.0)
MCV: 84.5 fL (ref 80.0–100.0)
Platelets: 312 10*3/uL (ref 150–400)
RBC: 4.65 MIL/uL (ref 3.87–5.11)
RDW: 13.8 % (ref 11.5–15.5)
WBC: 6.8 10*3/uL (ref 4.0–10.5)
nRBC: 0 % (ref 0.0–0.2)

## 2022-01-06 LAB — SARS CORONAVIRUS 2 (TAT 6-24 HRS): SARS Coronavirus 2: NEGATIVE

## 2022-01-06 LAB — PROTIME-INR
INR: 1.1 (ref 0.8–1.2)
Prothrombin Time: 13.7 seconds (ref 11.4–15.2)

## 2022-01-06 LAB — APTT: aPTT: 28 seconds (ref 24–36)

## 2022-01-06 NOTE — Progress Notes (Signed)
PCP: Phill Myron, MD Cardiologist: Kirk Ruths, MD  EKG: 01/06/22 CXR: 01/06/22 ECHO:09/10/17 Stress Test: 12/23/21 Cardiac Cath: 07/15/17  Fasting Blood Sugar- na Checks Blood Sugar_na__ times a day  OSA/CPAP: NO  ASA/Blood Thinner: No  Covid test 01/06/22 at PAT  Anesthesia Review: yes, cardiac history  Patient denies shortness of breath, fever, cough, and chest pain at PAT appointment.  Patient verbalized understanding of instructions provided today at the PAT appointment.  Patient asked to review instructions at home and day of surgery.

## 2022-01-07 ENCOUNTER — Telehealth: Payer: Self-pay | Admitting: Pulmonary Disease

## 2022-01-07 NOTE — Telephone Encounter (Signed)
Levada Dy from [facility] is calling stating that patient has positive antibody screen and needs to know how many units of blood to have for procedure tomorrow. We may speak to anyone who answers.    Please advise

## 2022-01-07 NOTE — Progress Notes (Signed)
Anesthesia Chart Review:  History of takotsubo cardiomyopathy. Admitted and ruled in for MI 06/2017. Cath showed no CAD. Echo c/w takotsubo. FU echo 10/18 showed normal LV function, moderate TR, small pericardial effusion. Last seen by Dr. Stanford Breed 05/27/2018 and per note, no further therapy recommended at that point.   History of tobacco abuse, quit smoking ~4 years ago. Stage 1 mild COPD by GOLD classification. Recent lung cancer screening CT on 11/06/2021. This revealed a left upper lobe 15 mm spiculated nodule concerning for a primary malignancy.  Preop nuclear stress test ordered by Dr. Kipp Brood was low risk.  Preop labs reviewed, unremarkable.   EKG 01/06/22: NSR. Rate 70.  CT Super D Chest 12/31/21: IMPRESSION: 1. Persistent aggressive appearing left upper lobe pulmonary nodule measuring 2.0 x 1.2 x 1.4 cm highly concerning for primary bronchogenic neoplasm. 2. No definite lymphadenopathy or signs of metastatic disease in the thorax on today's noncontrast CT examination. 3. Diffuse bronchial wall thickening with moderate centrilobular and paraseptal emphysema; imaging findings suggestive of underlying COPD. 4. Trace amount of pericardial fluid and/or thickening, unlikely to be of hemodynamic significance, but new compared to the prior study. No pericardial calcifications. 5. Aortic atherosclerosis.   Nuclear stress 12/23/21:   The study is normal. Findings are consistent with no prior ischemia and no prior myocardial infarction. The study is low risk.   No ST deviation was noted.   LV perfusion is normal. There is no evidence of ischemia. There is no evidence of infarction.   Left ventricular function is normal. Nuclear stress EF: 59 %. The left ventricular ejection fraction is normal (55-65%). End diastolic cavity size is normal. End systolic cavity size is normal.   Prior study not available for comparison.    Wynonia Musty [facility] Short Stay Center/Anesthesiology Phone  [phone] 01/07/2022 9:49 AM

## 2022-01-07 NOTE — Anesthesia Preprocedure Evaluation (Addendum)
Anesthesia Evaluation  Patient identified by MRN, date of birth, ID band Patient awake    Reviewed: Allergy & Precautions, NPO status , Patient's Chart, lab work & pertinent test results  Airway Mallampati: II  TM Distance: >3 FB     Dental   Pulmonary COPD, former smoker,    breath sounds clear to auscultation       Cardiovascular + Past MI and +CHF   Rhythm:Regular Rate:Normal     Neuro/Psych    GI/Hepatic negative GI ROS, Neg liver ROS,   Endo/Other    Renal/GU negative Renal ROS     Musculoskeletal   Abdominal   Peds  Hematology   Anesthesia Other Findings   Reproductive/Obstetrics                            Anesthesia Physical Anesthesia Plan  ASA: 3  Anesthesia Plan: General   Post-op Pain Management:    Induction: Intravenous  PONV Risk Score and Plan: 3 and Ondansetron, Dexamethasone and Midazolam  Airway Management Planned: Oral ETT  Additional Equipment:   Intra-op Plan:   Post-operative Plan: Extubation in OR  Informed Consent: I have reviewed the patients History and Physical, chart, labs and discussed the procedure including the risks, benefits and alternatives for the proposed anesthesia with the patient or authorized representative who has indicated his/her understanding and acceptance.     Dental advisory given  Plan Discussed with: CRNA and Anesthesiologist  Anesthesia Plan Comments: (PAT note by Karoline Caldwell, PA-C:  History of takotsubo cardiomyopathy. Admitted and ruledinfor MI 06/2017. Cath showed no CAD. Echo c/w takotsubo. FU echo 10/18 showed normal LV function, moderate TR, small pericardial effusion. Last seen by Dr. Stanford Breed 05/27/2018 and per note, no further therapy recommended at that point.   History of tobacco abuse, quit smoking ~4 years ago. Stage 1 mild COPD by GOLD classification.Recent lung cancer screening CT on 11/06/2021. This revealed  a left upper lobe 15 mm spiculated nodule concerning for a primary malignancy.  Preop nuclear stress test ordered by Dr. Kipp Brood was low risk.  Preop labs reviewed, unremarkable.   EKG 01/06/22: NSR. Rate 70.  CT Super D Chest 12/31/21: IMPRESSION: 1. Persistent aggressive appearing left upper lobe pulmonary nodule measuring 2.0 x 1.2 x 1.4 cm highly concerning for primary bronchogenic neoplasm. 2. No definite lymphadenopathy or signs of metastatic disease in the thorax on today's noncontrast CT examination. 3. Diffuse bronchial wall thickening with moderate centrilobular and paraseptal emphysema; imaging findings suggestive of underlying COPD. 4. Trace amount of pericardial fluid and/or thickening, unlikely to be of hemodynamic significance, but new compared to the prior study. No pericardial calcifications. 5. Aortic atherosclerosis.  Nuclear stress 12/23/21:  The study is normal. Findings are consistent with no prior ischemia and no prior myocardial infarction. The study is low risk.  No ST deviation was noted.  LV perfusion is normal. There is no evidence of ischemia. There is no evidence of infarction.  Left ventricular function is normal. Nuclear stress EF: 59 %. The left ventricular ejection fraction is normal (55-65%). End diastolic cavity size is normal. End systolic cavity size is normal.  Prior study not available for comparison.   )       Anesthesia Quick Evaluation

## 2022-01-07 NOTE — Telephone Encounter (Signed)
Called and spoke with Levada Dy at the [facility] blood bank to let her know that would be a question for Dr. Kipp Brood. She said she would reach out to his office. Epic chat message also sent to [employer] RN and she said she would call the blood bank once she talks to Dr. Kipp Brood. Nothing further needed at this time.

## 2022-01-08 ENCOUNTER — Encounter (HOSPITAL_COMMUNITY)
Admission: RE | Disposition: A | Discharge status: Home / Self Care | Payer: Self-pay | Source: Home / Self Care | Attending: Thoracic Surgery (Cardiothoracic Vascular Surgery)

## 2022-01-08 ENCOUNTER — Encounter (HOSPITAL_COMMUNITY): Payer: Self-pay | Admitting: Pulmonary Disease

## 2022-01-08 ENCOUNTER — Inpatient Hospital Stay (HOSPITAL_COMMUNITY): Payer: Commercial Managed Care - HMO | Admitting: Certified Registered"

## 2022-01-08 ENCOUNTER — Other Ambulatory Visit: Payer: Self-pay

## 2022-01-08 ENCOUNTER — Inpatient Hospital Stay (HOSPITAL_COMMUNITY): Payer: Commercial Managed Care - HMO | Admitting: Physician Assistant

## 2022-01-08 ENCOUNTER — Inpatient Hospital Stay (HOSPITAL_COMMUNITY): Payer: Commercial Managed Care - HMO

## 2022-01-08 ENCOUNTER — Inpatient Hospital Stay (HOSPITAL_COMMUNITY)
Admission: RE | Admit: 2022-01-08 | Discharge: 2022-01-10 | DRG: 165 | Disposition: A | Discharge status: Home / Self Care | Payer: Commercial Managed Care - HMO | Attending: Thoracic Surgery (Cardiothoracic Vascular Surgery) | Admitting: Thoracic Surgery (Cardiothoracic Vascular Surgery)

## 2022-01-08 DIAGNOSIS — Z87891 Personal history of nicotine dependence: Secondary | ICD-10-CM | POA: Diagnosis not present

## 2022-01-08 DIAGNOSIS — Z902 Acquired absence of lung [part of]: Secondary | ICD-10-CM

## 2022-01-08 DIAGNOSIS — Z20822 Contact with and (suspected) exposure to covid-19: Secondary | ICD-10-CM | POA: Diagnosis present

## 2022-01-08 DIAGNOSIS — E785 Hyperlipidemia, unspecified: Secondary | ICD-10-CM | POA: Diagnosis present

## 2022-01-08 DIAGNOSIS — Z79899 Other long term (current) drug therapy: Secondary | ICD-10-CM

## 2022-01-08 DIAGNOSIS — J449 Chronic obstructive pulmonary disease, unspecified: Secondary | ICD-10-CM

## 2022-01-08 DIAGNOSIS — I509 Heart failure, unspecified: Secondary | ICD-10-CM | POA: Diagnosis not present

## 2022-01-08 DIAGNOSIS — C3412 Malignant neoplasm of upper lobe, left bronchus or lung: Principal | ICD-10-CM | POA: Diagnosis present

## 2022-01-08 DIAGNOSIS — Z886 Allergy status to analgesic agent status: Secondary | ICD-10-CM | POA: Diagnosis not present

## 2022-01-08 DIAGNOSIS — I252 Old myocardial infarction: Secondary | ICD-10-CM | POA: Diagnosis not present

## 2022-01-08 DIAGNOSIS — Z8249 Family history of ischemic heart disease and other diseases of the circulatory system: Secondary | ICD-10-CM | POA: Diagnosis not present

## 2022-01-08 DIAGNOSIS — I700 Atherosclerosis of aorta: Secondary | ICD-10-CM | POA: Diagnosis present

## 2022-01-08 DIAGNOSIS — Z419 Encounter for procedure for purposes other than remedying health state, unspecified: Secondary | ICD-10-CM

## 2022-01-08 DIAGNOSIS — Z800 Family history of malignant neoplasm of digestive organs: Secondary | ICD-10-CM

## 2022-01-08 DIAGNOSIS — R911 Solitary pulmonary nodule: Secondary | ICD-10-CM | POA: Diagnosis present

## 2022-01-08 DIAGNOSIS — J432 Centrilobular emphysema: Secondary | ICD-10-CM | POA: Diagnosis present

## 2022-01-08 DIAGNOSIS — Z9889 Other specified postprocedural states: Principal | ICD-10-CM

## 2022-01-08 HISTORY — PX: BRONCHIAL BRUSHINGS: SHX5108

## 2022-01-08 HISTORY — PX: VIDEO BRONCHOSCOPY WITH RADIAL ENDOBRONCHIAL ULTRASOUND: SHX6849

## 2022-01-08 HISTORY — PX: BRONCHIAL BIOPSY: SHX5109

## 2022-01-08 HISTORY — PX: NODE DISSECTION: SHX5269

## 2022-01-08 HISTORY — DX: Hyperlipidemia, unspecified: E78.5

## 2022-01-08 HISTORY — PX: INTERCOSTAL NERVE BLOCK: SHX5021

## 2022-01-08 HISTORY — PX: FIDUCIAL MARKER PLACEMENT: SHX6858

## 2022-01-08 HISTORY — PX: BRONCHIAL NEEDLE ASPIRATION BIOPSY: SHX5106

## 2022-01-08 LAB — ABO/RH: ABO/RH(D): A POS

## 2022-01-08 SURGERY — LOBECTOMY, LUNG, ROBOT-ASSISTED, USING VATS
Anesthesia: General | Site: Chest | Laterality: Left

## 2022-01-08 SURGERY — BRONCHOSCOPY, WITH BIOPSY USING ELECTROMAGNETIC NAVIGATION
Anesthesia: General

## 2022-01-08 MED ORDER — OXYCODONE HCL 5 MG PO TABS
5.0000 mg | ORAL_TABLET | ORAL | Status: DC | PRN
  Administered 2022-01-08 – 2022-01-09 (×2): 5 mg via ORAL
  Filled 2022-01-08 (×2): qty 1

## 2022-01-08 MED ORDER — PHENYLEPHRINE HCL-NACL 20-0.9 MG/250ML-% IV SOLN
INTRAVENOUS | Status: DC | PRN
  Administered 2022-01-08: 30 ug/min via INTRAVENOUS

## 2022-01-08 MED ORDER — GLYCOPYRROLATE 0.2 MG/ML IJ SOLN
INTRAMUSCULAR | Status: DC | PRN
  Administered 2022-01-08: .1 mg via INTRAVENOUS

## 2022-01-08 MED ORDER — FENTANYL CITRATE (PF) 250 MCG/5ML IJ SOLN
INTRAMUSCULAR | Status: DC | PRN
Start: 2022-01-08 — End: 2022-01-08
  Administered 2022-01-08 (×3): 50 ug via INTRAVENOUS
  Administered 2022-01-08: 150 ug via INTRAVENOUS

## 2022-01-08 MED ORDER — ACETAMINOPHEN 160 MG/5ML PO SOLN: 1000.0000 mg | Freq: Four times a day (QID) | ORAL | Status: DC

## 2022-01-08 MED ORDER — SENNOSIDES-DOCUSATE SODIUM 8.6-50 MG PO TABS
1.0000 | ORAL_TABLET | Freq: Every day | ORAL | Status: DC
  Administered 2022-01-08 – 2022-01-09 (×2): 1 via ORAL
  Filled 2022-01-08 (×2): qty 1

## 2022-01-08 MED ORDER — FENTANYL CITRATE (PF) 250 MCG/5ML IJ SOLN
INTRAMUSCULAR | Status: AC
  Filled 2022-01-08: qty 5

## 2022-01-08 MED ORDER — ACETAMINOPHEN 500 MG PO TABS
1000.0000 mg | ORAL_TABLET | Freq: Four times a day (QID) | ORAL | Status: DC
  Administered 2022-01-08 – 2022-01-10 (×7): 1000 mg via ORAL
  Filled 2022-01-08 (×7): qty 2

## 2022-01-08 MED ORDER — ROCURONIUM BROMIDE 10 MG/ML (PF) SYRINGE
PREFILLED_SYRINGE | INTRAVENOUS | Status: AC
  Filled 2022-01-08: qty 10

## 2022-01-08 MED ORDER — PHENYLEPHRINE 40 MCG/ML (10ML) SYRINGE FOR IV PUSH (FOR BLOOD PRESSURE SUPPORT)
PREFILLED_SYRINGE | INTRAVENOUS | Status: DC | PRN
  Administered 2022-01-08: 40 ug via INTRAVENOUS
  Administered 2022-01-08: 80 ug via INTRAVENOUS

## 2022-01-08 MED ORDER — LACTATED RINGERS IV SOLN: INTRAVENOUS | Status: DC | PRN

## 2022-01-08 MED ORDER — HYDROMORPHONE HCL 1 MG/ML IJ SOLN
0.2500 mg | INTRAMUSCULAR | Status: DC | PRN
  Administered 2022-01-08 (×3): 0.5 mg via INTRAVENOUS

## 2022-01-08 MED ORDER — MIDAZOLAM HCL 2 MG/2ML IJ SOLN
INTRAMUSCULAR | Status: DC | PRN
  Administered 2022-01-08 (×2): 1 mg via INTRAVENOUS

## 2022-01-08 MED ORDER — 0.9 % SODIUM CHLORIDE (POUR BTL) OPTIME
TOPICAL | Status: DC | PRN
  Administered 2022-01-08: 2000 mL

## 2022-01-08 MED ORDER — SODIUM CHLORIDE FLUSH 0.9 % IV SOLN
INTRAVENOUS | Status: DC | PRN
  Administered 2022-01-08: 100 mL

## 2022-01-08 MED ORDER — SUGAMMADEX SODIUM 200 MG/2ML IV SOLN
INTRAVENOUS | Status: DC | PRN
  Administered 2022-01-08: 200 mg via INTRAVENOUS

## 2022-01-08 MED ORDER — UMECLIDINIUM-VILANTEROL 62.5-25 MCG/ACT IN AEPB
1.0000 | INHALATION_SPRAY | Freq: Every day | RESPIRATORY_TRACT | Status: DC
  Administered 2022-01-10: 1 via RESPIRATORY_TRACT
  Filled 2022-01-08: qty 14

## 2022-01-08 MED ORDER — PROPOFOL 10 MG/ML IV BOLUS
INTRAVENOUS | Status: AC
  Filled 2022-01-08: qty 20

## 2022-01-08 MED ORDER — DEXAMETHASONE SODIUM PHOSPHATE 10 MG/ML IJ SOLN
INTRAMUSCULAR | Status: DC | PRN
  Administered 2022-01-08: 10 mg via INTRAVENOUS

## 2022-01-08 MED ORDER — BUPIVACAINE LIPOSOME 1.3 % IJ SUSP
INTRAMUSCULAR | Status: AC
  Filled 2022-01-08: qty 20

## 2022-01-08 MED ORDER — CHLORHEXIDINE GLUCONATE 0.12 % MT SOLN
OROMUCOSAL | Status: AC
  Administered 2022-01-08: 15 mL
  Filled 2022-01-08: qty 15

## 2022-01-08 MED ORDER — PROPOFOL 10 MG/ML IV BOLUS
INTRAVENOUS | Status: DC | PRN
  Administered 2022-01-08: 170 mg via INTRAVENOUS
  Administered 2022-01-08: 20 mg via INTRAVENOUS

## 2022-01-08 MED ORDER — PHENYLEPHRINE 40 MCG/ML (10ML) SYRINGE FOR IV PUSH (FOR BLOOD PRESSURE SUPPORT)
PREFILLED_SYRINGE | INTRAVENOUS | Status: AC
  Filled 2022-01-08: qty 10

## 2022-01-08 MED ORDER — MORPHINE SULFATE (PF) 2 MG/ML IV SOLN
2.0000 mg | INTRAVENOUS | Status: DC | PRN
  Administered 2022-01-08 (×2): 2 mg via INTRAVENOUS
  Filled 2022-01-08 (×2): qty 1

## 2022-01-08 MED ORDER — DEXAMETHASONE SODIUM PHOSPHATE 10 MG/ML IJ SOLN
INTRAMUSCULAR | Status: AC
  Filled 2022-01-08: qty 1

## 2022-01-08 MED ORDER — ONDANSETRON HCL 4 MG/2ML IJ SOLN
4.0000 mg | Freq: Four times a day (QID) | INTRAMUSCULAR | Status: DC | PRN
  Administered 2022-01-08 – 2022-01-10 (×2): 4 mg via INTRAVENOUS
  Filled 2022-01-08 (×2): qty 2

## 2022-01-08 MED ORDER — HYDROMORPHONE HCL 1 MG/ML IJ SOLN
INTRAMUSCULAR | Status: AC
  Filled 2022-01-08: qty 1

## 2022-01-08 MED ORDER — MIDAZOLAM HCL 2 MG/2ML IJ SOLN
INTRAMUSCULAR | Status: AC
  Filled 2022-01-08: qty 2

## 2022-01-08 MED ORDER — LIDOCAINE 2% (20 MG/ML) 5 ML SYRINGE
INTRAMUSCULAR | Status: DC | PRN
  Administered 2022-01-08: 40 mg via INTRAVENOUS

## 2022-01-08 MED ORDER — BISACODYL 5 MG PO TBEC: 10.0000 mg | DELAYED_RELEASE_TABLET | Freq: Every day | ORAL | Status: DC

## 2022-01-08 MED ORDER — TRAMADOL HCL 50 MG PO TABS: 50.0000 mg | ORAL_TABLET | Freq: Four times a day (QID) | ORAL | Status: DC | PRN

## 2022-01-08 MED ORDER — ONDANSETRON HCL 4 MG/2ML IJ SOLN
INTRAMUSCULAR | Status: AC
  Filled 2022-01-08: qty 2

## 2022-01-08 MED ORDER — CEFAZOLIN SODIUM-DEXTROSE 2-4 GM/100ML-% IV SOLN
2.0000 g | INTRAVENOUS | Status: AC
  Administered 2022-01-08: 2 g via INTRAVENOUS
  Filled 2022-01-08: qty 100

## 2022-01-08 MED ORDER — LUNG SURGERY BOOK
Freq: Once | Status: AC
  Filled 2022-01-08: qty 1

## 2022-01-08 MED ORDER — ATORVASTATIN CALCIUM 10 MG PO TABS
10.0000 mg | ORAL_TABLET | Freq: Every day | ORAL | Status: DC
  Administered 2022-01-09 – 2022-01-10 (×2): 10 mg via ORAL
  Filled 2022-01-08 (×2): qty 1

## 2022-01-08 MED ORDER — LIDOCAINE 2% (20 MG/ML) 5 ML SYRINGE
INTRAMUSCULAR | Status: AC
  Filled 2022-01-08: qty 5

## 2022-01-08 MED ORDER — ENOXAPARIN SODIUM 40 MG/0.4ML IJ SOSY
40.0000 mg | PREFILLED_SYRINGE | Freq: Every day | INTRAMUSCULAR | Status: DC
  Administered 2022-01-09: 40 mg via SUBCUTANEOUS
  Filled 2022-01-08: qty 0.4

## 2022-01-08 MED ORDER — CEFAZOLIN SODIUM-DEXTROSE 2-4 GM/100ML-% IV SOLN
2.0000 g | Freq: Three times a day (TID) | INTRAVENOUS | Status: AC
  Administered 2022-01-08 (×2): 2 g via INTRAVENOUS
  Filled 2022-01-08 (×2): qty 100

## 2022-01-08 MED ORDER — ALBUTEROL SULFATE (2.5 MG/3ML) 0.083% IN NEBU: 3.0000 mL | INHALATION_SOLUTION | RESPIRATORY_TRACT | Status: DC | PRN

## 2022-01-08 MED ORDER — METHYLENE BLUE 0.5 % INJ SOLN
INTRAVENOUS | Status: DC | PRN
  Administered 2022-01-08: 1 mL

## 2022-01-08 MED ORDER — BUPIVACAINE HCL (PF) 0.5 % IJ SOLN
INTRAMUSCULAR | Status: AC
  Filled 2022-01-08: qty 30

## 2022-01-08 MED ORDER — ONDANSETRON HCL 4 MG/2ML IJ SOLN
INTRAMUSCULAR | Status: DC | PRN
  Administered 2022-01-08: 4 mg via INTRAVENOUS

## 2022-01-08 MED ORDER — ROCURONIUM BROMIDE 10 MG/ML (PF) SYRINGE
PREFILLED_SYRINGE | INTRAVENOUS | Status: DC | PRN
  Administered 2022-01-08 (×2): 30 mg via INTRAVENOUS
  Administered 2022-01-08: 20 mg via INTRAVENOUS
  Administered 2022-01-08: 10 mg via INTRAVENOUS
  Administered 2022-01-08: 50 mg via INTRAVENOUS

## 2022-01-08 MED ORDER — KETOROLAC TROMETHAMINE 15 MG/ML IJ SOLN
15.0000 mg | Freq: Four times a day (QID) | INTRAMUSCULAR | Status: DC
  Administered 2022-01-08 – 2022-01-09 (×3): 15 mg via INTRAVENOUS
  Filled 2022-01-08 (×3): qty 1

## 2022-01-08 MED ORDER — METAMUCIL FIBER PO CHEW: CHEWABLE_TABLET | Freq: Every day | ORAL | Status: DC

## 2022-01-08 SURGICAL SUPPLY — 111 items
ADH SKN CLS APL DERMABOND .7 (GAUZE/BANDAGES/DRESSINGS) ×1
APL PRP STRL LF DISP 70% ISPRP (MISCELLANEOUS) ×1
BAG SPEC RTRVL C1550 15 (MISCELLANEOUS) ×1
BLADE SURG 11 STRL SS (BLADE) ×3 IMPLANT
CANISTER SUCT 3000ML PPV (MISCELLANEOUS) ×4 IMPLANT
CANNULA REDUC XI 12-8 STAPL (CANNULA) ×4
CANNULA REDUCER 12-8 DVNC XI (CANNULA) ×2 IMPLANT
CATH THORACIC 28FR (CATHETERS) ×1 IMPLANT
CATH TROCAR 20FR (CATHETERS) IMPLANT
CHLORAPREP W/TINT 26 (MISCELLANEOUS) ×2 IMPLANT
CNTNR URN SCR LID CUP LEK RST (MISCELLANEOUS) ×5 IMPLANT
CONN ST 1/4X3/8  BEN (MISCELLANEOUS)
CONN ST 1/4X3/8 BEN (MISCELLANEOUS) IMPLANT
CONT SPEC 4OZ STRL OR WHT (MISCELLANEOUS) ×10
DEFOGGER SCOPE WARMER CLEARIFY (MISCELLANEOUS) ×3 IMPLANT
DERMABOND ADVANCED (GAUZE/BANDAGES/DRESSINGS) ×1
DERMABOND ADVANCED .7 DNX12 (GAUZE/BANDAGES/DRESSINGS) ×1 IMPLANT
DISSECTOR BLUNT TIP ENDO 5MM (MISCELLANEOUS) IMPLANT
DRAIN CHANNEL 28F RND 3/8 FF (WOUND CARE) IMPLANT
DRAPE ARM DVNC X/XI (DISPOSABLE) ×4 IMPLANT
DRAPE COLUMN DVNC XI (DISPOSABLE) ×1 IMPLANT
DRAPE CV SPLIT W-CLR ANES SCRN (DRAPES) ×2 IMPLANT
DRAPE DA VINCI XI ARM (DISPOSABLE) ×8
DRAPE DA VINCI XI COLUMN (DISPOSABLE) ×2
DRAPE ORTHO SPLIT 77X108 STRL (DRAPES) ×2
DRAPE SURG ORHT 6 SPLT 77X108 (DRAPES) ×2 IMPLANT
ELECT BLADE 6.5 EXT (BLADE) IMPLANT
ELECT REM PT RETURN 9FT ADLT (ELECTROSURGICAL) ×2
ELECTRODE REM PT RTRN 9FT ADLT (ELECTROSURGICAL) ×2 IMPLANT
GAUZE KITTNER 4X8 (MISCELLANEOUS) ×4 IMPLANT
GAUZE SPONGE 4X4 12PLY STRL (GAUZE/BANDAGES/DRESSINGS) ×3 IMPLANT
GLOVE SURG ENC MOIS LTX SZ7.5 (GLOVE) ×4 IMPLANT
GOWN STRL REUS W/ TWL LRG LVL3 (GOWN DISPOSABLE) ×2 IMPLANT
GOWN STRL REUS W/ TWL XL LVL3 (GOWN DISPOSABLE) ×3 IMPLANT
GOWN STRL REUS W/TWL 2XL LVL3 (GOWN DISPOSABLE) ×3 IMPLANT
GOWN STRL REUS W/TWL LRG LVL3 (GOWN DISPOSABLE) ×6
GOWN STRL REUS W/TWL XL LVL3 (GOWN DISPOSABLE) ×6
HEMOSTAT SURGICEL 2X14 (HEMOSTASIS) ×5 IMPLANT
IRRIGATION STRYKERFLOW (MISCELLANEOUS) IMPLANT
IRRIGATOR STRYKERFLOW (MISCELLANEOUS)
KIT BASIN OR (CUSTOM PROCEDURE TRAY) ×3 IMPLANT
KIT TURNOVER KIT B (KITS) ×3 IMPLANT
NEEDLE 22X1 1/2 (OR ONLY) (NEEDLE) ×2 IMPLANT
NS IRRIG 1000ML POUR BTL (IV SOLUTION) ×9 IMPLANT
PACK CHEST (CUSTOM PROCEDURE TRAY) ×3 IMPLANT
PAD ARMBOARD 7.5X6 YLW CONV (MISCELLANEOUS) ×10 IMPLANT
PORT ACCESS TROCAR AIRSEAL 12 (TROCAR) ×2 IMPLANT
PORT ACCESS TROCAR AIRSEAL 5M (TROCAR) ×1
POUCH ENDO CATCH II 15MM (MISCELLANEOUS) IMPLANT
RELOAD STAPLE 45 2.0 GRY DVNC (STAPLE) IMPLANT
RELOAD STAPLE 45 2.5 WHT DVNC (STAPLE) IMPLANT
RELOAD STAPLE 45 3.5 BLU DVNC (STAPLE) IMPLANT
RELOAD STAPLE 45 4.3 GRN DVNC (STAPLE) IMPLANT
RELOAD STAPLER 2.5X45 WHT DVNC (STAPLE) ×3 IMPLANT
RELOAD STAPLER 3.5X45 BLU DVNC (STAPLE) ×4 IMPLANT
RELOAD STAPLER 4.3X45 GRN DVNC (STAPLE) ×2 IMPLANT
RETRACTOR WOUND ALXS 19CM XSML (INSTRUMENTS) IMPLANT
RTRCTR WOUND ALEXIS 19CM XSML (INSTRUMENTS)
SCISSORS LAP 5X35 DISP (ENDOMECHANICALS) IMPLANT
SEAL CANN UNIV 5-8 DVNC XI (MISCELLANEOUS) ×2 IMPLANT
SEAL XI 5MM-8MM UNIVERSAL (MISCELLANEOUS) ×4
SEALANT PROGEL (MISCELLANEOUS) IMPLANT
SEALER LIGASURE MARYLAND 30 (ELECTROSURGICAL) IMPLANT
SET TRI-LUMEN FLTR TB AIRSEAL (TUBING) ×2 IMPLANT
SOLUTION ELECTROLUBE (MISCELLANEOUS) ×1 IMPLANT
SPONGE INTESTINAL PEANUT (DISPOSABLE) IMPLANT
SPONGE T-LAP 18X18 ~~LOC~~+RFID (SPONGE) ×5 IMPLANT
SPONGE TONSIL TAPE 1 RFD (DISPOSABLE) IMPLANT
STAPLE RELOAD 45 2.0 GRAY (STAPLE) ×6
STAPLE RELOAD 45 2.0 GRAY DVNC (STAPLE) ×3 IMPLANT
STAPLER 45 SUREFORM CVD (STAPLE) ×2
STAPLER 45 SUREFORM CVD DVNC (STAPLE) IMPLANT
STAPLER CANNULA SEAL DVNC XI (STAPLE) ×2 IMPLANT
STAPLER CANNULA SEAL XI (STAPLE) ×4
STAPLER RELOAD 2.5X45 WHITE (STAPLE) ×6
STAPLER RELOAD 2.5X45 WHT DVNC (STAPLE) ×3
STAPLER RELOAD 3.5X45 BLU DVNC (STAPLE) ×4
STAPLER RELOAD 3.5X45 BLUE (STAPLE) ×8
STAPLER RELOAD 4.3X45 GREEN (STAPLE) ×4
STAPLER RELOAD 4.3X45 GRN DVNC (STAPLE) ×2
STOPCOCK 4 WAY LG BORE MALE ST (IV SETS) ×2 IMPLANT
SUT MNCRL AB 3-0 PS2 18 (SUTURE) IMPLANT
SUT MON AB 2-0 CT1 36 (SUTURE) IMPLANT
SUT PDS AB 1 CTX 36 (SUTURE) IMPLANT
SUT PROLENE 4 0 RB 1 (SUTURE)
SUT PROLENE 4-0 RB1 .5 CRCL 36 (SUTURE) IMPLANT
SUT SILK  1 MH (SUTURE) ×2
SUT SILK 1 MH (SUTURE) ×2 IMPLANT
SUT SILK 1 TIES 10X30 (SUTURE) IMPLANT
SUT SILK 2 0 SH (SUTURE) IMPLANT
SUT SILK 2 0SH CR/8 30 (SUTURE) IMPLANT
SUT VIC AB 1 CTX 36 (SUTURE)
SUT VIC AB 1 CTX36XBRD ANBCTR (SUTURE) IMPLANT
SUT VIC AB 2-0 CT1 27 (SUTURE) ×2
SUT VIC AB 2-0 CT1 TAPERPNT 27 (SUTURE) ×2 IMPLANT
SUT VIC AB 3-0 SH 27 (SUTURE) ×4
SUT VIC AB 3-0 SH 27X BRD (SUTURE) ×4 IMPLANT
SUT VICRYL 0 TIES 12 18 (SUTURE) ×2 IMPLANT
SUT VICRYL 0 UR6 27IN ABS (SUTURE) ×6 IMPLANT
SUT VICRYL 2 TP 1 (SUTURE) IMPLANT
SYR 10ML LL (SYRINGE) ×2 IMPLANT
SYR 20ML LL LF (SYRINGE) ×2 IMPLANT
SYR 50ML LL SCALE MARK (SYRINGE) ×2 IMPLANT
SYSTEM RETRIEVAL ANCHOR 15 (MISCELLANEOUS) ×1 IMPLANT
SYSTEM SAHARA CHEST DRAIN ATS (WOUND CARE) ×3 IMPLANT
TAPE CLOTH 4X10 WHT NS (GAUZE/BANDAGES/DRESSINGS) ×2 IMPLANT
TAPE CLOTH SURG 4X10 WHT LF (GAUZE/BANDAGES/DRESSINGS) ×1 IMPLANT
TIP APPLICATOR SPRAY EXTEND 16 (VASCULAR PRODUCTS) IMPLANT
TOWEL GREEN STERILE (TOWEL DISPOSABLE) ×2 IMPLANT
TRAY FOLEY MTR SLVR 16FR STAT (SET/KITS/TRAYS/PACK) ×3 IMPLANT
TUBING EXTENTION W/L.L. (IV SETS) ×3 IMPLANT

## 2022-01-08 NOTE — TOC Initial Note (Signed)
Transition of Care [facility]) - Initial/Assessment Note    Patient Details  Name: Analeise Mccleery MRN: [id number] Date of Birth: 08-19-1966  Transition of Care [facility]) CM/SW Contact:    Angelita Ingles, RN Phone [phone]  01/08/2022, 4:12 PM  Clinical Narrative:                  Transition of Care [facility]) Screening Note   Patient Details  Name: Argusta Mcgann Date of Birth: [birth year]   Transition of Care [facility]) CM/SW Contact:    Angelita Ingles, RN Phone Number: 01/08/2022, 4:13 PM    Transition of Care Department [facility]) has reviewed patient and no TOC needs have been identified at this time. We will continue to monitor patient advancement through interdisciplinary progression rounds. If new patient transition needs arise, please place a TOC consult.          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   Nodule of upper lobe of left lung 1.5CM spiculated 11/05/2021   Encounter for screening for lung cancer 09/24/2021   Hyperlipidemia 03/24/2021   History of non-ST elevation myocardial infarction (NSTEMI) 04/17/2020   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

## 2022-01-08 NOTE — Progress Notes (Signed)
Pt given K-Pad for back pain relief.

## 2022-01-08 NOTE — Anesthesia Procedure Notes (Signed)
Procedure Name: Intubation Date/Time: 01/08/2022 8:36 AM Performed by: Lavell Luster, CRNA Pre-anesthesia Checklist: Patient identified, Emergency Drugs available, Suction available and Patient being monitored Patient Re-evaluated:Patient Re-evaluated prior to induction Oxygen Delivery Method: Circle system utilized Preoxygenation: Pre-oxygenation with 100% oxygen Induction Type: Inhalational induction with existing ETT Laryngoscope Size: Mac and 3 Grade View: Grade I Tube type: Oral Endobronchial tube: Left, EBT position confirmed by auscultation, EBT position confirmed by fiberoptic bronchoscope and Double lumen EBT and 37 Fr Number of attempts: 1 Airway Equipment and Method: Stylet Placement Confirmation: ETT inserted through vocal cords under direct vision, positive ETCO2 and breath sounds checked- equal and bilateral Secured at: 30 cm Tube secured with: Tape Dental Injury: Teeth and Oropharynx as per pre-operative assessment  Comments: Prior ETT pulled out with no difficulty. DL x1 SRNA and confirmed with auscultation, EtCO2, and FOB

## 2022-01-08 NOTE — Anesthesia Procedure Notes (Signed)
Arterial Line Insertion Start/End2/23/2023 6:30 AM, 01/08/2022 6:50 AM Performed by: Belinda Block, MD, Lavell Luster, CRNA, CRNA  Patient location: Pre-op. Preanesthetic checklist: patient identified, IV checked, site marked, risks and benefits discussed, surgical consent, monitors and equipment checked, pre-op evaluation, timeout performed and anesthesia consent Lidocaine 1% used for infiltration and patient sedated Right, radial was placed Catheter size: 22 G Hand hygiene performed  and maximum sterile barriers used   Procedure performed without using ultrasound guided technique. Following insertion, dressing applied and Biopatch. Post procedure assessment: normal  Post procedure complications: unsuccessful attempts. Patient tolerated the procedure well with no immediate complications. Additional procedure comments: SRNA x1, CRNA x2 .

## 2022-01-08 NOTE — H&P (Signed)
Synopsis: Referred in Jen 2023 for lung nodule by Elsie Stain, MD   Subjective:    PATIENT ID: Caitlin [name] GENDER: female DOB: 1966-03-26, MRN: [id number]       Chief Complaint  Patient presents with   Consult      Patient is here to talk about spot on lungs.       This is a 56 year old female, past medical history of COPD, NSTEMI, history of tobacco abuse, patient quit smoking approximately 4 years ago.  She had a recent lung cancer screening CT on 11/06/2021.  This revealed a left upper lobe 15 mm spiculated nodule concerning for a primary malignancy.  Patient was referred to pulmonary for consultation by Dr. Joya Gaskins after being seen in the office.  Patient denies fevers night sweats weight loss.  Denies hemoptysis.  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  01/08/2022: Here today for planned bronchoscopy. Followed by inpatient admission for robotic assisted lobectomy by Dr. Kipp Brood.          Past Medical History:  Diagnosis Date   CHF (congestive heart failure) (HCC)     COPD (chronic obstructive pulmonary disease) (HCC)     NSTEMI (non-ST elevated myocardial infarction) ([place])      pt denies   Takotsubo cardiomyopathy             Family History  Problem Relation Age of Onset   Heart disease Mother          age 72's   Hypertension Mother     Pancreatic cancer Mother     Aneurysm Father          brain   Colon cancer Neg Hx     Colon polyps Neg Hx     Esophageal cancer Neg Hx     Rectal cancer Neg Hx     Stomach cancer Neg Hx             Past Surgical History:  Procedure Laterality Date   LEFT HEART CATH AND CORONARY ANGIOGRAPHY N/A 07/15/2017    Procedure: LEFT HEART CATH AND CORONARY ANGIOGRAPHY;  Surgeon: Martinique, Peter M, MD;  Location: [facility];  Service: Cardiovascular;  Laterality: N/A;   TRACHEOSTOMY CLOSURE   2018   WISDOM TOOTH  EXTRACTION          Social History         Socioeconomic History   Marital status: Divorced      Spouse name: Not on file   Number of children: Not on file   Years of education: Not on file   Highest education level: Not on file  Occupational History   Occupation: CNA  Tobacco Use   Smoking status: Former      Packs/day: 1.00      Types: Cigarettes      Quit date: 08/16/2017      Years since quitting: 4.3   Smokeless tobacco: Never   Tobacco comments:      quit 07/12/17  Vaping Use   Vaping Use: Never used  Substance and Sexual Activity   Alcohol use: Yes      Comment: occasionally   Drug use: Not Currently      Comment: Occasional marijuana quit 6 months ago    Sexual activity: Yes      Birth control/protection: None  Other Topics Concern   Not on file  Social  History Narrative   Not on file    Social Determinants of Health    Financial Resource Strain: Not on file  Food Insecurity: Not on file  Transportation Needs: Not on file  Physical Activity: Not on file  Stress: Not on file  Social Connections: Not on file  Intimate Partner Violence: Not on file          Allergies  Allergen Reactions   Aspirin Nausea Only            Outpatient Medications Prior to Visit  Medication Sig Dispense Refill   albuterol (VENTOLIN HFA) 108 (90 Base) MCG/ACT inhaler Inhale 2 puffs into the lungs every 4 (four) hours as needed for wheezing or shortness of breath. 18 g 3   atorvastatin (LIPITOR) 10 MG tablet Take 1 tablet (10 mg total) by mouth daily. 90 tablet 3   cyclobenzaprine (FLEXERIL) 10 MG tablet Take 1 tablet (10 mg total) by mouth 2 (two) times daily as needed for muscle spasms. 20 tablet 0   umeclidinium-vilanterol (ANORO ELLIPTA) 62.5-25 MCG/ACT AEPB Inhale 1 puff into the lungs daily at 6 (six) AM. 60 each 6    No facility-administered medications prior to visit.      Review of Systems  Constitutional:  Negative for chills, fever, malaise/fatigue and weight  loss.  HENT:  Negative for hearing loss, sore throat and tinnitus.   Eyes:  Negative for blurred vision and double vision.  Respiratory:  Negative for cough, hemoptysis, sputum production, shortness of breath, wheezing and stridor.   Cardiovascular:  Negative for chest pain, palpitations, orthopnea, leg swelling and PND.  Gastrointestinal:  Negative for abdominal pain, constipation, diarrhea, heartburn, nausea and vomiting.  Genitourinary:  Negative for dysuria, hematuria and urgency.  Musculoskeletal:  Negative for joint pain and myalgias.  Skin:  Negative for itching and rash.  Neurological:  Negative for dizziness, tingling, weakness and headaches.  Endo/Heme/Allergies:  Negative for environmental allergies. Does not bruise/bleed easily.  Psychiatric/Behavioral:  Negative for depression. The patient is not nervous/anxious and does not have insomnia.   All other systems reviewed and are negative.    Objective:   General appearance: 56 y.o., female, NAD, conversant  Eyes: anicteric sclerae, moist conjunctivae; no lid-lag; PERRLA, tracking appropriately HENT: NCAT; oropharynx, MMM, no mucosal ulcerations; normal hard and soft palate Neck: Trachea midline; FROM, supple, lymphadenopathy, no JVD Lungs: CTAB, no crackles, no wheeze, with normal respiratory effort and no intercostal retractions CV: RRR, S1, S2, no MRGs  Abdomen: Soft, non-tender; non-distended, BS present  Extremities: No peripheral edema, radial and DP pulses present bilaterally  Skin: Normal temperature, turgor and texture; no rash Psych: Appropriate affect Neuro: Alert and oriented to person and place, no focal deficit      BP 124/85    Pulse 75    Temp 98 F (36.7 C) (Oral)    Resp 17    Ht 5\' 9"  (1.753 m)    Wt 93.4 kg    LMP 09/09/2014 (Approximate)    SpO2 97%    BMI 30.42 kg/m     CBC Labs (Brief)          Component Value Date/Time    WBC 5.6 03/17/2021 0930    WBC 7.7 02/04/2018 1015    RBC 4.89 03/17/2021  0930    RBC 4.45 02/04/2018 1015    HGB 13.4 03/17/2021 0930    HCT 41.1 03/17/2021 0930    PLT 335 03/17/2021 0930    MCV 84 03/17/2021 0930  MCH 27.4 03/17/2021 0930    MCH 27.9 02/04/2018 1015    MCHC 32.6 03/17/2021 0930    MCHC 33.2 02/04/2018 1015    RDW 13.8 03/17/2021 0930    LYMPHSABS 3.0 04/16/2020 1038    MONOABS 0.5 02/04/2018 1015    EOSABS 0.4 04/16/2020 1038    BASOSABS 0.0 04/16/2020 1038          Chest Imaging: 11/06/2021: Left upper lobe spiculated 15 mm pulmonary nodule concerning for primary bronchogenic carcinoma. The patient's images have been independently reviewed by me.     Pulmonary Functions Testing Results: PFT Results Latest Ref Rng & Units 04/08/2020  FVC-Pre L 3.51  FVC-Predicted Pre % 105  FVC-Post L 3.71  FVC-Predicted Post % 111  Pre FEV1/FVC % % 62  Post FEV1/FCV % % 61  FEV1-Pre L 2.19  FEV1-Predicted Pre % 82  FEV1-Post L 2.28  DLCO uncorrected ml/min/mmHg 14.17  DLCO UNC% % 58  DLVA Predicted % 66  TLC L 6.16  TLC % Predicted % 106  RV % Predicted % 124      FeNO:    Pathology:    Echocardiogram:    Heart Catheterization:     Assessment & Plan:        ICD-10-CM    1. Lung nodule  R91.1 Pulmonary Function Test      NM PET Image Initial (PI) Skull Base To Thigh (F-18 FDG)      Ambulatory referral to Cardiothoracic Surgery     2. Stage 1 mild COPD by GOLD classification ([place])  J44.9       3. Centrilobular emphysema ([place])  J43.2       4. Nodule of upper lobe of left lung 1.5CM spiculated  R91.1           Discussion:   This is a young 56 year old female, former tobacco abuse, quit 4 years ago enrolled in lung cancer screening CTs with Dr. Joya Gaskins.  Patient had a abnormal CT image on 11/06/2021 concerning for a left upper lobe primary bronchogenic carcinoma 15 mm in size.  Plan: Here for planned single anesthetic event RAB + RAT  Once RAB complete patient will go to the operating room for inpatient procedure  requiring admission following lobectomy under the care of Dr. Kipp Brood.   Garner Nash, DO [facility] 01/08/2022 7:29 AM

## 2022-01-08 NOTE — Brief Op Note (Signed)
01/08/2022  12:09 PM  PATIENT:  Caitlin [name]  56 y.o. female  PRE-OPERATIVE DIAGNOSIS:  LUNG NODULE  POST-OPERATIVE DIAGNOSIS:  LUNG NODULE  PROCEDURE:  Procedure(s):  XI ROBOTIC ASSISTED THORASCOPY- -Left Upper Lobectomy -Intercostal nerve block -Lymph Node Dissection  SURGEON:  Surgeon(s) and Role:    * Lightfoot, Lucile Crater, MD - Primary  PHYSICIAN ASSISTANT: Ellwood Handler PA-C  ASSISTANTS: none   ANESTHESIA:   general  EBL:  Per Anesthesia Record  BLOOD ADMINISTERED:none  DRAINS:  Left chest tube    LOCAL MEDICATIONS USED:  BUPIVICAINE   SPECIMEN:  Source of Specimen:  Left Upper Lobe, Lymph nodes  DISPOSITION OF SPECIMEN:  PATHOLOGY  COUNTS:  YES  TOURNIQUET:  * No tourniquets in log *  DICTATION: .Dragon Dictation  PLAN OF CARE: Admit to inpatient   PATIENT DISPOSITION:  PACU - hemodynamically stable.   Delay start of Pharmacological VTE agent (>24hrs) due to surgical blood loss or risk of bleeding: no

## 2022-01-08 NOTE — Interval H&P Note (Signed)
History and Physical Interval Note:  01/08/2022 7:28 AM  Caitlin [name]  has presented today for surgery, with the diagnosis of LUNG NODULE.  The various methods of treatment have been discussed with the patient and family. After consideration of risks, benefits and other options for treatment, the patient has consented to  Procedure(s): XI ROBOTIC ASSISTED THORASCOPY-LEFT UPPER LOBECTOMY (Left) as a surgical intervention.  The patient's history has been reviewed, patient examined, no change in status, stable for surgery.  I have reviewed the patient's chart and labs.  Questions were answered to the patient's satisfaction.     Primitivo Merkey [name]

## 2022-01-08 NOTE — Op Note (Signed)
° °   °  [address]       [place] [address]             [phone]        01/08/2022  Patient:  Caitlin [name] STMHD Pre-Op Dx: left upper lobe pulmonary nodule   Post-op Dx:  same Procedure: - Robotic assisted left video thoracoscopy - left upper lobectomy - Mediastinal lymph node sampling - Intercostal nerve block  Surgeon and Role:      * [place] Kotter, Lucile Crater, MD - Primary  Assistant: Leretha Pol, PA-C  An experienced assistant was required given the complexity of this surgery and the standard of surgical care. The assistant was needed for exposure, dissection, suctioning, retraction of delicate tissues and sutures, instrument exchange and for overall help during this procedure.    Anesthesia  general EBL:  140ml Blood Administration: none Specimen:  left upper lobe, hilar and mediastinal lymph nodes  Drains: 28 F argyle chest tube in left chest Counts: correct   Indications: 56 yo female with LUL pulm nodule.  Concerning for primary lung cancer given her smoking history.  We discussed the risks and benefits of a combination procedure which would include a navigational bronchoscopy with biopsy and marking followed by robotic assisted left upper lobectomy.  The patient is agreeable to proceed.  She will require a stress test prior to surgery.  This has been ordered.  I will coordinate on the schedule with Dr. Valeta Harms. Findings: The nodule with in the fissure, and wedge resection would have made vascular isolation complicated.  I elected to proceed directly with a lobectomy  Operative Technique: After the risks, benefits and alternatives were thoroughly discussed, the patient was brought to the operative theatre.  Anesthesia was induced, and the patient was then placed in a right lateral decubitus position and was prepped and draped in normal sterile fashion.  An appropriate surgical pause was performed, and pre-operative antibiotics were dosed accordingly.  We  began by placing our 4 robotic ports in the the 7th intercostal space targeting the hilum of the lung.  A 51mm assistant port was placed in the 9th intercostal space in the anterior axillary line.  The robot was then docked and all instruments were passed under direct visualization.    The lung was then retracted superiorly, and the inferior pulmonary ligament was divided.  The hilum was mobilized anteriorly and posteriorly.  We identified the upper lobe vein, and after careful isolation, it was divided with a vascular stapler.  We next moved to the  fissure.  The artery was then divided with a vascular load stapler.  The bronchus to the upper was then isolated.  After a test clamp, with good ventilation of the lower lobe, the bronchus was then divided.  The fissure was completed, and the specimen was passed into an endocatch bag.  It was removed from the anterior access site.    Lymph nodes were then sampled at levels mediastinum and hilum.  The chest was irrigated, and an air leak test was performed.  An intercostal nerve block was performed under direct visualization.  A 28 F chest with then placed, and we watch the remaining lobes re-expand.  The skin and soft tissue were closed with absorbable suture    The patient tolerated the procedure without any immediate complications, and was transferred to the PACU in stable condition.  Jeray Shugart [name]

## 2022-01-08 NOTE — Op Note (Addendum)
Video Bronchoscopy with Robotic Assisted Bronchoscopic Navigation   Date of Operation: 01/08/2022   Pre-op Diagnosis: Left upper lobe lung nodule  Post-op Diagnosis: Left upper lobe lung nodule  Surgeon: Garner Nash, DO   Assistants: None   Anesthesia: General endotracheal anesthesia  Operation: Flexible video fiberoptic bronchoscopy with robotic assistance and biopsies.  Estimated Blood Loss: Minimal  Complications: None  Indications and History: Caitlin [name] is a 56 y.o. female with history of left upper lobe lung nodule. The risks, benefits, complications, treatment options and expected outcomes were discussed with the patient.  The possibilities of pneumothorax, pneumonia, reaction to medication, pulmonary aspiration, perforation of a viscus, bleeding, failure to diagnose a condition and creating a complication requiring transfusion or operation were discussed with the patient who freely signed the consent.    Description of Procedure: The patient was seen in the Preoperative Area, was examined and was deemed appropriate to proceed.  The patient was taken to [facility] endoscopy room 3, identified as Caitlin [name] and the procedure verified as Flexible Video Fiberoptic Bronchoscopy.  A Time Out was held and the above information confirmed.   Prior to the date of the procedure a high-resolution CT scan of the chest was performed. Utilizing ION software program a virtual tracheobronchial tree was generated to allow the creation of distinct navigation pathways to the patient's parenchymal abnormalities. After being taken to the operating room general anesthesia was initiated and the patient  was orally intubated. The video fiberoptic bronchoscope was introduced via the endotracheal tube and a general inspection was performed which showed normal right and left lung anatomy, aspiration of the bilateral mainstems was completed to remove any remaining secretions. Robotic catheter  inserted into patient's endotracheal tube.   Target #1 left upper lobe lung nodule: The distinct navigation pathways prepared prior to this procedure were then utilized to navigate to patient's lesion identified on CT scan. The robotic catheter was secured into place and the vision probe was withdrawn.  Lesion location was approximated using fluoroscopy, three-dimensional cone beam CT imaging and radial endobronchial ultrasound for peripheral targeting. Under fluoroscopic guidance transbronchial needle brushings, transbronchial needle biopsies, and transbronchial forceps biopsies were performed to be sent for cytology and pathology.  Following tissue sampling fiducial dye marking was completed using 50% / 50% mixture of methylene blue and ICG.  We injected approximately 1 cc of dye marking material into the lesion.  This was completed under direct fluoroscopy.  At the end of the procedure a general airway inspection was performed and there was no evidence of active bleeding. The bronchoscope was removed.  The patient tolerated the procedure well. There was no significant blood loss and there were no obvious complications. A post-procedural chest x-ray is pending.  Samples Target #1: 1. Transbronchial needle brushings from LUL 2. Transbronchial Wang needle biopsies from LUL 3. Transbronchial forceps biopsies from LUL  Plans:  Based on the findings of this bronchoscopy there is concern for malignancy within the chest.  Decision made for transfer to the operating room for admission to the hospital following robotic assisted thoracic surgery by Dr. Kipp Brood.  [facility] 01/08/2022 8:29 AM

## 2022-01-08 NOTE — Progress Notes (Signed)
Patient states that she can not take Tramadol, when she does she breaks out with a rash.

## 2022-01-08 NOTE — Plan of Care (Signed)
°  Problem: Education: Goal: Knowledge of General Education information will improve Description: Including pain rating scale, medication(s)/side effects and non-pharmacologic comfort measures Outcome: Progressing   Problem: Health Behavior/Discharge Planning: Goal: Ability to manage health-related needs will improve Outcome: Progressing   Problem: Clinical Measurements: Goal: Ability to maintain clinical measurements within normal limits will improve Outcome: Progressing Goal: Will remain free from infection Outcome: Progressing Goal: Diagnostic test results will improve Outcome: Progressing Goal: Respiratory complications will improve Outcome: Progressing Goal: Cardiovascular complication will be avoided Outcome: Progressing   Problem: Activity: Goal: Risk for activity intolerance will decrease Outcome: Progressing   Problem: Nutrition: Goal: Adequate nutrition will be maintained Outcome: Progressing   Problem: Coping: Goal: Level of anxiety will decrease Outcome: Progressing   Problem: Elimination: Goal: Will not experience complications related to bowel motility Outcome: Progressing Goal: Will not experience complications related to urinary retention Outcome: Progressing   Problem: Pain Managment: Goal: General experience of comfort will improve Outcome: Progressing   Problem: Safety: Goal: Ability to remain free from injury will improve Outcome: Progressing   Problem: Skin Integrity: Goal: Risk for impaired skin integrity will decrease Outcome: Progressing   Problem: Education: Goal: Knowledge of disease or condition will improve Outcome: Progressing Goal: Knowledge of the prescribed therapeutic regimen will improve Outcome: Progressing   Problem: Activity: Goal: Risk for activity intolerance will decrease Outcome: Progressing   Problem: Cardiac: Goal: Will achieve and/or maintain hemodynamic stability Outcome: Progressing   Problem: Clinical  Measurements: Goal: Postoperative complications will be avoided or minimized Outcome: Progressing   Problem: Respiratory: Goal: Respiratory status will improve Outcome: Progressing   Problem: Pain Management: Goal: Pain level will decrease Outcome: Progressing   Problem: Skin Integrity: Goal: Wound healing without signs and symptoms infection will improve Outcome: Progressing

## 2022-01-08 NOTE — Transfer of Care (Signed)
Immediate Anesthesia Transfer of Care Note  Patient: Caitlin [name]  Procedure(s) Performed: ROBOTIC ASSISTED NAVIGATIONAL BRONCHOSCOPY BRONCHIAL BRUSHINGS BRONCHIAL NEEDLE ASPIRATION BIOPSIES BRONCHIAL BIOPSIES VIDEO BRONCHOSCOPY WITH RADIAL ENDOBRONCHIAL ULTRASOUND FIDUCIAL DYE MARKING XI ROBOTIC ASSISTED THORASCOPY-LEFT UPPER LOBECTOMY (Left: Chest) INTERCOSTAL NERVE BLOCK (Left: Chest) NODE DISSECTION (Left: Chest)  Patient Location: PACU  Anesthesia Type:General  Level of Consciousness: awake, alert  and sedated  Airway & Oxygen Therapy: Patient Spontanous Breathing  Post-op Assessment: Post -op Vital signs reviewed and stable  Post vital signs: stable  Last Vitals:  Vitals Value Taken Time  BP    Temp    Pulse 83 01/08/22 1225  Resp 17 01/08/22 1225  SpO2 95 % 01/08/22 1225  Vitals shown include unvalidated device data.  Last Pain:  Vitals:   01/08/22 0558  TempSrc:   PainSc: 0-No pain         Complications: No notable events documented.

## 2022-01-08 NOTE — Anesthesia Postprocedure Evaluation (Signed)
Anesthesia Post Note  Patient: Caitlin [name] [place]  Procedure(s) Performed: ROBOTIC ASSISTED NAVIGATIONAL BRONCHOSCOPY BRONCHIAL BRUSHINGS BRONCHIAL NEEDLE ASPIRATION BIOPSIES BRONCHIAL BIOPSIES VIDEO BRONCHOSCOPY WITH RADIAL ENDOBRONCHIAL ULTRASOUND FIDUCIAL DYE MARKING XI ROBOTIC ASSISTED THORASCOPY-LEFT UPPER LOBECTOMY (Left: Chest) INTERCOSTAL NERVE BLOCK (Left: Chest) NODE DISSECTION (Left: Chest)     Patient location during evaluation: PACU Anesthesia Type: General Level of consciousness: awake Pain management: pain level controlled Vital Signs Assessment: post-procedure vital signs reviewed and stable Cardiovascular status: stable Postop Assessment: no apparent nausea or vomiting Anesthetic complications: no   No notable events documented.  Last Vitals:  Vitals:   01/08/22 1349 01/08/22 1400  BP: 102/67 112/70  Pulse: 88 89  Resp: 15 12  Temp: 36.4 C   SpO2: 93% 94%    Last Pain:  Vitals:   01/08/22 1400  TempSrc:   PainSc: 10-Worst pain ever                 Johntae Broxterman

## 2022-01-08 NOTE — Anesthesia Procedure Notes (Addendum)
Procedure Name: Intubation Date/Time: 01/08/2022 7:43 AM Performed by: Lavell Luster, CRNA Pre-anesthesia Checklist: Patient identified, Emergency Drugs available, Suction available and Patient being monitored Patient Re-evaluated:Patient Re-evaluated prior to induction Oxygen Delivery Method: Circle system utilized Preoxygenation: Pre-oxygenation with 100% oxygen Induction Type: IV induction Ventilation: Mask ventilation without difficulty Laryngoscope Size: Mac and 3 Grade View: Grade I Tube type: Oral Tube size: 8.5 mm Number of attempts: 1 Airway Equipment and Method: Stylet Placement Confirmation: ETT inserted through vocal cords under direct vision, positive ETCO2 and breath sounds checked- equal and bilateral Secured at: 21 cm Tube secured with: Tape Dental Injury: Teeth and Oropharynx as per pre-operative assessment  Comments: SRNA DL x1

## 2022-01-08 NOTE — Discharge Summary (Cosign Needed Addendum)
Physician Discharge Summary  Patient ID: Caitlin [name] MRN: [id number] DOB/AGE: June 23, 1966 56 y.o.  Admit date: 01/08/2022 Discharge date: 01/10/2022 Admission Diagnoses:  Pulmonary nodule left upper lobe  Stage I COPD Dyslipidemia   Discharge Diagnoses:   Pulmonary nodule left upper lobe  Stage I COPD Dyslipidemia  Discharged Condition: good  History of Present Illness:  Caitlin [name] is a 57 yo female with history of COPD, NSTEMI,  and tobacco abuse (quit 4 years ago).  The patient had a lung cancer screening CT scan 11/06/2021.  This showed a 15 mm spiculated nodule in the left upper lobe, felt to be suspicious from primary bronchogenic carcinoma.  She was referred to Dr. Joya Gaskins who obtained a PET CT scan which showed the lesion to be hypermetabolic.  It was felt she would require surgical resection and she was referred to [facility] for evaluation.  She was evaluated by Dr. Kipp Brood who felt surgical resection would be indicated.  He felt that bronchoscopy with biopsy would be performed prior to the surgery by Dr. Valeta Harms.  The risks and benefits of the procedure were explained to the patient and she was agreeable to proceed.  Hospital Course:  Caitlin [name] presented to [facility] on 01/08/2022.  She underwent Video Bronchoscopy with Robotic Assisted Bronchoscopic Navigation with Biopsies.  Pathology of the brushings was concerning for malignancy.  The patient was then taken to the operating room and underwent Robotic Assisted Left Video Thoracoscopy with Left Upper Lobectomy, Intercostal Nerve Block, and Lymph Node Dissection.  She tolerated the procedure without difficulty, was extubated, and taken to PACU in stable condition and later to [facility].  There was no significant air leak noted in the Pleur-evac.  The immediate postop chest x-ray showed good expansion of the left lung with no effusion and no pneumothorax.  The chest  tube was removed on the first postoperative day.  She was weaned from the supplemental oxygen without difficulty.  Caitlin [name] was mobilized.  Follow-up chest x-ray was obtained on postop day 2 and showed tiny left apical pneumothorax.  Incisions were noted to be healing well.  The is tolerating routine postoperative rehab.  Overall at the time of discharge the patient was felt to be quite stable.  Consults: pulmonary/intensive care  Significant Diagnostic Studies: nuclear medicine:   IMPRESSION: 1. Solid hypermetabolic nodule of the left upper lobe, concerning for primary lung malignancy. 2. Contralateral hypermetabolic subcentimeter right axillary lymph nodes, possibly reactive. Recommend attention on follow-up. 3. No evidence of metastatic disease in the abdomen or pelvis. 4. Aortic Atherosclerosis (ICD10-I70.0) and Emphysema (ICD10-J43.9).     Electronically Signed   By: Yetta Glassman M.D.   On: 12/16/2021 15:35   Treatments: surgery:   Video Bronchoscopy with Robotic Assisted Bronchoscopic Navigation    Date of Operation: 01/08/2022    Pre-op Diagnosis: Left upper lobe lung nodule   Post-op Diagnosis: Left upper lobe lung nodule   Surgeon: Garner Nash, DO    Assistants: None    Anesthesia: General endotracheal anesthesia   Operation: Flexible video fiberoptic bronchoscopy with robotic assistance and biopsies.   Estimated Blood Loss: Minimal   Complications: None   Indications and History: Caitlin [name] is a 57 y.o. female with history of left upper lobe lung nodule. The risks, benefits, complications, treatment options and expected outcomes were discussed with the patient.  The possibilities of pneumothorax, pneumonia, reaction to medication, pulmonary aspiration, perforation of  a viscus, bleeding, failure to diagnose a condition and creating a complication requiring transfusion or operation were discussed with the patient who freely signed the consent.      01/08/2022   12:09 PM   PATIENT:  Caitlin [name]  56 y.o. female   PRE-OPERATIVE DIAGNOSIS:  LUNG NODULE   POST-OPERATIVE DIAGNOSIS:  LUNG NODULE   PROCEDURE:  Procedure(s):   XI ROBOTIC ASSISTED THORASCOPY- -Left Upper Lobectomy -Intercostal nerve block -Lymph Node Dissection   SURGEON:  Surgeon(s) and Role:    * Lightfoot, Lucile Crater, MD - Primary   PHYSICIAN ASSISTANT: Ellwood Handler PA-C    PATHOLOGY: Pending  Discharge Exam: Blood pressure 118/75, pulse 82, temperature 98.5 F (36.9 C), temperature source Oral, resp. rate 15, height 5\' 9"  (1.753 m), weight 93.4 kg, last menstrual period 09/09/2014, SpO2 94 %.  General appearance: alert, cooperative, and no distress Heart: regular rate and rhythm Lungs: clear to auscultation bilaterally Abdomen: benign Extremities: no edema or calf tenderness Wound: incis healing well   Disposition: Discharge disposition: 01-Home or Self Care       Discharge Instructions     Discharge patient   Complete by: As directed    Discharge disposition: 01-Home or Self Care   Discharge patient date: 01/10/2022      Allergies as of 01/10/2022       Reactions   Aspirin Nausea Only   Tramadol Rash        Medication List     TAKE these medications    albuterol 108 (90 Base) MCG/ACT inhaler Commonly known as: VENTOLIN HFA Inhale 2 puffs into the lungs every 4 (four) hours as needed for wheezing or shortness of breath.   atorvastatin 10 MG tablet Commonly known as: LIPITOR Take 1 tablet (10 mg total) by mouth daily.   METAMUCIL FIBER PO Take 3 tablets by mouth daily.   oxyCODONE 5 MG immediate release tablet Commonly known as: Oxy IR/ROXICODONE Take 1 tablet (5 mg total) by mouth every 6 (six) hours as needed for up to 7 days for moderate pain or severe pain.   umeclidinium-vilanterol 62.5-25 MCG/ACT Aepb Commonly known as: ANORO ELLIPTA Inhale 1 puff into the lungs daily at 6 (six) AM. What changed:   when to take this reasons to take this        Follow-up Information     Lajuana Matte, MD Follow up on 01/16/2022.   Specialty: Cardiothoracic Surgery Why: Your appointment is at 1:20pm. Please arrive 30 minutes early for a chest X-ray to be performed by [facility] located on the first floor of the same building. Contact information: [place] [address] [phone]                 Signed: John Giovanni, PA-C  01/12/2022, 8:05 AM

## 2022-01-08 NOTE — Hospital Course (Addendum)
History of Present Illness:  Caitlin [name] is a 56 yo female with history of COPD, NSTEMI,  and tobacco abuse (quit 4 years ago).  The patient had a lung cancer screening CT scan 11/06/2021.  This showed a 15 mm spiculated nodule in the left upper lobe, felt to be suspicious from primary bronchogenic carcinoma.  She was referred to Dr. Joya Gaskins who obtained a PET CT scan which showed the lesion to be hypermetabolic.  It was felt she would require surgical resection and she was referred to [facility] for evaluation.  She was evaluated by Dr. Kipp Brood who felt surgical resection would be indicated.  He felt that bronchoscopy with biopsy would be performed prior to the surgery by Dr. Valeta Harms.  The risks and benefits of the procedure were explained to the patient and she was agreeable to proceed.  Hospital Course:  [name] presented to [facility] on 01/08/2022.  She underwent Video Bronchoscopy with Robotic Assisted Bronchoscopic Navigation with Biopsies.  Pathology of the brushings was concerning for malignancy.  The patient was then taken to the operating room and underwent Robotic Assisted Left Video Thoracoscopy with Left Upper Lobectomy, Intercostal Nerve Block, and Lymph Node Dissection.  She tolerated the procedure without difficulty, was extubated, and taken to PACU in stable condition.

## 2022-01-09 ENCOUNTER — Inpatient Hospital Stay (HOSPITAL_COMMUNITY): Payer: Commercial Managed Care - HMO

## 2022-01-09 ENCOUNTER — Encounter (HOSPITAL_COMMUNITY): Payer: Self-pay | Admitting: Thoracic Surgery (Cardiothoracic Vascular Surgery)

## 2022-01-09 LAB — BASIC METABOLIC PANEL
Anion gap: 7 (ref 5–15)
BUN: 11 mg/dL (ref 6–20)
CO2: 21 mmol/L — ABNORMAL LOW (ref 22–32)
Calcium: 8.8 mg/dL — ABNORMAL LOW (ref 8.9–10.3)
Chloride: 108 mmol/L (ref 98–111)
Creatinine, Ser: 0.89 mg/dL (ref 0.44–1.00)
GFR, Estimated: >60 mL/min (ref >60)
Glucose, Bld: 116 mg/dL — ABNORMAL HIGH (ref 70–99)
Potassium: 4.1 mmol/L (ref 3.5–5.1)
Sodium: 136 mmol/L (ref 135–145)

## 2022-01-09 LAB — CBC
HCT: 36.3 % (ref 36.0–46.0)
Hemoglobin: 12 g/dL (ref 12.0–15.0)
MCH: 28.2 pg (ref 26.0–34.0)
MCHC: 33.1 g/dL (ref 30.0–36.0)
MCV: 85.2 fL (ref 80.0–100.0)
Platelets: 294 10*3/uL (ref 150–400)
RBC: 4.26 MIL/uL (ref 3.87–5.11)
RDW: 14 % (ref 11.5–15.5)
WBC: 13.2 10*3/uL — ABNORMAL HIGH (ref 4.0–10.5)
nRBC: 0 % (ref 0.0–0.2)

## 2022-01-09 NOTE — Discharge Instructions (Signed)

## 2022-01-09 NOTE — Progress Notes (Signed)
°   01/09/22 1641  Mobility  Activity Refused mobility (Patient declined for unspecified reasons)

## 2022-01-09 NOTE — Progress Notes (Addendum)
° °   °  [address]       [place] [address]             [phone]      1 Day Post-Op Procedure(s) (LRB): XI ROBOTIC ASSISTED THORASCOPY-LEFT UPPER LOBECTOMY (Left) INTERCOSTAL NERVE BLOCK (Left) NODE DISSECTION (Left) Subjective: Sitting up trying to eat breakfast but having some nausea.  Having some incisional pain but is getting relief with the analgesics ordered.   Currently on RA.   Objective: Vital signs in last 24 hours: Temp:  [97.6 F (36.4 C)-98.6 F (37 C)] 98.6 F (37 C) (02/24 0319) Pulse Rate:  [72-100] 83 (02/24 0319) Cardiac Rhythm: Normal sinus rhythm (02/23 2020) Resp:  [12-18] 16 (02/24 0319) BP: (102-127)/(67-76) 115/69 (02/24 0319) SpO2:  [91 %-98 %] 96 % (02/24 0319) Arterial Line BP: (79-92)/(69-80) 79/69 (02/23 1315)   Intake/Output from previous day: 02/23 0701 - 02/24 0700 In: 2520 [P.O.:720; I.V.:1500; IV Piggyback:300] Out: 1365 [Urine:1195; Blood:100; Chest Tube:70] Intake/Output this shift: No intake/output data recorded.  General appearance: alert, cooperative, and no distress Neurologic: intact Heart: regular rate and rhythm Lungs: breath sounds mildly diminished on the left but otherwise clear bilat. No air leak. Minimal CT drainage since surgery. The tube is to water seal. Wound: The port incision are intact and dry.   Lab Results: Recent Labs    01/06/22 1301 01/09/22 0126  WBC 6.8 13.2*  HGB 13.2 12.0  HCT 39.3 36.3  PLT 312 294   BMET:  Recent Labs    01/06/22 1301 01/09/22 0126  NA 138 136  K 3.8 4.1  CL 108 108  CO2 21* 21*  GLUCOSE 91 116*  BUN 10 11  CREATININE 0.72 0.89  CALCIUM 9.0 8.8*    PT/INR:  Recent Labs    01/06/22 1301  LABPROT 13.7  INR 1.1   ABG    Component Value Date/Time   PHART 7.42 01/06/2022 1335   HCO3 24.0 01/06/2022 1335   TCO2 27 09/13/2017 1117   ACIDBASEDEF 0.2 01/06/2022 1335   O2SAT 98.9 01/06/2022 1335   CBG (last 3)  No results for input(s):  GLUCAP in the last 72 hours.  Assessment/Plan: S/P Procedure(s) (LRB): XI ROBOTIC ASSISTED THORASCOPY-LEFT UPPER LOBECTOMY (Left) INTERCOSTAL NERVE BLOCK (Left) NODE DISSECTION (Left)  -POD1 left upper lobectomy. Pain reasonably controlled. CXR with minimal if any left apical space (report pending). No air leak and minimal drainage. Leave the CT to water seal for now. Mobilize. Path pending.   -Stage I COPD- Oxygenating well on RA. Encouraging pulmonary hygiene.  She has stopped smoking.   -DVT PPX- enoxaparin [place] to start today. Ambulate.    LOS: 1 day    Antony Odea, [place] [phone] 01/09/2022  Agree with above Doing well Will remove CT today after ambulation  Jujhar Everett O Aimar [place]

## 2022-01-09 NOTE — Progress Notes (Signed)
Ambulated along the hallway  for 5 min with Rn, tolerated well.

## 2022-01-09 NOTE — Progress Notes (Signed)
Patient called writer into room with wanting something for pain. Rates her pain a 5/10, and complains of one area in particular. Area is left side immediately below breast, directly above port incision.   Port incision clean, dry, intact, approximated, no drainage or redness. Upon palpation, there is a small area of hardness and swelling at the site of her pain, again superior portion of port incision.  Pain medications given. Continue to monitor.

## 2022-01-10 ENCOUNTER — Inpatient Hospital Stay (HOSPITAL_COMMUNITY): Payer: Commercial Managed Care - HMO

## 2022-01-10 LAB — TYPE AND SCREEN
ABO/RH(D): A POS
Antibody Screen: POSITIVE
Donor AG Type: NEGATIVE
Donor AG Type: NEGATIVE
PT AG Type: NEGATIVE
Unit division: 0
Unit division: 0

## 2022-01-10 LAB — BPAM RBC
Blood Product Expiration Date: 202303042359
Blood Product Expiration Date: 202303042359
Unit Type and Rh: 6200
Unit Type and Rh: 6200

## 2022-01-10 MED ORDER — OXYCODONE HCL 5 MG PO TABS: 5.0000 mg | ORAL_TABLET | Freq: Four times a day (QID) | ORAL | 0 refills | Status: AC | PRN

## 2022-01-10 NOTE — Plan of Care (Signed)
°  Problem: Education: Goal: Knowledge of General Education information will improve Description: Including pain rating scale, medication(s)/side effects and non-pharmacologic comfort measures Outcome: Progressing   Problem: Health Behavior/Discharge Planning: Goal: Ability to manage health-related needs will improve Outcome: Progressing   Problem: Clinical Measurements: Goal: Will remain free from infection Outcome: Progressing Goal: Diagnostic test results will improve Outcome: Progressing   Problem: Pain Managment: Goal: General experience of comfort will improve Outcome: Progressing   Problem: Skin Integrity: Goal: Risk for impaired skin integrity will decrease Outcome: Progressing

## 2022-01-10 NOTE — Progress Notes (Signed)
[address]       [place] [address]             [phone]      2 Days Post-Op Procedure(s) (LRB): XI ROBOTIC ASSISTED THORASCOPY-LEFT UPPER LOBECTOMY (Left) INTERCOSTAL NERVE BLOCK (Left) NODE DISSECTION (Left) Subjective: Looks and feels well, minor discomforts  Objective: Vital signs in last 24 hours: Temp:  [98 F (36.7 C)-98.9 F (37.2 C)] 98.5 F (36.9 C) (02/25 0358) Pulse Rate:  [75-100] 82 (02/25 0358) Cardiac Rhythm: Normal sinus rhythm (02/24 1934) Resp:  [15-20] 15 (02/25 0358) BP: (117-130)/(65-80) 118/75 (02/25 0358) SpO2:  [93 %-96 %] 93 % (02/25 0358)  Hemodynamic parameters for last 24 hours:    Intake/Output from previous day: 02/24 0701 - 02/25 0700 In: 360 [P.O.:360] Out: -  Intake/Output this shift: No intake/output data recorded.  General appearance: alert, cooperative, and no distress Heart: regular rate and rhythm Lungs: clear to auscultation bilaterally Abdomen: benign Extremities: no edema or calf tenderness Wound: incis healing well  Lab Results: Recent Labs    01/09/22 0126  WBC 13.2*  HGB 12.0  HCT 36.3  PLT 294   BMET:  Recent Labs    01/09/22 0126  NA 136  K 4.1  CL 108  CO2 21*  GLUCOSE 116*  BUN 11  CREATININE 0.89  CALCIUM 8.8*    PT/INR: No results for input(s): LABPROT, INR in the last 72 hours. ABG    Component Value Date/Time   PHART 7.42 01/06/2022 1335   HCO3 24.0 01/06/2022 1335   TCO2 27 09/13/2017 1117   ACIDBASEDEF 0.2 01/06/2022 1335   O2SAT 98.9 01/06/2022 1335   CBG (last 3)  No results for input(s): GLUCAP in the last 72 hours.  Meds Scheduled Meds:  acetaminophen  1,000 mg Oral Q6H   Or   acetaminophen (TYLENOL) oral liquid 160 mg/5 mL  1,000 mg Oral Q6H   atorvastatin  10 mg Oral Daily   bisacodyl  10 mg Oral Daily   enoxaparin (LOVENOX) injection  40 mg Subcutaneous QHS   ketorolac  15 mg Intravenous Q6H   senna-docusate  1 tablet Oral QHS    umeclidinium-vilanterol  1 puff Inhalation Daily   Continuous Infusions: PRN Meds:.albuterol, morphine injection, ondansetron (ZOFRAN) IV, oxyCODONE  Xrays DG Chest Port 1 View  Result Date: 01/09/2022 CLINICAL DATA:  Chest tube present. Status post lobectomy of the lung with sore chest EXAM: PORTABLE CHEST 1 VIEW COMPARISON:  Yesterday FINDINGS: Left chest tube with tip at the apex. No visible pneumothorax or other complicating feature. Lung volumes are better inflated. Stable heart size and mediastinal contours. IMPRESSION: Improved inflation.  No visible pneumothorax. Electronically Signed   By: Jorje Guild M.D.   On: 01/09/2022 08:18   DG Chest Port 1 View  Result Date: 01/08/2022 CLINICAL DATA:  Status post resection of left upper lobe nodule EXAM: PORTABLE CHEST 1 VIEW COMPARISON:  Previous studies including the examination of 01/06/2022 FINDINGS: Nodular density in the left parahilar region seen in the previous study is not evident suggesting interval removal. Left chest tube is noted with its tip in the left apex. Small patchy infiltrates are seen in the right upper and right lower lung fields. There is poor inspiration. There is no significant pleural effusion or pneumothorax. IMPRESSION: There is interval surgical resection nodule in the left parahilar region. Left chest tube is noted. Small patchy infiltrates are seen in the right upper and right lower  lung fields suggesting atelectasis/pneumonia. Electronically Signed   By: Elmer Picker M.D.   On: 01/08/2022 13:27   DG C-ARM BRONCHOSCOPY  Result Date: 01/08/2022 C-ARM BRONCHOSCOPY: Fluoroscopy was utilized by the requesting physician.  No radiographic interpretation.    Assessment/Plan: S/P Procedure(s) (LRB): XI ROBOTIC ASSISTED THORASCOPY-LEFT UPPER LOBECTOMY (Left) INTERCOSTAL NERVE BLOCK (Left) NODE DISSECTION (Left)  POD#2  1 afeb. VSS , sinus rhythm 2 sats good on RA 3 voiding well 4 CXR appears stable. ?  Small apical pntx 5 no new labs 6 doing well, stable for d/c   LOS: 2 days    Gaspar Bidding Pager [phone]  01/10/2022

## 2022-01-11 ENCOUNTER — Encounter (HOSPITAL_COMMUNITY): Payer: Self-pay | Admitting: Pulmonary Disease

## 2022-01-12 ENCOUNTER — Telehealth: Payer: Self-pay

## 2022-01-12 NOTE — Telephone Encounter (Signed)
Pt returned [place] call, informed about her upcoming appt,  please advise.

## 2022-01-12 NOTE — Telephone Encounter (Signed)
Transition Care Management Unsuccessful Follow-up Telephone Call  Date of discharge and from where:  01/10/2022, [facility]   Attempts:  1st Attempt  Reason for unsuccessful TCM follow-up call:  Left voice message on # [phone].  Patient has appointment with Dr Redmond Pulling @ [facility] - 01/22/2022.

## 2022-01-13 ENCOUNTER — Telehealth: Payer: Self-pay

## 2022-01-13 NOTE — Telephone Encounter (Signed)
Transition Care Management Follow-up Telephone Call Date of discharge and from where: 01/10/2022, [facility]  How have you been since you were released from the hospital? She stated that she is doing pretty good.  Any questions or concerns? Yes-  she is constipated and has been up ambulating, increased her fluid intake and has taken stool softener. She will contact the surgeon to inquire if something can be prescribed for her constipation.   Items Reviewed: Did the pt receive and understand the discharge instructions provided? Yes  Medications obtained and verified? Yes  - she said she has all of her medications and did not have any questions about her med regime.  Other? No  Any new allergies since your discharge? No  Dietary orders reviewed? Yes Do you have support at home? Yes  - she is staying with her sister.   Home Care and Equipment/Supplies: Were home health services ordered? no If so, what is the name of the agency? N/a  Has the agency set up a time to come to the patient's home? not applicable Were any new equipment or medical supplies ordered?  No What is the name of the medical supply agency? N/a Were you able to get the supplies/equipment? not applicable Do you have any questions related to the use of the equipment or supplies? No  Functional Questionnaire: (I = Independent and D = Dependent) ADLs: independent  Follow up appointments reviewed:  [facility] f/u appt confirmed? Yes  Scheduled to see Dr Redmond Pulling - 03/24/2022.  She did not want to reschedule to be seen sooner.  [facility] f/u appt confirmed? Yes  Scheduled to see surgeon - 01/16/2022.  Are transportation arrangements needed? No  If their condition worsens, is the pt aware to call PCP or go to the Emergency Dept.? Yes Was the patient provided with contact information for the PCP's office or ED? Yes Was to pt encouraged to call back with questions or concerns? Yes

## 2022-01-14 LAB — SURGICAL PATHOLOGY

## 2022-01-14 LAB — CYTOLOGY - NON PAP

## 2022-01-16 ENCOUNTER — Other Ambulatory Visit: Payer: Self-pay

## 2022-01-16 ENCOUNTER — Ambulatory Visit (INDEPENDENT_AMBULATORY_CARE_PROVIDER_SITE_OTHER): Payer: Self-pay | Admitting: Thoracic Surgery (Cardiothoracic Vascular Surgery)

## 2022-01-16 VITALS — BP 127/87 | HR 97 | Resp 20 | Ht 69.0 in | Wt 201.0 lb

## 2022-01-16 DIAGNOSIS — R911 Solitary pulmonary nodule: Secondary | ICD-10-CM

## 2022-01-16 DIAGNOSIS — Z09 Encounter for follow-up examination after completed treatment for conditions other than malignant neoplasm: Secondary | ICD-10-CM

## 2022-01-22 NOTE — Progress Notes (Signed)
? ?   ?  [address] ?      [place] [address] ?            [phone]       ? ?[place] Record #[id number] ?Date of Birth: Aug 15, 1966 ? ?Referring: Elsie Stain, MD ?Primary Care: Nicolette Bang, MD ?Primary Cardiologist:None ? ?Reason for visit:   follow-up ? ?History of Present Illness:     ?56 year old female presents for her 1 week follow-up appointment.  She is status post robotic assisted left upper lobectomy.  Overall she is doing well.  She denies any significant pain.  She is slightly short of breath but is not concerned with this. ? ?Physical Exam: ?BP 127/87 (BP Location: Right Arm, Patient Position: Sitting)   Pulse 97   Resp 20   Ht 5\' 9"  (1.753 m)   Wt 201 lb (91.2 kg)   LMP 09/09/2014 (Approximate)   SpO2 95%   BMI 29.68 kg/m?  ? ?Alert NAD ?Incision clean.   ?Abdomen, ND ?No peripheral edema ? ? ?Diagnostic Studies & Laboratory data: ? 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 ?  ? ?Assessment / Plan:   ?56 year old female status post left upper lobectomy for T1BN0 M0 stage I adenocarcinoma.  She will follow-up in 1 month with a chest x-ray. ? ? ?Lucile Crater Christien Frankl ?01/22/2022 2:43 PM ? ? ? ? ? ?' ?

## 2022-01-28 ENCOUNTER — Other Ambulatory Visit: Payer: Self-pay | Admitting: *Deleted

## 2022-01-28 NOTE — Progress Notes (Signed)
The proposed treatment discussed in cancer conference 3/9 is for discussion purpose only and is not a binding recommendation.  The patient was not physically examined nor present for their treatment options. Therefore, final treatment plans cannot be decided.  ?

## 2022-02-09 ENCOUNTER — Other Ambulatory Visit: Payer: Self-pay | Admitting: *Deleted

## 2022-02-09 DIAGNOSIS — Z09 Encounter for follow-up examination after completed treatment for conditions other than malignant neoplasm: Secondary | ICD-10-CM

## 2022-02-09 NOTE — Progress Notes (Signed)
Patient contacted the office requesting to go back to work. Patient is s/p robotic assisted lobectomy 2/23 by Dr. Kipp Brood. Patient states she is still experiencing soreness in her chest tube site. Patient's 1 month f/u appt changed to this coming Friday to discuss going back to work and soreness with MD. Patient verbalizes understanding.  ?

## 2022-02-13 ENCOUNTER — Ambulatory Visit
Admission: RE | Admit: 2022-02-13 | Discharge: 2022-02-13 | Disposition: A | Discharge status: Home / Self Care | Payer: Managed Care, Other (non HMO) | Source: Ambulatory Visit | Attending: Thoracic Surgery (Cardiothoracic Vascular Surgery) | Admitting: Thoracic Surgery (Cardiothoracic Vascular Surgery)

## 2022-02-13 ENCOUNTER — Encounter: Payer: Self-pay | Admitting: Thoracic Surgery (Cardiothoracic Vascular Surgery)

## 2022-02-13 ENCOUNTER — Ambulatory Visit (INDEPENDENT_AMBULATORY_CARE_PROVIDER_SITE_OTHER): Payer: Self-pay | Admitting: Thoracic Surgery (Cardiothoracic Vascular Surgery)

## 2022-02-13 VITALS — BP 135/85 | HR 69 | Resp 20 | Ht 69.0 in | Wt 204.0 lb

## 2022-02-13 DIAGNOSIS — Z09 Encounter for follow-up examination after completed treatment for conditions other than malignant neoplasm: Secondary | ICD-10-CM

## 2022-02-13 DIAGNOSIS — C3412 Malignant neoplasm of upper lobe, left bronchus or lung: Secondary | ICD-10-CM

## 2022-02-13 NOTE — Progress Notes (Signed)
? ?   ?  [address] ?      [place] [address] ?            [phone]       ? ?[place] Record #[id number] ?Date of Birth: Mar 23, 1966 ? ?Referring: Elsie Stain, MD ?Primary Care: Nicolette Bang, MD ?Primary Cardiologist:None ? ?Reason for visit:   follow-up ? ?History of Present Illness:     ?56 year old female comes in for.  Overall she is doing well.  She only complains of some muscular skeletal pain at the access incision.  Otherwise she is doing well and is ready return to work. ? ?Physical Exam: ?BP 135/85   Pulse 69   Resp 20   Ht 5\' 9"  (1.753 m)   Wt 204 lb (92.5 kg)   LMP 09/09/2014 (Approximate)   SpO2 98% Comment: RA  BMI 30.13 kg/m?  ? ?Alert NAD ?Incision clean, mild point tenderness at the anterior incision.   ?Abdomen, ND ?No peripheral edema ? ? ?Diagnostic Studies & Laboratory data: ?CXR: Clear ? ?  ? ?Assessment / Plan:   ?56 year old female status post left upper lobectomy for a T1b N0 M0 stage I adenocarcinoma.  Overall she is doing well.  She was referred to our lung cancer center for ongoing surveillance.  She will follow-up with me as needed. ? ? ?Lucile Crater Torie Priebe ?02/13/2022 3:29 PM ? ? ? ? ? ? ?

## 2022-02-20 ENCOUNTER — Ambulatory Visit: Payer: Managed Care, Other (non HMO) | Admitting: Thoracic Surgery (Cardiothoracic Vascular Surgery)

## 2022-03-02 ENCOUNTER — Telehealth: Payer: Self-pay | Admitting: Internal Medicine

## 2022-03-02 ENCOUNTER — Encounter: Payer: Self-pay | Admitting: *Deleted

## 2022-03-02 DIAGNOSIS — R911 Solitary pulmonary nodule: Secondary | ICD-10-CM

## 2022-03-02 NOTE — Progress Notes (Signed)
Oncology Nurse Navigator Documentation ? ? ?  03/02/2022  ?  9:00 AM  ?Oncology Nurse Navigator Flowsheets  ?Navigator Follow Up Date: 03/05/2022  ?Navigator Follow Up Reason: New Patient Appointment  ?Navigator Location [facility]  ?Referral Date to RadOnc/MedOnc 03/02/2022  ?Navigator Encounter Type Other:  ?Barriers/Navigation Needs Coordination of Care/I received referral on Caitlin [name] today. I updated new patient coordinator to call and schedule patient to be seen on 4/20 with labs.   ?Interventions Coordination of Care  ?Acuity Level 2-Minimal Needs (1-2 Barriers Identified)  ?Coordination of Care Other  ?Time Spent with Patient 30  ?  ?

## 2022-03-02 NOTE — Telephone Encounter (Signed)
Scheduled appt per 4/14 referral. Pt is aware of appt date and time. Pt is aware to arrive 15 mins prior to appt time and to bring and updated insurance card. Pt is aware of appt location.   ?

## 2022-03-05 ENCOUNTER — Encounter: Payer: Self-pay | Admitting: *Deleted

## 2022-03-05 ENCOUNTER — Other Ambulatory Visit: Payer: Self-pay

## 2022-03-05 ENCOUNTER — Inpatient Hospital Stay: Payer: Commercial Managed Care - HMO | Attending: Internal Medicine | Admitting: Internal Medicine

## 2022-03-05 ENCOUNTER — Encounter: Payer: Self-pay | Admitting: Internal Medicine

## 2022-03-05 ENCOUNTER — Inpatient Hospital Stay: Payer: Commercial Managed Care - HMO

## 2022-03-05 VITALS — BP 132/79 | HR 66 | Temp 98.0°F | Resp 18 | Ht 69.0 in | Wt 203.7 lb

## 2022-03-05 DIAGNOSIS — C3490 Malignant neoplasm of unspecified part of unspecified bronchus or lung: Secondary | ICD-10-CM | POA: Diagnosis not present

## 2022-03-05 DIAGNOSIS — C3412 Malignant neoplasm of upper lobe, left bronchus or lung: Secondary | ICD-10-CM | POA: Insufficient documentation

## 2022-03-05 DIAGNOSIS — I509 Heart failure, unspecified: Secondary | ICD-10-CM | POA: Diagnosis not present

## 2022-03-05 DIAGNOSIS — J449 Chronic obstructive pulmonary disease, unspecified: Secondary | ICD-10-CM | POA: Diagnosis not present

## 2022-03-05 DIAGNOSIS — Z87891 Personal history of nicotine dependence: Secondary | ICD-10-CM

## 2022-03-05 DIAGNOSIS — I429 Cardiomyopathy, unspecified: Secondary | ICD-10-CM | POA: Diagnosis not present

## 2022-03-05 DIAGNOSIS — R911 Solitary pulmonary nodule: Secondary | ICD-10-CM

## 2022-03-05 LAB — CMP (CANCER CENTER ONLY)
ALT: 13 U/L (ref 0–44)
AST: 14 U/L — ABNORMAL LOW (ref 15–41)
Albumin: 4 g/dL (ref 3.5–5.0)
Alkaline Phosphatase: 55 U/L (ref 38–126)
Anion gap: 6 (ref 5–15)
BUN: 8 mg/dL (ref 6–20)
CO2: 24 mmol/L (ref 22–32)
Calcium: 9.3 mg/dL (ref 8.9–10.3)
Chloride: 111 mmol/L (ref 98–111)
Creatinine: 0.72 mg/dL (ref 0.44–1.00)
GFR, Estimated: >60 mL/min (ref >60)
Glucose, Bld: 86 mg/dL (ref 70–99)
Potassium: 3.7 mmol/L (ref 3.5–5.1)
Sodium: 141 mmol/L (ref 135–145)
Total Bilirubin: 0.3 mg/dL (ref 0.3–1.2)
Total Protein: 7 g/dL (ref 6.5–8.1)

## 2022-03-05 LAB — CBC WITH DIFFERENTIAL (CANCER CENTER ONLY)
Abs Immature Granulocytes: 0.01 10*3/uL (ref 0.00–0.07)
Basophils Absolute: 0.1 10*3/uL (ref 0.0–0.1)
Basophils Relative: 1 %
Eosinophils Absolute: 0.3 10*3/uL (ref 0.0–0.5)
Eosinophils Relative: 3 %
HCT: 38.7 % (ref 36.0–46.0)
Hemoglobin: 12.8 g/dL (ref 12.0–15.0)
Immature Granulocytes: 0 %
Lymphocytes Relative: 58 %
Lymphs Abs: 4.4 10*3/uL — ABNORMAL HIGH (ref 0.7–4.0)
MCH: 27.9 pg (ref 26.0–34.0)
MCHC: 33.1 g/dL (ref 30.0–36.0)
MCV: 84.3 fL (ref 80.0–100.0)
Monocytes Absolute: 0.7 10*3/uL (ref 0.1–1.0)
Monocytes Relative: 9 %
Neutro Abs: 2.2 10*3/uL (ref 1.7–7.7)
Neutrophils Relative %: 29 %
Platelet Count: 305 10*3/uL (ref 150–400)
RBC: 4.59 MIL/uL (ref 3.87–5.11)
RDW: 13.6 % (ref 11.5–15.5)
WBC Count: 7.6 10*3/uL (ref 4.0–10.5)
nRBC: 0 % (ref 0.0–0.2)

## 2022-03-05 NOTE — Progress Notes (Signed)
? ? [place] ?[phone]   [phone] ? ?CONSULT NOTE ? ?REFERRING PHYSICIAN: Dr. Melodie Bouillon ? ?REASON FOR CONSULTATION:  ?56 years old African-American female recently diagnosed with lung cancer. ? ?HPI ?Caitlin [name] is a 56 y.o. female with past medical history significant for congestive heart failure, COPD, dyslipidemia as well as history of cardiomyopathy.  The patient was seen by Dr. Asencion Noble at the [facility] for routine evaluation and because of her long history of smoking he ordered a CT screening of the chest which was performed on November 04, 2021 and that showed 1.51 cm spiculated nodule identified in the posterior left upper lobe but no other suspicious nodule or mass.  This was followed by a PET scan on December 16, 2021 and it showed a solid pulmonary nodule of the left upper lobe with SUV max of 18.4 and measuring approximately 1.5 x 1.0 cm.  There was hypermetabolic subcentimeter right axillary lymph node with index lymph node measuring 0.8 cm with SUV max of 8.1.  The patient mentions that she had COVID-19 booster shot in the right arm several weeks before.  The patient was referred to Dr. Valeta Harms and Dr. Kipp Brood.  She underwent video bronchoscopy with robotic assisted bronchoscopic navigation and biopsy of the left upper lobe lung nodule under the care of Dr. Valeta Harms followed by robotic assisted left video thoracoscopy with left upper lobectomy and mediastinal lymph node sampling under the care of Dr. Kipp Brood on January 08, 2022.  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  There was no evidence for visceral pleural or lymphovascular invasion. ?The patient is recovering well from her surgery except for some soreness  on the left side.  She has no shortness of breath, cough or hemoptysis.  She has no nausea, vomiting, diarrhea but has constipation and she is using stool softener.  She has no weight loss or night sweats.  She has no headache or visual changes. ?Family history significant for mother with heart disease and pancreatic cancer.  Father had brain aneurysm. ?The patient is divorced and has 2 sons.  She works as a [employer]arry manager.  She has a history of smoking 1 pack/day for around 30 years and quit 5 years ago.  She drinks alcohol occasionally and no history of drug abuse.  ? ?HPI ? ?Past Medical History:  ?Diagnosis Date  ? CHF (congestive heart failure) ([place])   ? COPD (chronic obstructive pulmonary disease) ([place])   ? HLD (hyperlipidemia)   ? tx w/atorvastatin  ? NSTEMI (non-ST elevated myocardial infarction) ([place])   ? pt denies  ? Takotsubo cardiomyopathy   ? ? ?Past Surgical History:  ?Procedure Laterality Date  ? BRONCHIAL BIOPSY  01/08/2022  ? Procedure: BRONCHIAL BIOPSIES;  Surgeon: Garner Nash, DO;  Location: [facility];  Service: Pulmonary;;  ? BRONCHIAL BRUSHINGS  01/08/2022  ? Procedure: BRONCHIAL BRUSHINGS;  Surgeon: Garner Nash, DO;  Location: [place];  Service: Pulmonary;;  ? BRONCHIAL NEEDLE ASPIRATION BIOPSY  01/08/2022  ? Procedure: BRONCHIAL NEEDLE ASPIRATION BIOPSIES;  Surgeon: Garner Nash, DO;  Location: [facility];  Service: Pulmonary;;  ? FIDUCIAL MARKER PLACEMENT  01/08/2022  ? Procedure: FIDUCIAL DYE MARKING;  Surgeon: Garner Nash, DO;  Location: [facility];  Service: Pulmonary;;  ? INTERCOSTAL NERVE  BLOCK Left 01/08/2022  ? Procedure: INTERCOSTAL NERVE BLOCK;  Surgeon: Lajuana Matte, MD;  Location: [place];  Service: Thoracic;  Laterality: Left;  ? LEFT HEART CATH AND CORONARY ANGIOGRAPHY N/A 07/15/2017  ? Procedure: LEFT HEART CATH AND CORONARY ANGIOGRAPHY;  Surgeon: Martinique, Peter M, MD;  Location: [facility];  Service: Cardiovascular;  Laterality: N/A;  ? NODE  DISSECTION Left 01/08/2022  ? Procedure: NODE DISSECTION;  Surgeon: Lajuana Matte, MD;  Location: [facility] [place];  Service: Thoracic;  Laterality: Left;  ? TRACHEOSTOMY CLOSURE  2018  ? VIDEO BRONCHOSCOPY WITH RADIAL ENDOBRONCHIAL ULTRASOUND  01/08/2022  ? Procedure: VIDEO BRONCHOSCOPY WITH RADIAL ENDOBRONCHIAL ULTRASOUND;  Surgeon: Garner Nash, DO;  Location: [facility];  Service: Pulmonary;;  ? WISDOM TOOTH EXTRACTION    ? ? ?Family History  ?Problem Relation Age of Onset  ? Heart disease Mother   ?     age 88's  ? Hypertension Mother   ? Pancreatic cancer Mother   ? Aneurysm Father   ?     brain  ? Colon cancer Neg Hx   ? Colon polyps Neg Hx   ? Esophageal cancer Neg Hx   ? Rectal cancer Neg Hx   ? Stomach cancer Neg Hx   ? ? ?Social History ?Social History  ? ?Tobacco Use  ? Smoking status: Former  ?  Packs/day: 1.00  ?  Types: Cigarettes  ?  Quit date: 08/16/2017  ?  Years since quitting: 4.5  ? Smokeless tobacco: Never  ? Tobacco comments:  ?  quit 07/12/17  ?Vaping Use  ? Vaping Use: Never used  ?Substance Use Topics  ? Alcohol use: Yes  ?  Comment: occasionally  ? Drug use: Not Currently  ?  Comment: Occasional marijuana quit 6 months ago   ? ? ?Allergies  ?Allergen Reactions  ? Aspirin Nausea Only  ? Tramadol Rash  ? ? ?Current Outpatient Medications  ?Medication Sig Dispense Refill  ? atorvastatin (LIPITOR) 10 MG tablet Take 1 tablet (10 mg total) by mouth daily. 90 tablet 3  ? METAMUCIL FIBER PO Take 3 tablets by mouth daily.    ? albuterol (VENTOLIN HFA) 108 (90 Base) MCG/ACT inhaler Inhale 2 puffs into the lungs every 4 (four) hours as needed for wheezing or shortness of breath. (Patient not taking: Reported on 03/05/2022) 18 g 3  ? umeclidinium-vilanterol (ANORO ELLIPTA) 62.5-25 MCG/ACT AEPB Inhale 1 puff into the lungs daily at 6 (six) AM. (Patient not taking: Reported on 03/05/2022) 60 each 6  ? ?No current facility-administered medications for this visit.  ? ? ?Review of Systems ? ?Constitutional:  negative ?Eyes: negative ?Ears, nose, mouth, throat, and face: negative ?Respiratory: positive for pleurisy/chest pain ?Cardiovascular: negative ?Gastrointestinal: negative ?Genitourinary:negative ?Integument/breast: negative ?Hematologic/lymphatic: negative ?Musculoskeletal:negative ?Neurological: negative ?Behavioral/Psych: negative ?Endocrine: negative ?Allergic/Immunologic: negative ? ?Physical Exam ? ?JJK:KXFGH, healthy, no distress, well nourished, and well developed ?SKIN: skin color, texture, turgor are normal, no rashes or significant lesions ?HEAD: Normocephalic, No masses, lesions, tenderness or abnormalities ?EYES: normal, PERRLA, Conjunctiva are pink and non-injected ?EARS: External ears normal, Canals clear ?OROPHARYNX:no exudate, no erythema, and lips, buccal mucosa, and tongue normal  ?NECK: supple, no adenopathy, no JVD ?LYMPH:  no palpable lymphadenopathy, no hepatosplenomegaly ?BREAST:not examined ?LUNGS: clear to auscultation , and palpation ?HEART: regular rate & rhythm, no murmurs, and no gallops ?ABDOMEN:abdomen soft, non-tender, normal bowel sounds, and no masses or organomegaly ?BACK: Back symmetric, no curvature., No CVA tenderness ?EXTREMITIES:no joint deformities, effusion,  or inflammation, no edema  ?NEURO: alert & oriented x 3 with fluent speech, no focal motor/sensory deficits ? ?PERFORMANCE STATUS: ECOG 1 ? ?LABORATORY DATA: ?Lab Results  ?Component Value Date  ? WBC 7.6 03/05/2022  ? HGB 12.8 03/05/2022  ? HCT 38.7 03/05/2022  ? MCV 84.3 03/05/2022  ? PLT 305 03/05/2022  ? ? ?  Chemistry   ?   ?Component Value Date/Time  ? NA 141 03/05/2022 1343  ? NA 141 03/17/2021 0930  ? K 3.7 03/05/2022 1343  ? CL 111 03/05/2022 1343  ? CO2 24 03/05/2022 1343  ? BUN 8 03/05/2022 1343  ? BUN 9 03/17/2021 0930  ? CREATININE 0.72 03/05/2022 1343  ?    ?Component Value Date/Time  ? CALCIUM 9.3 03/05/2022 1343  ? ALKPHOS 55 03/05/2022 1343  ? AST 14 (L) 03/05/2022 1343  ? ALT 13 03/05/2022 1343  ?  BILITOT 0.3 03/05/2022 1343  ?  ? ? ? ?RADIOGRAPHIC STUDIES: ?DG Chest 2 View ? ?Result Date: 02/15/2022 ?CLINICAL DATA:  s/p RATs, lobectomy 2/23 EXAM: CHEST - 2 VIEW COMPARISON:  01/10/2022 FINDINGS: Normal heart si

## 2022-03-05 NOTE — Progress Notes (Signed)
Oncology Nurse Navigator Documentation ? ? ?  03/05/2022  ?  3:00 PM 03/02/2022  ?  9:00 AM  ?Oncology Nurse Navigator Flowsheets  ?Abnormal Finding Date 11/05/2021   ?Confirmed Diagnosis Date 12/18/2021   ?Diagnosis Status Confirmed Diagnosis Complete   ?Planned Course of Treatment Surgery   ?Phase of Treatment No Treatment/ Observation   ?No Treatment Reason: Other   ?Surgery Actual Start Date: 12/18/2021   ?Navigator Follow Up Date:  03/05/2022  ?Navigator Follow Up Reason:  New Patient Appointment  ?Navigation Complete Date: 03/05/2022   ?Post Navigation: Continue to Follow Patient? No   ?Reason Not Navigating Patient: No Treatment, Observation Only   ?Navigator Location [facility]  ?Referral Date to RadOnc/MedOnc  03/02/2022  ?Navigator Encounter Type Clinic/MDC;Initial MedOnc Other:  ?Treatment Initiated Date 12/18/2021   ?Patient Visit Type Initial;MedOnc   ?Treatment Phase Other   ?Barriers/Navigation Needs Education Coordination of Care  ?Education Newly Diagnosed Cancer Education;Other/I spoke with Caitlin [name] today at her first visit to see Dr. Julien Nordmann.  Her plan of care is observation at this time.  I did explain follow up schedule and added information on her AVS.  It was a pleasure meeting Caitlin [name] today.     ?Interventions Education;Psycho-Social Support Coordination of Care  ?Acuity Level 2-Minimal Needs (1-2 Barriers Identified) Level 2-Minimal Needs (1-2 Barriers Identified)  ?Coordination of Care  Other  ?Education Method Verbal;Other   ?Time Spent with Patient 30 30  ?  ?

## 2022-03-05 NOTE — Patient Instructions (Signed)
Thank you for choosing [facility] to provide your care.   ?Should you have questions after your visit to the [facility]), please contact this office at [phone] between 8:30 AM and 4:30 PM.  ?Voice mails left after 4:00 PM may not be returned until the following business day.  ?Calls received after 4:30 PM will be answered by an off-site Nurse Triage Line. ?   ?Prescription Refills:  Please have your pharmacy contact us directly for most prescription requests.  Contact the office directly for refills of narcotics (pain medications). Allow 48-72 hours for refills. ? ?Appointments: Please contact the [facility] scheduling department [phone] for questions regarding [facility] appointment scheduling.  Contact the schedulers with any scheduling changes so that your appointment can be rescheduled in a timely manner.  ? ?Central Scheduling for [facility] [phone] - Call to schedule procedures such as PET scans, CT scans, MRI, Ultrasound, etc. ? ?To afford each patient quality time with our providers, please arrive 30 minutes before your scheduled appointment time.  If you arrive late for your appointment, you may be asked to reschedule.  We strive to give you quality time with our providers, and arriving late affects you and other patients whose appointments are after yours. If you are a no show for multiple scheduled visits, you may be dismissed from the clinic at the providers discretion.   ?  ?Resources: ?[place] [employer] [phone] for additional information on assistance programs or assistance connecting with community support programs   ?[place]  [phone]: Information regarding food stamps, Medicaid, and utility assistance ?[place] [employer] [place] [employer]'s shared-ride transportation service for eligible riders who have a disability that prevents them from riding the fixed route bus.   ?Medicare [place] [phone]  Helps people with Medicare understand their rights and benefits, navigate the Medicare system, and secure the quality healthcare they deserve ?[place] 2562012151 Assists patients locate various types of support and financial assistance ?Cancer Care: 1-800-813-HOPE 863-296-8734) Provides financial assistance, online support groups, medication/co-pay assistance.   ?Transportation Assistance for appointments at [facility] ? ?Again, thank you for choosing [facility] for your care.    ?   ?Lung Cancer ?Lung cancer is an abnormal growth of cancerous cells that forms a mass (malignant tumor) in a lung. There are several types of lung cancer. The types are based on the appearance of the tumor cells. The two most common types are: ?Non-small cell lung cancer. This type of lung cancer is the most common type. Non-small cell lung cancers include squamous cell carcinoma, adenocarcinoma, and large cell carcinoma. ?Small cell lung cancer. In this type of lung cancer, abnormal cells are smaller than those of non-small cell lung cancer. Small cell lung cancer gets worse (progresses) faster than non-small cell lung cancer. ?What are the causes? ?The most common cause of lung cancer is smoking tobacco. The second most common cause is exposure to a chemical called radon. ?What increases the risk? ?You are more likely to develop this condition if: ?You smoke tobacco. ?You have been exposed to: ?Secondhand tobacco smoke. ?Radon gas. ?Uranium. ?Asbestos. ?Arsenic in drinking water. ?Air pollution and diesel exhaust. ?You have a family or personal history of lung cancer. ?You have had lung radiation therapy in the past. ?You are older than age 21. ?What are the signs or symptoms? ?In the early stages, you may not have any symptoms. As the cancer progresses, symptoms may include: ?A  lasting cough, possibly with blood. ?Fatigue. ?Unexplained weight loss. ?Shortness of breath. ?High-pitched whistling sounds  when you breathe, most often when you breathe out (wheezing). ?Chest pain. ?Loss of appetite. ?Symptoms of advanced lung cancer include: ?Hoarseness. ?Bone or joint pain. ?Weakness. ?Change in the structure of the fingernails (clubbing), so that the nail looks like an upside-down spoon. ?Swelling of the face or arms. ?Inability to move the face (paralysis). ?Drooping eyelids. ?How is this diagnosed? ?This condition may be diagnosed based on: ?Your symptoms and medical history. ?A physical exam. ?A chest X-ray. ?A CT scan. ?Blood tests. ?Sputum tests. ?Removal of a sample of lung tissue (lung biopsy) for testing. ?Your cancer will be assessed (staged) to determine how severe it is and how much it has spread (metastasized). ?How is this treated? ?Treatment depends on the type and stage of your cancer. Treatment may include one or more of the following: ?Surgery to remove as much of the cancer as possible. Lymph nodes in the area may be removed and tested for cancer as well. ?Medicines that kill cancer cells (chemotherapy). ?High-energy rays that kill cancer cells (radiation therapy). ?Targeted therapy. This targets specific parts of cancer cells and the area around them to block the growth and spread of the cancer. Targeted therapy can help limit the damage to healthy cells. ?Immunotherapy. This treatment uses a person's own immune system to fight cancer by either boosting the immune system or changing how the immune system works. ?Follow these instructions at home: ? ?Do not use any products that contain nicotine or tobacco. These products include cigarettes, chewing tobacco, and vaping devices, such as e-cigarettes. If you need help quitting, ask your health care provider. ?Do not drink alcohol. ?If you are admitted to the hospital, make sure your cancer specialist (oncologist) is aware. Your cancer may affect your treatment for other conditions. ?Take over-the-counter and prescription medicines only as told by your  health care provider. ?Work with your health care provider to manage any side effects of treatment. ?Keep all follow-up visits. This is important. ?Where to find support ?Consider joining a local support group for people who have been diagnosed with lung cancer. ?Where to find more information ?American Cancer Society: www.cancer.org ?[place] www.cancer.gov ?Contact a health care provider if you: ?Lose weight without trying. ?Have a persistent cough and wheezing. ?Feel short of breath. ?Get tired easily. ?Have bone or joint pain. ?Have difficulty swallowing. ?Notice that your voice is changing or getting hoarse. ?Have pain that does not get better with medicine. ?Get help right away if you: ?Cough up blood. ?Have chest pain or new breathing problems. ?Have a fever. ?Have swelling in an ankle, leg, or arm, or the face or neck. ?Have paralysis in your face. ?Are very confused. ?Have a drooping eyelid. ?These symptoms may represent a serious problem that is an emergency. Do not wait to see if the symptoms will go away. Get medical help right away. Call your local emergency services (911 in the [place] Do not drive yourself to the hospital. ?Summary ?Lung cancer is an abnormal growth of cancerous cells that forms a mass (malignant tumor) in a lung. ?There are several types of lung cancer. The types are based on the appearance of the tumor cells. The two most common types are non-small cell and small cell. ?The most common cause of lung cancer is smoking tobacco. ?Early symptoms include a lasting cough, possibly with blood, and fatigue, unexplained weight loss, and shortness of breath. ?After diagnosis,  treatment depends on the type and stage of your cancer. ?This information is not intended to replace advice given to you by your health care provider. Make sure you discuss any questions you have with your health care provider. ?Document Revised: 04/23/2021 Document Reviewed: 04/23/2021 ?Elsevier  Patient Education ? [place]. ? ?

## 2022-03-06 ENCOUNTER — Ambulatory Visit: Payer: Managed Care, Other (non HMO) | Admitting: Thoracic Surgery (Cardiothoracic Vascular Surgery)

## 2022-03-24 ENCOUNTER — Other Ambulatory Visit (HOSPITAL_BASED_OUTPATIENT_CLINIC_OR_DEPARTMENT_OTHER): Payer: Self-pay | Admitting: Family Medicine

## 2022-03-24 ENCOUNTER — Encounter: Payer: Self-pay | Admitting: Family Medicine

## 2022-03-24 ENCOUNTER — Ambulatory Visit (INDEPENDENT_AMBULATORY_CARE_PROVIDER_SITE_OTHER): Payer: Commercial Managed Care - HMO | Admitting: Family Medicine

## 2022-03-24 VITALS — BP 111/79 | HR 72 | Temp 98.1°F | Resp 16 | Wt 205.0 lb

## 2022-03-24 DIAGNOSIS — Z13220 Encounter for screening for lipoid disorders: Secondary | ICD-10-CM

## 2022-03-24 DIAGNOSIS — Z0000 Encounter for general adult medical examination without abnormal findings: Secondary | ICD-10-CM | POA: Diagnosis not present

## 2022-03-24 DIAGNOSIS — Z130 Encounter for screening for diseases of the blood and blood-forming organs and certain disorders involving the immune mechanism: Secondary | ICD-10-CM

## 2022-03-24 DIAGNOSIS — Z0289 Encounter for other administrative examinations: Secondary | ICD-10-CM

## 2022-03-24 NOTE — Progress Notes (Signed)
? ?Established Patient Office Visit ? ?Subjective   ? ?Patient ID: Caitlin [name], female    DOB: 05-Nov-1966  Age: 56 y.o. MRN: [id number] ? ?CC:  ?Chief Complaint  ?Patient presents with  ? Follow-up  ?  6 month  ? ? ?HPI ?Caitlin [name] presents to establish care with a new provider and for routine annual exam. She reports that she has had recent surgery and management for lung cancer which she reports has been successful. She denies acute complaints.  ? ? ?Outpatient Encounter Medications as of 03/24/2022  ?Medication Sig  ? METAMUCIL FIBER PO Take 3 tablets by mouth daily.  ? albuterol (VENTOLIN HFA) 108 (90 Base) MCG/ACT inhaler Inhale 2 puffs into the lungs every 4 (four) hours as needed for wheezing or shortness of breath. (Patient not taking: Reported on 03/05/2022)  ? atorvastatin (LIPITOR) 10 MG tablet Take 1 tablet (10 mg total) by mouth daily. (Patient not taking: Reported on 03/24/2022)  ? umeclidinium-vilanterol (ANORO ELLIPTA) 62.5-25 MCG/ACT AEPB Inhale 1 puff into the lungs daily at 6 (six) AM. (Patient not taking: Reported on 03/05/2022)  ? ?No facility-administered encounter medications on file as of 03/24/2022.  ? ? ?Past Medical History:  ?Diagnosis Date  ? CHF (congestive heart failure) ([place])   ? COPD (chronic obstructive pulmonary disease) ([place])   ? HLD (hyperlipidemia)   ? tx w/atorvastatin  ? NSTEMI (non-ST elevated myocardial infarction) ([place])   ? pt denies  ? Takotsubo cardiomyopathy   ? ? ?Past Surgical History:  ?Procedure Laterality Date  ? BRONCHIAL BIOPSY  01/08/2022  ? Procedure: BRONCHIAL BIOPSIES;  Surgeon: Garner Nash, DO;  Location: [facility];  Service: Pulmonary;;  ? BRONCHIAL BRUSHINGS  01/08/2022  ? Procedure: BRONCHIAL BRUSHINGS;  Surgeon: Garner Nash, DO;  Location: [facility];  Service: Pulmonary;;  ? BRONCHIAL NEEDLE ASPIRATION BIOPSY  01/08/2022  ? Procedure: BRONCHIAL NEEDLE ASPIRATION BIOPSIES;  Surgeon: Garner Nash, DO;  Location: [facility];   Service: Pulmonary;;  ? FIDUCIAL MARKER PLACEMENT  01/08/2022  ? Procedure: FIDUCIAL DYE MARKING;  Surgeon: Garner Nash, DO;  Location: [facility];  Service: Pulmonary;;  ? INTERCOSTAL NERVE BLOCK Left 01/08/2022  ? Procedure: INTERCOSTAL NERVE BLOCK;  Surgeon: Lajuana Matte, MD;  Location: [facility];  Service: Thoracic;  Laterality: Left;  ? LEFT HEART CATH AND CORONARY ANGIOGRAPHY N/A 07/15/2017  ? Procedure: LEFT HEART CATH AND CORONARY ANGIOGRAPHY;  Surgeon: Martinique, Peter M, MD;  Location: [facility];  Service: Cardiovascular;  Laterality: N/A;  ? NODE DISSECTION Left 01/08/2022  ? Procedure: NODE DISSECTION;  Surgeon: Lajuana Matte, MD;  Location: [facility];  Service: Thoracic;  Laterality: Left;  ? TRACHEOSTOMY CLOSURE  2018  ? VIDEO BRONCHOSCOPY WITH RADIAL ENDOBRONCHIAL ULTRASOUND  01/08/2022  ? Procedure: VIDEO BRONCHOSCOPY WITH RADIAL ENDOBRONCHIAL ULTRASOUND;  Surgeon: Garner Nash, DO;  Location: [facility];  Service: Pulmonary;;  ? WISDOM TOOTH EXTRACTION    ? ? ?Family History  ?Problem Relation Age of Onset  ? Heart disease Mother   ?     age 38's  ? Hypertension Mother   ? Pancreatic cancer Mother   ? Aneurysm Father   ?     brain  ? Colon cancer Neg Hx   ? Colon polyps Neg Hx   ? Esophageal cancer Neg Hx   ? Rectal cancer Neg Hx   ? Stomach cancer Neg Hx   ? ? ?Social History  ? ?Socioeconomic History  ?  Marital status: Divorced  ?  Spouse name: Not on file  ? Number of children: Not on file  ? Years of education: Not on file  ? Highest education level: Not on file  ?Occupational History  ? Occupation: CNA  ?Tobacco Use  ? Smoking status: Former  ?  Packs/day: 1.00  ?  Types: Cigarettes  ?  Quit date: 08/16/2017  ?  Years since quitting: 4.6  ? Smokeless tobacco: Never  ? Tobacco comments:  ?  quit 07/12/17  ?Vaping Use  ? Vaping Use: Never used  ?Substance and Sexual Activity  ? Alcohol use: Yes  ?  Comment: occasionally  ? Drug use: Not Currently  ?  Comment: Occasional  marijuana quit 6 months ago   ? Sexual activity: Yes  ?  Birth control/protection: None  ?Other Topics Concern  ? Not on file  ?Social History Narrative  ? Not on file  ? ?Social Determinants of Health  ? ?Financial Resource Strain: Not on file  ?Food Insecurity: Not on file  ?Transportation Needs: Not on file  ?Physical Activity: Not on file  ?Stress: Not on file  ?Social Connections: Not on file  ?Intimate Partner Violence: Not on file  ? ? ?Review of Systems  ?All other systems reviewed and are negative. ? ?  ? ? ?Objective   ? ?BP 111/79   Pulse 72   Temp 98.1 ?F (36.7 ?C) (Oral)   Resp 16   Wt 205 lb (93 kg)   LMP 09/09/2014 (Approximate)   SpO2 94%   BMI 30.27 kg/m?  ? ?Physical Exam ?Vitals and nursing note reviewed.  ?Constitutional:   ?   General: She is not in acute distress. ?HENT:  ?   Head: Normocephalic and atraumatic.  ?   Right Ear: Tympanic membrane, ear canal and external ear normal.  ?   Left Ear: Tympanic membrane, ear canal and external ear normal.  ?   Nose: Nose normal.  ?   Mouth/Throat:  ?   Mouth: Mucous membranes are moist.  ?   Pharynx: Oropharynx is clear.  ?Eyes:  ?   Conjunctiva/sclera: Conjunctivae normal.  ?   Pupils: Pupils are equal, round, and reactive to light.  ?Neck:  ?   Thyroid: No thyromegaly.  ?Cardiovascular:  ?   Rate and Rhythm: Normal rate and regular rhythm.  ?   Heart sounds: Normal heart sounds. No murmur heard. ?Pulmonary:  ?   Effort: Pulmonary effort is normal. No respiratory distress.  ?   Breath sounds: Normal breath sounds.  ?Abdominal:  ?   General: There is no distension.  ?   Palpations: Abdomen is soft. There is no mass.  ?   Tenderness: There is no abdominal tenderness.  ?Musculoskeletal:     ?   General: Normal range of motion.  ?   Cervical back: Normal range of motion and neck supple.  ?Skin: ?   General: Skin is warm and dry.  ?Neurological:  ?   General: No focal deficit present.  ?   Mental Status: She is alert and oriented to person, place,  and time.  ?Psychiatric:     ?   Mood and Affect: Mood normal.     ?   Behavior: Behavior normal.  ? ? ? ?  ? ?Assessment & Plan:  ? ?1. Annual physical exam ?Routine labs ordered (recently done labs were not repeated) ? ?2. Screening for lipid disorders ? ?- Lipid Panel ? ?3. Screening for  endocrine/metabolic/immunity disorders ? ?- TSH ?- Vitamin D, 25-hydroxy ? ? ? ?Return in about 1 year (around 03/25/2023) for physical.  ? ?Becky Sax, MD ? ? ?

## 2022-03-25 ENCOUNTER — Telehealth: Payer: Self-pay | Admitting: *Deleted

## 2022-03-25 LAB — TSH: TSH: 1.96 u[IU]/mL (ref 0.450–4.500)

## 2022-03-25 LAB — LIPID PANEL
Chol/HDL Ratio: 3.7 ratio (ref 0.0–4.4)
Cholesterol, Total: 160 mg/dL (ref 100–199)
HDL: 43 mg/dL (ref >39)
LDL Chol Calc (NIH): 98 mg/dL (ref 0–99)
Triglycerides: 102 mg/dL (ref 0–149)
VLDL Cholesterol Cal: 19 mg/dL (ref 5–40)

## 2022-03-25 LAB — VITAMIN D 25 HYDROXY (VIT D DEFICIENCY, FRACTURES): Vit D, 25-Hydroxy: 12.8 ng/mL — ABNORMAL LOW (ref 30.0–100.0)

## 2022-03-25 NOTE — Telephone Encounter (Signed)
Pt calling with questions regarding lab work she received on MyChart. Assured pt Dr. Redmond Pulling will provide recommendations once she has reviewed results and advise. Pt verbalizes understanding. ?

## 2022-03-26 ENCOUNTER — Encounter: Payer: Self-pay | Admitting: Family Medicine

## 2022-03-26 ENCOUNTER — Other Ambulatory Visit: Payer: Self-pay | Admitting: Family Medicine

## 2022-03-26 MED ORDER — VITAMIN D (ERGOCALCIFEROL) 1.25 MG (50000 UNIT) PO CAPS: 50000.0000 [IU] | ORAL_CAPSULE | ORAL | 0 refills | Status: DC

## 2022-05-15 ENCOUNTER — Ambulatory Visit (HOSPITAL_BASED_OUTPATIENT_CLINIC_OR_DEPARTMENT_OTHER)
Admission: RE | Admit: 2022-05-15 | Discharge: 2022-05-15 | Disposition: A | Discharge status: Home / Self Care | Payer: Commercial Managed Care - HMO | Source: Ambulatory Visit | Attending: Family Medicine | Admitting: Family Medicine

## 2022-05-15 DIAGNOSIS — Z1231 Encounter for screening mammogram for malignant neoplasm of breast: Secondary | ICD-10-CM | POA: Insufficient documentation

## 2022-05-15 DIAGNOSIS — Z0289 Encounter for other administrative examinations: Secondary | ICD-10-CM | POA: Insufficient documentation

## 2022-05-20 ENCOUNTER — Other Ambulatory Visit: Payer: Self-pay | Admitting: Family Medicine

## 2022-05-20 ENCOUNTER — Telehealth: Payer: Self-pay | Admitting: Family Medicine

## 2022-05-20 DIAGNOSIS — R928 Other abnormal and inconclusive findings on diagnostic imaging of breast: Secondary | ICD-10-CM

## 2022-05-20 NOTE — Telephone Encounter (Signed)
Copied from [place] [id number]. Topic: General - Other >> May 20, 2022  9:41 AM Everette C wrote: Reason for CRM: The patient would like to speak with a member of clinical staff when possible  The patient has received their imaging results and noticed they were abnormal   The patient would like to speak with a member of staff to review them further when possible

## 2022-05-20 NOTE — Telephone Encounter (Signed)
Patient was called and explained breast report to  patient. Patient verbalized understanding

## 2022-05-27 ENCOUNTER — Ambulatory Visit
Admission: RE | Admit: 2022-05-27 | Discharge: 2022-05-27 | Disposition: A | Discharge status: Home / Self Care | Payer: Commercial Managed Care - HMO | Source: Ambulatory Visit | Attending: Family Medicine | Admitting: Family Medicine

## 2022-05-27 ENCOUNTER — Ambulatory Visit: Payer: Commercial Managed Care - HMO

## 2022-05-27 DIAGNOSIS — R928 Other abnormal and inconclusive findings on diagnostic imaging of breast: Secondary | ICD-10-CM

## 2022-06-29 ENCOUNTER — Telehealth: Payer: Self-pay | Admitting: Internal Medicine

## 2022-06-29 NOTE — Telephone Encounter (Signed)
Called patient regarding October appointment, left a voicemail.

## 2022-07-23 ENCOUNTER — Ambulatory Visit (INDEPENDENT_AMBULATORY_CARE_PROVIDER_SITE_OTHER): Payer: Commercial Managed Care - HMO | Admitting: Family Medicine

## 2022-07-23 VITALS — BP 119/80 | HR 70 | Temp 97.9°F | Resp 16 | Ht 69.0 in | Wt 209.2 lb

## 2022-07-23 DIAGNOSIS — Z902 Acquired absence of lung [part of]: Secondary | ICD-10-CM

## 2022-07-23 NOTE — Progress Notes (Signed)
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

## 2022-07-24 ENCOUNTER — Encounter: Payer: Self-pay | Admitting: Family Medicine

## 2022-07-24 NOTE — Progress Notes (Signed)
Established Patient Office Visit  Subjective    Patient ID: Caitlin [name], female    DOB: February 20, 1966  Age: 56 y.o. MRN: [id number]  CC: No chief complaint on file.   HPI Caitlin [name] presents with complaint of unable to perform her work duties as before her recent lobectomy. She reports increased SOB as well as chest (wall) pain with activities of work.    Outpatient Encounter Medications as of 07/23/2022  Medication Sig   albuterol (VENTOLIN HFA) 108 (90 Base) MCG/ACT inhaler Inhale 2 puffs into the lungs every 4 (four) hours as needed for wheezing or shortness of breath. (Patient not taking: Reported on 03/05/2022)   atorvastatin (LIPITOR) 10 MG tablet Take 1 tablet (10 mg total) by mouth daily. (Patient not taking: Reported on 03/24/2022)   METAMUCIL FIBER PO Take 3 tablets by mouth daily.   umeclidinium-vilanterol (ANORO ELLIPTA) 62.5-25 MCG/ACT AEPB Inhale 1 puff into the lungs daily at 6 (six) AM. (Patient not taking: Reported on 03/05/2022)   Vitamin D, Ergocalciferol, (DRISDOL) 1.25 MG (50000 UNIT) CAPS capsule Take 1 capsule (50,000 Units total) by mouth every 7 (seven) days.   No facility-administered encounter medications on file as of 07/23/2022.    Past Medical History:  Diagnosis Date   CHF (congestive heart failure) (HCC)    COPD (chronic obstructive pulmonary disease) (HCC)    HLD (hyperlipidemia)    tx w/atorvastatin   NSTEMI (non-ST elevated myocardial infarction) ([place])    pt denies   Takotsubo cardiomyopathy     Past Surgical History:  Procedure Laterality Date   BRONCHIAL BIOPSY  01/08/2022   Procedure: BRONCHIAL BIOPSIES;  Surgeon: Garner Nash, DO;  Location: [facility];  Service: Pulmonary;;   BRONCHIAL BRUSHINGS  01/08/2022   Procedure: BRONCHIAL BRUSHINGS;  Surgeon: Garner Nash, DO;  Location: [facility];  Service: Pulmonary;;   BRONCHIAL NEEDLE ASPIRATION BIOPSY  01/08/2022   Procedure: BRONCHIAL NEEDLE ASPIRATION BIOPSIES;  Surgeon:  Garner Nash, DO;  Location: [facility];  Service: Pulmonary;;   FIDUCIAL MARKER PLACEMENT  01/08/2022   Procedure: FIDUCIAL DYE MARKING;  Surgeon: Garner Nash, DO;  Location: [facility];  Service: Pulmonary;;   INTERCOSTAL NERVE BLOCK Left 01/08/2022   Procedure: INTERCOSTAL NERVE BLOCK;  Surgeon: Lajuana Matte, MD;  Location: [facility];  Service: Thoracic;  Laterality: Left;   LEFT HEART CATH AND CORONARY ANGIOGRAPHY N/A 07/15/2017   Procedure: LEFT HEART CATH AND CORONARY ANGIOGRAPHY;  Surgeon: Martinique, Peter M, MD;  Location: [facility];  Service: Cardiovascular;  Laterality: N/A;   NODE DISSECTION Left 01/08/2022   Procedure: NODE DISSECTION;  Surgeon: Lajuana Matte, MD;  [facility];  Service: Thoracic;  Laterality: Left;   TRACHEOSTOMY CLOSURE  2018   VIDEO BRONCHOSCOPY WITH RADIAL ENDOBRONCHIAL ULTRASOUND  01/08/2022   Procedure: VIDEO BRONCHOSCOPY WITH RADIAL ENDOBRONCHIAL ULTRASOUND;  Surgeon: Garner Nash, DO;  Location: [facility];  Service: Pulmonary;;   WISDOM TOOTH EXTRACTION      Family History  Problem Relation Age of Onset   Heart disease Mother        age 26's   Hypertension Mother    Pancreatic cancer Mother    Aneurysm Father        brain   Colon cancer Neg Hx    Colon polyps Neg Hx    Esophageal cancer Neg Hx    Rectal cancer Neg Hx    Stomach cancer Neg Hx  Social History   Socioeconomic History   Marital status: Divorced    Spouse name: Not on file   Number of children: Not on file   Years of education: Not on file   Highest education level: Not on file  Occupational History   Occupation: CNA  Tobacco Use   Smoking status: Former    Packs/day: 1.00    Types: Cigarettes    Quit date: 08/16/2017    Years since quitting: 4.9   Smokeless tobacco: Never   Tobacco comments:    quit 07/12/17  Vaping Use   Vaping Use: Never used  Substance and Sexual Activity   Alcohol use: Yes    Comment: occasionally   Drug  use: Not Currently    Comment: Occasional marijuana quit 6 months ago    Sexual activity: Yes    Birth control/protection: None  Other Topics Concern   Not on file  Social History Narrative   Not on file   Social Determinants of Health   Financial Resource Strain: Not on file  Food Insecurity: Not on file  Transportation Needs: Not on file  Physical Activity: Not on file  Stress: Not on file  Social Connections: Not on file  Intimate Partner Violence: Not on file    Review of Systems  Respiratory:  Positive for shortness of breath. Negative for cough.   All other systems reviewed and are negative.       Objective    BP 119/80   Pulse 70   Temp 97.9 F (36.6 C) (Oral)   Resp 16   Ht 5\' 9"  (1.753 m)   Wt 209 lb 3.2 oz (94.9 kg)   LMP 09/09/2014 (Approximate)   SpO2 96%   BMI 30.89 kg/m   Physical Exam Vitals and nursing note reviewed.  Constitutional:      General: She is not in acute distress. Cardiovascular:     Rate and Rhythm: Normal rate and regular rhythm.  Pulmonary:     Effort: Pulmonary effort is normal.     Breath sounds: Normal breath sounds.  Neurological:     General: No focal deficit present.     Mental Status: She is alert and oriented to person, place, and time.         Assessment & Plan:   1. S/P lobectomy of lung Patient to keep scheduled appts and follow up with consultants regarding eval/mgt present sx and expected improvements if any. Also with regard to possible partial/full disability 2/2 sx. Patient v.u.    No follow-ups on file.   Becky Sax, MD

## 2022-08-12 ENCOUNTER — Other Ambulatory Visit: Payer: Self-pay

## 2022-08-12 DIAGNOSIS — R911 Solitary pulmonary nodule: Secondary | ICD-10-CM

## 2022-08-12 DIAGNOSIS — C3490 Malignant neoplasm of unspecified part of unspecified bronchus or lung: Secondary | ICD-10-CM

## 2022-08-31 ENCOUNTER — Ambulatory Visit
Admission: RE | Admit: 2022-08-31 | Discharge: 2022-08-31 | Disposition: A | Discharge status: Home / Self Care | Payer: Commercial Managed Care - HMO | Source: Ambulatory Visit | Attending: Internal Medicine | Admitting: Internal Medicine

## 2022-08-31 DIAGNOSIS — R911 Solitary pulmonary nodule: Secondary | ICD-10-CM

## 2022-08-31 DIAGNOSIS — C3490 Malignant neoplasm of unspecified part of unspecified bronchus or lung: Secondary | ICD-10-CM

## 2022-08-31 MED ORDER — IOPAMIDOL (ISOVUE-300) INJECTION 61%
75.0000 mL | Freq: Once | INTRAVENOUS | Status: AC | PRN
  Administered 2022-08-31: 75 mL via INTRAVENOUS

## 2022-09-04 ENCOUNTER — Other Ambulatory Visit: Payer: Commercial Managed Care - HMO

## 2022-09-07 ENCOUNTER — Inpatient Hospital Stay: Payer: Commercial Managed Care - HMO | Attending: Internal Medicine | Admitting: Internal Medicine

## 2022-09-07 ENCOUNTER — Inpatient Hospital Stay: Payer: Commercial Managed Care - HMO

## 2022-09-07 VITALS — BP 127/81 | HR 80 | Temp 98.3°F | Resp 18 | Ht 69.0 in | Wt 211.2 lb

## 2022-09-07 DIAGNOSIS — C3490 Malignant neoplasm of unspecified part of unspecified bronchus or lung: Secondary | ICD-10-CM | POA: Diagnosis not present

## 2022-09-07 DIAGNOSIS — C3412 Malignant neoplasm of upper lobe, left bronchus or lung: Secondary | ICD-10-CM | POA: Diagnosis present

## 2022-09-07 DIAGNOSIS — Z902 Acquired absence of lung [part of]: Secondary | ICD-10-CM | POA: Insufficient documentation

## 2022-09-07 LAB — CBC WITH DIFFERENTIAL (CANCER CENTER ONLY)
Abs Immature Granulocytes: 0.01 10*3/uL (ref 0.00–0.07)
Basophils Absolute: 0.1 10*3/uL (ref 0.0–0.1)
Basophils Relative: 1 %
Eosinophils Absolute: 0.3 10*3/uL (ref 0.0–0.5)
Eosinophils Relative: 4 %
HCT: 38.4 % (ref 36.0–46.0)
Hemoglobin: 12.8 g/dL (ref 12.0–15.0)
Immature Granulocytes: 0 %
Lymphocytes Relative: 54 %
Lymphs Abs: 4.2 10*3/uL — ABNORMAL HIGH (ref 0.7–4.0)
MCH: 28.3 pg (ref 26.0–34.0)
MCHC: 33.3 g/dL (ref 30.0–36.0)
MCV: 85 fL (ref 80.0–100.0)
Monocytes Absolute: 0.5 10*3/uL (ref 0.1–1.0)
Monocytes Relative: 7 %
Neutro Abs: 2.6 10*3/uL (ref 1.7–7.7)
Neutrophils Relative %: 34 %
Platelet Count: 312 10*3/uL (ref 150–400)
RBC: 4.52 MIL/uL (ref 3.87–5.11)
RDW: 13.7 % (ref 11.5–15.5)
WBC Count: 7.7 10*3/uL (ref 4.0–10.5)
nRBC: 0 % (ref 0.0–0.2)

## 2022-09-07 LAB — CMP (CANCER CENTER ONLY)
ALT: 15 U/L (ref 0–44)
AST: 15 U/L (ref 15–41)
Albumin: 4.2 g/dL (ref 3.5–5.0)
Alkaline Phosphatase: 53 U/L (ref 38–126)
Anion gap: 4 — ABNORMAL LOW (ref 5–15)
BUN: 10 mg/dL (ref 6–20)
CO2: 27 mmol/L (ref 22–32)
Calcium: 9.3 mg/dL (ref 8.9–10.3)
Chloride: 108 mmol/L (ref 98–111)
Creatinine: 0.81 mg/dL (ref 0.44–1.00)
GFR, Estimated: >60 mL/min (ref >60)
Glucose, Bld: 80 mg/dL (ref 70–99)
Potassium: 3.7 mmol/L (ref 3.5–5.1)
Sodium: 139 mmol/L (ref 135–145)
Total Bilirubin: 0.3 mg/dL (ref 0.3–1.2)
Total Protein: 7.2 g/dL (ref 6.5–8.1)

## 2022-09-07 NOTE — Progress Notes (Signed)
[facility] Telephone:([phone]) [phone]   Fax:(336) [phone]  OFFICE PROGRESS NOTE  Caitlin [name], [address] [place] [address]  DIAGNOSIS: Stage IA (T1b, N0, M0) non-small cell lung cancer, adenocarcinoma presented with left upper lobe lung nodule The patient also had suspicious right axillary lymph nodes that were hypermetabolic on the PET scan but likely to be reactive in nature secondary to her booster COVID-vaccine few weeks before.  PRIOR THERAPY:  status post left upper lobectomy with lymph node sampling under the care of Dr. Kipp Brood on January 08, 2022.  CURRENT THERAPY: Observation  INTERVAL HISTORY: Caitlin [name] 56 y.o. female returns to the clinic today for 23-month follow-up visit.  The patient is feeling fine today with no concerning complaints except for shortness of breath with exertion.  She denied having any chest pain, cough or hemoptysis.  She has no nausea, vomiting, diarrhea or constipation.  She has no headache or visual changes.  She denied having any recent weight loss or night sweats.  She is currently on observation and had repeat CT scan of the chest performed recently.  She is here for discussion of her scan results.  MEDICAL HISTORY: Past Medical History:  Diagnosis Date   CHF (congestive heart failure) (HCC)    COPD (chronic obstructive pulmonary disease) (HCC)    HLD (hyperlipidemia)    tx w/atorvastatin   NSTEMI (non-ST elevated myocardial infarction) ([place])    pt denies   Takotsubo cardiomyopathy     ALLERGIES:  is allergic to aspirin and tramadol.  MEDICATIONS:  Current Outpatient Medications  Medication Sig Dispense Refill   albuterol (VENTOLIN HFA) 108 (90 Base) MCG/ACT inhaler Inhale 2 puffs into the lungs every 4 (four) hours as needed for wheezing or shortness of breath. (Patient not taking: Reported on 03/05/2022) 18 g 3   atorvastatin (LIPITOR) 10 MG tablet Take 1 tablet (10 mg total) by mouth  daily. (Patient not taking: Reported on 03/24/2022) 90 tablet 3   METAMUCIL FIBER PO Take 3 tablets by mouth daily.     umeclidinium-vilanterol (ANORO ELLIPTA) 62.5-25 MCG/ACT AEPB Inhale 1 puff into the lungs daily at 6 (six) AM. (Patient not taking: Reported on 03/05/2022) 60 each 6   Vitamin D, Ergocalciferol, (DRISDOL) 1.25 MG (50000 UNIT) CAPS capsule Take 1 capsule (50,000 Units total) by mouth every 7 (seven) days. 12 capsule 0   No current facility-administered medications for this visit.    SURGICAL HISTORY:  Past Surgical History:  Procedure Laterality Date   BRONCHIAL BIOPSY  01/08/2022   Procedure: BRONCHIAL BIOPSIES;  Surgeon: Garner Nash, DO;  Location: [facility];  Service: Pulmonary;;   BRONCHIAL BRUSHINGS  01/08/2022   Procedure: BRONCHIAL BRUSHINGS;  Surgeon: Garner Nash, DO;  Location: [facility];  Service: Pulmonary;;   BRONCHIAL NEEDLE ASPIRATION BIOPSY  01/08/2022   Procedure: BRONCHIAL NEEDLE ASPIRATION BIOPSIES;  Surgeon: Garner Nash, DO;  Location: [facility];  Service: Pulmonary;;   FIDUCIAL MARKER PLACEMENT  01/08/2022   Procedure: FIDUCIAL DYE MARKING;  Surgeon: Garner Nash, DO;  Location: [facility];  Service: Pulmonary;;   INTERCOSTAL NERVE BLOCK Left 01/08/2022   Procedure: INTERCOSTAL NERVE BLOCK;  Surgeon: Lajuana Matte, MD;  Location: [facility];  Service: Thoracic;  Laterality: Left;   LEFT HEART CATH AND CORONARY ANGIOGRAPHY N/A 07/15/2017   Procedure: LEFT HEART CATH AND CORONARY ANGIOGRAPHY;  Surgeon: Martinique, Peter M, MD;  Location: [facility];  Service:  Cardiovascular;  Laterality: N/A;   NODE DISSECTION Left 01/08/2022   Procedure: NODE DISSECTION;  Surgeon: Lajuana Matte, MD;  Location: [facility];  Service: Thoracic;  Laterality: Left;   TRACHEOSTOMY CLOSURE  2018   VIDEO BRONCHOSCOPY WITH RADIAL ENDOBRONCHIAL ULTRASOUND  01/08/2022   Procedure: VIDEO BRONCHOSCOPY WITH RADIAL ENDOBRONCHIAL ULTRASOUND;  Surgeon: Garner Nash, DO;  [facility];  Service: Pulmonary;;   WISDOM TOOTH EXTRACTION      REVIEW OF SYSTEMS:  A comprehensive review of systems was negative except for: Respiratory: positive for dyspnea on exertion   PHYSICAL EXAMINATION: General appearance: alert, cooperative, and no distress Head: Normocephalic, without obvious abnormality, atraumatic Neck: no adenopathy, no JVD, supple, symmetrical, trachea midline, and thyroid not enlarged, symmetric, no tenderness/mass/nodules Lymph nodes: Cervical, supraclavicular, and axillary nodes normal. Resp: clear to auscultation bilaterally Back: symmetric, no curvature. ROM normal. No CVA tenderness. Cardio: regular rate and rhythm, S1, S2 normal, no murmur, click, rub or gallop GI: soft, non-tender; bowel sounds normal; no masses,  no organomegaly Extremities: extremities normal, atraumatic, no cyanosis or edema  ECOG PERFORMANCE STATUS: 1 - Symptomatic but completely ambulatory  Blood pressure 127/81, pulse 80, temperature 98.3 F (36.8 C), temperature source Oral, resp. rate 18, height 5\' 9"  (1.753 m), weight 211 lb 3 oz (95.8 kg), last menstrual period 09/09/2014, SpO2 97 %.  LABORATORY DATA: Lab Results  Component Value Date   WBC 7.7 09/07/2022   HGB 12.8 09/07/2022   HCT 38.4 09/07/2022   MCV 85.0 09/07/2022   PLT 312 09/07/2022      Chemistry      Component Value Date/Time   NA 141 03/05/2022 1343   NA 141 03/17/2021 0930   K 3.7 03/05/2022 1343   CL 111 03/05/2022 1343   CO2 24 03/05/2022 1343   BUN 8 03/05/2022 1343   BUN 9 03/17/2021 0930   CREATININE 0.72 03/05/2022 1343      Component Value Date/Time   CALCIUM 9.3 03/05/2022 1343   ALKPHOS 55 03/05/2022 1343   AST 14 (L) 03/05/2022 1343   ALT 13 03/05/2022 1343   BILITOT 0.3 03/05/2022 1343       RADIOGRAPHIC STUDIES: CT Chest W Contrast  Result Date: 09/01/2022 CLINICAL DATA:  Non-small cell lung cancer stagingw following LEFT upper lobectomy. *  Tracking Code: BO * EXAM: CT CHEST WITH CONTRAST TECHNIQUE: Multidetector CT imaging of the chest was performed during intravenous contrast administration. RADIATION DOSE REDUCTION: This exam was performed according to the departmental dose-optimization program which includes automated exposure control, adjustment of the mA and/or kV according to patient size and/or use of iterative reconstruction technique. CONTRAST:  29mL ISOVUE-300 IOPAMIDOL (ISOVUE-300) INJECTION 61% COMPARISON:  December 31, 2021 FINDINGS: Cardiovascular: Calcified and noncalcified aortic atherosclerosis. Normal heart size without substantial pericardial effusion or thickening. No aneurysmal dilation of the thoracic aorta. Central pulmonary vasculature mildly engorged with distortion following LEFT upper lobectomy. Mediastinum/Nodes: No thoracic inlet, axillary, mediastinal or hilar adenopathy. Esophagus grossly normal. Lungs/Pleura: Moderate to marked pulmonary emphysema worse in the RIGHT upper chest with both paraseptal and centrilobular changes. No consolidation or pleural effusion. Airways are patent. Upper Abdomen: Small splenules adjacent to the spleen are stable dating back to 2020. Adrenal glands are normal. Musculoskeletal: No chest wall abnormality. Mild expansion of the cortex of the LEFT posterior eighth rib is new from previous imaging with mild sclerosis (image 84/2) potentially related to thoracotomy changes or prior chest tube. IMPRESSION: 1. Status post LEFT upper lobectomy without  evidence of recurrent or metastatic disease. 2. Mild thickening of LEFT posterior eighth rib may relate to prior thoracotomy and or chest tube placement. Attention on subsequent imaging. 3. Moderate to marked pulmonary emphysema worse in the RIGHT upper chest with both paraseptal and centrilobular changes. 4. Aortic atherosclerosis. Aortic Atherosclerosis (ICD10-I70.0) and Emphysema (ICD10-J43.9). Electronically Signed   By: Zetta Bills M.D.    On: 09/01/2022 08:46    ASSESSMENT AND PLAN: This is a very pleasant 56 years old African-American female diagnosed with stage IA (T1b, N0, M0) non-small cell lung cancer, adenocarcinoma status post left upper lobectomy with lymph node sampling under the care of Dr. Kipp Brood on January 08, 2022. The patient is currently on observation and she is feeling fine. She had repeat CT scan of the chest performed recently.  I personally and independently reviewed the scan and discussed the result with the patient today. Her scan showed no concerning findings for disease recurrence or metastasis. I recommended for her to continue on observation with repeat CT scan of the chest in 6 months. The patient was advised to call immediately if she has any other concerning symptoms in the interval. The patient voices understanding of current disease status and treatment options and is in agreement with the current care plan.  All questions were answered. The patient knows to call the clinic with any problems, questions or concerns. We can certainly see the patient much sooner if necessary.  The total time spent in the appointment was 20 minutes.  Disclaimer: This note was dictated with voice recognition software. Similar sounding words can inadvertently be transcribed and may not be corrected upon review.

## 2022-09-22 ENCOUNTER — Other Ambulatory Visit: Payer: Self-pay | Admitting: Family Medicine

## 2022-09-22 NOTE — Telephone Encounter (Signed)
Requested medication (s) are due for refill today: yes  Requested medication (s) are on the active medication list: yes    Last refill: 03/26/22  #12  0 refills  Future visit scheduled no  Notes to clinic:Not delegated, please review. Thank you.  Requested Prescriptions  Pending Prescriptions Disp Refills   Vitamin D, Ergocalciferol, (DRISDOL) 1.25 MG (50000 UNIT) CAPS capsule [Pharmacy Med Name: VITAMIN D2 1.25MG (50,000 UNIT)] 4 capsule 2    Sig: Take 1 capsule (50,000 Units total) by mouth every 7 (seven) days.     Endocrinology:  Vitamins - Vitamin D Supplementation 2 Failed - 09/22/2022  7:27 AM      Failed - Manual Review: Route requests for 50,000 IU strength to the provider      Failed - Vitamin D in normal range and within 360 days    Vit D, 25-Hydroxy  Date Value Ref Range Status  03/24/2022 12.8 (L) 30.0 - 100.0 ng/mL Final    Comment:    Vitamin D deficiency has been defined by the [place] practice guideline as a level of serum 25-OH vitamin D less than 20 ng/mL (1,2). The Endocrine Society went on to further define vitamin D insufficiency as a level between 21 and 29 ng/mL (2). 1. IOM (Institute of Medicine). 2010. Dietary reference    intakes for calcium and D. [place]: The    [employer]. 2. Holick MF, Binkley Cyril, Bischoff-Ferrari HA, et al.    Evaluation, treatment, and prevention of vitamin D    deficiency: an Endocrine Society clinical practice    guideline. JCEM. 2011 Jul; 96(7):1911-30.          Passed - Ca in normal range and within 360 days    Calcium  Date Value Ref Range Status  09/07/2022 9.3 8.9 - 10.3 mg/dL Final         Passed - Valid encounter within last 12 months    Recent Outpatient Visits           2 months ago S/P lobectomy of lung   [facility] at [facility], MD   6 months ago Annual physical exam   [facility] at [facility], MD   12  months ago Centrilobular emphysema [facility])   [facility] Elsie Stain, MD   1 year ago Stage 1 mild COPD by GOLD classification [facility])   [place] Elsie Stain, MD   1 year ago Encounter for annual physical exam   Primary Care at [facility], Bayard Beaver, MD

## 2022-10-10 ENCOUNTER — Other Ambulatory Visit: Payer: Self-pay | Admitting: Critical Care Medicine

## 2022-10-13 NOTE — Telephone Encounter (Signed)
Requested Prescriptions  Pending Prescriptions Disp Refills   atorvastatin (LIPITOR) 10 MG tablet [Pharmacy Med Name: ATORVASTATIN 10 MG TABLET] 30 tablet 6    Sig: TAKE 1 TABLET BY MOUTH EVERY DAY     Cardiovascular:  Antilipid - Statins Failed - 10/10/2022 12:36 PM      Failed - Lipid Panel in normal range within the last 12 months    Cholesterol, Total  Date Value Ref Range Status  03/24/2022 160 100 - 199 mg/dL Final   LDL Chol Calc (NIH)  Date Value Ref Range Status  03/24/2022 98 0 - 99 mg/dL Final   HDL  Date Value Ref Range Status  03/24/2022 43 >39 mg/dL Final   Triglycerides  Date Value Ref Range Status  03/24/2022 102 0 - 149 mg/dL Final         Passed - Patient is not pregnant      Passed - Valid encounter within last 12 months    Recent Outpatient Visits           2 months ago S/P lobectomy of lung   Primary Care at [facility], MD   6 months ago Annual physical exam   Primary Care at [facility], MD   1 year ago Centrilobular emphysema [facility])   [place] Elsie Stain, MD   1 year ago Stage 1 mild COPD by GOLD classification [facility])   [place] Elsie Stain, MD   1 year ago Encounter for annual physical exam   Primary Care at [facility], Bayard Beaver, MD

## 2023-02-02 ENCOUNTER — Telehealth: Payer: Self-pay | Admitting: Internal Medicine

## 2023-02-02 NOTE — Telephone Encounter (Signed)
Called patient regarding upcoming April appointments, patient is notified. 

## 2023-02-25 ENCOUNTER — Telehealth: Payer: Self-pay | Admitting: Internal Medicine

## 2023-02-25 NOTE — Telephone Encounter (Signed)
Scheduled per 04/11 scheduled message, patient is notified.

## 2023-03-02 ENCOUNTER — Telehealth: Payer: Self-pay | Admitting: Internal Medicine

## 2023-03-02 NOTE — Telephone Encounter (Signed)
Scheduled per 04/16 in-basket message, patient has been called and voicemail was left.

## 2023-03-05 ENCOUNTER — Ambulatory Visit (HOSPITAL_COMMUNITY): Payer: Commercial Managed Care - HMO

## 2023-03-05 ENCOUNTER — Inpatient Hospital Stay: Payer: Commercial Managed Care - HMO

## 2023-03-05 ENCOUNTER — Other Ambulatory Visit: Payer: Commercial Managed Care - HMO

## 2023-03-09 ENCOUNTER — Ambulatory Visit: Payer: Commercial Managed Care - HMO | Admitting: Internal Medicine

## 2023-03-10 ENCOUNTER — Inpatient Hospital Stay: Payer: Commercial Managed Care - HMO | Attending: Internal Medicine

## 2023-03-10 ENCOUNTER — Ambulatory Visit (HOSPITAL_COMMUNITY)
Admission: RE | Admit: 2023-03-10 | Discharge: 2023-03-10 | Disposition: A | Discharge status: Home / Self Care | Payer: Commercial Managed Care - HMO | Source: Ambulatory Visit | Attending: Internal Medicine | Admitting: Internal Medicine

## 2023-03-10 DIAGNOSIS — C3490 Malignant neoplasm of unspecified part of unspecified bronchus or lung: Secondary | ICD-10-CM | POA: Insufficient documentation

## 2023-03-10 DIAGNOSIS — Z85118 Personal history of other malignant neoplasm of bronchus and lung: Secondary | ICD-10-CM | POA: Insufficient documentation

## 2023-03-10 DIAGNOSIS — J432 Centrilobular emphysema: Secondary | ICD-10-CM | POA: Diagnosis not present

## 2023-03-10 DIAGNOSIS — Z902 Acquired absence of lung [part of]: Secondary | ICD-10-CM | POA: Insufficient documentation

## 2023-03-10 LAB — CBC WITH DIFFERENTIAL (CANCER CENTER ONLY)
Abs Immature Granulocytes: 0 10*3/uL (ref 0.00–0.07)
Basophils Absolute: 0 10*3/uL (ref 0.0–0.1)
Basophils Relative: 1 %
Eosinophils Absolute: 0.2 10*3/uL (ref 0.0–0.5)
Eosinophils Relative: 4 %
HCT: 39.8 % (ref 36.0–46.0)
Hemoglobin: 13.2 g/dL (ref 12.0–15.0)
Immature Granulocytes: 0 %
Lymphocytes Relative: 54 %
Lymphs Abs: 3.2 10*3/uL (ref 0.7–4.0)
MCH: 28.2 pg (ref 26.0–34.0)
MCHC: 33.2 g/dL (ref 30.0–36.0)
MCV: 85 fL (ref 80.0–100.0)
Monocytes Absolute: 0.5 10*3/uL (ref 0.1–1.0)
Monocytes Relative: 9 %
Neutro Abs: 1.8 10*3/uL (ref 1.7–7.7)
Neutrophils Relative %: 32 %
Platelet Count: 335 10*3/uL (ref 150–400)
RBC: 4.68 MIL/uL (ref 3.87–5.11)
RDW: 13.9 % (ref 11.5–15.5)
WBC Count: 5.8 10*3/uL (ref 4.0–10.5)
nRBC: 0 % (ref 0.0–0.2)

## 2023-03-10 LAB — CMP (CANCER CENTER ONLY)
ALT: 14 U/L (ref 0–44)
AST: 15 U/L (ref 15–41)
Albumin: 4.2 g/dL (ref 3.5–5.0)
Alkaline Phosphatase: 50 U/L (ref 38–126)
Anion gap: 3 — ABNORMAL LOW (ref 5–15)
BUN: 8 mg/dL (ref 6–20)
CO2: 28 mmol/L (ref 22–32)
Calcium: 9.3 mg/dL (ref 8.9–10.3)
Chloride: 110 mmol/L (ref 98–111)
Creatinine: 0.73 mg/dL (ref 0.44–1.00)
GFR, Estimated: >60 mL/min (ref >60)
Glucose, Bld: 92 mg/dL (ref 70–99)
Potassium: 4.1 mmol/L (ref 3.5–5.1)
Sodium: 141 mmol/L (ref 135–145)
Total Bilirubin: 0.3 mg/dL (ref 0.3–1.2)
Total Protein: 7 g/dL (ref 6.5–8.1)

## 2023-03-10 MED ORDER — IOHEXOL 300 MG/ML  SOLN
75.0000 mL | Freq: Once | INTRAMUSCULAR | Status: AC | PRN
  Administered 2023-03-10: 100 mL via INTRAVENOUS

## 2023-03-10 MED ORDER — SODIUM CHLORIDE (PF) 0.9 % IJ SOLN
INTRAMUSCULAR | Status: AC
  Filled 2023-03-10: qty 50

## 2023-03-15 ENCOUNTER — Inpatient Hospital Stay (HOSPITAL_BASED_OUTPATIENT_CLINIC_OR_DEPARTMENT_OTHER): Payer: Commercial Managed Care - HMO | Admitting: Internal Medicine

## 2023-03-15 VITALS — BP 120/83 | HR 64 | Temp 97.0°F | Resp 13 | Wt 217.8 lb

## 2023-03-15 DIAGNOSIS — Z902 Acquired absence of lung [part of]: Secondary | ICD-10-CM | POA: Diagnosis not present

## 2023-03-15 DIAGNOSIS — C3490 Malignant neoplasm of unspecified part of unspecified bronchus or lung: Secondary | ICD-10-CM

## 2023-03-15 DIAGNOSIS — Z85118 Personal history of other malignant neoplasm of bronchus and lung: Secondary | ICD-10-CM | POA: Diagnosis present

## 2023-03-15 NOTE — Progress Notes (Signed)
[facility] [phone]   [phone]  OFFICE PROGRESS NOTE  Caitlin Skeans, MD [address] [place] [address]  DIAGNOSIS: Stage IA (T1b, N0, M0) non-small cell lung cancer, adenocarcinoma presented with left upper lobe lung nodule The patient also had suspicious right axillary lymph nodes that were hypermetabolic on the PET scan but likely to be reactive in nature secondary to her booster COVID-vaccine few weeks before.  PRIOR THERAPY:  status post left upper lobectomy with lymph node sampling under the care of Dr. Cliffton Asters on January 08, 2022.  CURRENT THERAPY: Observation  INTERVAL HISTORY: Caitlin [name] 57 y.o. female returns to the clinic today for follow-up visit.  The patient is feeling fine today with no concerning complaints.  She denied having any chest pain, shortness of breath, cough or hemoptysis.  She denied having any fever or chills.  She has no nausea, vomiting, diarrhea or constipation.  She has no headache or visual changes.  She denied having any recent weight loss or night sweats.  She is here today for evaluation with repeat CT scan of the chest for restaging of her disease.  MEDICAL HISTORY: Past Medical History:  Diagnosis Date   CHF (congestive heart failure) (HCC)    COPD (chronic obstructive pulmonary disease) (HCC)    HLD (hyperlipidemia)    tx w/atorvastatin   NSTEMI (non-ST elevated myocardial infarction) (HCC)    pt denies   Takotsubo cardiomyopathy     ALLERGIES:  is allergic to aspirin and tramadol.  MEDICATIONS:  Current Outpatient Medications  Medication Sig Dispense Refill   albuterol (VENTOLIN HFA) 108 (90 Base) MCG/ACT inhaler Inhale 2 puffs into the lungs every 4 (four) hours as needed for wheezing or shortness of breath. (Patient not taking: Reported on 03/05/2022) 18 g 3   atorvastatin (LIPITOR) 10 MG tablet Take 1 tablet (10 mg total) by mouth daily. (Patient not taking:  Reported on 03/24/2022) 90 tablet 3   METAMUCIL FIBER PO Take 3 tablets by mouth daily.     umeclidinium-vilanterol (ANORO ELLIPTA) 62.5-25 MCG/ACT AEPB Inhale 1 puff into the lungs daily at 6 (six) AM. (Patient not taking: Reported on 03/05/2022) 60 each 6   Vitamin D, Ergocalciferol, (DRISDOL) 1.25 MG (50000 UNIT) CAPS capsule Take 1 capsule (50,000 Units total) by mouth every 7 (seven) days. 12 capsule 0   No current facility-administered medications for this visit.    SURGICAL HISTORY:  Past Surgical History:  Procedure Laterality Date   BRONCHIAL BIOPSY  01/08/2022   Procedure: BRONCHIAL BIOPSIES;  Surgeon: Josephine Igo, DO;  [facility];  Service: Pulmonary;;   BRONCHIAL BRUSHINGS  01/08/2022   Procedure: BRONCHIAL BRUSHINGS;  Surgeon: Josephine Igo, DO;  [facility];  Service: Pulmonary;;   BRONCHIAL NEEDLE ASPIRATION BIOPSY  01/08/2022   Procedure: BRONCHIAL NEEDLE ASPIRATION BIOPSIES;  Surgeon: Josephine Igo, DO;  [facility];  Service: Pulmonary;;   FIDUCIAL MARKER PLACEMENT  01/08/2022   Procedure: FIDUCIAL DYE MARKING;  Surgeon: Josephine Igo, DO;  [facility];  Service: Pulmonary;;   INTERCOSTAL NERVE BLOCK Left 01/08/2022   Procedure: INTERCOSTAL NERVE BLOCK;  Surgeon: Corliss Skains, MD;  [facility];  Service: Thoracic;  Laterality: Left;   LEFT HEART CATH AND CORONARY ANGIOGRAPHY N/A 07/15/2017   Procedure: LEFT HEART CATH AND CORONARY ANGIOGRAPHY;  Surgeon: Swazil[place], Peter M, MD;  [facility];  Service: Cardiovascular;  Laterality: N/A;  NODE DISSECTION Left 01/08/2022   Procedure: NODE DISSECTION;  Surgeon: Corliss Skains, MD;  [facility];  Service: Thoracic;  Laterality: Left;   TRACHEOSTOMY CLOSURE  2018   VIDEO BRONCHOSCOPY WITH RADIAL ENDOBRONCHIAL ULTRASOUND  01/08/2022   Procedure: VIDEO BRONCHOSCOPY WITH RADIAL ENDOBRONCHIAL ULTRASOUND;  Surgeon: Josephine Igo, DO;  [facility];  Service: Pulmonary;;   WISDOM TOOTH EXTRACTION      REVIEW OF SYSTEMS:  A comprehensive review of systems was negative.   PHYSICAL EXAMINATION: General appearance: alert, cooperative, and no distress Head: Normocephalic, without obvious abnormality, atraumatic Neck: no adenopathy, no JVD, supple, symmetrical, trachea midline, and thyroid not enlarged, symmetric, no tenderness/mass/nodules Lymph nodes: Cervical, supraclavicular, and axillary nodes normal. Resp: clear to auscultation bilaterally Back: symmetric, no curvature. ROM normal. No CVA tenderness. Cardio: regular rate and rhythm, S1, S2 normal, no murmur, click, rub or gallop GI: soft, non-tender; bowel sounds normal; no masses,  no organomegaly Extremities: extremities normal, atraumatic, no cyanosis or edema  ECOG PERFORMANCE STATUS: 1 - Symptomatic but completely ambulatory  Blood pressure 127/81, pulse 80, temperature 98.3 F (36.8 C), temperature source Oral, resp. rate 18, height 5\' 9"  (1.753 m), weight 211 lb 3 oz (95.8 kg), last menstrual period 09/09/2014, SpO2 97 %.  LABORATORY DATA: Lab Results  Component Value Date   WBC 7.7 09/07/2022   HGB 12.8 09/07/2022   HCT 38.4 09/07/2022   MCV 85.0 09/07/2022   PLT 312 09/07/2022      Chemistry      Component Value Date/Time   NA 141 03/05/2022 1343   NA 141 03/17/2021 0930   K 3.7 03/05/2022 1343   CL 111 03/05/2022 1343   CO2 24 03/05/2022 1343   BUN 8 03/05/2022 1343   BUN 9 03/17/2021 0930   CREATININE 0.72 03/05/2022 1343      Component Value Date/Time   CALCIUM 9.3 03/05/2022 1343   ALKPHOS 55 03/05/2022 1343   AST 14 (L) 03/05/2022 1343   ALT 13 03/05/2022 1343   BILITOT 0.3 03/05/2022 1343       RADIOGRAPHIC STUDIES: CT Chest W Contrast  Result Date: 09/01/2022 CLINICAL DATA:  Non-small cell lung cancer stagingw following LEFT upper lobectomy. * Tracking Code: BO * EXAM: CT CHEST WITH CONTRAST TECHNIQUE: Multidetector CT imaging  of the chest was performed during intravenous contrast administration. RADIATION DOSE REDUCTION: This exam was performed according to the departmental dose-optimization program which includes automated exposure control, adjustment of the mA and/or kV according to patient size and/or use of iterative reconstruction technique. CONTRAST:  75mL ISOVUE-300 IOPAMIDOL (ISOVUE-300) INJECTION 61% COMPARISON:  December 31, 2021 FINDINGS: Cardiovascular: Calcified and noncalcified aortic atherosclerosis. Normal heart size without substantial pericardial effusion or thickening. No aneurysmal dilation of the thoracic aorta. Central pulmonary vasculature mildly engorged with distortion following LEFT upper lobectomy. Mediastinum/Nodes: No thoracic inlet, axillary, mediastinal or hilar adenopathy. Esophagus grossly normal. Lungs/Pleura: Moderate to marked pulmonary emphysema worse in the RIGHT upper chest with both paraseptal and centrilobular changes. No consolidation or pleural effusion. Airways are patent. Upper Abdomen: Small splenules adjacent to the spleen are stable dating back to 2020. Adrenal glands are normal. Musculoskeletal: No chest wall abnormality. Mild expansion of the cortex of the LEFT posterior eighth rib is new from previous imaging with mild sclerosis (image 84/2) potentially related to thoracotomy changes or prior chest tube. IMPRESSION: 1. Status post LEFT upper lobectomy without evidence of recurrent or metastatic disease. 2. Mild thickening of LEFT posterior eighth rib  may relate to prior thoracotomy and or chest tube placement. Attention on subsequent imaging. 3. Moderate to marked pulmonary emphysema worse in the RIGHT upper chest with both paraseptal and centrilobular changes. 4. Aortic atherosclerosis. Aortic Atherosclerosis (ICD10-I70.0) and Emphysema (ICD10-J43.9). Electronically Signed   By: Donzetta Kohut M.D.   On: 09/01/2022 08:46    ASSESSMENT AND PLAN: This is a very pleasant 56 years old  African-American female diagnosed with stage IA (T1b, N0, M0) non-small cell lung cancer, adenocarcinoma status post left upper lobectomy with lymph node sampling under the care of Dr. Cliffton Asters on January 08, 2022. The patient has been in observation since that time and she is feeling fine with no concerning complaints. She had repeat CT scan of the chest performed recently.  I personally and independently reviewed the scan and discussed results with the patient today. Her scan showed no concerning findings for disease recurrence or metastasis. I recommended for her to continue on observation with repeat CT scan of the chest in 6 months. The patient was advised to call immediately if she has any other concerning symptoms in the interval. The patient voices understanding of current disease status and treatment options and is in agreement with the current care plan.  All questions were answered. The patient knows to call the clinic with any problems, questions or concerns. We can certainly see the patient much sooner if necessary.  The total time spent in the appointment was 20 minutes.  Disclaimer: This note was dictated with voice recognition software. Similar sounding words can inadvertently be transcribed and may not be corrected upon review.

## 2023-04-06 ENCOUNTER — Other Ambulatory Visit (HOSPITAL_COMMUNITY)
Admission: RE | Admit: 2023-04-06 | Discharge: 2023-04-06 | Disposition: A | Discharge status: Home / Self Care | Payer: Commercial Managed Care - HMO | Source: Ambulatory Visit | Attending: Family Medicine | Admitting: Family Medicine

## 2023-04-06 ENCOUNTER — Ambulatory Visit (INDEPENDENT_AMBULATORY_CARE_PROVIDER_SITE_OTHER): Payer: Commercial Managed Care - HMO | Admitting: Family Medicine

## 2023-04-06 DIAGNOSIS — Z1329 Encounter for screening for other suspected endocrine disorder: Secondary | ICD-10-CM | POA: Diagnosis not present

## 2023-04-06 DIAGNOSIS — Z130 Encounter for screening for diseases of the blood and blood-forming organs and certain disorders involving the immune mechanism: Secondary | ICD-10-CM | POA: Diagnosis not present

## 2023-04-06 DIAGNOSIS — I5181 Takotsubo syndrome: Secondary | ICD-10-CM

## 2023-04-06 DIAGNOSIS — Z13228 Encounter for screening for other metabolic disorders: Secondary | ICD-10-CM

## 2023-04-06 DIAGNOSIS — Z0000 Encounter for general adult medical examination without abnormal findings: Secondary | ICD-10-CM

## 2023-04-06 DIAGNOSIS — Z13220 Encounter for screening for lipoid disorders: Secondary | ICD-10-CM

## 2023-04-06 NOTE — Progress Notes (Unsigned)
New Patient Office Visit  Subjective    Patient ID: Caitlin [name], female    DOB: [birth year]  Age: 57 y.o. MRN: [id number]  CC:  Chief Complaint  Patient presents with   Annual Exam   Gynecologic Exam    HPI Caitlin [name] presents to establish care ***  Outpatient Encounter Medications as of 04/06/2023  Medication Sig   albuterol (VENTOLIN HFA) 108 (90 Base) MCG/ACT inhaler Inhale 2 puffs into the lungs every 4 (four) hours as needed for wheezing or shortness of breath.   atorvastatin (LIPITOR) 10 MG tablet TAKE 1 TABLET BY MOUTH EVERY DAY   METAMUCIL FIBER PO Take 3 tablets by mouth daily.   umeclidinium-vilanterol (ANORO ELLIPTA) 62.5-25 MCG/ACT AEPB Inhale 1 puff into the lungs daily at 6 (six) AM.   [DISCONTINUED] Vitamin D, Ergocalciferol, (DRISDOL) 1.25 MG (50000 UNIT) CAPS capsule Take 1 capsule (50,000 Units total) by mouth every 7 (seven) days.   No facility-administered encounter medications on file as of 04/06/2023.    Past Medical History:  Diagnosis Date   CHF (congestive heart failure) (HCC)    COPD (chronic obstructive pulmonary disease) (HCC)    HLD (hyperlipidemia)    tx w/atorvastatin   NSTEMI (non-ST elevated myocardial infarction) (HCC)    pt denies   Takotsubo cardiomyopathy     Past Surgical History:  Procedure Laterality Date   BRONCHIAL BIOPSY  01/08/2022   Procedure: BRONCHIAL BIOPSIES;  Surgeon: Josephine Igo, DO;  [facility];  Service: Pulmonary;;   BRONCHIAL BRUSHINGS  01/08/2022   Procedure: BRONCHIAL BRUSHINGS;  Surgeon: Josephine Igo, DO;  [facility];  Service: Pulmonary;;   BRONCHIAL NEEDLE ASPIRATION BIOPSY  01/08/2022   Procedure: BRONCHIAL NEEDLE ASPIRATION BIOPSIES;  Surgeon: Josephine Igo, DO;  [facility];  Service: Pulmonary;;   FIDUCIAL MARKER PLACEMENT  01/08/2022   Procedure: FIDUCIAL DYE MARKING;  Surgeon: Josephine Igo, DO;  [facility];  Service: Pulmonary;;    INTERCOSTAL NERVE BLOCK Left 01/08/2022   Procedure: INTERCOSTAL NERVE BLOCK;  Surgeon: Corliss Skains, MD;  [facility];  Service: Thoracic;  Laterality: Left;   LEFT HEART CATH AND CORONARY ANGIOGRAPHY N/A 07/15/2017   Procedure: LEFT HEART CATH AND CORONARY ANGIOGRAPHY;  Surgeon: Swazil[place], Peter M, MD;  [facility];  Service: Cardiovascular;  Laterality: N/A;   NODE DISSECTION Left 01/08/2022   Procedure: NODE DISSECTION;  Surgeon: Corliss Skains, MD;  [facility];  Service: Thoracic;  Laterality: Left;   TRACHEOSTOMY CLOSURE  2018   VIDEO BRONCHOSCOPY WITH RADIAL ENDOBRONCHIAL ULTRASOUND  01/08/2022   Procedure: VIDEO BRONCHOSCOPY WITH RADIAL ENDOBRONCHIAL ULTRASOUND;  Surgeon: Josephine Igo, DO;  [facility];  Service: Pulmonary;;   WISDOM TOOTH EXTRACTION      Family History  Problem Relation Age of Onset   Heart disease Mother        age 10's   Hypertension Mother    Pancreatic cancer Mother    Aneurysm Father        brain   Colon cancer Neg Hx    Colon polyps Neg Hx    Esophageal cancer Neg Hx    Rectal cancer Neg Hx    Stomach cancer Neg Hx     Social History   Socioeconomic History   Marital status: Divorced    Spouse name: Not on file   Number of children: Not on file   Years of education: Not on  file   Highest education level: Not on file  Occupational History   Occupation: CNA  Tobacco Use   Smoking status: Former    Packs/day: 1    Types: Cigarettes    Quit date: 08/16/2017    Years since quitting: 5.6   Smokeless tobacco: Never   Tobacco comments:    quit 07/12/17  Vaping Use   Vaping Use: Never used  Substance and Sexual Activity   Alcohol use: Yes    Comment: occasionally   Drug use: Not Currently    Comment: Occasional marijuana quit 6 months ago    Sexual activity: Yes    Birth control/protection: None  Other Topics Concern   Not on file  Social History Narrative   Not on file   Social  Determinants of Health   Financial Resource Strain: Not on file  Food Insecurity: Not on file  Transportation Needs: Not on file  Physical Activity: Not on file  Stress: Not on file  Social Connections: Not on file  Intimate Partner Violence: Not on file    ROS      Objective    LMP 09/09/2014 (Approximate)   Physical Exam  {Labs (Optional):23779}    Assessment & Plan:   Problem List Items Addressed This Visit   None   No follow-ups on file.   Tommie Raymond, MD

## 2023-04-06 NOTE — Progress Notes (Unsigned)
Physical with PAP  Has concerns with heart history and referral to cardiology

## 2023-04-07 ENCOUNTER — Other Ambulatory Visit (HOSPITAL_COMMUNITY)
Admission: RE | Admit: 2023-04-07 | Discharge: 2023-04-07 | Disposition: A | Discharge status: Home / Self Care | Payer: Commercial Managed Care - HMO | Source: Ambulatory Visit | Attending: Family Medicine | Admitting: Family Medicine

## 2023-04-07 DIAGNOSIS — Z0000 Encounter for general adult medical examination without abnormal findings: Secondary | ICD-10-CM | POA: Insufficient documentation

## 2023-04-07 LAB — CMP14+EGFR
ALT: 27 IU/L (ref 0–32)
AST: 23 IU/L (ref 0–40)
Albumin/Globulin Ratio: 1.7 (ref 1.2–2.2)
Albumin: 4.7 g/dL (ref 3.8–4.9)
Alkaline Phosphatase: 67 IU/L (ref 44–121)
BUN/Creatinine Ratio: 12 (ref 9–23)
BUN: 9 mg/dL (ref 6–24)
Bilirubin Total: 0.4 mg/dL (ref 0.0–1.2)
CO2: 23 mmol/L (ref 20–29)
Calcium: 9.9 mg/dL (ref 8.7–10.2)
Chloride: 103 mmol/L (ref 96–106)
Creatinine, Ser: 0.73 mg/dL (ref 0.57–1.00)
Globulin, Total: 2.8 g/dL (ref 1.5–4.5)
Glucose: 84 mg/dL (ref 70–99)
Potassium: 4.9 mmol/L (ref 3.5–5.2)
Sodium: 139 mmol/L (ref 134–144)
Total Protein: 7.5 g/dL (ref 6.0–8.5)
eGFR: 95 mL/min/{1.73_m2} (ref >59)

## 2023-04-07 LAB — CBC WITH DIFFERENTIAL/PLATELET
Basophils Absolute: 0.1 10*3/uL (ref 0.0–0.2)
Basos: 1 %
EOS (ABSOLUTE): 0.3 10*3/uL (ref 0.0–0.4)
Eos: 5 %
Hematocrit: 43.7 % (ref 34.0–46.6)
Hemoglobin: 14.6 g/dL (ref 11.1–15.9)
Immature Grans (Abs): 0 10*3/uL (ref 0.0–0.1)
Immature Granulocytes: 0 %
Lymphocytes Absolute: 2.1 10*3/uL (ref 0.7–3.1)
Lymphs: 42 %
MCH: 28.2 pg (ref 26.6–33.0)
MCHC: 33.4 g/dL (ref 31.5–35.7)
MCV: 85 fL (ref 79–97)
Monocytes Absolute: 0.7 10*3/uL (ref 0.1–0.9)
Monocytes: 14 %
Neutrophils Absolute: 1.9 10*3/uL (ref 1.4–7.0)
Neutrophils: 38 %
Platelets: 318 10*3/uL (ref 150–450)
RBC: 5.17 x10E6/uL (ref 3.77–5.28)
RDW: 13.3 % (ref 11.7–15.4)
WBC: 5 10*3/uL (ref 3.4–10.8)

## 2023-04-07 LAB — CERVICOVAGINAL ANCILLARY ONLY
Bacterial Vaginitis (gardnerella): NEGATIVE
Candida Glabrata: NEGATIVE
Candida Vaginitis: NEGATIVE
Chlamydia: NEGATIVE
Comment: NEGATIVE
Comment: NEGATIVE
Comment: NEGATIVE
Comment: NEGATIVE
Comment: NEGATIVE
Comment: NORMAL
Neisseria Gonorrhea: NEGATIVE
Trichomonas: NEGATIVE

## 2023-04-07 LAB — LIPID PANEL
Chol/HDL Ratio: 4.1 ratio (ref 0.0–4.4)
Cholesterol, Total: 155 mg/dL (ref 100–199)
HDL: 38 mg/dL — ABNORMAL LOW (ref >39)
LDL Chol Calc (NIH): 97 mg/dL (ref 0–99)
Triglycerides: 110 mg/dL (ref 0–149)
VLDL Cholesterol Cal: 20 mg/dL (ref 5–40)

## 2023-04-07 LAB — HEMOGLOBIN A1C
Est. average glucose Bld gHb Est-mCnc: 128 mg/dL
Hgb A1c MFr Bld: 6.1 % — ABNORMAL HIGH (ref 4.8–5.6)

## 2023-04-07 LAB — VITAMIN D 25 HYDROXY (VIT D DEFICIENCY, FRACTURES): Vit D, 25-Hydroxy: 23.6 ng/mL — ABNORMAL LOW (ref 30.0–100.0)

## 2023-04-07 LAB — TSH: TSH: 2.16 u[IU]/mL (ref 0.450–4.500)

## 2023-04-08 ENCOUNTER — Encounter: Payer: Self-pay | Admitting: Family Medicine

## 2023-04-08 ENCOUNTER — Other Ambulatory Visit: Payer: Self-pay | Admitting: Family Medicine

## 2023-04-08 MED ORDER — VITAMIN D (ERGOCALCIFEROL) 1.25 MG (50000 UNIT) PO CAPS: 50000.0000 [IU] | ORAL_CAPSULE | ORAL | 0 refills | Status: DC

## 2023-04-08 NOTE — Telephone Encounter (Signed)
Duplicate request- signed by provider today-Rx 04/08/23 #12 Requested Prescriptions  Pending Prescriptions Disp Refills   Vitamin D, Ergocalciferol, (DRISDOL) 1.25 MG (50000 UNIT) CAPS capsule [Pharmacy Med Name: VITAMIN D2 1.25MG (50,000 UNIT)] 4 capsule 2    Sig: TAKE 1 CAPSULE (50,000 UNITS TOTAL) BY MOUTH EVERY 7 (SEVEN) DAYS     Endocrinology:  Vitamins - Vitamin D Supplementation 2 Failed - 04/08/2023  2:09 PM      Failed - Manual Review: Route requests for 50,000 IU strength to the provider      Failed - Vitamin D in normal range and within 360 days    Vit D, 25-Hydroxy  Date Value Ref Range Status  04/06/2023 23.6 (L) 30.0 - 100.0 ng/mL Final    Comment:    Vitamin D deficiency has been defined by the Institute of Medicine and an Endocrine Society practice guideline as a level of serum 25-OH vitamin D less than 20 ng/mL (1,2). The Endocrine Society went on to further define vitamin D insufficiency as a level between 21 and 29 ng/mL (2). 1. IOM (Institute of Medicine). 2010. Dietary reference    intakes for calcium and D. [place]: The    [employer]. 2. Holick MF, Binkley , Bischoff-Ferrari HA, et al.    Evaluation, treatment, and prevention of vitamin D    deficiency: an Endocrine Society clinical practice    guideline. JCEM. 2011 Jul; 96(7):1911-30.          Passed - Ca in normal range and within 360 days    Calcium  Date Value Ref Range Status  04/06/2023 9.9 8.7 - 10.2 mg/dL Final         Passed - Valid encounter within last 12 months    Recent Outpatient Visits           2 days ago Annual physical exam   [facility], MD   8 months ago S/P lobectomy of lung   [facility], MD   1 year ago Annual physical exam   [facility], MD   1 year ago Centrilobular emphysema [facility])   [facility] Storm Frisk, MD   2 years ago Stage 1 mild COPD by GOLD classification [facility])   [place] [facility] Storm Frisk, MD       Future Appointments             In 1 week Shari Prows, Kathlynn Grate, MD [id number]

## 2023-04-13 NOTE — Progress Notes (Signed)
Cardiology Office Note:   Date:  04/16/2023  ID:  Caitlin [name], DOB September 29, 1966, MRN [id number]  History of Present Illness:   Caitlin [name] is a 57 y.o. female COPD, Takostubo CM, lung cancer s/p LUL resection, COPD, history of tobacco abuse and HLD who was referred by Dr. Andrey Campanile for further evaluation of Takostubo CM.  Patient remotely seen by Dr. Jens Som. Patient admitted with NSTEMI in 06/2017. Cath with no disease. TTE consistent with takostubo with LVEF 40-45% with akinesis of the mid-apcial anteroseptal, anterior, inferoseptal and apical myocardium. EF improved in 08/2017 to 55-60%. Has not seen Cardiology since 2019.  Today, the patient states that she has been having progressive SOB over the past several years. This began after her hospitalization for COPD exacerbation with trach placement and has progressed since that time. She has also had a LUL lobectomy for stage 1A lung cancer. No associated chest pain, orthopnea, or PND. Has occasional LE edema with prolonged standing. Myoview in 12/2021 normal.   Past Medical History:  Diagnosis Date   CHF (congestive heart failure) (HCC)    COPD (chronic obstructive pulmonary disease) (HCC)    HLD (hyperlipidemia)    tx w/atorvastatin   NSTEMI (non-ST elevated myocardial infarction) (HCC)    pt denies   Takotsubo cardiomyopathy      ROS: As per HPI  Studies Reviewed:    EKG:  NSR, HR 63-personally reviewed  Cardiac Studies & Procedures   CARDIAC CATHETERIZATION  CARDIAC CATHETERIZATION 07/16/2017  Narrative  LV end diastolic pressure is severely elevated.  1. Normal coronary anatomy 2. Markedly elevated LVEDP  Plan: medical management of CHF.  Findings Coronary Findings Diagnostic  Dominance: Right  Left Main Vessel was injected. Vessel is normal in caliber. Vessel is angiographically normal.  Left Anterior Descending Vessel was injected. Vessel is normal in caliber. Vessel is angiographically normal.  Left  Circumflex Vessel was injected. Vessel is normal in caliber. Vessel is angiographically normal.  Right Coronary Artery Vessel was injected. Vessel is normal in caliber. Vessel is angiographically normal.  Intervention  No interventions have been documented.   STRESS TESTS  MYOCARDIAL PERFUSION IMAGING 12/23/2021  Narrative   The study is normal. Findings are consistent with no prior ischemia and no prior myocardial infarction. The study is low risk.   No ST deviation was noted.   LV perfusion is normal. There is no evidence of ischemia. There is no evidence of infarction.   Left ventricular function is normal. Nuclear stress EF: 59 %. The left ventricular ejection fraction is normal (55-65%). End diastolic cavity size is normal. End systolic cavity size is normal.   Prior study not available for comparison.   ECHOCARDIOGRAM  ECHOCARDIOGRAM COMPLETE 09/10/2017  Narrative *[place]* *[facility]* [address] [place], [place] [address] [phone]  ------------------------------------------------------------------- Transthoracic Echocardiography  Patient:    Caitlin, [name] MR #:       [id number] Study Date: 09/10/2017 Gender:     F Age:        1 Height:     175.3 cm Weight:     55.8 kg BSA:        1.64 m^2 Pt. Status: Room:       2M04C  ATTENDING    [name] PERFORMING   [facility], Inpatient SONOGRAPHER  Leta Jungling, RDCS ADMITTING    Erlinda Hong Irven Coe, [facility]    Erlinda Hong Thomas  cc:  -------------------------------------------------------------------  LV EF: 55% -   60%  ------------------------------------------------------------------- Indications:      CHF - 428.0.  ------------------------------------------------------------------- History:   PMH:  Stress Induced Cardiomyopathy.  Dyspnea.  Coronary artery disease.  Chronic obstructive pulmonary disease.  PMH: Myocardial  infarction.  ------------------------------------------------------------------- Study Conclusions  - Left ventricle: The cavity size was normal. Wall thickness was normal. Systolic function was normal. The estimated ejection fraction was in the range of 55% to 60%. Wall motion was normal; there were no regional wall motion abnormalities. The study is not technically sufficient to allow evaluation of LV diastolic function. - Tricuspid valve: There was moderate regurgitation. - Pulmonary arteries: Systolic pressure was mildly increased. PA peak pressure: 35 mm Hg (S). - Pericardium, extracardiac: A small pericardial effusion was identified at the apex. There was no evidence of hemodynamic compromise.  Impressions:  - EF is improved when compared to prior (45%)  ------------------------------------------------------------------- Study data:  Comparison was made to the study of 07/15/2017.  Study status:  Routine.  Procedure:  Patient on light sedation and ventilated. Near end of exam the sedation wore off and she had to be restrained until sedation could be given The patient was unable to respond to pain level query due to mechanical ventilation. The patient would not grade their pain level when asked, but did not appear in distress pre or post test. Transthoracic echocardiography. Image quality was adequate.  Study completion: There were no complications.          Transthoracic echocardiography.  M-mode, complete 2D, spectral Doppler, and color Doppler.  Birthdate:  Patient birthdate: [birth year].  Age:  Patient is 57 yr old.  Sex:  Gender: female.    BMI: 18.2 kg/m^2.  Blood pressure:     130/92  Patient status:  Inpatient.  Study date: Study date: 09/10/2017. Study time: 02:18 PM.  Location:  ICU/CCU  -------------------------------------------------------------------  ------------------------------------------------------------------- Left ventricle:  The cavity size was  normal. Wall thickness was normal. Systolic function was normal. The estimated ejection fraction was in the range of 55% to 60%. Wall motion was normal; there were no regional wall motion abnormalities. The study is not technically sufficient to allow evaluation of LV diastolic function.  ------------------------------------------------------------------- Aortic valve:   Trileaflet; normal thickness leaflets. Mobility was not restricted.  Doppler:  Transvalvular velocity was within the normal range. There was no stenosis. There was no regurgitation.  ------------------------------------------------------------------- Aorta:  Aortic root: The aortic root was normal in size.  ------------------------------------------------------------------- Mitral valve:   Structurally normal valve.   Mobility was not restricted.  Doppler:  Transvalvular velocity was within the normal range. There was no evidence for stenosis. There was no regurgitation.  ------------------------------------------------------------------- Left atrium:  The atrium was normal in size.  ------------------------------------------------------------------- Right ventricle:  The cavity size was normal. Wall thickness was normal. Systolic function was normal.  ------------------------------------------------------------------- Pulmonic valve:    Structurally normal valve.   Cusp separation was normal.  Doppler:  Transvalvular velocity was within the normal range. There was no evidence for stenosis. There was no regurgitation.  ------------------------------------------------------------------- Tricuspid valve:   Structurally normal valve.    Doppler: Transvalvular velocity was within the normal range. There was moderate regurgitation.  ------------------------------------------------------------------- Pulmonary artery:   The main pulmonary artery was normal-sized. Systolic pressure was mildly  increased.  ------------------------------------------------------------------- Right atrium:  The atrium was normal in size.  ------------------------------------------------------------------- Pericardium:  A small pericardial effusion was identified at the apex. There was no evidence of hemodynamic compromise.  ------------------------------------------------------------------- Systemic veins: Inferior vena  cava: The vessel was dilated. The respirophasic diameter changes were blunted (< 50%), consistent with elevated central venous pressure.  ------------------------------------------------------------------- Measurements  Left ventricle                         Value        Reference LV ID, ED, PLAX chordal        (L)     36.2  mm     43 - 52 LV ID, ES, PLAX chordal                27.5  mm     23 - 38 LV fx shortening, PLAX chordal (L)     24    %      >=29 LV PW thickness, ED                    6.06  mm     --------- IVS/LV PW ratio, ED            (H)     1.36         <=1.3 Stroke volume, 2D                      27    ml     --------- Stroke volume/bsa, 2D                  17    ml/m^2 ---------  Ventricular septum                     Value        Reference IVS thickness, ED                      8.24  mm     ---------  LVOT                                   Value        Reference LVOT ID, S                             20    mm     --------- LVOT area                              3.14  cm^2   --------- LVOT peak velocity, S                  78.3  cm/s   --------- LVOT mean velocity, S                  46.8  cm/s   --------- LVOT VTI, S                            8.52  cm     ---------  Aorta                                  Value        Reference Aortic root ID, ED  25    mm     ---------  Left atrium                            Value        Reference LA ID, A-P, ES                         22    mm     --------- LA ID/bsa, A-P                          1.35  cm/m^2 <=2.2 LA volume, ES, 1-p A4C                 17    ml     --------- LA volume/bsa, ES, 1-p A4C             10.4  ml/m^2 ---------  Pulmonary arteries                     Value        Reference PA pressure, S, DP             (H)     35    mm Hg  <=30  Tricuspid valve                        Value        Reference Tricuspid regurg peak velocity         224   cm/s   --------- Tricuspid peak RV-RA gradient          20    mm Hg  ---------  Systemic veins                         Value        Reference Estimated CVP                          15    mm Hg  ---------  Right ventricle                        Value        Reference RV pressure, S, DP             (H)     35    mm Hg  <=30  Legend: (L)  and  (H)  mark values outside specified reference range.  ------------------------------------------------------------------- Prepared and Electronically Authenticated by  Donato Schultz, M.D. 2018-10-26T16:33:33              Risk Assessment/Calculations:              Physical Exam:   VS:  BP 128/84   Pulse 63   Ht 5' 9.5" (1.765 m)   Wt 214 lb (97.1 kg)   LMP 09/09/2014 (Approximate)   SpO2 97%   BMI 31.15 kg/m    Wt Readings from Last 3 Encounters:  04/16/23 214 lb (97.1 kg)  03/15/23 217 lb 12.8 oz (98.8 kg)  09/07/22 211 lb 3 oz (95.8 kg)     GEN: Well nourished, well developed in no acute distress NECK: No JVD; No carotid bruits CARDIAC: RRR, no murmurs, rubs, gallops RESPIRATORY:  Diminished but clear ABDOMEN: Soft, non-tender,  non-distended EXTREMITIES:  No edema; No deformity   ASSESSMENT AND PLAN:    #DOE: #COPD: #History of Tobacco Abuse: #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 -Suspect the primary driver of her DOE is pulmonary in the setting of COPD and lung cancer with left lobectomy -Has history of Takostubo but EF recovered and she does not have evidence of volume overload on exam -Cath in 2018 with no disease, myoview 2023 normal, CT  scan without significant coronary Ca -Will check TTE and refer to pulm  #History of Takostubo CM: #Chronic Systolic HF with Improved EF: -Patient admitted with NSTEMI in 2018 found to have EF 40-45% and clean coronaries on cath consistent with takostubo CM -EF has since recovered to 55-60%  -Currently, euvolemic and compensated and suspect primary driver of dyspnea is pulmonary -Will repeat TTE for monitoring  #HLD: -Continue lipitor 10mg  daily -LDL 97 with no known CAD          Signed, Meriam Sprague, MD

## 2023-04-15 LAB — CYTOLOGY - PAP
Comment: NEGATIVE
Diagnosis: NEGATIVE
High risk HPV: NEGATIVE

## 2023-04-16 ENCOUNTER — Encounter: Payer: Self-pay | Admitting: Cardiology

## 2023-04-16 ENCOUNTER — Ambulatory Visit: Payer: Commercial Managed Care - HMO | Attending: Cardiology | Admitting: Cardiology

## 2023-04-16 VITALS — BP 128/84 | HR 63 | Ht 69.5 in | Wt 214.0 lb

## 2023-04-16 DIAGNOSIS — I5022 Chronic systolic (congestive) heart failure: Secondary | ICD-10-CM

## 2023-04-16 DIAGNOSIS — Z87891 Personal history of nicotine dependence: Secondary | ICD-10-CM

## 2023-04-16 DIAGNOSIS — R0609 Other forms of dyspnea: Secondary | ICD-10-CM | POA: Diagnosis not present

## 2023-04-16 DIAGNOSIS — J432 Centrilobular emphysema: Secondary | ICD-10-CM

## 2023-04-16 DIAGNOSIS — I5181 Takotsubo syndrome: Secondary | ICD-10-CM

## 2023-04-16 DIAGNOSIS — E7800 Pure hypercholesterolemia, unspecified: Secondary | ICD-10-CM | POA: Diagnosis not present

## 2023-04-16 DIAGNOSIS — R911 Solitary pulmonary nodule: Secondary | ICD-10-CM

## 2023-04-16 DIAGNOSIS — J449 Chronic obstructive pulmonary disease, unspecified: Secondary | ICD-10-CM

## 2023-04-16 NOTE — Patient Instructions (Signed)
Medication Instructions:   Your physician recommends that you continue on your current medications as directed. Please refer to the Current Medication list given to you today.  *If you need a refill on your cardiac medications before your next appointment, please call your pharmacy*   You have been referred to [facility] PULMONOLOGY     Testing/Procedures:  Your physician has requested that you have an echocardiogram. Echocardiography is a painless test that uses sound waves to create images of your heart. It provides your doctor with information about the size and shape of your heart and how well your heart's chambers and valves are working. This procedure takes approximately one hour. There are no restrictions for this procedure. Please do NOT wear cologne, perfume, aftershave, or lotions (deodorant is allowed). Please arrive 15 minutes prior to your appointment time.    Follow-Up: At [facility], you and your health needs are our priority.  As part of our continuing mission to provide you with exceptional heart care, we have created designated Provider Care Teams.  These Care Teams include your primary Cardiologist (physician) and Advanced Practice Providers (APPs -  Physician Assistants and Nurse Practitioners) who all work together to provide you with the care you need, when you need it.  We recommend signing up for the patient portal called "MyChart".  Sign up information is provided on this After Visit Summary.  MyChart is used to connect with patients for Virtual Visits (Telemedicine).  Patients are able to view lab/test results, encounter notes, upcoming appointments, etc.  Non-urgent messages can be sent to your provider as well.   To learn more about what you can do with MyChart, go to [email or web address].    Your next appointment:   6 month(s)  Provider:   DR. Jacques Navy

## 2023-04-26 ENCOUNTER — Other Ambulatory Visit: Payer: Self-pay | Admitting: Family Medicine

## 2023-04-26 DIAGNOSIS — Z1231 Encounter for screening mammogram for malignant neoplasm of breast: Secondary | ICD-10-CM

## 2023-05-04 ENCOUNTER — Institutional Professional Consult (permissible substitution): Payer: Commercial Managed Care - HMO | Admitting: Pulmonary Disease

## 2023-05-14 ENCOUNTER — Encounter (HOSPITAL_BASED_OUTPATIENT_CLINIC_OR_DEPARTMENT_OTHER): Payer: Self-pay | Admitting: Pulmonary Disease

## 2023-05-14 ENCOUNTER — Ambulatory Visit (INDEPENDENT_AMBULATORY_CARE_PROVIDER_SITE_OTHER): Payer: Commercial Managed Care - HMO | Admitting: Pulmonary Disease

## 2023-05-14 ENCOUNTER — Other Ambulatory Visit (HOSPITAL_COMMUNITY): Payer: Self-pay

## 2023-05-14 VITALS — BP 118/74 | HR 70 | Ht 69.5 in | Wt 214.0 lb

## 2023-05-14 DIAGNOSIS — R29898 Other symptoms and signs involving the musculoskeletal system: Secondary | ICD-10-CM

## 2023-05-14 DIAGNOSIS — J449 Chronic obstructive pulmonary disease, unspecified: Secondary | ICD-10-CM

## 2023-05-14 MED ORDER — UMECLIDINIUM-VILANTEROL 62.5-25 MCG/ACT IN AEPB: 1.0000 | INHALATION_SPRAY | Freq: Every day | RESPIRATORY_TRACT | 6 refills | Status: DC

## 2023-05-14 NOTE — Patient Instructions (Addendum)
COPD  --CONTINUE Anoro ONE puff ONCE a day --CONTINUE Albuterol AS NEEDED for shortness of breath or wheezing --Refer to Pulmonary Rehab  Stage IA lung adenocarcinoma s/p LUL lobectomy --ORDER pulmonary function tests at either [facility]

## 2023-05-14 NOTE — Progress Notes (Unsigned)
Subjective:   PATIENT ID: Caitlin [name] GENDER: female DOB: Feb 20, 1966, MRN: [id number]  Chief Complaint  Patient presents with   Consult    Referred by cardiologist for history of COPD. Had lung cancer a few years ago and had part of her lung removed. Wonders if this has something to do with the increased SOB. Denies any increased coughing.     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  She was previously seen by Pulmonary and CT surgery for LUL nodule and underwent LUL lobectomy on 01/08/23. Since the surgery she has had shortness of breath with exertion that worsen with uphill and stairs. Carrying her grandson Difficulty with speed walking. Denies coughing and wheezing. Not active baseline. She is slowly increasing to up to mile on some days. She tries not to use her albuterol inhaler very often due to the cost.   Social History: Former smoker 1 ppd x 30 years  I have personally reviewed patient's past medical/family/social history, allergies, current medications.  Past Medical History:  Diagnosis Date   CHF (congestive heart failure) (HCC)    COPD (chronic obstructive pulmonary disease) (HCC)    HLD (hyperlipidemia)    tx w/atorvastatin   NSTEMI (non-ST elevated myocardial infarction) (HCC)    pt denies   Takotsubo cardiomyopathy      Family History  Problem Relation Age of Onset   Heart disease Mother        age 78's   Hypertension Mother    Pancreatic cancer Mother    Aneurysm Father        brain   Colon cancer Neg Hx    Colon polyps Neg Hx    Esophageal cancer Neg Hx    Rectal cancer Neg Hx    Stomach cancer Neg Hx      Social History   Occupational History   Occupation: CNA   Tobacco Use   Smoking status: Former    Packs/day: 1    Types: Cigarettes    Quit date: 08/16/2017    Years since quitting: 5.7   Smokeless tobacco: Never   Tobacco comments:    quit 07/12/17  Vaping Use   Vaping Use: Never used  Substance and Sexual Activity   Alcohol use: Yes    Comment: occasionally   Drug use: Not Currently    Comment: Occasional marijuana quit 6 months ago    Sexual activity: Yes    Birth control/protection: None    Allergies  Allergen Reactions   Aspirin Nausea Only   Tramadol Rash     Outpatient Medications Prior to Visit  Medication Sig Dispense Refill   albuterol (VENTOLIN HFA) 108 (90 Base) MCG/ACT inhaler Inhale 2 puffs into the lungs every 4 (four) hours as needed for wheezing or shortness of breath. 18 g 3   atorvastatin (LIPITOR) 10 MG tablet TAKE 1 TABLET BY MOUTH EVERY DAY 30 tablet 6   METAMUCIL FIBER PO Take 3 tablets by mouth daily.     Omega-3 Fatty Acids (FISH OIL PO) Take 1 capsule by mouth daily.     umeclidinium-vilanterol (ANORO ELLIPTA) 62.5-25 MCG/ACT AEPB Inhale 1 puff into the lungs daily at 6 (six) AM. 60 each 6   Vitamin  D, Ergocalciferol, (DRISDOL) 1.25 MG (50000 UNIT) CAPS capsule Take 1 capsule (50,000 Units total) by mouth every 7 (seven) days. 12 capsule 0   No facility-administered medications prior to visit.    Review of Systems  Constitutional:  Negative for chills, diaphoresis, fever, malaise/fatigue and weight loss.  HENT:  Negative for congestion.   Respiratory:  Positive for shortness of breath. Negative for cough, hemoptysis, sputum production and wheezing.   Cardiovascular:  Negative for chest pain, palpitations and leg swelling.     Objective:   Vitals:   05/14/23 1430  BP: 118/74  Pulse: 70  SpO2: 100%  Weight: 214 lb (97.1 kg)  Height: 5' 9.5" (1.765 m)   SpO2: 100 % (on RA)  Physical Exam: General: Well-appearing, no acute distress HENT: [place], AT Eyes: EOMI, no scleral icterus Respiratory:  Clear to auscultation bilaterally.  No crackles, wheezing or rales Cardiovascular: RRR, -M/R/G, no JVD Extremities:-Edema,-tenderness Neuro: AAO x4, CNII-XII grossly intact Psych: Normal mood, normal affect  Data Reviewed:  Imaging: CT Chest 03/10/23 - S/p LUL lobectomy. 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 Discussed clinical course and management of COPD including bronchodilator regimen, preventive care including vaccinations and action plan for exacerbation. Discussed benefits of pulmonary rehab  COPD  --CONTINUE Anoro ONE puff ONCE a day --CONTINUE Albuterol AS NEEDED for shortness of breath or wheezing --Refer to Pulmonary Rehab  Stage IA lung adenocarcinoma s/p LUL lobectomy --ORDER pulmonary function tests at either [facility]  Health Maintenance Immunization History  Administered Date(s) Administered   Fluad Quad(high Dose 65+) 11/11/2020   Influenza Inj Mdck Quad Pf 08/04/2021   Influenza,inj,Quad PF,6+ Mos 10/22/2017, 11/22/2018, 07/18/2019   Influenza-Unspecified 11/22/2018   PFIZER(Purple Top)SARS-COV-2 Vaccination 02/03/2020, 02/26/2020, 10/17/2020   PNEUMOCOCCAL CONJUGATE-20 09/24/2021   Pfizer Covid-19  Vaccine Bivalent Booster 37yrs & up 09/16/2021   Pneumococcal Polysaccharide-23 11/22/2018   Tdap 04/16/2020   CT Lung Screen - per Oncology  Orders Placed This Encounter  Procedures   AMB referral to pulmonary rehabilitation    Referral Priority:   Routine    Referral Type:   Consultation    Number of Visits Requested:   1   Pulmonary function test    Standing Status:   Future    Standing Expiration Date:   05/13/2024    Order Specific Question:   Where should this test be performed?    Answer:   [facility]    Order Specific Question:   Full PFT: includes the following: basic spirometry, spirometry pre & post bronchodilator, diffusion capacity (DLCO), lung volumes  Answer:   Full PFT   Meds ordered this encounter  Medications   umeclidinium-vilanterol (ANORO ELLIPTA) 62.5-25 MCG/ACT AEPB    Sig: Inhale 1 puff into the lungs daily at 6 (six) AM.    Dispense:  60 each    Refill:  6    Return in about 3 months (around 08/14/2023).  I have spent a total time of 35-minutes on the day of the appointment reviewing prior documentation, coordinating care and discussing medical diagnosis and plan with the patient/family. Imaging, labs and tests included in this note have been reviewed and interpreted independently by me.  Audrie Kuri Mechele Collin, MD [facility] 05/14/2023 2:36 PM  Office Number [phone]

## 2023-05-16 ENCOUNTER — Encounter (HOSPITAL_BASED_OUTPATIENT_CLINIC_OR_DEPARTMENT_OTHER): Payer: Self-pay | Admitting: Pulmonary Disease

## 2023-05-18 ENCOUNTER — Ambulatory Visit (HOSPITAL_COMMUNITY): Payer: Commercial Managed Care - HMO | Attending: Internal Medicine

## 2023-05-18 DIAGNOSIS — I5181 Takotsubo syndrome: Secondary | ICD-10-CM

## 2023-05-18 DIAGNOSIS — R0609 Other forms of dyspnea: Secondary | ICD-10-CM

## 2023-05-18 LAB — ECHOCARDIOGRAM COMPLETE
Area-P 1/2: 3.45 cm2
S' Lateral: 3 cm

## 2023-05-19 ENCOUNTER — Ambulatory Visit
Admission: RE | Admit: 2023-05-19 | Discharge: 2023-05-19 | Disposition: A | Discharge status: Home / Self Care | Payer: Commercial Managed Care - HMO | Source: Ambulatory Visit | Attending: Family Medicine | Admitting: Family Medicine

## 2023-05-19 DIAGNOSIS — Z1231 Encounter for screening mammogram for malignant neoplasm of breast: Secondary | ICD-10-CM

## 2023-05-24 ENCOUNTER — Other Ambulatory Visit: Payer: Self-pay | Admitting: Family Medicine

## 2023-05-24 ENCOUNTER — Encounter (HOSPITAL_COMMUNITY): Payer: Self-pay

## 2023-05-25 ENCOUNTER — Telehealth: Payer: Self-pay | Admitting: Family Medicine

## 2023-05-25 NOTE — Telephone Encounter (Signed)
[facility] pharmacy needs clarification on instructions for how the medication should be taken, please advise Rosanne Ashing from [facility],[phone] and use [phone].

## 2023-05-25 NOTE — Telephone Encounter (Signed)
Jim from [employer] stated the instructions were missing from the order for atorvastatin (LIPITOR) 10 MG tablet. Please call him at [phone] and use ref#[id number].

## 2023-05-26 ENCOUNTER — Other Ambulatory Visit: Payer: Self-pay

## 2023-05-26 DIAGNOSIS — E782 Mixed hyperlipidemia: Secondary | ICD-10-CM

## 2023-05-26 MED ORDER — ATORVASTATIN CALCIUM 10 MG PO TABS
10.0000 mg | ORAL_TABLET | Freq: Every day | ORAL | 1 refills | Status: DC
Start: 2023-05-26 — End: 2024-02-21

## 2023-05-26 NOTE — Telephone Encounter (Signed)
Updated and sent.

## 2023-06-17 ENCOUNTER — Telehealth (HOSPITAL_COMMUNITY): Payer: Self-pay

## 2023-06-17 NOTE — Telephone Encounter (Signed)
Called to confirm appt. Pt confirmed appt. Instructed pt on proper footwear. Gave directions along with department number.

## 2023-06-18 ENCOUNTER — Encounter (HOSPITAL_COMMUNITY): Payer: Self-pay

## 2023-06-18 ENCOUNTER — Encounter (HOSPITAL_COMMUNITY)
Admission: RE | Admit: 2023-06-18 | Discharge: 2023-06-18 | Disposition: A | Discharge status: Home / Self Care | Payer: Commercial Managed Care - HMO | Source: Ambulatory Visit | Attending: Pulmonary Disease | Admitting: Pulmonary Disease

## 2023-06-18 VITALS — BP 122/76 | HR 81 | Wt 212.3 lb

## 2023-06-18 DIAGNOSIS — J449 Chronic obstructive pulmonary disease, unspecified: Secondary | ICD-10-CM | POA: Insufficient documentation

## 2023-06-18 NOTE — Progress Notes (Signed)
[name] 57 y.o. female  Initial Psychosocial Assessment  Pt psychosocial assessment reveals pt lives alone. 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 Pt hobbies include watching tv and walking her dog. Pt reports her stress level is low. Areas of stress/anxiety include health and finances.  Pt does not exhibit signs of depression.  Pt shows good  coping skills with positive outlook . Offered emotional support and reassurance. Monitor and evaluate progress toward psychosocial goal(s).  Goal(s): Improved management of stress Improved coping skills Help patient work toward returning to meaningful activities that improve patient's QOL and are attainable with patient's lung disease   06/18/2023 12:14 PM Ethelda Chick BS, ACSM-CEP 06/18/2023 12:16 PM

## 2023-06-18 NOTE — Progress Notes (Signed)
Pulmonary Individual Treatment Plan  Patient Details  Name: Caitlin [name] MRN: [id number] Date of Birth: 26-Sep-1966 Referring Provider:   Doristine Devoid Pulmonary Rehab Walk Test from 06/18/2023 in [facility]  Referring Provider Everardo All       Initial Encounter Date:  Flowsheet Row Pulmonary Rehab Walk Test from 06/18/2023 in [facility]  Date 06/18/23       Visit Diagnosis: Stage 2 moderate COPD by GOLD classification (HCC)  Patient's Home Medications on Admission:   Current Outpatient Medications:    albuterol (VENTOLIN HFA) 108 (90 Base) MCG/ACT inhaler, Inhale 2 puffs into the lungs every 4 (four) hours as needed for wheezing or shortness of breath., Disp: 18 g, Rfl: 3   atorvastatin (LIPITOR) 10 MG tablet, Take 1 tablet (10 mg total) by mouth daily., Disp: 90 tablet, Rfl: 1   METAMUCIL FIBER PO, Take 3 tablets by mouth daily., Disp: , Rfl:    Omega-3 Fatty Acids (FISH OIL PO), Take 1 capsule by mouth daily., Disp: , Rfl:    umeclidinium-vilanterol (ANORO ELLIPTA) 62.5-25 MCG/ACT AEPB, Inhale 1 puff into the lungs daily at 6 (six) AM., Disp: 60 each, Rfl: 6   Vitamin D, Ergocalciferol, (DRISDOL) 1.25 MG (50000 UNIT) CAPS capsule, Take 1 capsule (50,000 Units total) by mouth every 7 (seven) days., Disp: 12 capsule, Rfl: 0  Past Medical History: Past Medical History:  Diagnosis Date   CHF (congestive heart failure) (HCC)    COPD (chronic obstructive pulmonary disease) (HCC)    HLD (hyperlipidemia)    tx w/atorvastatin   NSTEMI (non-ST elevated myocardial infarction) (HCC)    pt denies   Takotsubo cardiomyopathy     Tobacco Use: Social History   Tobacco Use  Smoking Status Former   Current packs/day: 0.00   Types: Cigarettes   Quit date: 08/16/2017   Years since quitting: 5.8  Smokeless Tobacco Never  Tobacco Comments   quit 07/12/17, abstaining  8/24    Labs: Review Flowsheet  More data exists      Latest Ref Rng & Units 04/16/2020 03/17/2021 01/06/2022 03/24/2022 04/06/2023  Labs for ITP Cardiac and Pulmonary Rehab  Cholestrol 100 - 199 mg/dL 782  956  - 213  086   LDL (calc) 0 - 99 mg/dL 578  469  - 98  97   HDL-C >39 mg/dL 52  45  - 43  38   Trlycerides 0 - 149 mg/dL 95  73  - 629  528   Hemoglobin A1c 4.8 - 5.6 % - - - - 6.1   PH, Arterial 7.35 - 7.45 - - 7.42  - -  PCO2 arterial 32 - 48 mmHg - - 37  - -  Bicarbonate 20.0 - 28.0 mmol/L - - 24.0  - -  Acid-base deficit 0.0 - 2.0 mmol/L - - 0.2  - -  O2 Saturation % - - 98.9  - -    Details            Capillary Blood Glucose: Lab Results  Component Value Date   GLUCAP 79 12/16/2021   GLUCAP 94 09/28/2017   GLUCAP 95 09/28/2017   GLUCAP 108 (H) 09/27/2017   GLUCAP 89 09/27/2017     Pulmonary Assessment Scores:  Pulmonary Assessment Scores     Row Name 06/18/23 1050         ADL UCSD   ADL Phase Entry  SOB Score total 61       CAT Score   CAT Score 18       mMRC Score   mMRC Score 4             UCSD: Self-administered rating of dyspnea associated with activities of daily living (ADLs) 6-point scale (0 = "not at all" to 5 = "maximal or unable to do because of breathlessness")  Scoring Scores range from 0 to 120.  Minimally important difference is 5 units  CAT: CAT can identify the health impairment of COPD patients and is better correlated with disease progression.  CAT has a scoring range of zero to 40. The CAT score is classified into four groups of low (less than 10), medium (10 - 20), high (21-30) and very high (31-40) based on the impact level of disease on health status. A CAT score over 10 suggests significant symptoms.  A worsening CAT score could be explained by an exacerbation, poor medication adherence, poor inhaler technique, or progression of COPD or comorbid conditions.  CAT MCID is 2 points  mMRC: mMRC (Modified Medical Research  Council) Dyspnea Scale is used to assess the degree of baseline functional disability in patients of respiratory disease due to dyspnea. No minimal important difference is established. A decrease in score of 1 point or greater is considered a positive change.   Pulmonary Function Assessment:  Pulmonary Function Assessment - 06/18/23 1050       Breath   Shortness of Breath Limiting activity             Exercise Target Goals: Exercise Program Goal: Individual exercise prescription set using results from initial 6 min walk test and THRR while considering  patient's activity barriers and safety.   Exercise Prescription Goal: Initial exercise prescription builds to 30-45 minutes a day of aerobic activity, 2-3 days per week.  Home exercise guidelines will be given to patient during program as part of exercise prescription that the participant will acknowledge.  Activity Barriers & Risk Stratification:  Activity Barriers & Cardiac Risk Stratification - 06/18/23 1053       Activity Barriers & Cardiac Risk Stratification   Activity Barriers Shortness of Breath;Muscular Weakness;Deconditioning    Cardiac Risk Stratification Moderate             6 Minute Walk:  6 Minute Walk     Row Name 06/18/23 1152         6 Minute Walk   Phase Initial     Distance 1200 feet     Walk Time 6 minutes     # of Rest Breaks 0     MPH 2.27     METS 3.04     RPE 9     Perceived Dyspnea  1     VO2 Peak 10.65     Symptoms No     Resting HR 70 bpm     Resting BP 122/76     Resting Oxygen Saturation  99 %     Exercise Oxygen Saturation  during 6 min walk 96 %     Max Ex. HR 91 bpm     Max Ex. BP 132/72     2 Minute Post BP 110/72       Interval HR   1 Minute HR 85     2 Minute HR 89     3 Minute HR 91     4 Minute HR 90     5 Minute HR  96     6 Minute HR 91     2 Minute Post HR 60     Interval Heart Rate? Yes       Interval Oxygen   Interval Oxygen? Yes     Baseline Oxygen  Saturation % 99 %     1 Minute Oxygen Saturation % 98 %     1 Minute Liters of Oxygen 0 L     2 Minute Oxygen Saturation % 96 %     2 Minute Liters of Oxygen 0 L     3 Minute Oxygen Saturation % 96 %     3 Minute Liters of Oxygen 0 L     4 Minute Oxygen Saturation % 97 %     4 Minute Liters of Oxygen 0 L     5 Minute Oxygen Saturation % 96 %     5 Minute Liters of Oxygen 0 L     6 Minute Oxygen Saturation % 96 %     6 Minute Liters of Oxygen 0 L     2 Minute Post Oxygen Saturation % 98 %     2 Minute Post Liters of Oxygen 0 L              Oxygen Initial Assessment:  Oxygen Initial Assessment - 06/18/23 1049       Home Oxygen   Home Oxygen Device None    Sleep Oxygen Prescription None    Home Exercise Oxygen Prescription None    Home Resting Oxygen Prescription None    Compliance with Home Oxygen Use Yes      Initial 6 min Walk   Oxygen Used None      Program Oxygen Prescription   Program Oxygen Prescription None      Intervention   Short Term Goals To learn and exhibit compliance with exercise, home and travel O2 prescription;To learn and understand importance of maintaining oxygen saturations>88%;To learn and demonstrate proper use of respiratory medications;To learn and understand importance of monitoring SPO2 with pulse oximeter and demonstrate accurate use of the pulse oximeter.;To learn and demonstrate proper pursed lip breathing techniques or other breathing techniques. ;Other    Long  Term Goals Demonstrates proper use of MDI's;Other;Compliance with respiratory medication;Exhibits proper breathing techniques, such as pursed lip breathing or other method taught during program session;Maintenance of O2 saturations>88%;Verbalizes importance of monitoring SPO2 with pulse oximeter and return demonstration;Exhibits compliance with exercise, home  and travel O2 prescription             Oxygen Re-Evaluation:   Oxygen Discharge (Final Oxygen  Re-Evaluation):   Initial Exercise Prescription:  Initial Exercise Prescription - 06/18/23 1200       Date of Initial Exercise RX and Referring Provider   Date 06/18/23    Referring Provider Everardo All    Expected Discharge Date 09/09/23      Treadmill   MPH 2.2    Grade 0    Minutes 15    METs 3      Bike   Level 1    Minutes 15    METs 3      Prescription Details   Frequency (times per week) 2    Duration Progress to 30 minutes of continuous aerobic without signs/symptoms of physical distress      Intensity   THRR 40-80% of Max Heartrate 64-129    Ratings of Perceived Exertion 11-13    Perceived Dyspnea 0-4      Progression  Progression Continue to progress workloads to maintain intensity without signs/symptoms of physical distress.      Resistance Training   Training Prescription Yes    Weight blue bands    Reps 10-15             Perform Capillary Blood Glucose checks as needed.  Exercise Prescription Changes:   Exercise Comments:   Exercise Goals and Review:   Exercise Goals Re-Evaluation :   Discharge Exercise Prescription (Final Exercise Prescription Changes):   Nutrition:  Target Goals: Understanding of nutrition guidelines, daily intake of sodium 1500mg , cholesterol 200mg , calories 30% from fat and 7% or less from saturated fats, daily to have 5 or more servings of fruits and vegetables.  Biometrics:  Pre Biometrics - 06/18/23 1202       Pre Biometrics   Grip Strength 32 kg              Nutrition Therapy Plan and Nutrition Goals:   Nutrition Assessments:  MEDIFICTS Score Key: ?70 Need to make dietary changes  40-70 Heart Healthy Diet ? 40 Therapeutic Level Cholesterol Diet   Picture Your Plate Scores: <16 Unhealthy dietary pattern with much room for improvement. 41-50 Dietary pattern unlikely to meet recommendations for good health and room for improvement. 51-60 More healthful dietary pattern, with some room for  improvement.  >60 Healthy dietary pattern, although there may be some specific behaviors that could be improved.    Nutrition Goals Re-Evaluation:   Nutrition Goals Discharge (Final Nutrition Goals Re-Evaluation):   Psychosocial: Target Goals: Acknowledge presence or absence of significant depression and/or stress, maximize coping skills, provide positive support system. Participant is able to verbalize types and ability to use techniques and skills needed for reducing stress and depression.  Initial Review & Psychosocial Screening:  Initial Psych Review & Screening - 06/18/23 1044       Initial Review   Current issues with None Identified      Family Dynamics   Good Support System? Yes   sister, sons   Comments sister, sons      Barriers   Psychosocial barriers to participate in program There are no identifiable barriers or psychosocial needs.;The patient should benefit from training in stress management and relaxation.      Screening Interventions   Interventions Encouraged to exercise    Expected Outcomes Short Term goal: Utilizing psychosocial counselor, staff and physician to assist with identification of specific Stressors or current issues interfering with healing process. Setting desired goal for each stressor or current issue identified.;Long Term Goal: Stressors or current issues are controlled or eliminated.;Short Term goal: Identification and review with participant of any Quality of Life or Depression concerns found by scoring the questionnaire.;Long Term goal: The participant improves quality of Life and PHQ9 Scores as seen by post scores and/or verbalization of changes             Quality of Life Scores:  Scores of 19 and below usually indicate a poorer quality of life in these areas.  A difference of  2-3 points is a clinically meaningful difference.  A difference of 2-3 points in the total score of the Quality of Life Index has been associated with significant  improvement in overall quality of life, self-image, physical symptoms, and general health in studies assessing change in quality of life.  PHQ-9: Review Flowsheet  More data exists      06/18/2023 07/23/2022 03/24/2022 09/24/2021 03/24/2021  Depression screen PHQ 2/9  Decreased Interest 1 0 0  0 0  Down, Depressed, Hopeless 0 0 0 0 0  PHQ - 2 Score 1 0 0 0 0  Altered sleeping 1 0 0 - 0  Tired, decreased energy 1 2 0 - 0  Change in appetite 0 0 0 - 0  Feeling bad or failure about yourself  0 0 0 - 0  Trouble concentrating 0 0 0 - 0  Moving slowly or fidgety/restless 0 0 0 - 0  Suicidal thoughts 0 0 0 - 0  PHQ-9 Score 3 2 0 - 0  Difficult doing work/chores Somewhat difficult Not difficult at all Not difficult at all - -    Details           Interpretation of Total Score  Total Score Depression Severity:  1-4 = Minimal depression, 5-9 = Mild depression, 10-14 = Moderate depression, 15-19 = Moderately severe depression, 20-27 = Severe depression   Psychosocial Evaluation and Intervention:  Psychosocial Evaluation - 06/18/23 1045       Psychosocial Evaluation & Interventions   Interventions Stress management education;Relaxation education;Encouraged to exercise with the program and follow exercise prescription    Expected Outcomes For pt to participate in exercise and education    Continue Psychosocial Services  No Follow up required             Psychosocial Re-Evaluation:   Psychosocial Discharge (Final Psychosocial Re-Evaluation):   Education: Education Goals: Education classes will be provided on a weekly basis, covering required topics. Participant will state understanding/return demonstration of topics presented.  Learning Barriers/Preferences:  Learning Barriers/Preferences - 06/18/23 1046       Learning Barriers/Preferences   Learning Barriers None    Learning Preferences Written Material             Education Topics: Introduction to Pulmonary  Rehab Group instruction provided by PowerPoint, verbal discussion, and written material to support subject matter. Instructor reviews what Pulmonary Rehab is, the purpose of the program, and how patients are referred.     Know Your Numbers Group instruction that is supported by a PowerPoint presentation. Instructor discusses importance of knowing and understanding resting, exercise, and post-exercise oxygen saturation, heart rate, and blood pressure. Oxygen saturation, heart rate, blood pressure, rating of perceived exertion, and dyspnea are reviewed along with a normal range for these values.    Exercise for the Pulmonary Patient Group instruction that is supported by a PowerPoint presentation. Instructor discusses benefits of exercise, core components of exercise, frequency, duration, and intensity of an exercise routine, importance of utilizing pulse oximetry during exercise, safety while exercising, and options of places to exercise outside of rehab.       MET Level  Group instruction provided by PowerPoint, verbal discussion, and written material to support subject matter. Instructor reviews what METs are and how to increase METs.    Pulmonary Medications Verbally interactive group education provided by instructor with focus on inhaled medications and proper administration.   Anatomy and Physiology of the Respiratory System Group instruction provided by PowerPoint, verbal discussion, and written material to support subject matter. Instructor reviews respiratory cycle and anatomical components of the respiratory system and their functions. Instructor also reviews differences in obstructive and restrictive respiratory diseases with examples of each.    Oxygen Safety Group instruction provided by PowerPoint, verbal discussion, and written material to support subject matter. There is an overview of "What is Oxygen" and "Why do we need it".  Instructor also reviews how to create a safe  environment for oxygen  use, the importance of using oxygen as prescribed, and the risks of noncompliance. There is a brief discussion on traveling with oxygen and resources the patient may utilize.   Oxygen Use Group instruction provided by PowerPoint, verbal discussion, and written material to discuss how supplemental oxygen is prescribed and different types of oxygen supply systems. Resources for more information are provided.    Breathing Techniques Group instruction that is supported by demonstration and informational handouts. Instructor discusses the benefits of pursed lip and diaphragmatic breathing and detailed demonstration on how to perform both.     Risk Factor Reduction Group instruction that is supported by a PowerPoint presentation. Instructor discusses the definition of a risk factor, different risk factors for pulmonary disease, and how the heart and lungs work together.   MD Day A group question and answer session with a medical doctor that allows participants to ask questions that relate to their pulmonary disease state.   Nutrition for the Pulmonary Patient Group instruction provided by PowerPoint slides, verbal discussion, and written materials to support subject matter. The instructor gives an explanation and review of healthy diet recommendations, which includes a discussion on weight management, recommendations for fruit and vegetable consumption, as well as protein, fluid, caffeine, fiber, sodium, sugar, and alcohol. Tips for eating when patients are short of breath are discussed.    Other Education Group or individual verbal, written, or video instructions that support the educational goals of the pulmonary rehab program.    Knowledge Questionnaire Score:  Knowledge Questionnaire Score - 06/18/23 1052       Knowledge Questionnaire Score   Pre Score 14/18             Core Components/Risk Factors/Patient Goals at Admission:  Personal Goals and Risk  Factors at Admission - 06/18/23 1046       Core Components/Risk Factors/Patient Goals on Admission    Weight Management Yes;Weight Loss   pt newly pre-DM. Working on [employer] loss   Intervention Weight Management: Develop a combined nutrition and exercise program designed to reach desired caloric intake, while maintaining appropriate intake of nutrient and fiber, sodium and fats, and appropriate energy expenditure required for the weight goal.;Weight Management: Provide education and appropriate resources to help participant work on and attain dietary goals.;Weight Management/Obesity: Establish reasonable short term and long term weight goals.;Obesity: Provide education and appropriate resources to help participant work on and attain dietary goals.    Admit Weight 212 lb 4.9 oz (96.3 kg)    Expected Outcomes Weight Loss: Understanding of general recommendations for a balanced deficit meal plan, which promotes 1-2 lb weight loss per week and includes a negative energy balance of [phone] kcal/d;Long Term: Adherence to nutrition and physical activity/exercise program aimed toward attainment of established weight goal    Improve shortness of breath with ADL's Yes    Intervention Provide education, individualized exercise plan and daily activity instruction to help decrease symptoms of SOB with activities of daily living.    Expected Outcomes Short Term: Improve cardiorespiratory fitness to achieve a reduction of symptoms when performing ADLs;Long Term: Be able to perform more ADLs without symptoms or delay the onset of symptoms             Core Components/Risk Factors/Patient Goals Review:    Core Components/Risk Factors/Patient Goals at Discharge (Final Review):    ITP Comments:   Comments: Dr. Mechele Collin is Medical Director for Pulmonary Rehab at [facility].

## 2023-06-18 NOTE — Progress Notes (Signed)
Caitlin [name] 57 y.o. female Pulmonary Rehab Orientation Note This patient who was referred to Pulmonary Rehab by Dr. Everardo All with the diagnosis of COPD 2 arrived today in Cardiac and Pulmonary Rehab. She  arrived ambulatory with normal gait. She  does not carry portable oxygen.  Color good, skin warm and dry. Patient is oriented to time and place. Patient's medical history, psychosocial health, and medications reviewed. Psychosocial assessment reveals patient lives  alone. 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 Patient hobbies include watching tv and walking her dog. Patient reports her stress level is low. Areas of stress/anxiety include health and finances. Patient does not exhibit signs of depression.  PHQ2/9 score 2/3. Caitlin [name] shows good  coping skills with positive outlook on life. Offered emotional support and reassurance. Will continue to monitor and evaluate progress toward psychosocial goal(s) of managing stress. Physical assessment performed by Essie Hart RN. Please see their orientation physical assessment note. Caitlin [name] reports she does take medications as prescribed. Patient states she follows a regular  diet. The patient has been trying to gain weight by decreasing sugars. Patient's weight will be monitored closely and she will work with dietician to continue weight loss to control preDM. Demonstration and practice of PLB using pulse oximeter. Caitlin [name] able to return demonstration satisfactorily. Safety and hand hygiene in the exercise area reviewed with patient. [name] voices understanding of the information reviewed. Department expectations discussed with patient and achievable goals were set. The patient shows enthusiasm about attending the program and we look forward to working with Caitlin [name]. Caitlin [name] completed a 6 min walk test today and is scheduled to begin exercise on 06/24/23 at 8:15.   [id number] Caitlin [name], BS, ACSM-CEP

## 2023-06-23 NOTE — Progress Notes (Signed)
Pulmonary Individual Treatment Plan  Patient Details  Name: Caitlin [name] MRN: [id number] Date of Birth: Aug 02, 1966 Referring Provider:   Doristine Devoid Pulmonary Rehab Walk Test from 06/18/2023 in [facility]  Referring Provider Everardo All       Initial Encounter Date:  Flowsheet Row Pulmonary Rehab Walk Test from 06/18/2023 in [facility] for Heart, Vascular, & Lung [facility]  Date 06/18/23       Visit Diagnosis: Stage 2 moderate COPD by GOLD classification (HCC)  Patient's Home Medications on Admission:   Current Outpatient Medications:    albuterol (VENTOLIN HFA) 108 (90 Base) MCG/ACT inhaler, Inhale 2 puffs into the lungs every 4 (four) hours as needed for wheezing or shortness of breath., Disp: 18 g, Rfl: 3   atorvastatin (LIPITOR) 10 MG tablet, Take 1 tablet (10 mg total) by mouth daily., Disp: 90 tablet, Rfl: 1   METAMUCIL FIBER PO, Take 3 tablets by mouth daily., Disp: , Rfl:    Omega-3 Fatty Acids (FISH OIL PO), Take 1 capsule by mouth daily., Disp: , Rfl:    umeclidinium-vilanterol (ANORO ELLIPTA) 62.5-25 MCG/ACT AEPB, Inhale 1 puff into the lungs daily at 6 (six) AM., Disp: 60 each, Rfl: 6   Vitamin D, Ergocalciferol, (DRISDOL) 1.25 MG (50000 UNIT) CAPS capsule, Take 1 capsule (50,000 Units total) by mouth every 7 (seven) days., Disp: 12 capsule, Rfl: 0  Past Medical History: Past Medical History:  Diagnosis Date   CHF (congestive heart failure) (HCC)    COPD (chronic obstructive pulmonary disease) (HCC)    HLD (hyperlipidemia)    tx w/atorvastatin   NSTEMI (non-ST elevated myocardial infarction) (HCC)    pt denies   Takotsubo cardiomyopathy     Tobacco Use: Social History   Tobacco Use  Smoking Status Former   Current packs/day: 0.00   Types: Cigarettes   Quit date: 08/16/2017   Years since quitting: 5.8  Smokeless Tobacco Never  Tobacco Comments   quit 07/12/17, abstaining  8/24    Labs: Review Flowsheet  More data exists      Latest Ref Rng & Units 04/16/2020 03/17/2021 01/06/2022 03/24/2022 04/06/2023  Labs for ITP Cardiac and Pulmonary Rehab  Cholestrol 100 - 199 mg/dL 086  578  - 469  629   LDL (calc) 0 - 99 mg/dL 528  413  - 98  97   HDL-C >39 mg/dL 52  45  - 43  38   Trlycerides 0 - 149 mg/dL 95  73  - 244  010   Hemoglobin A1c 4.8 - 5.6 % - - - - 6.1   PH, Arterial 7.35 - 7.45 - - 7.42  - -  PCO2 arterial 32 - 48 mmHg - - 37  - -  Bicarbonate 20.0 - 28.0 mmol/L - - 24.0  - -  Acid-base deficit 0.0 - 2.0 mmol/L - - 0.2  - -  O2 Saturation % - - 98.9  - -    Details            Capillary Blood Glucose: Lab Results  Component Value Date   GLUCAP 79 12/16/2021   GLUCAP 94 09/28/2017   GLUCAP 95 09/28/2017   GLUCAP 108 (H) 09/27/2017   GLUCAP 89 09/27/2017     Pulmonary Assessment Scores:  Pulmonary Assessment Scores     Row Name 06/18/23 1050         ADL UCSD   ADL Phase Entry  SOB Score total 61       CAT Score   CAT Score 18       mMRC Score   mMRC Score 4             UCSD: Self-administered rating of dyspnea associated with activities of daily living (ADLs) 6-point scale (0 = "not at all" to 5 = "maximal or unable to do because of breathlessness")  Scoring Scores range from 0 to 120.  Minimally important difference is 5 units  CAT: CAT can identify the health impairment of COPD patients and is better correlated with disease progression.  CAT has a scoring range of zero to 40. The CAT score is classified into four groups of low (less than 10), medium (10 - 20), high (21-30) and very high (31-40) based on the impact level of disease on health status. A CAT score over 10 suggests significant symptoms.  A worsening CAT score could be explained by an exacerbation, poor medication adherence, poor inhaler technique, or progression of COPD or comorbid conditions.  CAT MCID is 2 points  mMRC: mMRC (Modified Medical Research  Council) Dyspnea Scale is used to assess the degree of baseline functional disability in patients of respiratory disease due to dyspnea. No minimal important difference is established. A decrease in score of 1 point or greater is considered a positive change.   Pulmonary Function Assessment:  Pulmonary Function Assessment - 06/18/23 1050       Breath   Shortness of Breath Limiting activity             Exercise Target Goals: Exercise Program Goal: Individual exercise prescription set using results from initial 6 min walk test and THRR while considering  patient's activity barriers and safety.   Exercise Prescription Goal: Initial exercise prescription builds to 30-45 minutes a day of aerobic activity, 2-3 days per week.  Home exercise guidelines will be given to patient during program as part of exercise prescription that the participant will acknowledge.  Activity Barriers & Risk Stratification:  Activity Barriers & Cardiac Risk Stratification - 06/18/23 1053       Activity Barriers & Cardiac Risk Stratification   Activity Barriers Shortness of Breath;Muscular Weakness;Deconditioning    Cardiac Risk Stratification Moderate             6 Minute Walk:  6 Minute Walk     Row Name 06/18/23 1152         6 Minute Walk   Phase Initial     Distance 1200 feet     Walk Time 6 minutes     # of Rest Breaks 0     MPH 2.27     METS 3.04     RPE 9     Perceived Dyspnea  1     VO2 Peak 10.65     Symptoms No     Resting HR 70 bpm     Resting BP 122/76     Resting Oxygen Saturation  99 %     Exercise Oxygen Saturation  during 6 min walk 96 %     Max Ex. HR 91 bpm     Max Ex. BP 132/72     2 Minute Post BP 110/72       Interval HR   1 Minute HR 85     2 Minute HR 89     3 Minute HR 91     4 Minute HR 90     5 Minute HR  96     6 Minute HR 91     2 Minute Post HR 60     Interval Heart Rate? Yes       Interval Oxygen   Interval Oxygen? Yes     Baseline Oxygen  Saturation % 99 %     1 Minute Oxygen Saturation % 98 %     1 Minute Liters of Oxygen 0 L     2 Minute Oxygen Saturation % 96 %     2 Minute Liters of Oxygen 0 L     3 Minute Oxygen Saturation % 96 %     3 Minute Liters of Oxygen 0 L     4 Minute Oxygen Saturation % 97 %     4 Minute Liters of Oxygen 0 L     5 Minute Oxygen Saturation % 96 %     5 Minute Liters of Oxygen 0 L     6 Minute Oxygen Saturation % 96 %     6 Minute Liters of Oxygen 0 L     2 Minute Post Oxygen Saturation % 98 %     2 Minute Post Liters of Oxygen 0 L              Oxygen Initial Assessment:  Oxygen Initial Assessment - 06/18/23 1049       Home Oxygen   Home Oxygen Device None    Sleep Oxygen Prescription None    Home Exercise Oxygen Prescription None    Home Resting Oxygen Prescription None    Compliance with Home Oxygen Use Yes      Initial 6 min Walk   Oxygen Used None      Program Oxygen Prescription   Program Oxygen Prescription None      Intervention   Short Term Goals To learn and exhibit compliance with exercise, home and travel O2 prescription;To learn and understand importance of maintaining oxygen saturations>88%;To learn and demonstrate proper use of respiratory medications;To learn and understand importance of monitoring SPO2 with pulse oximeter and demonstrate accurate use of the pulse oximeter.;To learn and demonstrate proper pursed lip breathing techniques or other breathing techniques. ;Other    Long  Term Goals Demonstrates proper use of MDI's;Other;Compliance with respiratory medication;Exhibits proper breathing techniques, such as pursed lip breathing or other method taught during program session;Maintenance of O2 saturations>88%;Verbalizes importance of monitoring SPO2 with pulse oximeter and return demonstration;Exhibits compliance with exercise, home  and travel O2 prescription             Oxygen Re-Evaluation:   Oxygen Discharge (Final Oxygen  Re-Evaluation):   Initial Exercise Prescription:  Initial Exercise Prescription - 06/18/23 1200       Date of Initial Exercise RX and Referring Provider   Date 06/18/23    Referring Provider Everardo All    Expected Discharge Date 09/09/23      Treadmill   MPH 2.2    Grade 0    Minutes 15    METs 3      Bike   Level 1    Minutes 15    METs 3      Prescription Details   Frequency (times per week) 2    Duration Progress to 30 minutes of continuous aerobic without signs/symptoms of physical distress      Intensity   THRR 40-80% of Max Heartrate 64-129    Ratings of Perceived Exertion 11-13    Perceived Dyspnea 0-4      Progression  Progression Continue to progress workloads to maintain intensity without signs/symptoms of physical distress.      Resistance Training   Training Prescription Yes    Weight blue bands    Reps 10-15             Perform Capillary Blood Glucose checks as needed.  Exercise Prescription Changes:   Exercise Comments:   Exercise Goals and Review:   Exercise Goals Re-Evaluation :   Discharge Exercise Prescription (Final Exercise Prescription Changes):   Nutrition:  Target Goals: Understanding of nutrition guidelines, daily intake of sodium 1500mg , cholesterol 200mg , calories 30% from fat and 7% or less from saturated fats, daily to have 5 or more servings of fruits and vegetables.  Biometrics:  Pre Biometrics - 06/18/23 1202       Pre Biometrics   Grip Strength 32 kg              Nutrition Therapy Plan and Nutrition Goals:   Nutrition Assessments:  MEDIFICTS Score Key: ?70 Need to make dietary changes  40-70 Heart Healthy Diet ? 40 Therapeutic Level Cholesterol Diet   Picture Your Plate Scores: <13 Unhealthy dietary pattern with much room for improvement. 41-50 Dietary pattern unlikely to meet recommendations for good health and room for improvement. 51-60 More healthful dietary pattern, with some room for  improvement.  >60 Healthy dietary pattern, although there may be some specific behaviors that could be improved.    Nutrition Goals Re-Evaluation:   Nutrition Goals Discharge (Final Nutrition Goals Re-Evaluation):   Psychosocial: Target Goals: Acknowledge presence or absence of significant depression and/or stress, maximize coping skills, provide positive support system. Participant is able to verbalize types and ability to use techniques and skills needed for reducing stress and depression.  Initial Review & Psychosocial Screening:  Initial Psych Review & Screening - 06/18/23 1044       Initial Review   Current issues with None Identified      Family Dynamics   Good Support System? Yes   sister, sons   Comments sister, sons      Barriers   Psychosocial barriers to participate in program There are no identifiable barriers or psychosocial needs.;The patient should benefit from training in stress management and relaxation.      Screening Interventions   Interventions Encouraged to exercise    Expected Outcomes Short Term goal: Utilizing psychosocial counselor, staff and physician to assist with identification of specific Stressors or current issues interfering with healing process. Setting desired goal for each stressor or current issue identified.;Long Term Goal: Stressors or current issues are controlled or eliminated.;Short Term goal: Identification and review with participant of any Quality of Life or Depression concerns found by scoring the questionnaire.;Long Term goal: The participant improves quality of Life and PHQ9 Scores as seen by post scores and/or verbalization of changes             Quality of Life Scores:  Scores of 19 and below usually indicate a poorer quality of life in these areas.  A difference of  2-3 points is a clinically meaningful difference.  A difference of 2-3 points in the total score of the Quality of Life Index has been associated with significant  improvement in overall quality of life, self-image, physical symptoms, and general health in studies assessing change in quality of life.  PHQ-9: Review Flowsheet  More data exists      06/18/2023 07/23/2022 03/24/2022 09/24/2021 03/24/2021  Depression screen PHQ 2/9  Decreased Interest 1 0 0  0 0  Down, Depressed, Hopeless 0 0 0 0 0  PHQ - 2 Score 1 0 0 0 0  Altered sleeping 1 0 0 - 0  Tired, decreased energy 1 2 0 - 0  Change in appetite 0 0 0 - 0  Feeling bad or failure about yourself  0 0 0 - 0  Trouble concentrating 0 0 0 - 0  Moving slowly or fidgety/restless 0 0 0 - 0  Suicidal thoughts 0 0 0 - 0  PHQ-9 Score 3 2 0 - 0  Difficult doing work/chores Somewhat difficult Not difficult at all Not difficult at all - -    Details           Interpretation of Total Score  Total Score Depression Severity:  1-4 = Minimal depression, 5-9 = Mild depression, 10-14 = Moderate depression, 15-19 = Moderately severe depression, 20-27 = Severe depression   Psychosocial Evaluation and Intervention:  Psychosocial Evaluation - 06/18/23 1045       Psychosocial Evaluation & Interventions   Interventions Stress management education;Relaxation education;Encouraged to exercise with the program and follow exercise prescription    Expected Outcomes For pt to participate in exercise and education    Continue Psychosocial Services  No Follow up required             Psychosocial Re-Evaluation:  Psychosocial Re-Evaluation     Row Name 06/21/23 0919             Psychosocial Re-Evaluation   Current issues with None Identified       Comments No new psychosocial barriers or concerns since orientation on 8/2. Pt is scheduled to start on 8/8.       Expected Outcomes For Shaunita to participate in PR free of any psychosocial barriers or concerns       Interventions Encouraged to attend Pulmonary Rehabilitation for the exercise       Continue Psychosocial Services  No Follow up required                 Psychosocial Discharge (Final Psychosocial Re-Evaluation):  Psychosocial Re-Evaluation - 06/21/23 0919       Psychosocial Re-Evaluation   Current issues with None Identified    Comments No new psychosocial barriers or concerns since orientation on 8/2. Pt is scheduled to start on 8/8.    Expected Outcomes For Dollinda to participate in PR free of any psychosocial barriers or concerns    Interventions Encouraged to attend Pulmonary Rehabilitation for the exercise    Continue Psychosocial Services  No Follow up required             Education: Education Goals: Education classes will be provided on a weekly basis, covering required topics. Participant will state understanding/return demonstration of topics presented.  Learning Barriers/Preferences:  Learning Barriers/Preferences - 06/18/23 1046       Learning Barriers/Preferences   Learning Barriers None    Learning Preferences Written Material             Education Topics: Introduction to Pulmonary Rehab Group instruction provided by PowerPoint, verbal discussion, and written material to support subject matter. Instructor reviews what Pulmonary Rehab is, the purpose of the program, and how patients are referred.     Know Your Numbers Group instruction that is supported by a PowerPoint presentation. Instructor discusses importance of knowing and understanding resting, exercise, and post-exercise oxygen saturation, heart rate, and blood pressure. Oxygen saturation, heart rate, blood pressure, rating of perceived exertion, and dyspnea are  reviewed along with a normal range for these values.    Exercise for the Pulmonary Patient Group instruction that is supported by a PowerPoint presentation. Instructor discusses benefits of exercise, core components of exercise, frequency, duration, and intensity of an exercise routine, importance of utilizing pulse oximetry during exercise, safety while exercising, and options of places to  exercise outside of rehab.       MET Level  Group instruction provided by PowerPoint, verbal discussion, and written material to support subject matter. Instructor reviews what METs are and how to increase METs.    Pulmonary Medications Verbally interactive group education provided by instructor with focus on inhaled medications and proper administration.   Anatomy and Physiology of the Respiratory System Group instruction provided by PowerPoint, verbal discussion, and written material to support subject matter. Instructor reviews respiratory cycle and anatomical components of the respiratory system and their functions. Instructor also reviews differences in obstructive and restrictive respiratory diseases with examples of each.    Oxygen Safety Group instruction provided by PowerPoint, verbal discussion, and written material to support subject matter. There is an overview of "What is Oxygen" and "Why do we need it".  Instructor also reviews how to create a safe environment for oxygen use, the importance of using oxygen as prescribed, and the risks of noncompliance. There is a brief discussion on traveling with oxygen and resources the patient may utilize.   Oxygen Use Group instruction provided by PowerPoint, verbal discussion, and written material to discuss how supplemental oxygen is prescribed and different types of oxygen supply systems. Resources for more information are provided.    Breathing Techniques Group instruction that is supported by demonstration and informational handouts. Instructor discusses the benefits of pursed lip and diaphragmatic breathing and detailed demonstration on how to perform both.     Risk Factor Reduction Group instruction that is supported by a PowerPoint presentation. Instructor discusses the definition of a risk factor, different risk factors for pulmonary disease, and how the heart and lungs work together.   MD Day A group question and answer  session with a medical doctor that allows participants to ask questions that relate to their pulmonary disease state.   Nutrition for the Pulmonary Patient Group instruction provided by PowerPoint slides, verbal discussion, and written materials to support subject matter. The instructor gives an explanation and review of healthy diet recommendations, which includes a discussion on weight management, recommendations for fruit and vegetable consumption, as well as protein, fluid, caffeine, fiber, sodium, sugar, and alcohol. Tips for eating when patients are short of breath are discussed.    Other Education Group or individual verbal, written, or video instructions that support the educational goals of the pulmonary rehab program.    Knowledge Questionnaire Score:  Knowledge Questionnaire Score - 06/18/23 1052       Knowledge Questionnaire Score   Pre Score 14/18             Core Components/Risk Factors/Patient Goals at Admission:  Personal Goals and Risk Factors at Admission - 06/18/23 1046       Core Components/Risk Factors/Patient Goals on Admission    Weight Management Yes;Weight Loss   pt newly pre-DM. Working on [employer] loss   Intervention Weight Management: Develop a combined nutrition and exercise program designed to reach desired caloric intake, while maintaining appropriate intake of nutrient and fiber, sodium and fats, and appropriate energy expenditure required for the weight goal.;Weight Management: Provide education and appropriate resources to help participant work on and attain dietary  goals.;Weight Management/Obesity: Establish reasonable short term and long term weight goals.;Obesity: Provide education and appropriate resources to help participant work on and attain dietary goals.    Admit Weight 212 lb 4.9 oz (96.3 kg)    Expected Outcomes Weight Loss: Understanding of general recommendations for a balanced deficit meal plan, which promotes 1-2 lb weight loss per week  and includes a negative energy balance of [phone] kcal/d;Long Term: Adherence to nutrition and physical activity/exercise program aimed toward attainment of established weight goal    Improve shortness of breath with ADL's Yes    Intervention Provide education, individualized exercise plan and daily activity instruction to help decrease symptoms of SOB with activities of daily living.    Expected Outcomes Short Term: Improve cardiorespiratory fitness to achieve a reduction of symptoms when performing ADLs;Long Term: Be able to perform more ADLs without symptoms or delay the onset of symptoms             Core Components/Risk Factors/Patient Goals Review:   Goals and Risk Factor Review     Row Name 06/21/23 0921             Core Components/Risk Factors/Patient Goals Review   Personal Goals Review Weight Management/Obesity;Diabetes;Improve shortness of breath with ADL's;Develop more efficient breathing techniques such as purse lipped breathing and diaphragmatic breathing and practicing self-pacing with activity.       Review Pt is scheduled to start on 8/8. We will continue to monitor her progress and goals throughout the program.       Expected Outcomes See admission goals                Core Components/Risk Factors/Patient Goals at Discharge (Final Review):   Goals and Risk Factor Review - 06/21/23 0921       Core Components/Risk Factors/Patient Goals Review   Personal Goals Review Weight Management/Obesity;Diabetes;Improve shortness of breath with ADL's;Develop more efficient breathing techniques such as purse lipped breathing and diaphragmatic breathing and practicing self-pacing with activity.    Review Pt is scheduled to start on 8/8. We will continue to monitor her progress and goals throughout the program.    Expected Outcomes See admission goals             ITP Comments: Pt is expected to make progress toward Pulmonary Rehab goals after completing 0 sessions.  Recommend continued exercise, life style modification, education, and utilization of breathing techniques to increase stamina and strength, while also decreasing shortness of breath with exertion.     Comments: Dr. Mechele Collin is Medical Director for Pulmonary Rehab at [facility].

## 2023-06-24 ENCOUNTER — Encounter (HOSPITAL_COMMUNITY): Payer: Commercial Managed Care - HMO

## 2023-06-29 ENCOUNTER — Encounter (HOSPITAL_COMMUNITY)
Admission: RE | Admit: 2023-06-29 | Discharge: 2023-06-29 | Disposition: A | Discharge status: Home / Self Care | Payer: Commercial Managed Care - HMO | Source: Ambulatory Visit | Attending: Pulmonary Disease | Admitting: Pulmonary Disease

## 2023-06-29 VITALS — Wt 214.1 lb

## 2023-06-29 DIAGNOSIS — J449 Chronic obstructive pulmonary disease, unspecified: Secondary | ICD-10-CM

## 2023-06-29 NOTE — Progress Notes (Signed)
Daily Session Note  Patient Details  Name: Caitlin [name] MRN: [id number] Date of Birth: 12-09-1965 Referring Provider:   Doristine Devoid Pulmonary Rehab Walk Test from 06/18/2023 in [facility], [facility]  Referring Provider Everardo All       Encounter Date: 06/29/2023  Check In:  Session Check In - 06/29/23 0834       Check-In   Supervising physician immediately available to respond to emergencies [facility] MD immediately available    Physician(s) Jari Favre, PA    Location [facility]    Staff Present Raford Pitcher, MS, ACSM-CEP, Exercise Physiologist;Randi Dionisio Paschal, ACSM-CEP, Exercise Physiologist;Mary Gerre Scull, RN, Fuller Plan, RT    Virtual Visit No    Medication changes reported     No    Fall or balance concerns reported    No    Tobacco Cessation No Change    Warm-up and Cool-down Performed as group-led instruction    Resistance Training Performed Yes    VAD Patient? No    PAD/SET Patient? No      Pain Assessment   Currently in Pain? No/denies    Multiple Pain Sites No             Capillary Blood Glucose: No results found for this or any previous visit (from the past 24 hour(s)).    Social History   Tobacco Use  Smoking Status Former   Current packs/day: 0.00   Types: Cigarettes   Quit date: 08/16/2017   Years since quitting: 5.8  Smokeless Tobacco Never  Tobacco Comments   quit 07/12/17, abstaining 8/24    Goals Met:  Proper associated with RPD/PD & O2 Sat Independence with exercise equipment Exercise tolerated well No report of concerns or symptoms today Strength training completed today  Goals Unmet:  Not Applicable  Comments: Service time is from 0812 to [phone].    Dr. Mechele Collin is Medical Director for Pulmonary Rehab at [facility].

## 2023-07-01 ENCOUNTER — Other Ambulatory Visit: Payer: Self-pay | Admitting: Family Medicine

## 2023-07-01 ENCOUNTER — Encounter (HOSPITAL_BASED_OUTPATIENT_CLINIC_OR_DEPARTMENT_OTHER): Payer: Self-pay | Admitting: Emergency Medicine

## 2023-07-01 ENCOUNTER — Emergency Department (HOSPITAL_BASED_OUTPATIENT_CLINIC_OR_DEPARTMENT_OTHER)
Admission: EM | Admit: 2023-07-01 | Discharge: 2023-07-01 | Disposition: A | Discharge status: Home / Self Care | Payer: Commercial Managed Care - HMO | Attending: Emergency Medicine | Admitting: Emergency Medicine

## 2023-07-01 ENCOUNTER — Other Ambulatory Visit: Payer: Self-pay

## 2023-07-01 ENCOUNTER — Telehealth (HOSPITAL_COMMUNITY): Payer: Self-pay | Admitting: *Deleted

## 2023-07-01 ENCOUNTER — Encounter (HOSPITAL_COMMUNITY): Payer: Commercial Managed Care - HMO

## 2023-07-01 DIAGNOSIS — J449 Chronic obstructive pulmonary disease, unspecified: Secondary | ICD-10-CM | POA: Diagnosis not present

## 2023-07-01 DIAGNOSIS — I509 Heart failure, unspecified: Secondary | ICD-10-CM | POA: Diagnosis not present

## 2023-07-01 DIAGNOSIS — Z85118 Personal history of other malignant neoplasm of bronchus and lung: Secondary | ICD-10-CM | POA: Diagnosis not present

## 2023-07-01 DIAGNOSIS — I2510 Atherosclerotic heart disease of native coronary artery without angina pectoris: Secondary | ICD-10-CM | POA: Diagnosis not present

## 2023-07-01 DIAGNOSIS — U071 COVID-19: Secondary | ICD-10-CM | POA: Insufficient documentation

## 2023-07-01 DIAGNOSIS — J029 Acute pharyngitis, unspecified: Secondary | ICD-10-CM | POA: Diagnosis present

## 2023-07-01 LAB — RESP PANEL BY RT-PCR (RSV, FLU A&B, COVID)  RVPGX2
Influenza A by PCR: NEGATIVE
Influenza B by PCR: NEGATIVE
Resp Syncytial Virus by PCR: NEGATIVE
SARS Coronavirus 2 by RT PCR: POSITIVE — AB

## 2023-07-01 LAB — GROUP A STREP BY PCR: Group A Strep by PCR: NOT DETECTED

## 2023-07-01 MED ORDER — PAXLOVID (300/100) 20 X 150 MG & 10 X 100MG PO TBPK: 3.0000 | ORAL_TABLET | Freq: Two times a day (BID) | ORAL | 0 refills | Status: AC

## 2023-07-01 NOTE — Telephone Encounter (Signed)
Message left on departmental voicemail.  Pt will be absent today for Pulmonary Rehab.  Pt up all night in the ER and tested positive for Covid.  Will remain out until cleared to return.  Notified Pulmonary rehab staff. Alanson Aly, BSN Cardiac and Emergency planning/management officer

## 2023-07-01 NOTE — Discharge Instructions (Signed)
Drink plenty of fluids.  Take ibuprofen and/or acetaminophen as needed for fever or aching.  Return to the emergency department if you are having significant difficulty breathing.

## 2023-07-01 NOTE — ED Notes (Signed)
Discharge paperwork given and verbally understood. 

## 2023-07-01 NOTE — ED Provider Notes (Signed)
[facility] AT [facility] Provider Note   CSN: [id number] Arrival date & time: 07/01/23  3086     History  Chief Complaint  Patient presents with   Headache   Sore Throat    Caitlin [name] is a 57 y.o. female.  The history is provided by the patient.  Headache Sore Throat Associated symptoms include headaches.  She has history of COPD, lung cancer, hyperlipidemia, coronary artery disease, heart failure and comes in with 3-day history of headache, chills, body aches, sore throat.  She had a subjective fever yesterday.  She denies any sweats.  There has been minimal cough which is nonproductive.  There has been no vomiting or diarrhea.  She denies any sick contacts.   Home Medications Prior to Admission medications   Medication Sig Start Date End Date Taking? Authorizing Provider  albuterol (VENTOLIN HFA) 108 (90 Base) MCG/ACT inhaler Inhale 2 puffs into the lungs every 4 (four) hours as needed for wheezing or shortness of breath. 09/24/21   Storm Frisk, MD  atorvastatin (LIPITOR) 10 MG tablet Take 1 tablet (10 mg total) by mouth daily. 05/26/23   [name], MD  METAMUCIL FIBER PO Take 3 tablets by mouth daily.    [provider]  Omega-3 Fatty Acids (FISH OIL PO) Take 1 capsule by mouth daily.    [provider]  umeclidinium-vilanterol (ANORO ELLIPTA) 62.5-25 MCG/ACT AEPB Inhale 1 puff into the lungs daily at 6 (six) AM. 05/14/23   Luciano Cutter, MD  Vitamin D, Ergocalciferol, (DRISDOL) 1.25 MG (50000 UNIT) CAPS capsule Take 1 capsule (50,000 Units total) by mouth every 7 (seven) days. 04/08/23   [name], MD      Allergies    Aspirin and Tramadol    Review of Systems   Review of Systems  Neurological:  Positive for headaches.  All other systems reviewed and are negative.   Physical Exam Updated Vital Signs BP 134/86 (BP Location: Left Arm)   Pulse 80   Temp 99.9 F (37.7 C) (Oral)   Resp 18   Wt  96.3 kg   LMP 09/09/2014 (Approximate)   SpO2 97%   BMI 30.90 kg/m  Physical Exam Vitals and nursing note reviewed.   57 year old female, resting comfortably and in no acute distress. Vital signs are normal. Oxygen saturation is 97%, which is normal. Head is normocephalic and atraumatic. PERRLA, EOMI. Oropharynx is mildly erythematous.  There is noes significant tonsillar hypertrophy and no exudate Neck is nontender and supple without adenopathy. Lungs are clear without rales, wheezes, or rhonchi. Chest is nontender. Heart has regular rate and rhythm without murmur. Abdomen is soft, flat, nontender. Extremities have no cyanosis or edema, full range of motion is present. Skin is warm and dry without rash. Neurologic: Mental status is normal, cranial nerves are intact, moves all extremities equally.  ED Results / Procedures / Treatments   Labs (all labs ordered are listed, but only abnormal results are displayed) Labs Reviewed  RESP PANEL BY RT-PCR (RSV, FLU A&B, COVID)  RVPGX2 - Abnormal; Notable for the following components:      Result Value   SARS Coronavirus 2 by RT PCR POSITIVE (*)    All other components within normal limits  GROUP A STREP BY PCR   Procedures Procedures    Medications Ordered in ED Medications - No data to display  ED Course/ Medical Decision Making/ A&P  Medical Decision Making Risk Prescription drug management.   Influenza-like illness.  I have ordered a strep PCR test and respiratory pathogen panel to look for COVID, influenza, RSV.    I have reviewed and interpreted her laboratory tests, and my interpretation is positive PCR for COVID-19, negative for influenza, RSV, strep.  Patient does have significant risk factors for severe disease, therefore I am ordering a prescription of Paxlovid for her.  Final Clinical Impression(s) / ED Diagnoses Final diagnoses:  COVID-19 virus infection    Rx / DC Orders ED  Discharge Orders          Ordered    nirmatrelvir & ritonavir (PAXLOVID, 300/100,) 20 x 150 MG & 10 x 100MG  TBPK  2 times daily        07/01/23 0704              Dione Booze, MD 07/01/23 [phone]

## 2023-07-01 NOTE — ED Triage Notes (Signed)
Pt in with HA, body aches and sore throat x 3 days. Pt states she has a mild cough now as well. Temp 99.9

## 2023-07-06 ENCOUNTER — Encounter (HOSPITAL_COMMUNITY): Payer: Self-pay

## 2023-07-06 ENCOUNTER — Encounter (HOSPITAL_COMMUNITY): Payer: Commercial Managed Care - HMO

## 2023-07-08 ENCOUNTER — Encounter (HOSPITAL_COMMUNITY): Payer: Commercial Managed Care - HMO

## 2023-07-13 ENCOUNTER — Encounter (HOSPITAL_COMMUNITY): Payer: Commercial Managed Care - HMO

## 2023-07-15 ENCOUNTER — Encounter (HOSPITAL_COMMUNITY): Payer: Commercial Managed Care - HMO

## 2023-07-20 ENCOUNTER — Encounter (HOSPITAL_COMMUNITY)
Admission: RE | Admit: 2023-07-20 | Discharge: 2023-07-20 | Disposition: A | Discharge status: Home / Self Care | Payer: Managed Care, Other (non HMO) | Source: Ambulatory Visit | Attending: Pulmonary Disease | Admitting: Pulmonary Disease

## 2023-07-20 VITALS — Wt 209.7 lb

## 2023-07-20 DIAGNOSIS — Z87891 Personal history of nicotine dependence: Secondary | ICD-10-CM | POA: Diagnosis not present

## 2023-07-20 DIAGNOSIS — Z5189 Encounter for other specified aftercare: Secondary | ICD-10-CM | POA: Insufficient documentation

## 2023-07-20 DIAGNOSIS — J449 Chronic obstructive pulmonary disease, unspecified: Secondary | ICD-10-CM | POA: Insufficient documentation

## 2023-07-20 NOTE — Progress Notes (Signed)
Daily Session Note  Patient Details  Name: Caitlin [name] MRN: [id number] Date of Birth: 1966/06/29 Referring Provider:   Doristine Devoid Pulmonary Rehab Walk Test from 06/18/2023 in [facility]  Referring Provider Everardo All       Encounter Date: 07/20/2023  Check In:  Session Check In - 07/20/23 1308       Check-In   Supervising physician immediately available to respond to emergencies [facility] MD immediately available    Physician(s) Bernadene Person, NP    Location [facility]    Staff Present Essie Hart, RN, BSN;Randi Idelle Crouch BS, ACSM-CEP, Exercise Physiologist;Kaylee Earlene Plater, MS, ACSM-CEP, Exercise Physiologist;Eloise Mula Katrinka Blazing, RT    Virtual Visit No    Medication changes reported     No    Fall or balance concerns reported    No    Tobacco Cessation No Change    Warm-up and Cool-down Performed as group-led instruction    Resistance Training Performed Yes    VAD Patient? No    PAD/SET Patient? No      Pain Assessment   Currently in Pain? No/denies    Multiple Pain Sites No             Capillary Blood Glucose: No results found for this or any previous visit (from the past 24 hour(s)).   Exercise Prescription Changes - 07/20/23 0900       Response to Exercise   Blood Pressure (Admit) 102/70    Blood Pressure (Exercise) 124/80    Blood Pressure (Exit) 116/72    Heart Rate (Admit) 78 bpm    Heart Rate (Exercise) 89 bpm    Heart Rate (Exit) 71 bpm    Oxygen Saturation (Admit) 99 %    Oxygen Saturation (Exercise) 98 %    Oxygen Saturation (Exit) 99 %    Rating of Perceived Exertion (Exercise) 15    Perceived Dyspnea (Exercise) 2    Duration Continue with 30 min of aerobic exercise without signs/symptoms of physical distress.    Intensity THRR unchanged      Progression   Progression Continue to progress workloads to maintain intensity without signs/symptoms of physical distress.      Resistance Training    Training Prescription Yes    Weight blue bands    Reps 10-15    Time 10 Minutes      Treadmill   MPH 2    Grade 0    Minutes 15    METs 2.53      Bike   Level 3    Minutes 15    METs 2.9             Social History   Tobacco Use  Smoking Status Former   Current packs/day: 0.00   Types: Cigarettes   Quit date: 08/16/2017   Years since quitting: 5.9  Smokeless Tobacco Never  Tobacco Comments   quit 07/12/17, abstaining 8/24    Goals Met:  Proper associated with RPD/PD & O2 Sat Independence with exercise equipment Exercise tolerated well No report of concerns or symptoms today Strength training completed today  Goals Unmet:  Not Applicable  Comments: Service time is from 0811 to 0933.    Dr. Mechele Collin is Medical Director for Pulmonary Rehab at [facility].

## 2023-07-21 NOTE — Progress Notes (Signed)
Pulmonary Individual Treatment Plan  Patient Details  Name: Caitlin [name] MRN: [id number] Date of Birth: 25-Dec-1965 Referring Provider:   Doristine Devoid Pulmonary Rehab Walk Test from 06/18/2023 in [facility]  Referring Provider Everardo All       Initial Encounter Date:  Flowsheet Row Pulmonary Rehab Walk Test from 06/18/2023 in [facility]  Date 06/18/23       Visit Diagnosis: Stage 2 moderate COPD by GOLD classification (HCC)  Patient's Home Medications on Admission:   Current Outpatient Medications:    albuterol (VENTOLIN HFA) 108 (90 Base) MCG/ACT inhaler, Inhale 2 puffs into the lungs every 4 (four) hours as needed for wheezing or shortness of breath., Disp: 18 g, Rfl: 3   atorvastatin (LIPITOR) 10 MG tablet, Take 1 tablet (10 mg total) by mouth daily., Disp: 90 tablet, Rfl: 1   METAMUCIL FIBER PO, Take 3 tablets by mouth daily., Disp: , Rfl:    Omega-3 Fatty Acids (FISH OIL PO), Take 1 capsule by mouth daily., Disp: , Rfl:    umeclidinium-vilanterol (ANORO ELLIPTA) 62.5-25 MCG/ACT AEPB, Inhale 1 puff into the lungs daily at 6 (six) AM., Disp: 60 each, Rfl: 6   Vitamin D, Ergocalciferol, (DRISDOL) 1.25 MG (50000 UNIT) CAPS capsule, Take 1 capsule (50,000 Units total) by mouth every 7 (seven) days., Disp: 12 capsule, Rfl: 0  Past Medical History: Past Medical History:  Diagnosis Date   CHF (congestive heart failure) (HCC)    COPD (chronic obstructive pulmonary disease) (HCC)    HLD (hyperlipidemia)    tx w/atorvastatin   NSTEMI (non-ST elevated myocardial infarction) (HCC)    pt denies   Takotsubo cardiomyopathy     Tobacco Use: Social History   Tobacco Use  Smoking Status Former   Current packs/day: 0.00   Types: Cigarettes   Quit date: 08/16/2017   Years since quitting: 5.9  Smokeless Tobacco Never  Tobacco Comments   quit 07/12/17, abstaining 8/24     Labs: Review Flowsheet  More data exists      Latest Ref Rng & Units 04/16/2020 03/17/2021 01/06/2022 03/24/2022 04/06/2023  Labs for ITP Cardiac and Pulmonary Rehab  Cholestrol 100 - 199 mg/dL 782  956  - 213  086   LDL (calc) 0 - 99 mg/dL 578  469  - 98  97   HDL-C >39 mg/dL 52  45  - 43  38   Trlycerides 0 - 149 mg/dL 95  73  - 629  528   Hemoglobin A1c 4.8 - 5.6 % - - - - 6.1   PH, Arterial 7.35 - 7.45 - - 7.42  - -  PCO2 arterial 32 - 48 mmHg - - 37  - -  Bicarbonate 20.0 - 28.0 mmol/L - - 24.0  - -  Acid-base deficit 0.0 - 2.0 mmol/L - - 0.2  - -  O2 Saturation % - - 98.9  - -    Details            Capillary Blood Glucose: Lab Results  Component Value Date   GLUCAP 79 12/16/2021   GLUCAP 94 09/28/2017   GLUCAP 95 09/28/2017   GLUCAP 108 (H) 09/27/2017   GLUCAP 89 09/27/2017     Pulmonary Assessment Scores:  Pulmonary Assessment Scores     Row Name 06/18/23 1050         ADL UCSD   ADL Phase Entry  SOB Score total 61       CAT Score   CAT Score 18       mMRC Score   mMRC Score 4             UCSD: Self-administered rating of dyspnea associated with activities of daily living (ADLs) 6-point scale (0 = "not at all" to 5 = "maximal or unable to do because of breathlessness")  Scoring Scores range from 0 to 120.  Minimally important difference is 5 units  CAT: CAT can identify the health impairment of COPD patients and is better correlated with disease progression.  CAT has a scoring range of zero to 40. The CAT score is classified into four groups of low (less than 10), medium (10 - 20), high (21-30) and very high (31-40) based on the impact level of disease on health status. A CAT score over 10 suggests significant symptoms.  A worsening CAT score could be explained by an exacerbation, poor medication adherence, poor inhaler technique, or progression of COPD or comorbid conditions.  CAT MCID is 2 points  mMRC: mMRC (Modified Medical Research  Council) Dyspnea Scale is used to assess the degree of baseline functional disability in patients of respiratory disease due to dyspnea. No minimal important difference is established. A decrease in score of 1 point or greater is considered a positive change.   Pulmonary Function Assessment:  Pulmonary Function Assessment - 06/18/23 1050       Breath   Shortness of Breath Limiting activity             Exercise Target Goals: Exercise Program Goal: Individual exercise prescription set using results from initial 6 min walk test and THRR while considering  patient's activity barriers and safety.   Exercise Prescription Goal: Initial exercise prescription builds to 30-45 minutes a day of aerobic activity, 2-3 days per week.  Home exercise guidelines will be given to patient during program as part of exercise prescription that the participant will acknowledge.  Activity Barriers & Risk Stratification:  Activity Barriers & Cardiac Risk Stratification - 06/18/23 1053       Activity Barriers & Cardiac Risk Stratification   Activity Barriers Shortness of Breath;Muscular Weakness;Deconditioning    Cardiac Risk Stratification Moderate             6 Minute Walk:  6 Minute Walk     Row Name 06/18/23 1152         6 Minute Walk   Phase Initial     Distance 1200 feet     Walk Time 6 minutes     # of Rest Breaks 0     MPH 2.27     METS 3.04     RPE 9     Perceived Dyspnea  1     VO2 Peak 10.65     Symptoms No     Resting HR 70 bpm     Resting BP 122/76     Resting Oxygen Saturation  99 %     Exercise Oxygen Saturation  during 6 min walk 96 %     Max Ex. HR 91 bpm     Max Ex. BP 132/72     2 Minute Post BP 110/72       Interval HR   1 Minute HR 85     2 Minute HR 89     3 Minute HR 91     4 Minute HR 90     5 Minute HR  96     6 Minute HR 91     2 Minute Post HR 60     Interval Heart Rate? Yes       Interval Oxygen   Interval Oxygen? Yes     Baseline Oxygen  Saturation % 99 %     1 Minute Oxygen Saturation % 98 %     1 Minute Liters of Oxygen 0 L     2 Minute Oxygen Saturation % 96 %     2 Minute Liters of Oxygen 0 L     3 Minute Oxygen Saturation % 96 %     3 Minute Liters of Oxygen 0 L     4 Minute Oxygen Saturation % 97 %     4 Minute Liters of Oxygen 0 L     5 Minute Oxygen Saturation % 96 %     5 Minute Liters of Oxygen 0 L     6 Minute Oxygen Saturation % 96 %     6 Minute Liters of Oxygen 0 L     2 Minute Post Oxygen Saturation % 98 %     2 Minute Post Liters of Oxygen 0 L              Oxygen Initial Assessment:  Oxygen Initial Assessment - 06/18/23 1049       Home Oxygen   Home Oxygen Device None    Sleep Oxygen Prescription None    Home Exercise Oxygen Prescription None    Home Resting Oxygen Prescription None    Compliance with Home Oxygen Use Yes      Initial 6 min Walk   Oxygen Used None      Program Oxygen Prescription   Program Oxygen Prescription None      Intervention   Short Term Goals To learn and exhibit compliance with exercise, home and travel O2 prescription;To learn and understand importance of maintaining oxygen saturations>88%;To learn and demonstrate proper use of respiratory medications;To learn and understand importance of monitoring SPO2 with pulse oximeter and demonstrate accurate use of the pulse oximeter.;To learn and demonstrate proper pursed lip breathing techniques or other breathing techniques. ;Other    Long  Term Goals Demonstrates proper use of MDI's;Other;Compliance with respiratory medication;Exhibits proper breathing techniques, such as pursed lip breathing or other method taught during program session;Maintenance of O2 saturations>88%;Verbalizes importance of monitoring SPO2 with pulse oximeter and return demonstration;Exhibits compliance with exercise, home  and travel O2 prescription             Oxygen Re-Evaluation:  Oxygen Re-Evaluation     Row Name 07/13/23 1543              Program Oxygen Prescription   Program Oxygen Prescription None         Home Oxygen   Home Oxygen Device None       Sleep Oxygen Prescription None       Home Exercise Oxygen Prescription None       Home Resting Oxygen Prescription None       Compliance with Home Oxygen Use Yes         Goals/Expected Outcomes   Short Term Goals To learn and exhibit compliance with exercise, home and travel O2 prescription;To learn and understand importance of maintaining oxygen saturations>88%;To learn and demonstrate proper use of respiratory medications;To learn and understand importance of monitoring SPO2 with pulse oximeter and demonstrate accurate use of the pulse oximeter.;To learn and demonstrate proper pursed lip breathing  techniques or other breathing techniques. ;Other       Long  Term Goals Demonstrates proper use of MDI's;Other;Compliance with respiratory medication;Exhibits proper breathing techniques, such as pursed lip breathing or other method taught during program session;Maintenance of O2 saturations>88%;Verbalizes importance of monitoring SPO2 with pulse oximeter and return demonstration;Exhibits compliance with exercise, home  and travel O2 prescription       Goals/Expected Outcomes Compliance and understanding of oxygen saturation monitoring and breathing techniques to decrease shortness of breath.                Oxygen Discharge (Final Oxygen Re-Evaluation):  Oxygen Re-Evaluation - 07/13/23 1543       Program Oxygen Prescription   Program Oxygen Prescription None      Home Oxygen   Home Oxygen Device None    Sleep Oxygen Prescription None    Home Exercise Oxygen Prescription None    Home Resting Oxygen Prescription None    Compliance with Home Oxygen Use Yes      Goals/Expected Outcomes   Short Term Goals To learn and exhibit compliance with exercise, home and travel O2 prescription;To learn and understand importance of maintaining oxygen saturations>88%;To learn and  demonstrate proper use of respiratory medications;To learn and understand importance of monitoring SPO2 with pulse oximeter and demonstrate accurate use of the pulse oximeter.;To learn and demonstrate proper pursed lip breathing techniques or other breathing techniques. ;Other    Long  Term Goals Demonstrates proper use of MDI's;Other;Compliance with respiratory medication;Exhibits proper breathing techniques, such as pursed lip breathing or other method taught during program session;Maintenance of O2 saturations>88%;Verbalizes importance of monitoring SPO2 with pulse oximeter and return demonstration;Exhibits compliance with exercise, home  and travel O2 prescription    Goals/Expected Outcomes Compliance and understanding of oxygen saturation monitoring and breathing techniques to decrease shortness of breath.             Initial Exercise Prescription:  Initial Exercise Prescription - 06/18/23 1200       Date of Initial Exercise RX and Referring Provider   Date 06/18/23    Referring Provider Everardo All    Expected Discharge Date 09/09/23      Treadmill   MPH 2.2    Grade 0    Minutes 15    METs 3      Bike   Level 1    Minutes 15    METs 3      Prescription Details   Frequency (times per week) 2    Duration Progress to 30 minutes of continuous aerobic without signs/symptoms of physical distress      Intensity   THRR 40-80% of Max Heartrate 64-129    Ratings of Perceived Exertion 11-13    Perceived Dyspnea 0-4      Progression   Progression Continue to progress workloads to maintain intensity without signs/symptoms of physical distress.      Resistance Training   Training Prescription Yes    Weight blue bands    Reps 10-15             Perform Capillary Blood Glucose checks as needed.  Exercise Prescription Changes:   Exercise Prescription Changes     Row Name 06/29/23 0959 07/20/23 0900           Response to Exercise   Blood Pressure (Admit) 120/66 102/70       Blood Pressure (Exercise) 138/80 124/80      Blood Pressure (Exit) 134/86 116/72      Heart Rate (  Admit) 65 bpm 78 bpm      Heart Rate (Exercise) 103 bpm 89 bpm      Heart Rate (Exit) 71 bpm 71 bpm      Oxygen Saturation (Admit) 99 % 99 %      Oxygen Saturation (Exercise) 96 % 98 %      Oxygen Saturation (Exit) 99 % 99 %      Rating of Perceived Exertion (Exercise) 13 15      Perceived Dyspnea (Exercise) 2 2      Duration Progress to 30 minutes of  aerobic without signs/symptoms of physical distress Continue with 30 min of aerobic exercise without signs/symptoms of physical distress.      Intensity THRR unchanged THRR unchanged        Progression   Progression Continue to progress workloads to maintain intensity without signs/symptoms of physical distress. Continue to progress workloads to maintain intensity without signs/symptoms of physical distress.        Resistance Training   Training Prescription Yes Yes      Weight blue bands blue bands      Reps 10-15 10-15      Time 10 Minutes 10 Minutes        Treadmill   MPH 2 2      Grade 0 0      Minutes 15 15      METs 2.53 2.53        Bike   Level 1 3      Minutes 15 15      METs 2.3 2.9               Exercise Comments:   Exercise Comments     Row Name 06/29/23 4098           Exercise Comments Taline completed her first day of exercise. She exercises for 15 min on the upright bike and treadmill. Dura averaged 2.3 METs at level 1 on the upright bike and 2.53 METs at 2.0 mph and 0 incline on the treadmill. She performed the warmup and cooldown standing without limitations. Discussed METs.                Exercise Goals and Review:   Exercise Goals     Row Name 07/13/23 1530             Exercise Goals   Increase Physical Activity Yes       Intervention Provide advice, education, support and counseling about physical activity/exercise needs.;Develop an individualized exercise prescription for  aerobic and resistive training based on initial evaluation findings, risk stratification, comorbidities and participant's personal goals.       Expected Outcomes Short Term: Attend rehab on a regular basis to increase amount of physical activity.;Long Term: Exercising regularly at least 3-5 days a week.;Long Term: Add in home exercise to make exercise part of routine and to increase amount of physical activity.       Increase Strength and Stamina Yes       Intervention Provide advice, education, support and counseling about physical activity/exercise needs.;Develop an individualized exercise prescription for aerobic and resistive training based on initial evaluation findings, risk stratification, comorbidities and participant's personal goals.       Expected Outcomes Short Term: Increase workloads from initial exercise prescription for resistance, speed, and METs.;Short Term: Perform resistance training exercises routinely during rehab and add in resistance training at home;Long Term: Improve cardiorespiratory fitness, muscular endurance and strength as measured by increased METs  and functional capacity ( )       Able to understand and use rate of perceived exertion (RPE) scale Yes       Intervention Provide education and explanation on how to use RPE scale       Expected Outcomes Short Term: Able to use RPE daily in rehab to express subjective intensity level;Long Term:  Able to use RPE to guide intensity level when exercising independently       Able to understand and use Dyspnea scale Yes       Intervention Provide education and explanation on how to use Dyspnea scale       Expected Outcomes Short Term: Able to use Dyspnea scale daily in rehab to express subjective sense of shortness of breath during exertion;Long Term: Able to use Dyspnea scale to guide intensity level when exercising independently       Knowledge and understanding of Target Heart Rate Range (THRR) Yes       Intervention Provide  education and explanation of THRR including how the numbers were predicted and where they are located for reference       Expected Outcomes Short Term: Able to state/look up THRR;Long Term: Able to use THRR to govern intensity when exercising independently;Short Term: Able to use daily as guideline for intensity in rehab       Understanding of Exercise Prescription Yes       Intervention Provide education, explanation, and written materials on patient's individual exercise prescription       Expected Outcomes Short Term: Able to explain program exercise prescription;Long Term: Able to explain home exercise prescription to exercise independently                Exercise Goals Re-Evaluation :  Exercise Goals Re-Evaluation     Row Name 07/13/23 1530             Exercise Goal Re-Evaluation   Exercise Goals Review Increase Physical Activity;Able to understand and use Dyspnea scale;Understanding of Exercise Prescription;Increase Strength and Stamina;Knowledge and understanding of Target Heart Rate Range (THRR);Able to understand and use rate of perceived exertion (RPE) scale       Comments Naveena has completed 1 exercise session. She exercises for 15 min on the upright bike and treadmill. Falisa averages 2.3 METs at level 1 on the upright bike and 2.54 METs at 2 mph on the treadmill. Scotty performs the warmup and cooldown standing without limitations. Cherese has missed the last few sessions due to illness. She plans to return next week. Will continue to monitor and progress as able.       Expected Outcomes Through exercise at rehab and home, the patient will decrease shortness of breath with daily activities and feel confident in carrying out an exercise regimen at home.                Discharge Exercise Prescription (Final Exercise Prescription Changes):  Exercise Prescription Changes - 07/20/23 0900       Response to Exercise   Blood Pressure (Admit) 102/70    Blood Pressure  (Exercise) 124/80    Blood Pressure (Exit) 116/72    Heart Rate (Admit) 78 bpm    Heart Rate (Exercise) 89 bpm    Heart Rate (Exit) 71 bpm    Oxygen Saturation (Admit) 99 %    Oxygen Saturation (Exercise) 98 %    Oxygen Saturation (Exit) 99 %    Rating of Perceived Exertion (Exercise) 15    Perceived Dyspnea (Exercise) 2  Duration Continue with 30 min of aerobic exercise without signs/symptoms of physical distress.    Intensity THRR unchanged      Progression   Progression Continue to progress workloads to maintain intensity without signs/symptoms of physical distress.      Resistance Training   Training Prescription Yes    Weight blue bands    Reps 10-15    Time 10 Minutes      Treadmill   MPH 2    Grade 0    Minutes 15    METs 2.53      Bike   Level 3    Minutes 15    METs 2.9             Nutrition:  Target Goals: Understanding of nutrition guidelines, daily intake of sodium 1500mg , cholesterol 200mg , calories 30% from fat and 7% or less from saturated fats, daily to have 5 or more servings of fruits and vegetables.  Biometrics:  Pre Biometrics - 06/18/23 1202       Pre Biometrics   Grip Strength 32 kg              Nutrition Therapy Plan and Nutrition Goals:  Nutrition Therapy & Goals - 06/29/23 0941       Nutrition Therapy   Diet Heart healthy Diet    Drug/Food Interactions Statins/Certain Fruits      Personal Nutrition Goals   Nutrition Goal Patient to improve diet quality by using the plate method as a guide for meal planning to include lean protein/plant protein, fruits, vegetables, whole grains, nonfat dairy as part of a well-balanced diet.    Personal Goal #2 Patient to identify strategies for weight loss of 0.5-2.0# per week.    Comments Acey reports eating three meals daily plus snacks. She reports motivation to lose weight to improve pre-diabetes. She is down about 4# since May 2024. She works part time as a Lawyer. Patient will benefit  from participation in pulmonary rehab for nutrition, exercise, and lifestyle modification.      Intervention Plan   Intervention Prescribe, educate and counsel regarding individualized specific dietary modifications aiming towards targeted core components such as weight, hypertension, lipid management, diabetes, heart failure and other comorbidities.;Nutrition handout(s) given to patient.    Expected Outcomes Short Term Goal: Understand basic principles of dietary content, such as calories, fat, sodium, cholesterol and nutrients.;Long Term Goal: Adherence to prescribed nutrition plan.             Nutrition Assessments:  MEDIFICTS Score Key: ?70 Need to make dietary changes  40-70 Heart Healthy Diet ? 40 Therapeutic Level Cholesterol Diet   Picture Your Plate Scores: <55 Unhealthy dietary pattern with much room for improvement. 41-50 Dietary pattern unlikely to meet recommendations for good health and room for improvement. 51-60 More healthful dietary pattern, with some room for improvement.  >60 Healthy dietary pattern, although there may be some specific behaviors that could be improved.    Nutrition Goals Re-Evaluation:  Nutrition Goals Re-Evaluation     Row Name 06/29/23 0941             Goals   Current Weight 214 lb 1.1 oz (97.1 kg)       Comment A1c 6.1, HDL 38       Expected Outcome Dareen reports eating three meals daily plus snacks. She reports motivation to lose weight to improve pre-diabetes. She is down about 4# since May 2024. She works part time as a Lawyer. Patient will benefit  from participation in pulmonary rehab for nutrition, exercise, and lifestyle modification.                Nutrition Goals Discharge (Final Nutrition Goals Re-Evaluation):  Nutrition Goals Re-Evaluation - 06/29/23 0941       Goals   Current Weight 214 lb 1.1 oz (97.1 kg)    Comment A1c 6.1, HDL 38    Expected Outcome Cyril reports eating three meals daily plus snacks. She  reports motivation to lose weight to improve pre-diabetes. She is down about 4# since May 2024. She works part time as a Lawyer. Patient will benefit from participation in pulmonary rehab for nutrition, exercise, and lifestyle modification.             Psychosocial: Target Goals: Acknowledge presence or absence of significant depression and/or stress, maximize coping skills, provide positive support system. Participant is able to verbalize types and ability to use techniques and skills needed for reducing stress and depression.  Initial Review & Psychosocial Screening:  Initial Psych Review & Screening - 06/18/23 1044       Initial Review   Current issues with None Identified      Family Dynamics   Good Support System? Yes   sister, sons   Comments sister, sons      Barriers   Psychosocial barriers to participate in program There are no identifiable barriers or psychosocial needs.;The patient should benefit from training in stress management and relaxation.      Screening Interventions   Interventions Encouraged to exercise    Expected Outcomes Short Term goal: Utilizing psychosocial counselor, staff and physician to assist with identification of specific Stressors or current issues interfering with healing process. Setting desired goal for each stressor or current issue identified.;Long Term Goal: Stressors or current issues are controlled or eliminated.;Short Term goal: Identification and review with participant of any Quality of Life or Depression concerns found by scoring the questionnaire.;Long Term goal: The participant improves quality of Life and PHQ9 Scores as seen by post scores and/or verbalization of changes             Quality of Life Scores:  Scores of 19 and below usually indicate a poorer quality of life in these areas.  A difference of  2-3 points is a clinically meaningful difference.  A difference of 2-3 points in the total score of the Quality of Life Index has  been associated with significant improvement in overall quality of life, self-image, physical symptoms, and general health in studies assessing change in quality of life.  PHQ-9: Review Flowsheet  More data exists      06/18/2023 07/23/2022 03/24/2022 09/24/2021 03/24/2021  Depression screen PHQ 2/9  Decreased Interest 1 0 0 0 0  Down, Depressed, Hopeless 0 0 0 0 0  PHQ - 2 Score 1 0 0 0 0  Altered sleeping 1 0 0 - 0  Tired, decreased energy 1 2 0 - 0  Change in appetite 0 0 0 - 0  Feeling bad or failure about yourself  0 0 0 - 0  Trouble concentrating 0 0 0 - 0  Moving slowly or fidgety/restless 0 0 0 - 0  Suicidal thoughts 0 0 0 - 0  PHQ-9 Score 3 2 0 - 0  Difficult doing work/chores Somewhat difficult Not difficult at all Not difficult at all - -    Details           Interpretation of Total Score  Total Score Depression Severity:  1-4 = Minimal depression, 5-9 = Mild depression, 10-14 = Moderate depression, 15-19 = Moderately severe depression, 20-27 = Severe depression   Psychosocial Evaluation and Intervention:  Psychosocial Evaluation - 06/18/23 1045       Psychosocial Evaluation & Interventions   Interventions Stress management education;Relaxation education;Encouraged to exercise with the program and follow exercise prescription    Expected Outcomes For pt to participate in exercise and education    Continue Psychosocial Services  No Follow up required             Psychosocial Re-Evaluation:  Psychosocial Re-Evaluation     Row Name 06/21/23 0919 07/12/23 1129           Psychosocial Re-Evaluation   Current issues with None Identified None Identified      Comments No new psychosocial barriers or concerns since orientation on 8/2. Pt is scheduled to start on 8/8. Xaviera has only attended one session. She has doctors appointments she has to attend and will be coming back to class on 9/3. No new barriers or concerns at this time.      Expected Outcomes For Alwilda to  participate in PR free of any psychosocial barriers or concerns For Pier to participate in PR free of any psychosocial barriers or concerns      Interventions Encouraged to attend Pulmonary Rehabilitation for the exercise Encouraged to attend Pulmonary Rehabilitation for the exercise      Continue Psychosocial Services  No Follow up required No Follow up required               Psychosocial Discharge (Final Psychosocial Re-Evaluation):  Psychosocial Re-Evaluation - 07/12/23 1129       Psychosocial Re-Evaluation   Current issues with None Identified    Comments Valory has only attended one session. She has doctors appointments she has to attend and will be coming back to class on 9/3. No new barriers or concerns at this time.    Expected Outcomes For Makelle to participate in PR free of any psychosocial barriers or concerns    Interventions Encouraged to attend Pulmonary Rehabilitation for the exercise    Continue Psychosocial Services  No Follow up required             Education: Education Goals: Education classes will be provided on a weekly basis, covering required topics. Participant will state understanding/return demonstration of topics presented.  Learning Barriers/Preferences:  Learning Barriers/Preferences - 06/18/23 1046       Learning Barriers/Preferences   Learning Barriers None    Learning Preferences Written Material             Education Topics: Introduction to Pulmonary Rehab Group instruction provided by PowerPoint, verbal discussion, and written material to support subject matter. Instructor reviews what Pulmonary Rehab is, the purpose of the program, and how patients are referred.     Know Your Numbers Group instruction that is supported by a PowerPoint presentation. Instructor discusses importance of knowing and understanding resting, exercise, and post-exercise oxygen saturation, heart rate, and blood pressure. Oxygen saturation, heart rate, blood  pressure, rating of perceived exertion, and dyspnea are reviewed along with a normal range for these values.    Exercise for the Pulmonary Patient Group instruction that is supported by a PowerPoint presentation. Instructor discusses benefits of exercise, core components of exercise, frequency, duration, and intensity of an exercise routine, importance of utilizing pulse oximetry during exercise, safety while exercising, and options of places to exercise outside of rehab.  MET Level  Group instruction provided by PowerPoint, verbal discussion, and written material to support subject matter. Instructor reviews what METs are and how to increase METs.    Pulmonary Medications Verbally interactive group education provided by instructor with focus on inhaled medications and proper administration.   Anatomy and Physiology of the Respiratory System Group instruction provided by PowerPoint, verbal discussion, and written material to support subject matter. Instructor reviews respiratory cycle and anatomical components of the respiratory system and their functions. Instructor also reviews differences in obstructive and restrictive respiratory diseases with examples of each.    Oxygen Safety Group instruction provided by PowerPoint, verbal discussion, and written material to support subject matter. There is an overview of "What is Oxygen" and "Why do we need it".  Instructor also reviews how to create a safe environment for oxygen use, the importance of using oxygen as prescribed, and the risks of noncompliance. There is a brief discussion on traveling with oxygen and resources the patient may utilize.   Oxygen Use Group instruction provided by PowerPoint, verbal discussion, and written material to discuss how supplemental oxygen is prescribed and different types of oxygen supply systems. Resources for more information are provided.    Breathing Techniques Group instruction that is  supported by demonstration and informational handouts. Instructor discusses the benefits of pursed lip and diaphragmatic breathing and detailed demonstration on how to perform both.     Risk Factor Reduction Group instruction that is supported by a PowerPoint presentation. Instructor discusses the definition of a risk factor, different risk factors for pulmonary disease, and how the heart and lungs work together.   MD Day A group question and answer session with a medical doctor that allows participants to ask questions that relate to their pulmonary disease state.   Nutrition for the Pulmonary Patient Group instruction provided by PowerPoint slides, verbal discussion, and written materials to support subject matter. The instructor gives an explanation and review of healthy diet recommendations, which includes a discussion on weight management, recommendations for fruit and vegetable consumption, as well as protein, fluid, caffeine, fiber, sodium, sugar, and alcohol. Tips for eating when patients are short of breath are discussed.    Other Education Group or individual verbal, written, or video instructions that support the educational goals of the pulmonary rehab program.    Knowledge Questionnaire Score:  Knowledge Questionnaire Score - 06/18/23 1052       Knowledge Questionnaire Score   Pre Score 14/18             Core Components/Risk Factors/Patient Goals at Admission:  Personal Goals and Risk Factors at Admission - 06/18/23 1046       Core Components/Risk Factors/Patient Goals on Admission    Weight Management Yes;Weight Loss   pt newly pre-DM. Working on [employer] loss   Intervention Weight Management: Develop a combined nutrition and exercise program designed to reach desired caloric intake, while maintaining appropriate intake of nutrient and fiber, sodium and fats, and appropriate energy expenditure required for the weight goal.;Weight Management: Provide education and  appropriate resources to help participant work on and attain dietary goals.;Weight Management/Obesity: Establish reasonable short term and long term weight goals.;Obesity: Provide education and appropriate resources to help participant work on and attain dietary goals.    Admit Weight 212 lb 4.9 oz (96.3 kg)    Expected Outcomes Weight Loss: Understanding of general recommendations for a balanced deficit meal plan, which promotes 1-2 lb weight loss per week and includes a negative energy balance of  [phone] kcal/d;Long Term: Adherence to nutrition and physical activity/exercise program aimed toward attainment of established weight goal    Improve shortness of breath with ADL's Yes    Intervention Provide education, individualized exercise plan and daily activity instruction to help decrease symptoms of SOB with activities of daily living.    Expected Outcomes Short Term: Improve cardiorespiratory fitness to achieve a reduction of symptoms when performing ADLs;Long Term: Be able to perform more ADLs without symptoms or delay the onset of symptoms             Core Components/Risk Factors/Patient Goals Review:   Goals and Risk Factor Review     Row Name 06/21/23 0921 07/12/23 1133           Core Components/Risk Factors/Patient Goals Review   Personal Goals Review Weight Management/Obesity;Diabetes;Improve shortness of breath with ADL's;Develop more efficient breathing techniques such as purse lipped breathing and diaphragmatic breathing and practicing self-pacing with activity. Weight Management/Obesity;Improve shortness of breath with ADL's;Develop more efficient breathing techniques such as purse lipped breathing and diaphragmatic breathing and practicing self-pacing with activity.      Review Pt is scheduled to start on 8/8. We will continue to monitor her progress and goals throughout the program. Kandyce has only attended one class. She had to postpone her classes until 9/3 due to doctor  appointments. Goals in progress.      Expected Outcomes See admission goals See admission goals               Core Components/Risk Factors/Patient Goals at Discharge (Final Review):   Goals and Risk Factor Review - 07/12/23 1133       Core Components/Risk Factors/Patient Goals Review   Personal Goals Review Weight Management/Obesity;Improve shortness of breath with ADL's;Develop more efficient breathing techniques such as purse lipped breathing and diaphragmatic breathing and practicing self-pacing with activity.    Review Naliah has only attended one class. She had to postpone her classes until 9/3 due to doctor appointments. Goals in progress.    Expected Outcomes See admission goals             ITP Comments:Pt is making expected progress toward Pulmonary Rehab goals after completing 2 sessions. Recommend continued exercise, life style modification, education, and utilization of breathing techniques to increase stamina and strength, while also decreasing shortness of breath with exertion.  Dr. Mechele Collin is Medical Director for Pulmonary Rehab at [facility].     Comments: Dr. Mechele Collin is Medical Director for Pulmonary Rehab at [facility].

## 2023-07-22 ENCOUNTER — Encounter (HOSPITAL_COMMUNITY)
Admission: RE | Admit: 2023-07-22 | Discharge: 2023-07-22 | Disposition: A | Discharge status: Home / Self Care | Payer: Managed Care, Other (non HMO) | Source: Ambulatory Visit | Attending: Pulmonary Disease | Admitting: Pulmonary Disease

## 2023-07-22 DIAGNOSIS — Z87891 Personal history of nicotine dependence: Secondary | ICD-10-CM | POA: Diagnosis not present

## 2023-07-22 DIAGNOSIS — J449 Chronic obstructive pulmonary disease, unspecified: Secondary | ICD-10-CM

## 2023-07-22 DIAGNOSIS — Z5189 Encounter for other specified aftercare: Secondary | ICD-10-CM | POA: Diagnosis not present

## 2023-07-22 NOTE — Progress Notes (Signed)
Daily Session Note  Patient Details  Name: Caitlin [name] MRN: [id number] Date of Birth: 1966/01/12 Referring Provider:   Doristine Devoid Pulmonary Rehab Walk Test from 06/18/2023 in [facility]  Referring Provider Everardo All       Encounter Date: 07/22/2023  Check In:  Session Check In - 07/22/23 0836       Check-In   Supervising physician immediately available to respond to emergencies [facility] MD immediately available    Physician(s) Elizabeth Sauer, NP    Location [facility]    Staff Present Essie Hart, RN, BSN;Randi Idelle Crouch BS, ACSM-CEP, Exercise Physiologist;Kaylee Earlene Plater, MS, ACSM-CEP, Exercise Physiologist;Casey Hermine Messick Belarus, RD, LDN    Virtual Visit No    Medication changes reported     No    Fall or balance concerns reported    No    Tobacco Cessation No Change    Warm-up and Cool-down Performed as group-led instruction    Resistance Training Performed Yes    VAD Patient? No    PAD/SET Patient? No      Pain Assessment   Currently in Pain? No/denies             Capillary Blood Glucose: No results found for this or any previous visit (from the past 24 hour(s)).    Social History   Tobacco Use  Smoking Status Former   Current packs/day: 0.00   Types: Cigarettes   Quit date: 08/16/2017   Years since quitting: 5.9  Smokeless Tobacco Never  Tobacco Comments   quit 07/12/17, abstaining 8/24    Goals Met:  Independence with exercise equipment Exercise tolerated well No report of concerns or symptoms today Strength training completed today  Goals Unmet:  Not Applicable  Comments: Service time is from 0819 to 0940    Dr. Mechele Collin is Medical Director for Pulmonary Rehab at [facility].

## 2023-07-27 ENCOUNTER — Encounter (HOSPITAL_COMMUNITY)
Admission: RE | Admit: 2023-07-27 | Discharge: 2023-07-27 | Disposition: A | Discharge status: Home / Self Care | Payer: Managed Care, Other (non HMO) | Source: Ambulatory Visit | Attending: Pulmonary Disease | Admitting: Pulmonary Disease

## 2023-07-27 DIAGNOSIS — J449 Chronic obstructive pulmonary disease, unspecified: Secondary | ICD-10-CM | POA: Diagnosis not present

## 2023-07-27 DIAGNOSIS — Z5189 Encounter for other specified aftercare: Secondary | ICD-10-CM | POA: Diagnosis not present

## 2023-07-27 DIAGNOSIS — Z87891 Personal history of nicotine dependence: Secondary | ICD-10-CM | POA: Diagnosis not present

## 2023-07-27 NOTE — Progress Notes (Signed)
Daily Session Note  Patient Details  Name: Caitlin [name] MRN: [id number] Date of Birth: September 02, 1966 Referring Provider:   Doristine Devoid Pulmonary Rehab Walk Test from 06/18/2023 in [facility]  Referring Provider Everardo All       Encounter Date: 07/27/2023  Check In:  Session Check In - 07/27/23 8416       Check-In   Supervising physician immediately available to respond to emergencies [facility] MD immediately available    Physician(s) Joni Reining, NP    [facility]    Staff Present Essie Hart, RN, BSN;Randi Idelle Crouch BS, ACSM-CEP, Exercise Physiologist;Kaylee Earlene Plater, MS, ACSM-CEP, Exercise Physiologist;Rhea Thrun Katrinka Blazing, RT    Virtual Visit No    Medication changes reported     No    Fall or balance concerns reported    No    Tobacco Cessation No Change    Warm-up and Cool-down Performed as group-led instruction    Resistance Training Performed Yes    VAD Patient? No    PAD/SET Patient? No      Pain Assessment   Currently in Pain? No/denies    Multiple Pain Sites No             Capillary Blood Glucose: No results found for this or any previous visit (from the past 24 hour(s)).    Social History   Tobacco Use  Smoking Status Former   Current packs/day: 0.00   Types: Cigarettes   Quit date: 08/16/2017   Years since quitting: 5.9  Smokeless Tobacco Never  Tobacco Comments   quit 07/12/17, abstaining 8/24    Goals Met:  Proper associated with RPD/PD & O2 Sat Independence with exercise equipment Exercise tolerated well No report of concerns or symptoms today Strength training completed today  Goals Unmet:  Not Applicable  Comments: Service time is from 0815 to 0936.    Dr. Mechele Collin is Medical Director for Pulmonary Rehab at [facility].

## 2023-07-29 ENCOUNTER — Other Ambulatory Visit: Payer: Self-pay | Admitting: Family Medicine

## 2023-07-29 ENCOUNTER — Encounter (HOSPITAL_COMMUNITY)
Admission: RE | Admit: 2023-07-29 | Discharge: 2023-07-29 | Disposition: A | Discharge status: Home / Self Care | Payer: Managed Care, Other (non HMO) | Source: Ambulatory Visit | Attending: Pulmonary Disease | Admitting: Pulmonary Disease

## 2023-07-29 DIAGNOSIS — J449 Chronic obstructive pulmonary disease, unspecified: Secondary | ICD-10-CM

## 2023-07-29 DIAGNOSIS — Z5189 Encounter for other specified aftercare: Secondary | ICD-10-CM | POA: Diagnosis not present

## 2023-07-29 DIAGNOSIS — Z87891 Personal history of nicotine dependence: Secondary | ICD-10-CM | POA: Diagnosis not present

## 2023-07-29 NOTE — Progress Notes (Signed)
Daily Session Note  Patient Details  Name: Caitlin [name] MRN: [id number] Date of Birth: 01-26-1966 Referring Provider:   Doristine Devoid Pulmonary Rehab Walk Test from 06/18/2023 in [facility]  Referring Provider Everardo All       Encounter Date: 07/29/2023  Check In:  Session Check In - 07/29/23 0855       Check-In   Supervising physician immediately available to respond to emergencies [facility] MD immediately available    Physician(s) Edd Fabian, NP    [facility]    Staff Present Essie Hart, RN, BSN;Randi Idelle Crouch BS, ACSM-CEP, Exercise Physiologist;Kaylee Earlene Plater, MS, ACSM-CEP, Exercise Physiologist;Casey Katrinka Blazing, RT    Virtual Visit No    Medication changes reported     No    Fall or balance concerns reported    No    Tobacco Cessation No Change    Warm-up and Cool-down Performed as group-led instruction    Resistance Training Performed Yes    VAD Patient? No    PAD/SET Patient? No      Pain Assessment   Currently in Pain? No/denies    Multiple Pain Sites No             Capillary Blood Glucose: No results found for this or any previous visit (from the past 24 hour(s)).    Social History   Tobacco Use  Smoking Status Former   Current packs/day: 0.00   Types: Cigarettes   Quit date: 08/16/2017   Years since quitting: 5.9  Smokeless Tobacco Never  Tobacco Comments   quit 07/12/17, abstaining 8/24    Goals Met:  Independence with exercise equipment Exercise tolerated well No report of concerns or symptoms today Strength training completed today  Goals Unmet:  Not Applicable  Comments: Service time is from 0811 to 0929    Dr. Mechele Collin is Medical Director for Pulmonary Rehab at [facility].

## 2023-08-02 ENCOUNTER — Ambulatory Visit (HOSPITAL_BASED_OUTPATIENT_CLINIC_OR_DEPARTMENT_OTHER): Payer: Commercial Managed Care - HMO | Admitting: Pulmonary Disease

## 2023-08-02 DIAGNOSIS — J449 Chronic obstructive pulmonary disease, unspecified: Secondary | ICD-10-CM

## 2023-08-02 LAB — PULMONARY FUNCTION TEST
DL/VA % pred: 81 %
DL/VA: 3.3 ml/min/mmHg/L
DLCO cor % pred: 62 %
DLCO cor: 14.96 ml/min/mmHg
DLCO unc % pred: 62 %
DLCO unc: 14.96 ml/min/mmHg
FEF 25-75 Post: 0.95 L/s
FEF 25-75 Pre: 0.8 L/s
FEF2575-%Change-Post: 18 %
FEF2575-%Pred-Post: 35 %
FEF2575-%Pred-Pre: 29 %
FEV1-%Change-Post: 6 %
FEV1-%Pred-Post: 54 %
FEV1-%Pred-Pre: 50 %
FEV1-Post: 1.67 L
FEV1-Pre: 1.57 L
FEV1FVC-%Change-Post: 2 %
FEV1FVC-%Pred-Pre: 72 %
FEV6-%Change-Post: 5 %
FEV6-%Pred-Post: 73 %
FEV6-%Pred-Pre: 70 %
FEV6-Post: 2.83 L
FEV6-Pre: 2.7 L
FEV6FVC-%Change-Post: 1 %
FEV6FVC-%Pred-Post: 102 %
FEV6FVC-%Pred-Pre: 100 %
FVC-%Change-Post: 3 %
FVC-%Pred-Post: 72 %
FVC-%Pred-Pre: 69 %
FVC-Post: 2.87 L
FVC-Pre: 2.78 L
Post FEV1/FVC ratio: 58 %
Post FEV6/FVC ratio: 99 %
Pre FEV1/FVC ratio: 56 %
Pre FEV6/FVC Ratio: 97 %
RV % pred: 117 %
RV: 2.61 L
TLC % pred: 93 %
TLC: 5.45 L

## 2023-08-02 NOTE — Progress Notes (Signed)
Full PFT Performed Today  

## 2023-08-02 NOTE — Patient Instructions (Signed)
Full PFT Performed Today

## 2023-08-03 ENCOUNTER — Encounter (HOSPITAL_COMMUNITY): Payer: Managed Care, Other (non HMO)

## 2023-08-05 ENCOUNTER — Encounter (HOSPITAL_COMMUNITY)
Admission: RE | Admit: 2023-08-05 | Discharge: 2023-08-05 | Disposition: A | Discharge status: Home / Self Care | Payer: Managed Care, Other (non HMO) | Source: Ambulatory Visit | Attending: Pulmonary Disease | Admitting: Pulmonary Disease

## 2023-08-05 DIAGNOSIS — Z5189 Encounter for other specified aftercare: Secondary | ICD-10-CM | POA: Diagnosis not present

## 2023-08-05 DIAGNOSIS — J449 Chronic obstructive pulmonary disease, unspecified: Secondary | ICD-10-CM | POA: Diagnosis not present

## 2023-08-05 DIAGNOSIS — Z87891 Personal history of nicotine dependence: Secondary | ICD-10-CM | POA: Diagnosis not present

## 2023-08-05 NOTE — Progress Notes (Signed)
Daily Session Note  Patient Details  Name: Caitlin [name] MRN: [id number] Date of Birth: February 13, 1966 Referring Provider:   Doristine Devoid Pulmonary Rehab Walk Test from 06/18/2023 in [facility]  Referring Provider Everardo All       Encounter Date: 08/05/2023  Check In:  Session Check In - 08/05/23 0841       Check-In   Supervising physician immediately available to respond to emergencies [facility] MD immediately available    Physician(s) Jari Favre, NP    [facility]    Staff Present Essie Hart, RN, BSN;Randi Idelle Crouch BS, ACSM-CEP, Exercise Physiologist;Kaylee Earlene Plater, MS, ACSM-CEP, Exercise Physiologist;Danielle Lento Katrinka Blazing, RT    Virtual Visit No    Medication changes reported     No    Fall or balance concerns reported    No    Tobacco Cessation No Change    Warm-up and Cool-down Performed as group-led instruction    Resistance Training Performed Yes    VAD Patient? No    PAD/SET Patient? No      Pain Assessment   Currently in Pain? No/denies    Multiple Pain Sites No             Capillary Blood Glucose: No results found for this or any previous visit (from the past 24 hour(s)).    Social History   Tobacco Use  Smoking Status Former   Current packs/day: 0.00   Types: Cigarettes   Quit date: 08/16/2017   Years since quitting: 5.9  Smokeless Tobacco Never  Tobacco Comments   quit 07/12/17, abstaining 8/24    Goals Met:  Proper associated with RPD/PD & O2 Sat Independence with exercise equipment Exercise tolerated well No report of concerns or symptoms today Strength training completed today  Goals Unmet:  Not Applicable  Comments: Service time is from 0810 to 0930.    Dr. Mechele Collin is Medical Director for Pulmonary Rehab at [facility].

## 2023-08-10 ENCOUNTER — Telehealth (HOSPITAL_COMMUNITY): Payer: Self-pay

## 2023-08-10 ENCOUNTER — Encounter (HOSPITAL_COMMUNITY): Payer: Managed Care, Other (non HMO)

## 2023-08-10 NOTE — Telephone Encounter (Signed)
Caitlin [name] called and right now she has to much going on. She has decided to drop all classes and will not be returning.

## 2023-08-11 NOTE — Progress Notes (Signed)
Discharge Progress Report  Patient Details  Name: Caitlin [name] MRN: [id number] Date of Birth: 06/11/1966 Referring Provider:   Doristine Devoid Pulmonary Rehab Walk Test from 06/18/2023 in [facility]  Referring Provider Everardo All        Number of Visits: 6  Reason for Discharge:  Early Exit:  Personal  Smoking History:  Social History   Tobacco Use  Smoking Status Former   Current packs/day: 0.00   Types: Cigarettes   Quit date: 08/16/2017   Years since quitting: 5.9  Smokeless Tobacco Never  Tobacco Comments   quit 07/12/17, abstaining 8/24    Diagnosis:  Stage 2 moderate COPD by GOLD classification (HCC)  ADL UCSD:  Pulmonary Assessment Scores     Row Name 06/18/23 1050         ADL UCSD   ADL Phase Entry     SOB Score total 61       CAT Score   CAT Score 18       mMRC Score   mMRC Score 4              Initial Exercise Prescription:  Initial Exercise Prescription - 06/18/23 1200       Date of Initial Exercise RX and Referring Provider   Date 06/18/23    Referring Provider Everardo All    Expected Discharge Date 09/09/23      Treadmill   MPH 2.2    Grade 0    Minutes 15    METs 3      Bike   Level 1    Minutes 15    METs 3      Prescription Details   Frequency (times per week) 2    Duration Progress to 30 minutes of continuous aerobic without signs/symptoms of physical distress      Intensity   THRR 40-80% of Max Heartrate 64-129    Ratings of Perceived Exertion 11-13    Perceived Dyspnea 0-4      Progression   Progression Continue to progress workloads to maintain intensity without signs/symptoms of physical distress.      Resistance Training   Training Prescription Yes    Weight blue bands    Reps 10-15             Discharge Exercise Prescription (Final Exercise Prescription Changes):  Exercise Prescription Changes - 07/20/23 0900       Response to Exercise   Blood  Pressure (Admit) 102/70    Blood Pressure (Exercise) 124/80    Blood Pressure (Exit) 116/72    Heart Rate (Admit) 78 bpm    Heart Rate (Exercise) 89 bpm    Heart Rate (Exit) 71 bpm    Oxygen Saturation (Admit) 99 %    Oxygen Saturation (Exercise) 98 %    Oxygen Saturation (Exit) 99 %    Rating of Perceived Exertion (Exercise) 15    Perceived Dyspnea (Exercise) 2    Duration Continue with 30 min of aerobic exercise without signs/symptoms of physical distress.    Intensity THRR unchanged      Progression   Progression Continue to progress workloads to maintain intensity without signs/symptoms of physical distress.      Resistance Training   Training Prescription Yes    Weight blue bands    Reps 10-15    Time 10 Minutes      Treadmill   MPH 2    Grade 0    Minutes  15    METs 2.53      Bike   Level 3    Minutes 15    METs 2.9             Functional Capacity:  6 Minute Walk     Row Name 06/18/23 1152         6 Minute Walk   Phase Initial     Distance 1200 feet     Walk Time 6 minutes     # of Rest Breaks 0     MPH 2.27     METS 3.04     RPE 9     Perceived Dyspnea  1     VO2 Peak 10.65     Symptoms No     Resting HR 70 bpm     Resting BP 122/76     Resting Oxygen Saturation  99 %     Exercise Oxygen Saturation  during 6 min walk 96 %     Max Ex. HR 91 bpm     Max Ex. BP 132/72     2 Minute Post BP 110/72       Interval HR   1 Minute HR 85     2 Minute HR 89     3 Minute HR 91     4 Minute HR 90     5 Minute HR 96     6 Minute HR 91     2 Minute Post HR 60     Interval Heart Rate? Yes       Interval Oxygen   Interval Oxygen? Yes     Baseline Oxygen Saturation % 99 %     1 Minute Oxygen Saturation % 98 %     1 Minute Liters of Oxygen 0 L     2 Minute Oxygen Saturation % 96 %     2 Minute Liters of Oxygen 0 L     3 Minute Oxygen Saturation % 96 %     3 Minute Liters of Oxygen 0 L     4 Minute Oxygen Saturation % 97 %     4 Minute Liters of  Oxygen 0 L     5 Minute Oxygen Saturation % 96 %     5 Minute Liters of Oxygen 0 L     6 Minute Oxygen Saturation % 96 %     6 Minute Liters of Oxygen 0 L     2 Minute Post Oxygen Saturation % 98 %     2 Minute Post Liters of Oxygen 0 L              Psychological, QOL, Others - Outcomes: PHQ 2/9:    06/18/2023   10:43 AM 07/23/2022    2:10 PM 03/24/2022    8:57 AM 09/24/2021    9:08 AM 03/24/2021    9:32 AM  Depression screen PHQ 2/9  Decreased Interest 1 0 0 0 0  Down, Depressed, Hopeless 0 0 0 0 0  PHQ - 2 Score 1 0 0 0 0  Altered sleeping 1 0 0  0  Tired, decreased energy 1 2 0  0  Change in appetite 0 0 0  0  Feeling bad or failure about yourself  0 0 0  0  Trouble concentrating 0 0 0  0  Moving slowly or fidgety/restless 0 0 0  0  Suicidal thoughts 0 0 0  0  PHQ-9 Score 3 2  0  0  Difficult doing work/chores Somewhat difficult Not difficult at all Not difficult at all      Quality of Life:   Personal Goals: Goals established at orientation with interventions provided to work toward goal.  Personal Goals and Risk Factors at Admission - 06/18/23 1046       Core Components/Risk Factors/Patient Goals on Admission    Weight Management Yes;Weight Loss   pt newly pre-DM. Working on [employer] loss   Intervention Weight Management: Develop a combined nutrition and exercise program designed to reach desired caloric intake, while maintaining appropriate intake of nutrient and fiber, sodium and fats, and appropriate energy expenditure required for the weight goal.;Weight Management: Provide education and appropriate resources to help participant work on and attain dietary goals.;Weight Management/Obesity: Establish reasonable short term and long term weight goals.;Obesity: Provide education and appropriate resources to help participant work on and attain dietary goals.    Admit Weight 212 lb 4.9 oz (96.3 kg)    Expected Outcomes Weight Loss: Understanding of general recommendations for  a balanced deficit meal plan, which promotes 1-2 lb weight loss per week and includes a negative energy balance of [phone] kcal/d;Long Term: Adherence to nutrition and physical activity/exercise program aimed toward attainment of established weight goal    Improve shortness of breath with ADL's Yes    Intervention Provide education, individualized exercise plan and daily activity instruction to help decrease symptoms of SOB with activities of daily living.    Expected Outcomes Short Term: Improve cardiorespiratory fitness to achieve a reduction of symptoms when performing ADLs;Long Term: Be able to perform more ADLs without symptoms or delay the onset of symptoms              Personal Goals Discharge:  Goals and Risk Factor Review     Row Name 06/21/23 0921 07/12/23 1133 08/09/23 0943 08/11/23 0818       Core Components/Risk Factors/Patient Goals Review   Personal Goals Review Weight Management/Obesity;Diabetes;Improve shortness of breath with ADL's;Develop more efficient breathing techniques such as purse lipped breathing and diaphragmatic breathing and practicing self-pacing with activity. Weight Management/Obesity;Improve shortness of breath with ADL's;Develop more efficient breathing techniques such as purse lipped breathing and diaphragmatic breathing and practicing self-pacing with activity. Weight Management/Obesity;Improve shortness of breath with ADL's;Develop more efficient breathing techniques such as purse lipped breathing and diaphragmatic breathing and practicing self-pacing with activity. Weight Management/Obesity;Improve shortness of breath with ADL's;Develop more efficient breathing techniques such as purse lipped breathing and diaphragmatic breathing and practicing self-pacing with activity.    Review Pt is scheduled to start on 8/8. We will continue to monitor her progress and goals throughout the program. Amiaya has only attended one class. She had to postpone her classes  until 9/3 due to doctor appointments. Goals in progress. Goal in progress for weight loss. Diondria has not had success yet in losing weight but is still working with our dietician. She hopes that exercising will help. She has just started her journey in class and has completed 6 sessions so far. Goal met on developing more efficient breathing techniques such as purse lipped breathing and diaphragmatic breathing; and practicing self-pacing with activity. Areeba can initiate PLB and can self-pace herself while on the [employer] bike and treadmill. Goal in progress on improving her shortness of breath with ADLs. She has increased both her workload and METs while maintaining her oxygen saturation >88% on room air. Sherette will continue to benefit from PR for nutrition, education, exercise, and lifestyle modification. Rene Kocher  was unable to complete PR due to "having too much going on". She only attended 6 sessions. Hopefully Crysten will come back in the future.    Expected Outcomes See admission goals See admission goals [facility] to lose weight and improve her SOB with ADLs Pt will continue to apply knowledge she learned in PR             Exercise Goals and Review:  Exercise Goals     Row Name 07/13/23 1530             Exercise Goals   Increase Physical Activity Yes       Intervention Provide advice, education, support and counseling about physical activity/exercise needs.;Develop an individualized exercise prescription for aerobic and resistive training based on initial evaluation findings, risk stratification, comorbidities and participant's personal goals.       Expected Outcomes Short Term: Attend rehab on a regular basis to increase amount of physical activity.;Long Term: Exercising regularly at least 3-5 days a week.;Long Term: Add in home exercise to make exercise part of routine and to increase amount of physical activity.       Increase Strength and Stamina Yes       Intervention Provide  advice, education, support and counseling about physical activity/exercise needs.;Develop an individualized exercise prescription for aerobic and resistive training based on initial evaluation findings, risk stratification, comorbidities and participant's personal goals.       Expected Outcomes Short Term: Increase workloads from initial exercise prescription for resistance, speed, and METs.;Short Term: Perform resistance training exercises routinely during rehab and add in resistance training at home;Long Term: Improve cardiorespiratory fitness, muscular endurance and strength as measured by increased METs and functional capacity ( )       Able to understand and use rate of perceived exertion (RPE) scale Yes       Intervention Provide education and explanation on how to use RPE scale       Expected Outcomes Short Term: Able to use RPE daily in rehab to express subjective intensity level;Long Term:  Able to use RPE to guide intensity level when exercising independently       Able to understand and use Dyspnea scale Yes       Intervention Provide education and explanation on how to use Dyspnea scale       Expected Outcomes Short Term: Able to use Dyspnea scale daily in rehab to express subjective sense of shortness of breath during exertion;Long Term: Able to use Dyspnea scale to guide intensity level when exercising independently       Knowledge and understanding of Target Heart Rate Range (THRR) Yes       Intervention Provide education and explanation of THRR including how the numbers were predicted and where they are located for reference       Expected Outcomes Short Term: Able to state/look up THRR;Long Term: Able to use THRR to govern intensity when exercising independently;Short Term: Able to use daily as guideline for intensity in rehab       Understanding of Exercise Prescription Yes       Intervention Provide education, explanation, and written materials on patient's individual exercise  prescription       Expected Outcomes Short Term: Able to explain program exercise prescription;Long Term: Able to explain home exercise prescription to exercise independently                Exercise Goals Re-Evaluation:  Exercise Goals Re-Evaluation     Row Name 07/13/23  1530 08/10/23 1627           Exercise Goal Re-Evaluation   Exercise Goals Review Increase Physical Activity;Able to understand and use Dyspnea scale;Understanding of Exercise Prescription;Increase Strength and Stamina;Knowledge and understanding of Target Heart Rate Range (THRR);Able to understand and use rate of perceived exertion (RPE) scale Increase Physical Activity;Able to understand and use Dyspnea scale;Understanding of Exercise Prescription;Increase Strength and Stamina;Knowledge and understanding of Target Heart Rate Range (THRR);Able to understand and use rate of perceived exertion (RPE) scale      Comments Correy has completed 1 exercise session. She exercises for 15 min on the upright bike and treadmill. Tesa averages 2.3 METs at level 1 on the upright bike and 2.54 METs at 2 mph on the treadmill. Lamaya performs the warmup and cooldown standing without limitations. Librada has missed the last few sessions due to illness. She plans to return next week. Will continue to monitor and progress as able. Maddison has completed 6 exercise sessions. Her peak METs 3.2 on the upright bike and 2.7 on the treadmill. She performed the warmup and cooldown standing without limitations. Quantia has decided to be dishcarged from rehab for unknown reasons.      Expected Outcomes Through exercise at rehab and home, the patient will decrease shortness of breath with daily activities and feel confident in carrying out an exercise regimen at home. Through exercise at rehab and home, the patient will decrease shortness of breath with daily activities and feel confident in carrying out an exercise regimen at home.                Nutrition & Weight - Outcomes:  Pre Biometrics - 06/18/23 1202       Pre Biometrics   Grip Strength 32 kg              Nutrition:  Nutrition Therapy & Goals - 07/29/23 1551       Nutrition Therapy   Diet Heart healthy Diet    Drug/Food Interactions Statins/Certain Fruits      Personal Nutrition Goals   Nutrition Goal Patient to improve diet quality by using the plate method as a guide for meal planning to include lean protein/plant protein, fruits, vegetables, whole grains, nonfat dairy as part of a well-balanced diet.   goal in progress.   Personal Goal #2 Patient to identify strategies for weight loss of 0.5-2.0# per week.   goal not met.   Comments Keni has only attended 5 pulmonary rehab sessions due to covid-19 infection. She has not met weight loss goals at this time; she reports motivation to lose weight to improve pre-diabetes. She has maintained her weight since starting with our program. She is down about 4# since May 2024. Will continue to follow-up to address strategies to aid with weight loss/blood sugar management. She works part time as a Lawyer. Patient will benefit from participation in pulmonary rehab for nutrition, exercise, and lifestyle modification.      Intervention Plan   Intervention Prescribe, educate and counsel regarding individualized specific dietary modifications aiming towards targeted core components such as weight, hypertension, lipid management, diabetes, heart failure and other comorbidities.;Nutrition handout(s) given to patient.    Expected Outcomes Short Term Goal: Understand basic principles of dietary content, such as calories, fat, sodium, cholesterol and nutrients.;Long Term Goal: Adherence to prescribed nutrition plan.             Nutrition Discharge:   Education Questionnaire Score:  Knowledge Questionnaire Score - 06/18/23 1052  Knowledge Questionnaire Score   Pre Score 14/18             Goals reviewed with  patient; copy given to patient.

## 2023-08-12 ENCOUNTER — Encounter (HOSPITAL_COMMUNITY): Payer: Managed Care, Other (non HMO)

## 2023-08-13 ENCOUNTER — Ambulatory Visit (HOSPITAL_BASED_OUTPATIENT_CLINIC_OR_DEPARTMENT_OTHER): Payer: Commercial Managed Care - HMO | Admitting: Pulmonary Disease

## 2023-08-13 ENCOUNTER — Encounter (HOSPITAL_BASED_OUTPATIENT_CLINIC_OR_DEPARTMENT_OTHER): Payer: Commercial Managed Care - HMO

## 2023-08-17 ENCOUNTER — Encounter (HOSPITAL_COMMUNITY): Payer: Commercial Managed Care - HMO

## 2023-08-19 ENCOUNTER — Encounter (HOSPITAL_COMMUNITY): Payer: Commercial Managed Care - HMO

## 2023-08-24 ENCOUNTER — Encounter (HOSPITAL_COMMUNITY): Payer: Commercial Managed Care - HMO

## 2023-08-26 ENCOUNTER — Encounter (HOSPITAL_COMMUNITY): Payer: Commercial Managed Care - HMO

## 2023-08-26 ENCOUNTER — Ambulatory Visit (HOSPITAL_BASED_OUTPATIENT_CLINIC_OR_DEPARTMENT_OTHER): Payer: Commercial Managed Care - HMO | Admitting: Pulmonary Disease

## 2023-08-31 ENCOUNTER — Encounter (HOSPITAL_COMMUNITY): Payer: Commercial Managed Care - HMO

## 2023-09-02 ENCOUNTER — Encounter (HOSPITAL_COMMUNITY): Payer: Commercial Managed Care - HMO

## 2023-09-07 ENCOUNTER — Encounter (HOSPITAL_COMMUNITY): Payer: Commercial Managed Care - HMO

## 2023-09-09 ENCOUNTER — Encounter (HOSPITAL_COMMUNITY): Payer: Commercial Managed Care - HMO

## 2023-09-10 ENCOUNTER — Ambulatory Visit (HOSPITAL_COMMUNITY)
Admission: RE | Admit: 2023-09-10 | Discharge: 2023-09-10 | Disposition: A | Discharge status: Home / Self Care | Payer: Commercial Managed Care - HMO | Source: Ambulatory Visit | Attending: Internal Medicine | Admitting: Internal Medicine

## 2023-09-10 ENCOUNTER — Inpatient Hospital Stay: Payer: Managed Care, Other (non HMO) | Attending: Hematology and Oncology

## 2023-09-10 ENCOUNTER — Other Ambulatory Visit: Payer: Self-pay

## 2023-09-10 DIAGNOSIS — C3490 Malignant neoplasm of unspecified part of unspecified bronchus or lung: Secondary | ICD-10-CM | POA: Diagnosis not present

## 2023-09-10 DIAGNOSIS — Z902 Acquired absence of lung [part of]: Secondary | ICD-10-CM | POA: Insufficient documentation

## 2023-09-10 DIAGNOSIS — I700 Atherosclerosis of aorta: Secondary | ICD-10-CM | POA: Diagnosis not present

## 2023-09-10 DIAGNOSIS — K449 Diaphragmatic hernia without obstruction or gangrene: Secondary | ICD-10-CM | POA: Diagnosis not present

## 2023-09-10 DIAGNOSIS — Z79899 Other long term (current) drug therapy: Secondary | ICD-10-CM | POA: Insufficient documentation

## 2023-09-10 DIAGNOSIS — C3412 Malignant neoplasm of upper lobe, left bronchus or lung: Secondary | ICD-10-CM | POA: Insufficient documentation

## 2023-09-10 DIAGNOSIS — J432 Centrilobular emphysema: Secondary | ICD-10-CM | POA: Diagnosis not present

## 2023-09-10 LAB — CBC WITH DIFFERENTIAL (CANCER CENTER ONLY)
Abs Immature Granulocytes: 0.01 10*3/uL (ref 0.00–0.07)
Basophils Absolute: 0.1 10*3/uL (ref 0.0–0.1)
Basophils Relative: 1 %
Eosinophils Absolute: 0.2 10*3/uL (ref 0.0–0.5)
Eosinophils Relative: 3 %
HCT: 40.2 % (ref 36.0–46.0)
Hemoglobin: 13.5 g/dL (ref 12.0–15.0)
Immature Granulocytes: 0 %
Lymphocytes Relative: 55 %
Lymphs Abs: 3.6 10*3/uL (ref 0.7–4.0)
MCH: 28.3 pg (ref 26.0–34.0)
MCHC: 33.6 g/dL (ref 30.0–36.0)
MCV: 84.3 fL (ref 80.0–100.0)
Monocytes Absolute: 0.6 10*3/uL (ref 0.1–1.0)
Monocytes Relative: 9 %
Neutro Abs: 2.1 10*3/uL (ref 1.7–7.7)
Neutrophils Relative %: 32 %
Platelet Count: 331 10*3/uL (ref 150–400)
RBC: 4.77 MIL/uL (ref 3.87–5.11)
RDW: 13.9 % (ref 11.5–15.5)
WBC Count: 6.5 10*3/uL (ref 4.0–10.5)
nRBC: 0 % (ref 0.0–0.2)

## 2023-09-10 LAB — CMP (CANCER CENTER ONLY)
ALT: 23 U/L (ref 0–44)
AST: 17 U/L (ref 15–41)
Albumin: 4.3 g/dL (ref 3.5–5.0)
Alkaline Phosphatase: 56 U/L (ref 38–126)
Anion gap: 6 (ref 5–15)
BUN: 11 mg/dL (ref 6–20)
CO2: 25 mmol/L (ref 22–32)
Calcium: 9.4 mg/dL (ref 8.9–10.3)
Chloride: 109 mmol/L (ref 98–111)
Creatinine: 0.72 mg/dL (ref 0.44–1.00)
GFR, Estimated: >60 mL/min (ref >60)
Glucose, Bld: 69 mg/dL — ABNORMAL LOW (ref 70–99)
Potassium: 3.8 mmol/L (ref 3.5–5.1)
Sodium: 140 mmol/L (ref 135–145)
Total Bilirubin: 0.5 mg/dL (ref 0.3–1.2)
Total Protein: 7.4 g/dL (ref 6.5–8.1)

## 2023-09-10 MED ORDER — IOHEXOL 300 MG/ML  SOLN
75.0000 mL | Freq: Once | INTRAMUSCULAR | Status: AC | PRN
  Administered 2023-09-10: 75 mL via INTRAVENOUS

## 2023-09-14 ENCOUNTER — Inpatient Hospital Stay (HOSPITAL_BASED_OUTPATIENT_CLINIC_OR_DEPARTMENT_OTHER): Payer: Managed Care, Other (non HMO) | Admitting: Internal Medicine

## 2023-09-14 VITALS — BP 123/81 | HR 80 | Temp 98.3°F | Resp 17 | Wt 212.7 lb

## 2023-09-14 DIAGNOSIS — Z902 Acquired absence of lung [part of]: Secondary | ICD-10-CM | POA: Diagnosis not present

## 2023-09-14 DIAGNOSIS — C3490 Malignant neoplasm of unspecified part of unspecified bronchus or lung: Secondary | ICD-10-CM

## 2023-09-14 DIAGNOSIS — C3412 Malignant neoplasm of upper lobe, left bronchus or lung: Secondary | ICD-10-CM | POA: Diagnosis present

## 2023-09-14 DIAGNOSIS — K449 Diaphragmatic hernia without obstruction or gangrene: Secondary | ICD-10-CM | POA: Diagnosis not present

## 2023-09-14 DIAGNOSIS — Z79899 Other long term (current) drug therapy: Secondary | ICD-10-CM | POA: Diagnosis not present

## 2023-09-14 NOTE — Progress Notes (Signed)
[facility] [phone]   [phone]  OFFICE PROGRESS NOTE  [name], MD [address] [place] [address]  DIAGNOSIS: Stage IA (T1b, N0, M0) non-small cell lung cancer, adenocarcinoma presented with left upper lobe lung nodule The patient also had suspicious right axillary lymph nodes that were hypermetabolic on the PET scan but likely to be reactive in nature secondary to her booster COVID-vaccine few weeks before.  PRIOR THERAPY:  status post left upper lobectomy with lymph node sampling under the care of Dr. Cliffton Asters on January 08, 2022.  CURRENT THERAPY: Observation  INTERVAL HISTORY: Caitlin [name] 57 y.o. female returns to the clinic today for 10-month follow-up visit.Discussed the use of AI scribe software for clinical note transcription with the patient, who gave verbal consent to proceed.  History of Present Illness   The patient, a 57 year old with a history of stage 1A non-small cell lung adenocarcinoma, underwent a left upper lobectomy in February 2023. She has been under regular surveillance every six months since the procedure. Over the past six months, the patient reports no new health issues or changes in her health status. She denies experiencing chest pain, breathing difficulties, hemoptysis, nausea, vomiting, diarrhea, or weight loss.  However, the patient has been experiencing increased acid reflux, which she has been managing with over-the-counter medication. She was unaware of a moderate-sized hiatal hernia, which was identified on a recent scan and could be contributing to her reflux symptoms.       MEDICAL HISTORY: Past Medical History:  Diagnosis Date   CHF (congestive heart failure) (HCC)    COPD (chronic obstructive pulmonary disease) (HCC)    HLD (hyperlipidemia)    tx w/atorvastatin   NSTEMI (non-ST elevated myocardial infarction) (HCC)    pt denies   Takotsubo cardiomyopathy      ALLERGIES:  is allergic to aspirin and tramadol.  MEDICATIONS:  Current Outpatient Medications  Medication Sig Dispense Refill   albuterol (VENTOLIN HFA) 108 (90 Base) MCG/ACT inhaler Inhale 2 puffs into the lungs every 4 (four) hours as needed for wheezing or shortness of breath. 18 g 3   atorvastatin (LIPITOR) 10 MG tablet Take 1 tablet (10 mg total) by mouth daily. 90 tablet 1   METAMUCIL FIBER PO Take 3 tablets by mouth daily.     Omega-3 Fatty Acids (FISH OIL PO) Take 1 capsule by mouth daily.     umeclidinium-vilanterol (ANORO ELLIPTA) 62.5-25 MCG/ACT AEPB Inhale 1 puff into the lungs daily at 6 (six) AM. 60 each 6   Vitamin D, Ergocalciferol, (DRISDOL) 1.25 MG (50000 UNIT) CAPS capsule Take 1 capsule (50,000 Units total) by mouth every 7 (seven) days. 12 capsule 0   No current facility-administered medications for this visit.    SURGICAL HISTORY:  Past Surgical History:  Procedure Laterality Date   BRONCHIAL BIOPSY  01/08/2022   Procedure: BRONCHIAL BIOPSIES;  Surgeon: Josephine Igo, DO;  Location: [facility];  Service: Pulmonary;;   BRONCHIAL BRUSHINGS  01/08/2022   Procedure: BRONCHIAL BRUSHINGS;  Surgeon: Josephine Igo, DO;  Location: [facility];  Service: Pulmonary;;   BRONCHIAL NEEDLE ASPIRATION BIOPSY  01/08/2022   Procedure: BRONCHIAL NEEDLE ASPIRATION BIOPSIES;  Surgeon: Josephine Igo, DO;  Location: [facility];  Service: Pulmonary;;   FIDUCIAL MARKER PLACEMENT  01/08/2022   Procedure: FIDUCIAL DYE MARKING;  Surgeon: Josephine Igo, DO;  Location: [facility];  Service: Pulmonary;;   INTERCOSTAL NERVE BLOCK Left 01/08/2022   Procedure:  INTERCOSTAL NERVE BLOCK;  Surgeon: Corliss Skains, MD;  Location: [facility] OR;  Service: Thoracic;  Laterality: Left;   LEFT HEART CATH AND CORONARY ANGIOGRAPHY N/A 07/15/2017   Procedure: LEFT HEART CATH AND CORONARY ANGIOGRAPHY;  Surgeon: Swaz[place], Peter M, MD;  Location: [facility] LAB;  Service: Cardiovascular;   Laterality: N/A;   NODE DISSECTION Left 01/08/2022   Procedure: NODE DISSECTION;  Surgeon: Corliss Skains, MD;  Location: [facility] OR;  Service: Thoracic;  Laterality: Left;   TRACHEOSTOMY CLOSURE  2018   VIDEO BRONCHOSCOPY WITH RADIAL ENDOBRONCHIAL ULTRASOUND  01/08/2022   Procedure: VIDEO BRONCHOSCOPY WITH RADIAL ENDOBRONCHIAL ULTRASOUND;  Surgeon: Josephine Igo, DO;  Location: [facility];  Service: Pulmonary;;   WISDOM TOOTH EXTRACTION      REVIEW OF SYSTEMS:  A comprehensive review of systems was negative except for: Gastrointestinal: positive for dyspepsia   PHYSICAL EXAMINATION: General appearance: alert, cooperative, and no distress Head: Normocephalic, without obvious abnormality, atraumatic Neck: no adenopathy, no JVD, supple, symmetrical, trachea midline, and thyroid not enlarged, symmetric, no tenderness/mass/nodules Lymph nodes: Cervical, supraclavicular, and axillary nodes normal. Resp: clear to auscultation bilaterally Back: symmetric, no curvature. ROM normal. No CVA tenderness. Cardio: regular rate and rhythm, S1, S2 normal, no murmur, click, rub or gallop GI: soft, non-tender; bowel sounds normal; no masses,  no organomegaly Extremities: extremities normal, atraumatic, no cyanosis or edema  ECOG PERFORMANCE STATUS: 1 - Symptomatic but completely ambulatory  Blood pressure 123/81, pulse 80, temperature 98.3 F (36.8 C), temperature source Oral, resp. rate 17, weight 212 lb 11.2 oz (96.5 kg), last menstrual period 09/09/2014, SpO2 100%.  LABORATORY DATA: Lab Results  Component Value Date   WBC 6.5 09/10/2023   HGB 13.5 09/10/2023   HCT 40.2 09/10/2023   MCV 84.3 09/10/2023   PLT 331 09/10/2023      Chemistry      Component Value Date/Time   NA 140 09/10/2023 1023   NA 139 04/06/2023 0831   K 3.8 09/10/2023 1023   CL 109 09/10/2023 1023   CO2 25 09/10/2023 1023   BUN 11 09/10/2023 1023   BUN 9 04/06/2023 0831   CREATININE 0.72 09/10/2023 1023       Component Value Date/Time   CALCIUM 9.4 09/10/2023 1023   ALKPHOS 56 09/10/2023 1023   AST 17 09/10/2023 1023   ALT 23 09/10/2023 1023   BILITOT 0.5 09/10/2023 1023       RADIOGRAPHIC STUDIES: CT Chest W Contrast  Result Date: 09/13/2023 CLINICAL DATA:  57 year old female with history of non-small cell lung cancer. Staging examination. * Tracking Code: BO * EXAM: CT CHEST WITH CONTRAST TECHNIQUE: Multidetector CT imaging of the chest was performed during intravenous contrast administration. RADIATION DOSE REDUCTION: This exam was performed according to the departmental dose-optimization program which includes automated exposure control, adjustment of the mA and/or kV according to patient size and/or use of iterative reconstruction technique. CONTRAST:  75mL OMNIPAQUE IOHEXOL 300 MG/ML  SOLN COMPARISON:  Chest CT 03/10/2023. FINDINGS: Cardiovascular: Heart size is normal. There is no significant pericardial fluid, thickening or pericardial calcification. Aortic atherosclerosis. No definite coronary artery calcifications. Mediastinum/Nodes: No pathologically enlarged mediastinal or hilar lymph nodes. Moderate-sized hiatal hernia. No axillary lymphadenopathy. Lungs/Pleura: Status post left upper lobectomy. Compensatory hyperexpansion of the left lower lobe. No suspicious appearing pulmonary nodules or masses are noted. No acute consolidative airspace disease. No pleural effusions. Diffuse bronchial wall thickening with moderate to severe centrilobular and paraseptal emphysema. Upper Abdomen: Aortic atherosclerosis. Musculoskeletal: There are  no aggressive appearing lytic or blastic lesions noted in the visualized portions of the skeleton. IMPRESSION: 1. Status post left upper lobectomy without evidence to suggest locally recurrent disease or definite metastatic disease in the thorax. 2. Moderate-sized hiatal hernia. 3. Aortic Atherosclerosis (ICD10-I70.0) and Emphysema (ICD10-J43.9). Aortic  Atherosclerosis (ICD10-I70.0) and Emphysema (ICD10-J43.9). Electronically Signed   By: Trudie Reed M.D.   On: 09/13/2023 18:07    ASSESSMENT AND PLAN: This is a very pleasant 57 years old African-American female diagnosed with stage IA (T1b, N0, M0) non-small cell lung cancer, adenocarcinoma status post left upper lobectomy with lymph node sampling under the care of Dr. Cliffton Asters on January 08, 2022.  The patient has been on observation since that time and she is feeling fine. Assessment and Plan    Stage 1A Non-Small Cell Lung Cancer (Adenocarcinoma) Status post left upper lobectomy in February 2023. No new symptoms or concerning findings on recent chest scan. -Continue surveillance with next scan in 1 year.  Moderate Hiatal Hernia Noted on recent scan. Patient reports increased acid reflux symptoms. -Advise patient to eat and drink in upright position to minimize symptoms. -Consider surgical intervention if symptoms worsen.  Follow-up in 1 year.   The patient was advised to call immediately if she has any other concerning symptoms in the interval. The patient voices understanding of current disease status and treatment options and is in agreement with the current care plan.  All questions were answered. The patient knows to call the clinic with any problems, questions or concerns. We can certainly see the patient much sooner if necessary.  The total time spent in the appointment was 20 minutes.  Disclaimer: This note was dictated with voice recognition software. Similar sounding words can inadvertently be transcribed and may not be corrected upon review.

## 2023-09-18 ENCOUNTER — Encounter (HOSPITAL_BASED_OUTPATIENT_CLINIC_OR_DEPARTMENT_OTHER): Payer: Self-pay | Admitting: Emergency Medicine

## 2023-09-18 ENCOUNTER — Other Ambulatory Visit: Payer: Self-pay

## 2023-09-18 ENCOUNTER — Emergency Department (HOSPITAL_BASED_OUTPATIENT_CLINIC_OR_DEPARTMENT_OTHER)
Admission: EM | Admit: 2023-09-18 | Discharge: 2023-09-18 | Disposition: A | Discharge status: Home / Self Care | Payer: Commercial Managed Care - HMO | Attending: Emergency Medicine | Admitting: Emergency Medicine

## 2023-09-18 DIAGNOSIS — S0341XA Sprain of jaw, right side, initial encounter: Secondary | ICD-10-CM | POA: Diagnosis not present

## 2023-09-18 DIAGNOSIS — J449 Chronic obstructive pulmonary disease, unspecified: Secondary | ICD-10-CM | POA: Insufficient documentation

## 2023-09-18 DIAGNOSIS — S0340XA Sprain of jaw, unspecified side, initial encounter: Secondary | ICD-10-CM | POA: Diagnosis not present

## 2023-09-18 DIAGNOSIS — M26601 Right temporomandibular joint disorder, unspecified: Secondary | ICD-10-CM | POA: Diagnosis not present

## 2023-09-18 DIAGNOSIS — R220 Localized swelling, mass and lump, head: Secondary | ICD-10-CM | POA: Diagnosis present

## 2023-09-18 DIAGNOSIS — M26621 Arthralgia of right temporomandibular joint: Secondary | ICD-10-CM | POA: Diagnosis not present

## 2023-09-18 DIAGNOSIS — X58XXXA Exposure to other specified factors, initial encounter: Secondary | ICD-10-CM | POA: Insufficient documentation

## 2023-09-18 DIAGNOSIS — I509 Heart failure, unspecified: Secondary | ICD-10-CM | POA: Diagnosis not present

## 2023-09-18 DIAGNOSIS — Z79899 Other long term (current) drug therapy: Secondary | ICD-10-CM | POA: Diagnosis not present

## 2023-09-18 NOTE — Discharge Instructions (Signed)
Please follow-up with your primary care provider regards recent ER visit.  Today your exam was ultimately reassuring and you may have TMJ going on that would explain your symptoms.  You may take Tylenol every 6 hours as needed, ice your jaw, avoid taking large bites for the next few days to help with your symptoms.  If symptoms change or worsen please return to ER.

## 2023-09-18 NOTE — ED Triage Notes (Signed)
Right side facial pain. Notice this AM Some pain in area on and off for last week.  Denies injury.

## 2023-09-18 NOTE — ED Provider Notes (Signed)
[facility] EMERGENCY DEPARTMENT AT [facility] Provider Note   CSN: [id number] Arrival date & time: 09/18/23  1346     History  Chief Complaint  Patient presents with   Facial Swelling    Caitlin [name] is a 57 y.o. female history of CHF, COPD, NSTEMI, Takotsubo cardiomyopathy, HLD presented for right-sided facial swelling that began this morning but then shortly resolved.  Patient states she woke up this morning noticed of the right side of her face was slightly swollen and had sharp pains in her jaw when she took bites.  Since then patient states she has not had any symptoms and she stated they feel she pressed on her right jaw this would decrease her symptoms.  Patient denies history of TMJ, trauma, fevers, recent illnesses, ear pain, neck pain, dysphagia, recent dental procedures, recent chiropractor use, headache, fevers, sick contacts.   Home Medications Prior to Admission medications   Medication Sig Start Date End Date Taking? Authorizing Provider  albuterol (VENTOLIN HFA) 108 (90 Base) MCG/ACT inhaler Inhale 2 puffs into the lungs every 4 (four) hours as needed for wheezing or shortness of breath. 09/24/21   Storm Frisk, MD  atorvastatin (LIPITOR) 10 MG tablet Take 1 tablet (10 mg total) by mouth daily. 05/26/23   [name], MD  METAMUCIL FIBER PO Take 3 tablets by mouth daily.    [provider]  Omega-3 Fatty Acids (FISH OIL PO) Take 1 capsule by mouth daily.    [provider]  umeclidinium-vilanterol (ANORO ELLIPTA) 62.5-25 MCG/ACT AEPB Inhale 1 puff into the lungs daily at 6 (six) AM. 05/14/23   Luciano Cutter, MD  Vitamin D, Ergocalciferol, (DRISDOL) 1.25 MG (50000 UNIT) CAPS capsule Take 1 capsule (50,000 Units total) by mouth every 7 (seven) days. 04/08/23   [name], MD      Allergies    Aspirin and Tramadol    Review of Systems   Review of Systems  Physical Exam Updated Vital Signs BP (!) 147/84 (BP Location: Right Arm)    Pulse 83   Temp 98 F (36.7 C)   Resp 18   LMP 09/09/2014 (Approximate)   SpO2 100%  Physical Exam Constitutional:      General: She is not in acute distress.    Comments: No facial swelling noted or lymphadenopathy noted Unable to reproduce patient's right-sided jaw pain with palpation with no bony abnormalities noted  HENT:     Head: Normocephalic and atraumatic.     Jaw: There is normal jaw occlusion.     Salivary Glands: Right salivary gland is not diffusely enlarged or tender. Left salivary gland is not diffusely enlarged or tender.     Comments: No facial swelling Able to fully open jaw without pain No temporal artery tenderness    Right Ear: Hearing, tympanic membrane, ear canal and external ear normal.     Left Ear: Hearing, tympanic membrane, ear canal and external ear normal.     Nose: Nose normal.     Mouth/Throat:     Lips: Pink.     Mouth: Mucous membranes are moist.     Pharynx: Oropharynx is clear. Uvula midline.     Comments: No oral floor swelling Tolerating secretions No purulent drainage no No PTA noted No muffled voice noted Eyes:     Extraocular Movements: Extraocular movements intact.     Conjunctiva/sclera: Conjunctivae normal.     Pupils: Pupils are equal, round, and reactive to light.  Neck:  Comments: No neck swelling Cardiovascular:     Rate and Rhythm: Normal rate and regular rhythm.     Pulses: Normal pulses.     Heart sounds: Normal heart sounds.  Pulmonary:     Effort: Pulmonary effort is normal. No respiratory distress.     Breath sounds: Normal breath sounds.  Musculoskeletal:     Cervical back: Normal range of motion and neck supple. No rigidity or tenderness.  Skin:    General: Skin is warm and dry.  Neurological:     Mental Status: She is alert.     ED Results / Procedures / Treatments   Labs (all labs ordered are listed, but only abnormal results are displayed) Labs Reviewed - No data to  display  EKG None  Radiology No results found.  Procedures Procedures    Medications Ordered in ED Medications - No data to display  ED Course/ Medical Decision Making/ A&P                                 Medical Decision Making  [facility] 57 y.o. presented today for right jaw swelling. Working DDx that I considered at this time includes, but not limited to, TMJ, otitis media/externa, abscess, airway compromise, sepsis, lymphoma.  R/o DDx: otitis media/externa, abscess, airway compromise, sepsis, lymphoma: These are considered less likely due to history of present illness, physical exam, labs/imaging findings  Review of prior external notes: 07/01/2023 ED  Unique Tests and My Interpretation: None  Social Determinants of Health: none  Discussion with Independent Historian: None  Discussion of Management of Tests: None  Risk: Low: based on diagnostic testing/clinical impression and treatment plan  Risk Stratification Score: None  Plan: On exam patient was in no acute distress stable vitals.  On exam patient did not have a facial swelling or any concerning features as she had a completely reassuring physical exam.  Patient was indicating that she was having right jaw pain that hurts when she was taking bites of food for breakfast but got better as she was pressing on her jaw.  In the absence of other symptoms the area to which patient is indicating where she had pain is where her TMJ is in so patient may be having TMJ pain.  Patient and I had shared decision making we agreed to forego any labs or imaging at this time as patient was not having any symptoms and her story was not concerning for infectious, lymphoma cause of her symptoms.  Patient states she does not want to have any CT imaging done as she states she is over CT imaging at this time.  Encourage patient use Tylenol and ice and to follow-up with her primary care provider and to return to ER if symptoms are change or  worsen.  Patient also states that she does not want to be tested for COVID or swab for any viral illnesses as no one is sick around her and she does not have any infectious symptoms and does not believe this to be beneficial.  Patient was given return precautions. Patient stable for discharge at this time.  Patient verbalized understanding of plan.  This chart was dictated using voice recognition software.  Despite best efforts to proofread,  errors can occur which can change the documentation meaning.         Final Clinical Impression(s) / ED Diagnoses Final diagnoses:  TMJ (sprain of temporomandibular joint), initial encounter  Rx / DC Orders ED Discharge Orders     None         Remi Deter 09/18/23 1635    Benjiman Core, MD 09/18/23 2202

## 2023-09-28 ENCOUNTER — Encounter: Payer: Self-pay | Admitting: Internal Medicine

## 2023-09-28 ENCOUNTER — Ambulatory Visit: Payer: Managed Care, Other (non HMO) | Attending: Internal Medicine | Admitting: Internal Medicine

## 2023-09-28 VITALS — BP 124/82 | HR 73 | Ht 69.0 in | Wt 214.6 lb

## 2023-09-28 DIAGNOSIS — J449 Chronic obstructive pulmonary disease, unspecified: Secondary | ICD-10-CM

## 2023-09-28 DIAGNOSIS — I5181 Takotsubo syndrome: Secondary | ICD-10-CM

## 2023-09-28 DIAGNOSIS — E7800 Pure hypercholesterolemia, unspecified: Secondary | ICD-10-CM

## 2023-09-28 DIAGNOSIS — R0609 Other forms of dyspnea: Secondary | ICD-10-CM | POA: Diagnosis not present

## 2023-09-28 NOTE — Patient Instructions (Signed)
 Medication Instructions:  Your physician recommends that you continue on your current medications as directed. Please refer to the Current Medication list given to you today.  *If you need a refill on your cardiac medications before your next appointment, please call your pharmacy*  Lab Work: None  Testing/Procedures: None  Follow-Up: At [facility], you and your health needs are our priority.  As part of our continuing mission to provide you with exceptional heart care, we have created designated Provider Care Teams.  These Care Teams include your primary Cardiologist (physician) and Advanced Practice Providers (APPs -  Physician Assistants and Nurse Practitioners) who all work together to provide you with the care you need, when you need it.  Your next appointment:   12 month(s)  Provider:   Parke Poisson, MD

## 2023-09-28 NOTE — Progress Notes (Signed)
Cardiology Office Note:  .   Date:  09/28/2023  ID:  Caitlin [name], DOB 1966-01-15, MRN [id number] PCP: [name], MD  [facility] Providers Cardiologist:  Parke Poisson, MD    History of Present Illness: .   Caitlin [name] is a 57 y.o. female with hx of COPD, Takostubo CM, lung cancer s/p LUL resection, COPD, history of tobacco abuse and HLD.   Patient remotely seen by Dr. Jens Som. Patient admitted with NSTEMI in 06/2017. Cath with no disease. TTE consistent with takostubo with LVEF 40-45% with akinesis of the mid-apcial anteroseptal, anterior, inferoseptal and apical myocardium. EF improved in 08/2017 to 55-60%. Has not seen Cardiology since 2019.   Last visit, the patient having progressive SOB over the past several years. This began after her hospitalization for COPD exacerbation with trach placement and has progressed since that time. She has also had a LUL lobectomy for stage 1A lung cancer. No associated chest pain, orthopnea, or PND. Has occasional LE edema with prolonged standing. Myoview in 12/2021 normal.   Discussed the use of AI scribe software for clinical note transcription with the patient, who gave verbal consent to proceed.  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 The shortness of breath is not worse than usual and is attributed to the patient's COPD. The patient has adapted her activities to avoid exacerbating the shortness of breath, such as limiting strenuous activities and not exhausting herself. The patient underwent lung surgery almost two years ago and acknowledges a decrease in physical strength and conditioning since the surgery. Despite this, the patient feels she has adapted well to her current condition and is able to manage her daily activities without undue distress.  The patient also mentions a recent CT scan that  revealed a medium-sized hernia. This hernia is causing heartburn and indigestion, but the patient has been advised that surgery is not necessary unless the symptoms worsen. The patient is currently managing the symptoms by avoiding lying down after eating and other lifestyle modifications.  The patient is on cholesterol medication (atorvastatin) and has a history of lung cancer.        ROS: negative except per HPI above.  Studies Reviewed: Marland Kitchen   EKG Interpretation Date/Time:  Tuesday September 28 2023 08:32:56 EST Ventricular Rate:  73 PR Interval:  158 QRS Duration:  82 QT Interval:  390 QTC Calculation: 429 R Axis:   -6  Text Interpretation: Normal sinus rhythm Low voltage QRS Septal infarct , age undetermined When compared with ECG of 06-Jan-2022 13:11, Septal infarct is now Present Confirmed by Weston Brass ([id number]) on 09/28/2023 8:47:07 AM    Results   LABS Cholesterol: LDL 97 mg/dL (May 5284) Complete blood count: Normal Renal function: Normal Electrolytes: Normal Hepatic function: Normal Platelet count: Normal Thyroid function tests: Normal (May 2024)  RADIOLOGY CT scan: Medium-sized hernia personally reviewed (09/10/2023)  DIAGNOSTIC Echocardiography: Normal (July 2024)     Risk Assessment/Calculations:             Physical Exam:   VS:  BP 124/82 (BP Location: Left Arm, Patient Position: Sitting, Cuff Size: Normal)   Pulse 73   Ht 5\' 9"  (1.753 m)   Wt 214 lb 9.6 oz (97.3 kg)   LMP 09/09/2014 (Approximate)   SpO2 99%   BMI 31.69 kg/m    Wt Readings from Last 3 Encounters:  09/28/23  214 lb 9.6 oz (97.3 kg)  09/14/23 212 lb 11.2 oz (96.5 kg)  08/02/23 213 lb 12.8 oz (97 kg)     Physical Exam   CHEST: Decreased breath sounds on the left side. CARDIOVASCULAR: Heart sounds normal upon auscultation.     GEN: Well nourished, well developed in no acute distress NECK: No JVD; No carotid bruits CARDIAC: RRR, no murmurs, rubs, gallops RESPIRATORY:  Clear to  auscultation without rales, wheezing or rhonchi  ABDOMEN: Soft, non-tender, non-distended EXTREMITIES:  No edema; No deformity   ASSESSMENT AND PLAN: .    1. Takotsubo cardiomyopathy   2. Dyspnea on exertion   3. Chronic obstructive pulmonary disease, unspecified COPD type (HCC)   4. Pure hypercholesterolemia     Assessment and Plan    Chronic Obstructive Pulmonary Disease (COPD) Persistent shortness of breath, especially with exertion. No recent exacerbations. Patient had lung surgery two years ago and has since been deconditioned. -Encouraged to gradually increase physical activity as tolerated to improve conditioning and functional capacity.  Hyperlipidemia Last LDL was 97 on Atorvastatin 10mg  daily. -Continue Atorvastatin 10mg  daily. -Encouraged to maintain a healthy diet and increase physical activity to further lower LDL levels.  Hiatal Hernia Patient reports frequent heartburn, hernia noted on recent CT scan.  Cardiac Health Prior concern for Takotsubo cardiomyopathy, but recent echocardiogram showed normal heart function. Septal infarct on recent EKG likely due to technique, but patient should monitor for worsening shortness of breath or new onset chest discomfort. None currently. -Continue current cardiac medications and management. -Return for follow-up in one year.       Total time of encounter: 25 minutes total time of encounter, including 15 minutes spent in face-to-face patient care on the date of this encounter. This time includes coordination of care and counseling regarding above mentioned problem list. Remainder of non-face-to-face time involved reviewing chart documents/testing relevant to the patient encounter and documentation in the medical record. I have independently reviewed documentation from referring provider.   Weston Brass, MD, [facility] [place]  [facility]

## 2023-11-04 ENCOUNTER — Encounter (HOSPITAL_BASED_OUTPATIENT_CLINIC_OR_DEPARTMENT_OTHER): Payer: Self-pay | Admitting: Pulmonary Disease

## 2023-11-04 ENCOUNTER — Telehealth (HOSPITAL_COMMUNITY): Payer: Self-pay

## 2023-11-04 ENCOUNTER — Ambulatory Visit (HOSPITAL_BASED_OUTPATIENT_CLINIC_OR_DEPARTMENT_OTHER): Payer: Commercial Managed Care - HMO | Admitting: Pulmonary Disease

## 2023-11-04 ENCOUNTER — Encounter (HOSPITAL_COMMUNITY): Payer: Self-pay

## 2023-11-04 VITALS — BP 106/80 | HR 76 | Resp 16 | Ht 69.0 in | Wt 218.8 lb

## 2023-11-04 DIAGNOSIS — Z87891 Personal history of nicotine dependence: Secondary | ICD-10-CM | POA: Diagnosis not present

## 2023-11-04 DIAGNOSIS — C3412 Malignant neoplasm of upper lobe, left bronchus or lung: Secondary | ICD-10-CM | POA: Diagnosis not present

## 2023-11-04 DIAGNOSIS — R0602 Shortness of breath: Secondary | ICD-10-CM

## 2023-11-04 DIAGNOSIS — J449 Chronic obstructive pulmonary disease, unspecified: Secondary | ICD-10-CM | POA: Diagnosis not present

## 2023-11-04 DIAGNOSIS — J441 Chronic obstructive pulmonary disease with (acute) exacerbation: Secondary | ICD-10-CM

## 2023-11-04 MED ORDER — ALBUTEROL SULFATE HFA 108 (90 BASE) MCG/ACT IN AERS
2.0000 | INHALATION_SPRAY | RESPIRATORY_TRACT | 0 refills | Status: AC | PRN
Start: 2023-11-04 — End: ?

## 2023-11-04 MED ORDER — UMECLIDINIUM-VILANTEROL 62.5-25 MCG/ACT IN AEPB: 1.0000 | INHALATION_SPRAY | Freq: Every day | RESPIRATORY_TRACT | 11 refills | Status: DC

## 2023-11-04 NOTE — Telephone Encounter (Signed)
Attempted to call patient in regards to Pulmonary Rehab - LM on VM Mailed letter 

## 2023-11-04 NOTE — Patient Instructions (Signed)
COPD  --CONTINUE Anoro ONE puff ONCE a day. REFILL one year --CONTINUE Albuterol AS NEEDED for shortness of breath or wheezing --RE-REFER to Pulmonary Rehab  Stage IA lung adenocarcinoma s/p LUL lobectomy --Reviewed PFTs

## 2023-11-04 NOTE — Progress Notes (Signed)
Subjective:   PATIENT ID: Caitlin [name] GENDER: female DOB: 09/19/1966, MRN: [id number]  Chief Complaint  Patient presents with   Follow-up    Patient states that as long as she isnt doing anything breathing is ok.     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  Initial consult She was previously seen by Pulmonary and CT surgery for LUL nodule and underwent LUL lobectomy on 01/08/23. Since the surgery she has had shortness of breath with exertion that worsen with uphill and stairs. Carrying her grandson Difficulty with speed walking. Denies coughing and wheezing. Not active baseline. She is slowly increasing to up to mile on some days. She tries not to use her albuterol inhaler very often due to the cost.   11/04/23 Since our last visit she reports starting her pulmonary rehab however had to pause due to her grandson being in the NICU. He is healthy and went from 2lbs to 20lbs now. She continues to have shortness of breath with activity including making her bed or using upper body muscles. Denies coughing or wheezing. No illnesses in 2024.  Social History: Former smoker 1 ppd x 30 years  Past Medical History:  Diagnosis Date   CHF (congestive heart failure) (HCC)    COPD (chronic obstructive pulmonary disease) (HCC)    HLD (hyperlipidemia)    tx w/atorvastatin   NSTEMI (non-ST elevated myocardial infarction) (HCC)    pt denies   Takotsubo cardiomyopathy      Family History  Problem Relation Age of Onset   Heart disease Mother        age 35's   Hypertension Mother    Pancreatic cancer Mother    Aneurysm Father        brain   Colon cancer Neg Hx    Colon polyps Neg Hx    Esophageal cancer Neg Hx    Rectal cancer Neg Hx     Stomach cancer Neg Hx      Social History   Occupational History   Occupation: CNA  Tobacco Use   Smoking status: Former    Current packs/day: 0.00    Types: Cigarettes    Quit date: 08/16/2017    Years since quitting: 6.2   Smokeless tobacco: Never   Tobacco comments:    quit 07/12/17, abstaining 8/24  Vaping Use   Vaping status: Never Used  Substance and Sexual Activity   Alcohol use: Yes    Comment: occasionally   Drug use: Not Currently    Comment: Occasional marijuana quit 6 months ago    Sexual activity: Yes    Birth control/protection: None    Allergies  Allergen Reactions   Aspirin Nausea Only   Tramadol Rash     Outpatient Medications Prior to Visit  Medication Sig Dispense Refill   atorvastatin (LIPITOR) 10 MG tablet Take 1 tablet (10 mg total) by mouth daily. 90 tablet 1   METAMUCIL FIBER PO Take 3 tablets by mouth daily.     Omega-3 Fatty Acids (FISH OIL PO) Take 1 capsule by mouth daily.     Vitamin D, Ergocalciferol, (DRISDOL) 1.25 MG (50000 UNIT) CAPS capsule Take 1 capsule (50,000 Units total) by mouth every 7 (seven) days. 12 capsule 0   albuterol (VENTOLIN  HFA) 108 (90 Base) MCG/ACT inhaler Inhale 2 puffs into the lungs every 4 (four) hours as needed for wheezing or shortness of breath. 18 g 3   umeclidinium-vilanterol (ANORO ELLIPTA) 62.5-25 MCG/ACT AEPB Inhale 1 puff into the lungs daily at 6 (six) AM. 60 each 6   No facility-administered medications prior to visit.    Review of Systems  Constitutional:  Negative for chills, diaphoresis, fever, malaise/fatigue and weight loss.  HENT:  Negative for congestion.   Respiratory:  Positive for shortness of breath. Negative for cough, hemoptysis, sputum production and wheezing.   Cardiovascular:  Negative for chest pain, palpitations and leg swelling.     Objective:   Vitals:   11/04/23 0929  BP: 106/80  Pulse: 76  Resp: 16  SpO2: 97%  Weight: 218 lb 12.8 oz (99.2 kg)  Height: 5\' 9"  (1.753 m)    SpO2: 97 %  Physical Exam: General: Well-appearing, no acute distress HENT: [place], AT Eyes: EOMI, no scleral icterus Respiratory: Clear to auscultation bilaterally.  No crackles, wheezing or rales Cardiovascular: RRR, -M/R/G, no JVD Extremities:-Edema,-tenderness Neuro: AAO x4, CNII-XII grossly intact Psych: Normal mood, normal affect   Data Reviewed:  Imaging: CT Chest 03/10/23 - S/p LUL lobectomy. No recurrent lung disease  PFT: 12/08/22 FVC 3.11 (93%) FEV1 1.95 (74%) Ratio 63  TLC 114% DLCO 56% Interpretation: Mild obstructive defect with air trapping and hyperinflation and reduced DLCO consistent with emphysema  08/02/23 FVC 2.87 (72%) FEV1 1.67 (54%) Ratio 56  TLC 93% DLCO 62% Interpretation: Moderately severe obstructive defect with mildly reduced DLCO   Labs:    Latest Ref Rng & Units 09/10/2023   10:23 AM 04/06/2023    8:31 AM 03/10/2023   10:18 AM  CBC  WBC 4.0 - 10.5 K/uL 6.5  5.0  5.8   Hemoglobin 12.0 - 15.0 g/dL 16.1  09.6  04.5   Hematocrit 36.0 - 46.0 % 40.2  43.7  39.8   Platelets 150 - 400 K/uL 331  318  335         Assessment & Plan:   Discussion: 57 year old female former smoker with 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 Reviewed PFTs and worsening obstruction. Encouraged ongoing bronchodilators. Discussed clinical course and management of COPD including bronchodilator regimen, preventive care and action plan for exacerbation. Discussed benefits of pulmonary rehab and will re-enroll  COPD  --CONTINUE Anoro ONE puff ONCE a day. 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  Health Maintenance Immunization History  Administered Date(s) Administered    Fluad Quad(high Dose 65+) 11/11/2020   Influenza Inj Mdck Quad Pf 08/04/2021   Influenza,inj,Quad PF,6+ Mos 10/22/2017, 11/22/2018, 07/18/2019   Influenza-Unspecified 11/22/2018, 08/16/2023   PFIZER(Purple Top)SARS-COV-2 Vaccination 02/03/2020, 02/26/2020, 10/17/2020   PNEUMOCOCCAL CONJUGATE-20 09/24/2021   Pfizer Covid-19 Vaccine Bivalent Booster 13yrs & up 09/16/2021   Pneumococcal Polysaccharide-23 11/22/2018   Tdap 04/16/2020   CT Lung Screen - per Oncology  Orders Placed This Encounter  Procedures   AMB referral to pulmonary rehabilitation    Referral Priority:   Routine    Referral Type:   Consultation    Number of Visits Requested:   1   Meds ordered this encounter  Medications   umeclidinium-vilanterol (ANORO  ELLIPTA) 62.5-25 MCG/ACT AEPB    Sig: Inhale 1 puff into the lungs daily at 6 (six) AM.    Dispense:  60 each    Refill:  11   albuterol (VENTOLIN HFA) 108 (90 Base) MCG/ACT inhaler    Sig: Inhale 2 puffs into the lungs every 4 (four) hours as needed for wheezing or shortness of breath.    Dispense:  8 g    Refill:  0    Return in about 10 months (around 09/03/2024).  I have spent a total time of 35-minutes on the day of the appointment reviewing prior documentation, coordinating care and discussing medical diagnosis and plan with the patient/family. Imaging, labs and tests included in this note have been reviewed and interpreted independently by me.  Nazier Neyhart Mechele Collin, MD [facility] 11/04/2023 12:20 PM  Office Number [phone]

## 2023-11-15 ENCOUNTER — Telehealth (HOSPITAL_COMMUNITY): Payer: Self-pay

## 2023-11-15 NOTE — Telephone Encounter (Signed)
Patient will come in for orientation on 11/19/23 @ 1:30PM and will attend the 8:15AM exercise class.   Pensions consultant.

## 2023-11-19 ENCOUNTER — Telehealth (HOSPITAL_COMMUNITY): Payer: Self-pay

## 2023-11-19 ENCOUNTER — Encounter (HOSPITAL_COMMUNITY): Payer: Self-pay

## 2023-11-19 ENCOUNTER — Encounter (HOSPITAL_COMMUNITY)
Admission: RE | Admit: 2023-11-19 | Discharge: 2023-11-19 | Disposition: A | Discharge status: Home / Self Care | Payer: Medicaid Other | Source: Ambulatory Visit | Attending: Pulmonary Disease | Admitting: Pulmonary Disease

## 2023-11-19 VITALS — BP 132/70 | HR 92 | Wt 220.9 lb

## 2023-11-19 DIAGNOSIS — J449 Chronic obstructive pulmonary disease, unspecified: Secondary | ICD-10-CM | POA: Insufficient documentation

## 2023-11-19 NOTE — Progress Notes (Signed)
 Pulmonary Rehab Orientation Physical Assessment Note  Physical assessment reveals patient is alert and oriented x 4, normal mood and affect. Heart rate is normal, breath sounds diminished throughout, rhonchi LUL. Pt endorses dyspnea. Pt denies chronic cough. Bowel sounds present x4 quads. Pt denies abdominal discomfort, nausea, vomiting, diarrhea or constipation. Grip strength equal, strong. Distal pulses +2; no swelling to lower extremities.   Ronal Levin, RN, BSN

## 2023-11-19 NOTE — Telephone Encounter (Signed)
 Attempted to call patient in regards to updated insurance. LMTCB

## 2023-11-19 NOTE — Progress Notes (Signed)
 Caitlin [name] 58 y.o. female Pulmonary Rehab Orientation Note This patient who was referred to Pulmonary Rehab by Dr. Kassie with the diagnosis of COPD 2 arrived today in Cardiac and Pulmonary Rehab. She  arrived ambulatory with normal gait. She  does not carry portable oxygen.  Per patient, Caitlin [name] uses oxygen never. Color good, skin warm and dry. Patient is oriented to time and place. Patient's medical history, psychosocial health, and medications reviewed. Psychosocial assessment reveals patient lives with alone. 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 Patient hobbies include spending time with others and her grandson. Patient reports her stress level is low. Areas of stress/anxiety include health. Patient does not exhibit signs of depression. PHQ2/9 score 0/2. Caitlin [name] shows good  coping skills with positive outlook on life. Offered emotional support and reassurance. Will continue to monitor. Physical assessment performed by Nurse pick: Caitlin Levin RN. Please see their orientation physical assessment note. Caitlin [name] reports she does take medications as prescribed. Patient states she follows a regular  diet. The patient reports no specific efforts to gain or lose weight.. Patient's weight will be monitored closely. Demonstration and practice of PLB using pulse oximeter. Caitlin [name] able to return demonstration satisfactorily. Safety and hand hygiene in the exercise area reviewed with patient. Caitlin [name] voices understanding of the information reviewed. Department expectations discussed with patient and achievable goals were set. The patient shows enthusiasm about attending the program and we look forward to working with Caitlin. Caitlin [name] completed a 6 min walk test today and is scheduled to begin exercise on 11/23/23 @ 8:15.   [id number] Caitlin [name] Sharps, BSRT

## 2023-11-19 NOTE — Progress Notes (Signed)
 Caitlin [name] 58 y.o. female  Initial Psychosocial Assessment  Pt psychosocial assessment reveals pt lives alone. Pt is currently part time job doing CNA work. Pt hobbies include spending time with others and her grandson. Pt reports her stress level is low. Areas of stress/anxiety include health.  Pt does not exhibit signs of depression. Pt shows good  coping skills with positive outlook . Offered emotional support and reassurance. Monitor and evaluate progress toward psychosocial goal(s).  Goal(s): Improved management of stress Improved coping skills Help patient work toward returning to meaningful activities that improve patient's QOL and are attainable with patient's lung disease   11/19/2023 1:59 PM

## 2023-11-19 NOTE — Progress Notes (Signed)
 Pulmonary Individual Treatment Plan  Patient Details  Name: Caitlin [name] MRN: [id number] Date of Birth: 02/13/1966 Referring Provider:   Conrad Ports Pulmonary Rehab Walk Test from 11/19/2023 in [facility]  Referring Provider Kassie       Initial Encounter Date:  Flowsheet Row Pulmonary Rehab Walk Test from 11/19/2023 in [facility]  Date 11/19/23       Visit Diagnosis: Stage 2 moderate COPD by GOLD classification (HCC)  Patient's Home Medications on Admission:   Current Outpatient Medications:    albuterol  (VENTOLIN  HFA) 108 (90 Base) MCG/ACT inhaler, Inhale 2 puffs into the lungs every 4 (four) hours as needed for wheezing or shortness of breath., Disp: 8 g, Rfl: 0   atorvastatin  (LIPITOR) 10 MG tablet, Take 1 tablet (10 mg total) by mouth daily., Disp: 90 tablet, Rfl: 1   METAMUCIL FIBER PO, Take 3 tablets by mouth daily., Disp: , Rfl:    Omega-3 Fatty Acids (FISH OIL PO), Take 1 capsule by mouth daily., Disp: , Rfl:    umeclidinium-vilanterol (ANORO ELLIPTA ) 62.5-25 MCG/ACT AEPB, Inhale 1 puff into the lungs daily at 6 (six) AM., Disp: 60 each, Rfl: 11   Vitamin D , Ergocalciferol , (DRISDOL ) 1.25 MG (50000 UNIT) CAPS capsule, Take 1 capsule (50,000 Units total) by mouth every 7 (seven) days., Disp: 12 capsule, Rfl: 0  Past Medical History: Past Medical History:  Diagnosis Date   CHF (congestive heart failure) (HCC)    COPD (chronic obstructive pulmonary disease) (HCC)    HLD (hyperlipidemia)    tx w/atorvastatin    NSTEMI (non-ST elevated myocardial infarction) (HCC)    pt denies   Takotsubo cardiomyopathy     Tobacco Use: Social History   Tobacco Use  Smoking Status Former   Current packs/day: 0.00   Types: Cigarettes   Quit date: 08/16/2017   Years since quitting: 6.2  Smokeless Tobacco Never  Tobacco Comments   quit 07/12/17, abstaining 8/24     Labs: Review Flowsheet  More data exists      Latest Ref Rng & Units 04/16/2020 03/17/2021 01/06/2022 03/24/2022 04/06/2023  Labs for ITP Cardiac and Pulmonary Rehab  Cholestrol 100 - 199 mg/dL 796  781  - 839  844   LDL (calc) 0 - 99 mg/dL 865  839  - 98  97   HDL-C >39 mg/dL 52  45  - 43  38   Trlycerides 0 - 149 mg/dL 95  73  - 897  889   Hemoglobin A1c 4.8 - 5.6 % - - - - 6.1   PH, Arterial 7.35 - 7.45 - - 7.42  - -  PCO2 arterial 32 - 48 mmHg - - 37  - -  Bicarbonate 20.0 - 28.0 mmol/L - - 24.0  - -  Acid-base deficit 0.0 - 2.0 mmol/L - - 0.2  - -  O2 Saturation % - - 98.9  - -    Capillary Blood Glucose: Lab Results  Component Value Date   GLUCAP 79 12/16/2021   GLUCAP 94 09/28/2017   GLUCAP 95 09/28/2017   GLUCAP 108 (H) 09/27/2017   GLUCAP 89 09/27/2017     Pulmonary Assessment Scores:  Pulmonary Assessment Scores     Row Name 11/19/23 1354         ADL UCSD   ADL Phase Entry     SOB Score total 36  CAT Score   CAT Score 12       mMRC Score   mMRC Score 2             UCSD: Self-administered rating of dyspnea associated with activities of daily living (ADLs) 6-point scale (0 = not at all to 5 = maximal or unable to do because of breathlessness)  Scoring Scores range from 0 to 120.  Minimally important difference is 5 units  CAT: CAT can identify the health impairment of COPD patients and is better correlated with disease progression.  CAT has a scoring range of zero to 40. The CAT score is classified into four groups of low (less than 10), medium (10 - 20), high (21-30) and very high (31-40) based on the impact level of disease on health status. A CAT score over 10 suggests significant symptoms.  A worsening CAT score could be explained by an exacerbation, poor medication adherence, poor inhaler technique, or progression of COPD or comorbid conditions.  CAT MCID is 2 points  mMRC: mMRC (Modified Medical Research Council) Dyspnea Scale is used  to assess the degree of baseline functional disability in patients of respiratory disease due to dyspnea. No minimal important difference is established. A decrease in score of 1 point or greater is considered a positive change.   Pulmonary Function Assessment:  Pulmonary Function Assessment - 11/19/23 1354       Breath   Bilateral Breath Sounds Clear;Decreased    Shortness of Breath Yes;Limiting activity             Exercise Target Goals: Exercise Program Goal: Individual exercise prescription set using results from initial 6 min walk test and THRR while considering  patient's activity barriers and safety.   Exercise Prescription Goal: Initial exercise prescription builds to 30-45 minutes a day of aerobic activity, 2-3 days per week.  Home exercise guidelines will be given to patient during program as part of exercise prescription that the participant will acknowledge.  Activity Barriers & Risk Stratification:  Activity Barriers & Cardiac Risk Stratification - 11/19/23 1340       Activity Barriers & Cardiac Risk Stratification   Activity Barriers Deconditioning;Muscular Weakness;Shortness of Breath;Arthritis    Cardiac Risk Stratification Moderate             6 Minute Walk:  6 Minute Walk     Row Name 11/19/23 1421         6 Minute Walk   Phase Initial     Distance 1442 feet     Walk Time 6 minutes     # of Rest Breaks 0     MPH 2.73     METS 3.02     RPE 11     Perceived Dyspnea  1     VO2 Peak 10.56     Symptoms No     Resting HR 92 bpm     Resting BP 132/70     Resting Oxygen Saturation  99 %     Exercise Oxygen Saturation  during 6 min walk 94 %     Max Ex. HR 100 bpm     Max Ex. BP 144/80     2 Minute Post BP 120/70       Interval HR   1 Minute HR 92     2 Minute HR 98     3 Minute HR 98     4 Minute HR 99     5 Minute HR 100  6 Minute HR 96     2 Minute Post HR 84     Interval Heart Rate? Yes       Interval Oxygen   Interval Oxygen?  Yes     Baseline Oxygen Saturation % 99 %     1 Minute Oxygen Saturation % 99 %     1 Minute Liters of Oxygen 0 L     2 Minute Oxygen Saturation % 96 %     2 Minute Liters of Oxygen 0 L     3 Minute Oxygen Saturation % 96 %     3 Minute Liters of Oxygen 0 L     4 Minute Oxygen Saturation % 95 %     4 Minute Liters of Oxygen 0 L     5 Minute Oxygen Saturation % 94 %     5 Minute Liters of Oxygen 0 L     6 Minute Oxygen Saturation % 96 %     6 Minute Liters of Oxygen 0 L     2 Minute Post Oxygen Saturation % 99 %     2 Minute Post Liters of Oxygen 0 L              Oxygen Initial Assessment:  Oxygen Initial Assessment - 11/19/23 1341       Home Oxygen   Home Oxygen Device None    Sleep Oxygen Prescription None    Home Exercise Oxygen Prescription None    Home Resting Oxygen Prescription None      Initial 6 min Walk   Oxygen Used None      Program Oxygen Prescription   Program Oxygen Prescription None      Intervention   Short Term Goals To learn and understand importance of monitoring SPO2 with pulse oximeter and demonstrate accurate use of the pulse oximeter.;To learn and understand importance of maintaining oxygen saturations>88%;To learn and demonstrate proper pursed lip breathing techniques or other breathing techniques. ;To learn and demonstrate proper use of respiratory medications    Long  Term Goals Maintenance of O2 saturations>88%;Compliance with respiratory medication;Verbalizes importance of monitoring SPO2 with pulse oximeter and return demonstration;Exhibits proper breathing techniques, such as pursed lip breathing or other method taught during program session;Demonstrates proper use of MDI's             Oxygen Re-Evaluation:   Oxygen Discharge (Final Oxygen Re-Evaluation):   Initial Exercise Prescription:  Initial Exercise Prescription - 11/19/23 1400       Date of Initial Exercise RX and Referring Provider   Date 11/19/23    Referring Provider  Kassie    Expected Discharge Date 02/10/24      Treadmill   MPH 2.5    Grade 1    Minutes 15    METs 3      Elliptical   Level 1    Speed 1    Minutes 15    METs 3      Prescription Details   Frequency (times per week) 2    Duration Progress to 30 minutes of continuous aerobic without signs/symptoms of physical distress      Intensity   THRR 40-80% of Max Heartrate 64-129    Ratings of Perceived Exertion 11-13    Perceived Dyspnea 0-4      Progression   Progression Continue to progress workloads to maintain intensity without signs/symptoms of physical distress.      Resistance Training   Training Prescription Yes  Weight black bands    Reps 10-15             Perform Capillary Blood Glucose checks as needed.  Exercise Prescription Changes:   Exercise Comments:   Exercise Goals and Review:   Exercise Goals     Row Name 11/19/23 1340             Exercise Goals   Increase Physical Activity Yes       Intervention Provide advice, education, support and counseling about physical activity/exercise needs.;Develop an individualized exercise prescription for aerobic and resistive training based on initial evaluation findings, risk stratification, comorbidities and participant's personal goals.       Expected Outcomes Short Term: Attend rehab on a regular basis to increase amount of physical activity.;Long Term: Add in home exercise to make exercise part of routine and to increase amount of physical activity.;Long Term: Exercising regularly at least 3-5 days a week.       Increase Strength and Stamina Yes       Intervention Provide advice, education, support and counseling about physical activity/exercise needs.;Develop an individualized exercise prescription for aerobic and resistive training based on initial evaluation findings, risk stratification, comorbidities and participant's personal goals.       Expected Outcomes Short Term: Increase workloads from  initial exercise prescription for resistance, speed, and METs.;Short Term: Perform resistance training exercises routinely during rehab and add in resistance training at home;Long Term: Improve cardiorespiratory fitness, muscular endurance and strength as measured by increased METs and functional capacity ( )       Able to understand and use rate of perceived exertion (RPE) scale Yes       Intervention Provide education and explanation on how to use RPE scale       Expected Outcomes Short Term: Able to use RPE daily in rehab to express subjective intensity level;Long Term:  Able to use RPE to guide intensity level when exercising independently       Able to understand and use Dyspnea scale Yes       Intervention Provide education and explanation on how to use Dyspnea scale       Expected Outcomes Short Term: Able to use Dyspnea scale daily in rehab to express subjective sense of shortness of breath during exertion;Long Term: Able to use Dyspnea scale to guide intensity level when exercising independently       Knowledge and understanding of Target Heart Rate Range (THRR) Yes       Intervention Provide education and explanation of THRR including how the numbers were predicted and where they are located for reference       Expected Outcomes Short Term: Able to state/look up THRR;Long Term: Able to use THRR to govern intensity when exercising independently;Short Term: Able to use daily as guideline for intensity in rehab       Understanding of Exercise Prescription Yes       Intervention Provide education, explanation, and written materials on patient's individual exercise prescription       Expected Outcomes Short Term: Able to explain program exercise prescription;Long Term: Able to explain home exercise prescription to exercise independently                Exercise Goals Re-Evaluation :   Discharge Exercise Prescription (Final Exercise Prescription Changes):   Nutrition:  Target Goals:  Understanding of nutrition guidelines, daily intake of sodium 1500mg , cholesterol 200mg , calories 30% from fat and 7% or less from saturated fats, daily to have 5  or more servings of fruits and vegetables.  Biometrics:  Pre Biometrics - 11/19/23 1415       Pre Biometrics   Grip Strength 40 kg              Nutrition Therapy Plan and Nutrition Goals:   Nutrition Assessments:  MEDIFICTS Score Key: >=70 Need to make dietary changes  40-70 Heart Healthy Diet <= 40 Therapeutic Level Cholesterol Diet   Picture Your Plate Scores: <59 Unhealthy dietary pattern with much room for improvement. 41-50 Dietary pattern unlikely to meet recommendations for good health and room for improvement. 51-60 More healthful dietary pattern, with some room for improvement.  >60 Healthy dietary pattern, although there may be some specific behaviors that could be improved.    Nutrition Goals Re-Evaluation:   Nutrition Goals Discharge (Final Nutrition Goals Re-Evaluation):   Psychosocial: Target Goals: Acknowledge presence or absence of significant depression and/or stress, maximize coping skills, provide positive support system. Participant is able to verbalize types and ability to use techniques and skills needed for reducing stress and depression.  Initial Review & Psychosocial Screening:  Initial Psych Review & Screening - 11/19/23 1338       Initial Review   Current issues with None Identified      Family Dynamics   Good Support System? Yes      Barriers   Psychosocial barriers to participate in program There are no identifiable barriers or psychosocial needs.      Screening Interventions   Interventions Encouraged to exercise    Expected Outcomes Short Term goal: Utilizing psychosocial counselor, staff and physician to assist with identification of specific Stressors or current issues interfering with healing process. Setting desired goal for each stressor or current issue  identified.;Long Term Goal: Stressors or current issues are controlled or eliminated.;Short Term goal: Identification and review with participant of any Quality of Life or Depression concerns found by scoring the questionnaire.;Long Term goal: The participant improves quality of Life and PHQ9 Scores as seen by post scores and/or verbalization of changes             Quality of Life Scores:  Scores of 19 and below usually indicate a poorer quality of life in these areas.  A difference of  2-3 points is a clinically meaningful difference.  A difference of 2-3 points in the total score of the Quality of Life Index has been associated with significant improvement in overall quality of life, self-image, physical symptoms, and general health in studies assessing change in quality of life.  PHQ-9: Review Flowsheet  More data exists      11/19/2023 06/18/2023 07/23/2022 03/24/2022 09/24/2021  Depression screen PHQ 2/9  Decreased Interest 0 1 0 0 0  Down, Depressed, Hopeless 0 0 0 0 0  PHQ - 2 Score 0 1 0 0 0  Altered sleeping 0 1 0 0 -  Tired, decreased energy 2 1 2  0 -  Change in appetite 0 0 0 0 -  Feeling bad or failure about yourself  0 0 0 0 -  Trouble concentrating 0 0 0 0 -  Moving slowly or fidgety/restless 0 0 0 0 -  Suicidal thoughts 0 0 0 0 -  PHQ-9 Score 2 3 2  0 -  Difficult doing work/chores Not difficult at all Somewhat difficult Not difficult at all Not difficult at all -   Interpretation of Total Score  Total Score Depression Severity:  1-4 = Minimal depression, 5-9 = Mild depression, 10-14 = Moderate  depression, 15-19 = Moderately severe depression, 20-27 = Severe depression   Psychosocial Evaluation and Intervention:  Psychosocial Evaluation - 11/19/23 1338       Psychosocial Evaluation & Interventions   Interventions Encouraged to exercise with the program and follow exercise prescription    Comments Kassidee denies any psychsocial barriers or concerns at this time.     Expected Outcomes For pt to participate in PR free of any psychosocial barriers or concerns.    Continue Psychosocial Services  No Follow up required             Psychosocial Re-Evaluation:   Psychosocial Discharge (Final Psychosocial Re-Evaluation):   Education: Education Goals: Education classes will be provided on a weekly basis, covering required topics. Participant will state understanding/return demonstration of topics presented.  Learning Barriers/Preferences:  Learning Barriers/Preferences - 11/19/23 1339       Learning Barriers/Preferences   Learning Barriers None    Learning Preferences None             Education Topics: Know Your Numbers Group instruction that is supported by a PowerPoint presentation. Instructor discusses importance of knowing and understanding resting, exercise, and post-exercise oxygen saturation, heart rate, and blood pressure. Oxygen saturation, heart rate, blood pressure, rating of perceived exertion, and dyspnea are reviewed along with a normal range for these values.    Exercise for the Pulmonary Patient Group instruction that is supported by a PowerPoint presentation. Instructor discusses benefits of exercise, core components of exercise, frequency, duration, and intensity of an exercise routine, importance of utilizing pulse oximetry during exercise, safety while exercising, and options of places to exercise outside of rehab.    MET Level  Group instruction provided by PowerPoint, verbal discussion, and written material to support subject matter. Instructor reviews what METs are and how to increase METs.    Pulmonary Medications Verbally interactive group education provided by instructor with focus on inhaled medications and proper administration. Flowsheet Row PULMONARY REHAB CHRONIC OBSTRUCTIVE PULMONARY DISEASE from 08/05/2023 in [facility]  Date 08/05/23  Educator RT   Instruction Review Code 1- Verbalizes Understanding       Anatomy and Physiology of the Respiratory System Group instruction provided by PowerPoint, verbal discussion, and written material to support subject matter. Instructor reviews respiratory cycle and anatomical components of the respiratory system and their functions. Instructor also reviews differences in obstructive and restrictive respiratory diseases with examples of each.  Flowsheet Row PULMONARY REHAB CHRONIC OBSTRUCTIVE PULMONARY DISEASE from 07/29/2023 in [facility] for Heart, Vascular, & Lung Health  Date 07/29/23  Educator RT  Instruction Review Code 1- Verbalizes Understanding       Oxygen Safety Group instruction provided by PowerPoint, verbal discussion, and written material to support subject matter. There is an overview of "What is Oxygen" and "Why do we need it".  Instructor also reviews how to create a safe environment for oxygen use, the importance of using oxygen as prescribed, and the risks of noncompliance. There is a brief discussion on traveling with oxygen and resources the patient may utilize.   Oxygen Use Group instruction provided by PowerPoint, verbal discussion, and written material to discuss how supplemental oxygen is prescribed and different types of oxygen supply systems. Resources for more information are provided.    Breathing Techniques Group instruction that is supported by demonstration and informational handouts. Instructor discusses the benefits of pursed lip and diaphragmatic breathing and detailed demonstration on how to  perform both.     Risk Factor Reduction Group instruction that is supported by a PowerPoint presentation. Instructor discusses the definition of a risk factor, different risk factors for pulmonary disease, and how the heart and lungs work together.   Pulmonary Diseases Group instruction provided by PowerPoint, verbal discussion, and written  material to support subject matter. Instructor gives an overview of the different type of pulmonary diseases. There is also a discussion on risk factors and symptoms as well as ways to manage the diseases.   Stress and Energy Conservation Group instruction provided by PowerPoint, verbal discussion, and written material to support subject matter. Instructor gives an overview of stress and the impact it can have on the body. Instructor also reviews ways to reduce stress. There is also a discussion on energy conservation and ways to conserve energy throughout the day.   Warning Signs and Symptoms Group instruction provided by PowerPoint, verbal discussion, and written material to support subject matter. Instructor reviews warning signs and symptoms of stroke, heart attack, cold and flu. Instructor also reviews ways to prevent the spread of infection.   Other Education Group or individual verbal, written, or video instructions that support the educational goals of the pulmonary rehab program. Flowsheet Row PULMONARY REHAB CHRONIC OBSTRUCTIVE PULMONARY DISEASE from 07/22/2023 in [facility] Heart, Vascular, & Lung Health  Date 07/22/23  Educator RT  Instruction Review Code 1- Verbalizes Understanding        Knowledge Questionnaire Score:  Knowledge Questionnaire Score - 11/19/23 1331       Knowledge Questionnaire Score   Pre Score 15/18             Core Components/Risk Factors/Patient Goals at Admission:  Personal Goals and Risk Factors at Admission - 11/19/23 1339       Core Components/Risk Factors/Patient Goals on Admission   Improve shortness of breath with ADL's Yes    Intervention Provide education, individualized exercise plan and daily activity instruction to help decrease symptoms of SOB with activities of daily living.    Expected Outcomes Short Term: Improve cardiorespiratory fitness to achieve a reduction of symptoms when performing ADLs;Long  Term: Be able to perform more ADLs without symptoms or delay the onset of symptoms    Increase knowledge of respiratory medications and ability to use respiratory devices properly  Yes    Intervention Provide education and demonstration as needed of appropriate use of medications, inhalers, and oxygen therapy.    Expected Outcomes Short Term: Achieves understanding of medications use. Understands that oxygen is a medication prescribed by physician. Demonstrates appropriate use of inhaler and oxygen therapy.;Long Term: Maintain appropriate use of medications, inhalers, and oxygen therapy.             Core Components/Risk Factors/Patient Goals Review:    Core Components/Risk Factors/Patient Goals at Discharge (Final Review):    ITP Comments:   Comments: Dr. Slater Staff is Medical Director for Pulmonary Rehab at [facility].

## 2023-11-19 NOTE — Telephone Encounter (Signed)
 Pt insurance is active and benefits verified through [facility]. Co-pay $4.00, DED $0.00/$0.00 met, out of pocket $0.00/$0.00 met, co-insurance 0%. No pre-authorization required. Becky[employer], 11/19/2023 @ 11:08AM, [phone]

## 2023-11-23 ENCOUNTER — Encounter (HOSPITAL_COMMUNITY)
Admission: RE | Admit: 2023-11-23 | Discharge: 2023-11-23 | Disposition: A | Discharge status: Home / Self Care | Payer: Medicaid Other | Source: Ambulatory Visit | Attending: Pulmonary Disease

## 2023-11-23 VITALS — Wt 219.6 lb

## 2023-11-23 DIAGNOSIS — J449 Chronic obstructive pulmonary disease, unspecified: Secondary | ICD-10-CM

## 2023-11-23 NOTE — Progress Notes (Signed)
 Daily Session Note  Patient Details  Name: Caitlin [name] MRN: [id number] Date of Birth: [birth year] Referring Provider:   Conrad Ports Pulmonary Rehab Walk Test from 11/19/2023 in [facility]  Referring Provider Kassie       Encounter Date: 11/23/2023  Check In:  Session Check In - 11/23/23 0820       Check-In   Supervising physician immediately available to respond to emergencies [facility] MD immediately available    Physician(s) Rosaline Bane, NP    Location [facility]    Staff Present Cloyd Aris BS, ACSM-CEP, Exercise Physiologist;Casey Claudene Neita Moats, MS, ACSM-CEP, Exercise Physiologist    Virtual Visit No    Medication changes reported     No    Fall or balance concerns reported    No    Tobacco Cessation No Change    Warm-up and Cool-down Performed as group-led instruction    Resistance Training Performed Yes    VAD Patient? No    PAD/SET Patient? No      Pain Assessment   Currently in Pain? No/denies    Multiple Pain Sites No             Capillary Blood Glucose: No results found for this or any previous visit (from the past 24 hours).   Exercise Prescription Changes - 11/23/23 0900       Response to Exercise   Blood Pressure (Admit) 130/70    Blood Pressure (Exercise) 158/80    Blood Pressure (Exit) 114/70    Heart Rate (Admit) 84 bpm    Heart Rate (Exercise) 131 bpm    Heart Rate (Exit) 94 bpm    Oxygen Saturation (Admit) 98 %    Oxygen Saturation (Exercise) 94 %    Oxygen Saturation (Exit) 99 %    Rating of Perceived Exertion (Exercise) 13    Perceived Dyspnea (Exercise) 2    Duration Progress to 30 minutes of  aerobic without signs/symptoms of physical distress    Intensity THRR unchanged      Progression   Progression Continue to progress workloads to maintain intensity without signs/symptoms of physical distress.      Resistance Training   Training Prescription  Yes    Weight black bands    Reps 10-15    Time 10 Minutes      Treadmill   MPH 2.5    Grade 1    Minutes 15    METs 3.1      Elliptical   Level 1    Speed 1    Minutes 15    METs 3.9             Social History   Tobacco Use  Smoking Status Former   Current packs/day: 0.00   Types: Cigarettes   Quit date: 08/16/2017   Years since quitting: 6.2  Smokeless Tobacco Never  Tobacco Comments   quit 07/12/17, abstaining 8/24    Goals Met:  Proper associated with RPD/PD & O2 Sat Exercise tolerated well No report of concerns or symptoms today Strength training completed today  Goals Unmet:  Not Applicable  Comments: Service time is from 0817 to 0920.    Dr. Slater Kassie is Medical Director for Pulmonary Rehab at [facility].

## 2023-11-25 ENCOUNTER — Encounter (HOSPITAL_COMMUNITY)
Admission: RE | Admit: 2023-11-25 | Discharge: 2023-11-25 | Disposition: A | Discharge status: Home / Self Care | Payer: Medicaid Other | Source: Ambulatory Visit | Attending: Pulmonary Disease | Admitting: Pulmonary Disease

## 2023-11-25 DIAGNOSIS — J449 Chronic obstructive pulmonary disease, unspecified: Secondary | ICD-10-CM

## 2023-11-25 NOTE — Progress Notes (Signed)
 Daily Session Note  Patient Details  Name: Caitlin [name] MRN: [id number] Date of Birth: 08/26/1966 Referring Provider:   Conrad Ports Pulmonary Rehab Walk Test from 11/19/2023 in [facility]  Referring Provider Kassie       Encounter Date: 11/25/2023  Check In:  Session Check In - 11/25/23 0831       Check-In   Supervising physician immediately available to respond to emergencies [facility] MD immediately available    Physician(s) Barnie Press, NP    [facility]    Staff Present Cloyd Aris BS, ACSM-CEP, Exercise Physiologist;Casey Claudene Neita Moats, MS, ACSM-CEP, Exercise Physiologist;Mary Harvy, RN, BSN    Virtual Visit No    Medication changes reported     No    Fall or balance concerns reported    No    Tobacco Cessation No Change    Warm-up and Cool-down Performed as group-led instruction    Resistance Training Performed Yes    VAD Patient? No    PAD/SET Patient? No      Pain Assessment   Currently in Pain? No/denies    Multiple Pain Sites No             Capillary Blood Glucose: No results found for this or any previous visit (from the past 24 hours).    Social History   Tobacco Use  Smoking Status Former   Current packs/day: 0.00   Types: Cigarettes   Quit date: 08/16/2017   Years since quitting: 6.2  Smokeless Tobacco Never  Tobacco Comments   quit 07/12/17, abstaining 8/24    Goals Met:  Proper associated with RPD/PD & O2 Sat Exercise tolerated well No report of concerns or symptoms today Strength training completed today  Goals Unmet:  Not Applicable  Comments: Service time is from 0818 to 0930.    Dr. Slater Kassie is Medical Director for Pulmonary Rehab at [facility].

## 2023-11-30 ENCOUNTER — Encounter (HOSPITAL_COMMUNITY)
Admission: RE | Admit: 2023-11-30 | Discharge: 2023-11-30 | Disposition: A | Discharge status: Home / Self Care | Payer: Medicaid Other | Source: Ambulatory Visit | Attending: Pulmonary Disease | Admitting: Pulmonary Disease

## 2023-11-30 DIAGNOSIS — J449 Chronic obstructive pulmonary disease, unspecified: Secondary | ICD-10-CM

## 2023-11-30 NOTE — Progress Notes (Signed)
 Daily Session Note  Patient Details  Name: Caitlin [name] MRN: [id number] Date of Birth: [birth year] Referring Provider:   Conrad Ports Pulmonary Rehab Walk Test from 11/19/2023 in [facility]  Referring Provider Kassie       Encounter Date: 11/30/2023  Check In:  Session Check In - 11/30/23 0817       Check-In   Supervising physician immediately available to respond to emergencies [facility] immediately available    Physician(s) Lamarr Satterfield, NP    [facility]    Staff Present Cloyd Aris BS, ACSM-CEP, Exercise Physiologist;Ellaina Schuler Claudene Neita Moats, MS, ACSM-CEP, Exercise Physiologist    Virtual Visit No    Medication changes reported     No    Fall or balance concerns reported    No    Tobacco Cessation No Change    Warm-up and Cool-down Performed as group-led instruction    Resistance Training Performed Yes    VAD Patient? No    PAD/SET Patient? No      Pain Assessment   Currently in Pain? No/denies    Multiple Pain Sites No             Capillary Blood Glucose: No results found for this or any previous visit (from the past 24 hours).    Social History   Tobacco Use  Smoking Status Former   Current packs/day: 0.00   Types: Cigarettes   Quit date: 08/16/2017   Years since quitting: 6.2  Smokeless Tobacco Never  Tobacco Comments   quit 07/12/17, abstaining 8/24    Goals Met:  Proper associated with RPD/PD & O2 Sat Independence with exercise equipment Exercise tolerated well No report of concerns or symptoms today Strength training completed today  Goals Unmet:  Not Applicable  Comments: Service time is from 0803 to 0914.    Dr. Slater Kassie is Medical Director for Pulmonary Rehab at [facility].

## 2023-12-02 ENCOUNTER — Encounter (HOSPITAL_COMMUNITY)
Admission: RE | Admit: 2023-12-02 | Discharge: 2023-12-02 | Disposition: A | Discharge status: Home / Self Care | Payer: Medicaid Other | Source: Ambulatory Visit | Attending: Pulmonary Disease | Admitting: Pulmonary Disease

## 2023-12-02 DIAGNOSIS — J449 Chronic obstructive pulmonary disease, unspecified: Secondary | ICD-10-CM

## 2023-12-02 NOTE — Progress Notes (Signed)
Daily Session Note  Patient Details  Name: Caitlin [name] MRN: [id number] Date of Birth: Feb 02, 1966 Referring Provider:   Doristine Devoid Pulmonary Rehab Walk Test from 11/19/2023 in [facility]  Referring Provider Everardo All       Encounter Date: 12/02/2023  Check In:  Session Check In - 12/02/23 0817       Check-In   Supervising physician immediately available to respond to emergencies [facility] immediately available    Physician(s) Bernadene Person, NP    [facility]    Staff Present Elissa Lovett BS, ACSM-CEP, Exercise Physiologist;Brookie Wayment Charlean Sanfilippo, MS, ACSM-CEP, Exercise Physiologist;Mary Gerre Scull, RN, BSN    Virtual Visit No    Medication changes reported     No    Fall or balance concerns reported    No    Tobacco Cessation No Change    Warm-up and Cool-down Performed as group-led instruction    Resistance Training Performed Yes    VAD Patient? No    PAD/SET Patient? No      Pain Assessment   Currently in Pain? No/denies             Capillary Blood Glucose: No results found for this or any previous visit (from the past 24 hours).    Social History   Tobacco Use  Smoking Status Former   Current packs/day: 0.00   Types: Cigarettes   Quit date: 08/16/2017   Years since quitting: 6.2  Smokeless Tobacco Never  Tobacco Comments   quit 07/12/17, abstaining 8/24    Goals Met:  Proper associated with RPD/PD & O2 Sat Independence with exercise equipment Exercise tolerated well No report of concerns or symptoms today Strength training completed today  Goals Unmet:  Not Applicable  Comments: Service time is from 0801 to 0922.    Dr. Mechele Collin is Medical Director for Pulmonary Rehab at [facility].

## 2023-12-07 ENCOUNTER — Encounter (HOSPITAL_COMMUNITY)
Admission: RE | Admit: 2023-12-07 | Discharge: 2023-12-07 | Disposition: A | Discharge status: Home / Self Care | Payer: Medicaid Other | Source: Ambulatory Visit | Attending: Pulmonary Disease | Admitting: Pulmonary Disease

## 2023-12-07 VITALS — Wt 220.2 lb

## 2023-12-07 DIAGNOSIS — J449 Chronic obstructive pulmonary disease, unspecified: Secondary | ICD-10-CM

## 2023-12-07 NOTE — Progress Notes (Signed)
Daily Session Note  Patient Details  Name: Caitlin [name] MRN: [id number] Date of Birth: 06/14/1966 Referring Provider:   Doristine Devoid Pulmonary Rehab Walk Test from 11/19/2023 in [facility]  Referring Provider Everardo All       Encounter Date: 12/07/2023  Check In:  Session Check In - 12/07/23 0820       Check-In   Supervising physician immediately available to respond to emergencies [facility] immediately available    Physician(s) Bernadene Person, NP    [facility]    Staff Present Durel Salts, Zella Richer, MS, ACSM-CEP, Exercise Physiologist    Virtual Visit No    Medication changes reported     No    Fall or balance concerns reported    No    Tobacco Cessation No Change    Warm-up and Cool-down Performed as group-led instruction    Resistance Training Performed Yes    VAD Patient? No    PAD/SET Patient? No      Pain Assessment   Currently in Pain? No/denies    Multiple Pain Sites No             Capillary Blood Glucose: No results found for this or any previous visit (from the past 24 hours).   Exercise Prescription Changes - 12/07/23 0900       Response to Exercise   Blood Pressure (Admit) 118/74    Blood Pressure (Exercise) 130/70    Blood Pressure (Exit) 100/72    Heart Rate (Admit) 79 bpm    Heart Rate (Exercise) 126 bpm    Heart Rate (Exit) 91 bpm    Oxygen Saturation (Admit) 98 %    Oxygen Saturation (Exercise) 95 %    Oxygen Saturation (Exit) 98 %    Rating of Perceived Exertion (Exercise) 11    Perceived Dyspnea (Exercise) 2    Duration Continue with 30 min of aerobic exercise without signs/symptoms of physical distress.    Intensity THRR unchanged      Progression   Progression Continue to progress workloads to maintain intensity without signs/symptoms of physical distress.      Resistance Training   Training Prescription Yes    Weight black bands    Reps 10-15     Time 10 Minutes      Treadmill   MPH 3.1    Grade 2    Minutes 15    METs 4.1      Elliptical   Level 2    Speed 1    Minutes 15    METs 4.3             Social History   Tobacco Use  Smoking Status Former   Current packs/day: 0.00   Types: Cigarettes   Quit date: 08/16/2017   Years since quitting: 6.3  Smokeless Tobacco Never  Tobacco Comments   quit 07/12/17, abstaining 8/24    Goals Met:  Proper associated with RPD/PD & O2 Sat Independence with exercise equipment Exercise tolerated well No report of concerns or symptoms today Strength training completed today  Goals Unmet:  Not Applicable  Comments: Service time is from 0804 to 0922.    Dr. Mechele Collin is Medical Director for Pulmonary Rehab at [facility].

## 2023-12-08 NOTE — Progress Notes (Signed)
Pulmonary Individual Treatment Plan  Patient Details  Name: Caitlin [name] MRN: [id number] Date of Birth: [birth year] Referring Provider:   Doristine Devoid Pulmonary Rehab Walk Test from 11/19/2023 in [facility]  Referring Provider Everardo All       Initial Encounter Date:  Flowsheet Row Pulmonary Rehab Walk Test from 11/19/2023 in [facility], [facility]  Date 11/19/23       Visit Diagnosis: Stage 2 moderate COPD by GOLD classification (HCC)  Patient's Home Medications on Admission:   Current Outpatient Medications:    albuterol (VENTOLIN HFA) 108 (90 Base) MCG/ACT inhaler, Inhale 2 puffs into the lungs every 4 (four) hours as needed for wheezing or shortness of breath., Disp: 8 g, Rfl: 0   atorvastatin (LIPITOR) 10 MG tablet, Take 1 tablet (10 mg total) by mouth daily., Disp: 90 tablet, Rfl: 1   METAMUCIL FIBER PO, Take 3 tablets by mouth daily., Disp: , Rfl:    Omega-3 Fatty Acids (FISH OIL PO), Take 1 capsule by mouth daily., Disp: , Rfl:    umeclidinium-vilanterol (ANORO ELLIPTA) 62.5-25 MCG/ACT AEPB, Inhale 1 puff into the lungs daily at 6 (six) AM., Disp: 60 each, Rfl: 11   Vitamin D, Ergocalciferol, (DRISDOL) 1.25 MG (50000 UNIT) CAPS capsule, Take 1 capsule (50,000 Units total) by mouth every 7 (seven) days., Disp: 12 capsule, Rfl: 0  Past Medical History: Past Medical History:  Diagnosis Date   CHF (congestive heart failure) (HCC)    COPD (chronic obstructive pulmonary disease) (HCC)    HLD (hyperlipidemia)    tx w/atorvastatin   NSTEMI (non-ST elevated myocardial infarction) (HCC)    pt denies   Takotsubo cardiomyopathy     Tobacco Use: Social History   Tobacco Use  Smoking Status Former   Current packs/day: 0.00   Types: Cigarettes   Quit date: 08/16/2017   Years since quitting: 6.3  Smokeless Tobacco Never  Tobacco Comments   quit 07/12/17, abstaining 8/24     Labs: Review Flowsheet  More data exists      Latest Ref Rng & Units 04/16/2020 03/17/2021 01/06/2022 03/24/2022 04/06/2023  Labs for ITP Cardiac and Pulmonary Rehab  Cholestrol 100 - 199 mg/dL 295  284  - 132  440   LDL (calc) 0 - 99 mg/dL 102  725  - 98  97   HDL-C >39 mg/dL 52  45  - 43  38   Trlycerides 0 - 149 mg/dL 95  73  - 366  440   Hemoglobin A1c 4.8 - 5.6 % - - - - 6.1   PH, Arterial 7.35 - 7.45 - - 7.42  - -  PCO2 arterial 32 - 48 mmHg - - 37  - -  Bicarbonate 20.0 - 28.0 mmol/L - - 24.0  - -  Acid-base deficit 0.0 - 2.0 mmol/L - - 0.2  - -  O2 Saturation % - - 98.9  - -    Capillary Blood Glucose: Lab Results  Component Value Date   GLUCAP 79 12/16/2021   GLUCAP 94 09/28/2017   GLUCAP 95 09/28/2017   GLUCAP 108 (H) 09/27/2017   GLUCAP 89 09/27/2017     Pulmonary Assessment Scores:  Pulmonary Assessment Scores     Row Name 11/19/23 1354         ADL UCSD   ADL Phase Entry     SOB Score total 36  CAT Score   CAT Score 12       mMRC Score   mMRC Score 2             UCSD: Self-administered rating of dyspnea associated with activities of daily living (ADLs) 6-point scale (0 = "not at all" to 5 = "maximal or unable to do because of breathlessness")  Scoring Scores range from 0 to 120.  Minimally important difference is 5 units  CAT: CAT can identify the health impairment of COPD patients and is better correlated with disease progression.  CAT has a scoring range of zero to 40. The CAT score is classified into four groups of low (less than 10), medium (10 - 20), high (21-30) and very high (31-40) based on the impact level of disease on health status. A CAT score over 10 suggests significant symptoms.  A worsening CAT score could be explained by an exacerbation, poor medication adherence, poor inhaler technique, or progression of COPD or comorbid conditions.  CAT MCID is 2 points  mMRC: mMRC (Modified Medical Research Council) Dyspnea Scale is used  to assess the degree of baseline functional disability in patients of respiratory disease due to dyspnea. No minimal important difference is established. A decrease in score of 1 point or greater is considered a positive change.   Pulmonary Function Assessment:  Pulmonary Function Assessment - 11/19/23 1354       Breath   Bilateral Breath Sounds Clear;Decreased    Shortness of Breath Yes;Limiting activity             Exercise Target Goals: Exercise Program Goal: Individual exercise prescription set using results from initial 6 min walk test and THRR while considering  patient's activity barriers and safety.   Exercise Prescription Goal: Initial exercise prescription builds to 30-45 minutes a day of aerobic activity, 2-3 days per week.  Home exercise guidelines will be given to patient during program as part of exercise prescription that the participant will acknowledge.  Activity Barriers & Risk Stratification:  Activity Barriers & Cardiac Risk Stratification - 11/19/23 1340       Activity Barriers & Cardiac Risk Stratification   Activity Barriers Deconditioning;Muscular Weakness;Shortness of Breath;Arthritis    Cardiac Risk Stratification Moderate             6 Minute Walk:  6 Minute Walk     Row Name 11/19/23 1421         6 Minute Walk   Phase Initial     Distance 1442 feet     Walk Time 6 minutes     # of Rest Breaks 0     MPH 2.73     METS 3.02     RPE 11     Perceived Dyspnea  1     VO2 Peak 10.56     Symptoms No     Resting HR 92 bpm     Resting BP 132/70     Resting Oxygen Saturation  99 %     Exercise Oxygen Saturation  during 6 min walk 94 %     Max Ex. HR 100 bpm     Max Ex. BP 144/80     2 Minute Post BP 120/70       Interval HR   1 Minute HR 92     2 Minute HR 98     3 Minute HR 98     4 Minute HR 99     5 Minute HR 100  6 Minute HR 96     2 Minute Post HR 84     Interval Heart Rate? Yes       Interval Oxygen   Interval Oxygen?  Yes     Baseline Oxygen Saturation % 99 %     1 Minute Oxygen Saturation % 99 %     1 Minute Liters of Oxygen 0 L     2 Minute Oxygen Saturation % 96 %     2 Minute Liters of Oxygen 0 L     3 Minute Oxygen Saturation % 96 %     3 Minute Liters of Oxygen 0 L     4 Minute Oxygen Saturation % 95 %     4 Minute Liters of Oxygen 0 L     5 Minute Oxygen Saturation % 94 %     5 Minute Liters of Oxygen 0 L     6 Minute Oxygen Saturation % 96 %     6 Minute Liters of Oxygen 0 L     2 Minute Post Oxygen Saturation % 99 %     2 Minute Post Liters of Oxygen 0 L              Oxygen Initial Assessment:  Oxygen Initial Assessment - 11/19/23 1341       Home Oxygen   Home Oxygen Device None    Sleep Oxygen Prescription None    Home Exercise Oxygen Prescription None    Home Resting Oxygen Prescription None      Initial 6 min Walk   Oxygen Used None      Program Oxygen Prescription   Program Oxygen Prescription None      Intervention   Short Term Goals To learn and understand importance of monitoring SPO2 with pulse oximeter and demonstrate accurate use of the pulse oximeter.;To learn and understand importance of maintaining oxygen saturations>88%;To learn and demonstrate proper pursed lip breathing techniques or other breathing techniques. ;To learn and demonstrate proper use of respiratory medications    Long  Term Goals Maintenance of O2 saturations>88%;Compliance with respiratory medication;Verbalizes importance of monitoring SPO2 with pulse oximeter and return demonstration;Exhibits proper breathing techniques, such as pursed lip breathing or other method taught during program session;Demonstrates proper use of MDI's             Oxygen Re-Evaluation:  Oxygen Re-Evaluation     Row Name 11/29/23 0850             Program Oxygen Prescription   Program Oxygen Prescription None         Home Oxygen   Home Oxygen Device None       Sleep Oxygen Prescription None       Home  Exercise Oxygen Prescription None       Home Resting Oxygen Prescription None         Goals/Expected Outcomes   Short Term Goals To learn and understand importance of monitoring SPO2 with pulse oximeter and demonstrate accurate use of the pulse oximeter.;To learn and understand importance of maintaining oxygen saturations>88%;To learn and demonstrate proper pursed lip breathing techniques or other breathing techniques. ;To learn and demonstrate proper use of respiratory medications       Long  Term Goals Maintenance of O2 saturations>88%;Compliance with respiratory medication;Verbalizes importance of monitoring SPO2 with pulse oximeter and return demonstration;Exhibits proper breathing techniques, such as pursed lip breathing or other method taught during program session;Demonstrates proper use of MDI's  Goals/Expected Outcomes Compliance and understanding of oxygen saturation monitoring and breathing techniques to decrease shortness of breath.                Oxygen Discharge (Final Oxygen Re-Evaluation):  Oxygen Re-Evaluation - 11/29/23 0850       Program Oxygen Prescription   Program Oxygen Prescription None      Home Oxygen   Home Oxygen Device None    Sleep Oxygen Prescription None    Home Exercise Oxygen Prescription None    Home Resting Oxygen Prescription None      Goals/Expected Outcomes   Short Term Goals To learn and understand importance of monitoring SPO2 with pulse oximeter and demonstrate accurate use of the pulse oximeter.;To learn and understand importance of maintaining oxygen saturations>88%;To learn and demonstrate proper pursed lip breathing techniques or other breathing techniques. ;To learn and demonstrate proper use of respiratory medications    Long  Term Goals Maintenance of O2 saturations>88%;Compliance with respiratory medication;Verbalizes importance of monitoring SPO2 with pulse oximeter and return demonstration;Exhibits proper breathing techniques, such  as pursed lip breathing or other method taught during program session;Demonstrates proper use of MDI's    Goals/Expected Outcomes Compliance and understanding of oxygen saturation monitoring and breathing techniques to decrease shortness of breath.             Initial Exercise Prescription:  Initial Exercise Prescription - 11/19/23 1400       Date of Initial Exercise RX and Referring Provider   Date 11/19/23    Referring Provider Everardo All    Expected Discharge Date 02/10/24      Treadmill   MPH 2.5    Grade 1    Minutes 15    METs 3      Elliptical   Level 1    Speed 1    Minutes 15    METs 3      Prescription Details   Frequency (times per week) 2    Duration Progress to 30 minutes of continuous aerobic without signs/symptoms of physical distress      Intensity   THRR 40-80% of Max Heartrate 64-129    Ratings of Perceived Exertion 11-13    Perceived Dyspnea 0-4      Progression   Progression Continue to progress workloads to maintain intensity without signs/symptoms of physical distress.      Resistance Training   Training Prescription Yes    Weight black bands    Reps 10-15             Perform Capillary Blood Glucose checks as needed.  Exercise Prescription Changes:   Exercise Prescription Changes     Row Name 11/23/23 0900 12/07/23 0900           Response to Exercise   Blood Pressure (Admit) 130/70 118/74      Blood Pressure (Exercise) 158/80 130/70      Blood Pressure (Exit) 114/70 100/72      Heart Rate (Admit) 84 bpm 79 bpm      Heart Rate (Exercise) 131 bpm 126 bpm      Heart Rate (Exit) 94 bpm 91 bpm      Oxygen Saturation (Admit) 98 % 98 %      Oxygen Saturation (Exercise) 94 % 95 %      Oxygen Saturation (Exit) 99 % 98 %      Rating of Perceived Exertion (Exercise) 13 11      Perceived Dyspnea (Exercise) 2 2  Duration Progress to 30 minutes of  aerobic without signs/symptoms of physical distress Continue with 30 min of aerobic  exercise without signs/symptoms of physical distress.      Intensity THRR unchanged THRR unchanged        Progression   Progression Continue to progress workloads to maintain intensity without signs/symptoms of physical distress. Continue to progress workloads to maintain intensity without signs/symptoms of physical distress.        Resistance Training   Training Prescription Yes Yes      Weight black bands black bands      Reps 10-15 10-15      Time 10 Minutes 10 Minutes        Treadmill   MPH 2.5 3.1      Grade 1 2      Minutes 15 15      METs 3.1 4.1        Elliptical   Level 1 2      Speed 1 1      Minutes 15 15      METs 3.9 4.3               Exercise Comments:   Exercise Comments     Row Name 11/23/23 1884           Exercise Comments Caitlin [name] completed her first day of exercise. She exercised for 15 min on the elliptical and treadmill. Caitlin [name] averaged 3.9 MET on the elliptical and 3.1 METs on the treadmill. She performed the warmup and cooldown standing without limitations.                Exercise Goals and Review:   Exercise Goals     Row Name 11/19/23 1340             Exercise Goals   Increase Physical Activity Yes       Intervention Provide advice, education, support and counseling about physical activity/exercise needs.;Develop an individualized exercise prescription for aerobic and resistive training based on initial evaluation findings, risk stratification, comorbidities and participant's personal goals.       Expected Outcomes Short Term: Attend rehab on a regular basis to increase amount of physical activity.;Long Term: Add in home exercise to make exercise part of routine and to increase amount of physical activity.;Long Term: Exercising regularly at least 3-5 days a week.       Increase Strength and Stamina Yes       Intervention Provide advice, education, support and counseling about physical activity/exercise needs.;Develop an  individualized exercise prescription for aerobic and resistive training based on initial evaluation findings, risk stratification, comorbidities and participant's personal goals.       Expected Outcomes Short Term: Increase workloads from initial exercise prescription for resistance, speed, and METs.;Short Term: Perform resistance training exercises routinely during rehab and add in resistance training at home;Long Term: Improve cardiorespiratory fitness, muscular endurance and strength as measured by increased METs and functional capacity ( )       Able to understand and use rate of perceived exertion (RPE) scale Yes       Intervention Provide education and explanation on how to use RPE scale       Expected Outcomes Short Term: Able to use RPE daily in rehab to express subjective intensity level;Long Term:  Able to use RPE to guide intensity level when exercising independently       Able to understand and use Dyspnea scale Yes       Intervention Provide  education and explanation on how to use Dyspnea scale       Expected Outcomes Short Term: Able to use Dyspnea scale daily in rehab to express subjective sense of shortness of breath during exertion;Long Term: Able to use Dyspnea scale to guide intensity level when exercising independently       Knowledge and understanding of Target Heart Rate Range (THRR) Yes       Intervention Provide education and explanation of THRR including how the numbers were predicted and where they are located for reference       Expected Outcomes Short Term: Able to state/look up THRR;Long Term: Able to use THRR to govern intensity when exercising independently;Short Term: Able to use daily as guideline for intensity in rehab       Understanding of Exercise Prescription Yes       Intervention Provide education, explanation, and written materials on patient's individual exercise prescription       Expected Outcomes Short Term: Able to explain program exercise  prescription;Long Term: Able to explain home exercise prescription to exercise independently                Exercise Goals Re-Evaluation :  Exercise Goals Re-Evaluation     Row Name 11/29/23 0849             Exercise Goal Re-Evaluation   Exercise Goals Review Increase Physical Activity;Able to understand and use Dyspnea scale;Understanding of Exercise Prescription;Increase Strength and Stamina;Knowledge and understanding of Target Heart Rate Range (THRR);Able to understand and use rate of perceived exertion (RPE) scale       Comments Caitlin [name] has completed 2 exercise sessions. She exercises for 15 min on the upright elliptical and treadmill. She peforms the warmup and cooldown standing without limitations. It is too soon to notate any discernable progressions. Will continue to monitor and progress as able.       Expected Outcomes Through exercise at rehab and home, the patient will decrease shortness of breath with daily activities and feel confident in carrying out an exercise regimen at home.                Discharge Exercise Prescription (Final Exercise Prescription Changes):  Exercise Prescription Changes - 12/07/23 0900       Response to Exercise   Blood Pressure (Admit) 118/74    Blood Pressure (Exercise) 130/70    Blood Pressure (Exit) 100/72    Heart Rate (Admit) 79 bpm    Heart Rate (Exercise) 126 bpm    Heart Rate (Exit) 91 bpm    Oxygen Saturation (Admit) 98 %    Oxygen Saturation (Exercise) 95 %    Oxygen Saturation (Exit) 98 %    Rating of Perceived Exertion (Exercise) 11    Perceived Dyspnea (Exercise) 2    Duration Continue with 30 min of aerobic exercise without signs/symptoms of physical distress.    Intensity THRR unchanged      Progression   Progression Continue to progress workloads to maintain intensity without signs/symptoms of physical distress.      Resistance Training   Training Prescription Yes    Weight black bands    Reps 10-15    Time  10 Minutes      Treadmill   MPH 3.1    Grade 2    Minutes 15    METs 4.1      Elliptical   Level 2    Speed 1    Minutes 15    METs 4.3  Nutrition:  Target Goals: Understanding of nutrition guidelines, daily intake of sodium 1500mg , cholesterol 200mg , calories 30% from fat and 7% or less from saturated fats, daily to have 5 or more servings of fruits and vegetables.  Biometrics:  Pre Biometrics - 11/19/23 1415       Pre Biometrics   Grip Strength 40 kg              Nutrition Therapy Plan and Nutrition Goals:   Nutrition Assessments:  MEDIFICTS Score Key: >=70 Need to make dietary changes  40-70 Heart Healthy Diet <= 40 Therapeutic Level Cholesterol Diet   Picture Your Plate Scores: <09 Unhealthy dietary pattern with much room for improvement. 41-50 Dietary pattern unlikely to meet recommendations for good health and room for improvement. 51-60 More healthful dietary pattern, with some room for improvement.  >60 Healthy dietary pattern, although there may be some specific behaviors that could be improved.    Nutrition Goals Re-Evaluation:   Nutrition Goals Discharge (Final Nutrition Goals Re-Evaluation):   Psychosocial: Target Goals: Acknowledge presence or absence of significant depression and/or stress, maximize coping skills, provide positive support system. Participant is able to verbalize types and ability to use techniques and skills needed for reducing stress and depression.  Initial Review & Psychosocial Screening:  Initial Psych Review & Screening - 11/19/23 1338       Initial Review   Current issues with None Identified      Family Dynamics   Good Support System? Yes      Barriers   Psychosocial barriers to participate in program There are no identifiable barriers or psychosocial needs.      Screening Interventions   Interventions Encouraged to exercise    Expected Outcomes Short Term goal: Utilizing psychosocial  counselor, staff and physician to assist with identification of specific Stressors or current issues interfering with healing process. Setting desired goal for each stressor or current issue identified.;Long Term Goal: Stressors or current issues are controlled or eliminated.;Short Term goal: Identification and review with participant of any Quality of Life or Depression concerns found by scoring the questionnaire.;Long Term goal: The participant improves quality of Life and PHQ9 Scores as seen by post scores and/or verbalization of changes             Quality of Life Scores:  Scores of 19 and below usually indicate a poorer quality of life in these areas.  A difference of  2-3 points is a clinically meaningful difference.  A difference of 2-3 points in the total score of the Quality of Life Index has been associated with significant improvement in overall quality of life, self-image, physical symptoms, and general health in studies assessing change in quality of life.  PHQ-9: Review Flowsheet  More data exists      11/19/2023 06/18/2023 07/23/2022 03/24/2022 09/24/2021  Depression screen PHQ 2/9  Decreased Interest 0 1 0 0 0  Down, Depressed, Hopeless 0 0 0 0 0  PHQ - 2 Score 0 1 0 0 0  Altered sleeping 0 1 0 0 -  Tired, decreased energy 2 1 2  0 -  Change in appetite 0 0 0 0 -  Feeling bad or failure about yourself  0 0 0 0 -  Trouble concentrating 0 0 0 0 -  Moving slowly or fidgety/restless 0 0 0 0 -  Suicidal thoughts 0 0 0 0 -  PHQ-9 Score 2 3 2  0 -  Difficult doing work/chores Not difficult at all Somewhat difficult Not difficult  at all Not difficult at all -   Interpretation of Total Score  Total Score Depression Severity:  1-4 = Minimal depression, 5-9 = Mild depression, 10-14 = Moderate depression, 15-19 = Moderately severe depression, 20-27 = Severe depression   Psychosocial Evaluation and Intervention:  Psychosocial Evaluation - 11/19/23 1338       Psychosocial Evaluation &  Interventions   Interventions Encouraged to exercise with the program and follow exercise prescription    Comments Caitlin [name] denies any psychsocial barriers or concerns at this time.    Expected Outcomes For pt to participate in PR free of any psychosocial barriers or concerns.    Continue Psychosocial Services  No Follow up required             Psychosocial Re-Evaluation:  Psychosocial Re-Evaluation     Row Name 12/08/23 1048             Psychosocial Re-Evaluation   Current issues with None Identified       Comments At re-evaluation, Caitlin [name] still denies any psychsocial barriers or concerns at this time.       Expected Outcomes For pt to participate in PR free of any psychosocial barriers or concerns.       Interventions Encouraged to attend Pulmonary Rehabilitation for the exercise       Continue Psychosocial Services  No Follow up required                Psychosocial Discharge (Final Psychosocial Re-Evaluation):  Psychosocial Re-Evaluation - 12/08/23 1048       Psychosocial Re-Evaluation   Current issues with None Identified    Comments At re-evaluation, Caitlin [name] still denies any psychsocial barriers or concerns at this time.    Expected Outcomes For pt to participate in PR free of any psychosocial barriers or concerns.    Interventions Encouraged to attend Pulmonary Rehabilitation for the exercise    Continue Psychosocial Services  No Follow up required             Education: Education Goals: Education classes will be provided on a weekly basis, covering required topics. Participant will state understanding/return demonstration of topics presented.  Learning Barriers/Preferences:  Learning Barriers/Preferences - 11/19/23 1339       Learning Barriers/Preferences   Learning Barriers None    Learning Preferences None             Education Topics: Know Your Numbers Group instruction that is supported by a PowerPoint presentation. Instructor discusses  importance of knowing and understanding resting, exercise, and post-exercise oxygen saturation, heart rate, and blood pressure. Oxygen saturation, heart rate, blood pressure, rating of perceived exertion, and dyspnea are reviewed along with a normal range for these values.    Exercise for the Pulmonary Patient Group instruction that is supported by a PowerPoint presentation. Instructor discusses benefits of exercise, core components of exercise, frequency, duration, and intensity of an exercise routine, importance of utilizing pulse oximetry during exercise, safety while exercising, and options of places to exercise outside of rehab.    MET Level  Group instruction provided by PowerPoint, verbal discussion, and written material to support subject matter. Instructor reviews what METs are and how to increase METs.    Pulmonary Medications Verbally interactive group education provided by instructor with focus on inhaled medications and proper administration. Flowsheet Row PULMONARY REHAB CHRONIC OBSTRUCTIVE PULMONARY DISEASE from 08/05/2023 in [facility]  Date 08/05/23  Educator RT  Instruction Review Code 1-  Verbalizes Personnel officer and Physiology of the Respiratory System Group instruction provided by PowerPoint, verbal discussion, and written material to support subject matter. Instructor reviews respiratory cycle and anatomical components of the respiratory system and their functions. Instructor also reviews differences in obstructive and restrictive respiratory diseases with examples of each.  Flowsheet Row PULMONARY REHAB CHRONIC OBSTRUCTIVE PULMONARY DISEASE from 07/29/2023 in [facility]  Date 07/29/23  Educator RT  Instruction Review Code 1- Verbalizes Understanding       Oxygen Safety Group instruction provided by PowerPoint, verbal discussion, and  written material to support subject matter. There is an overview of "What is Oxygen" and "Why do we need it".  Instructor also reviews how to create a safe environment for oxygen use, the importance of using oxygen as prescribed, and the risks of noncompliance. There is a brief discussion on traveling with oxygen and resources the patient may utilize. Flowsheet Row PULMONARY REHAB CHRONIC OBSTRUCTIVE PULMONARY DISEASE from 11/25/2023 in [facility], V[facility], & Lung Health  Date 11/25/23  Educator RN  Instruction Review Code 1- Verbalizes Understanding       Oxygen Use Group instruction provided by PowerPoint, verbal discussion, and written material to discuss how supplemental oxygen is prescribed and different types of oxygen supply systems. Resources for more information are provided.  Flowsheet Row PULMONARY REHAB CHRONIC OBSTRUCTIVE PULMONARY DISEASE from 12/02/2023 in [facility], Vascular, & Lung [facility]  Date 12/02/23  Educator RT  Instruction Review Code 1- Verbalizes Understanding       Breathing Techniques Group instruction that is supported by demonstration and informational handouts. Instructor discusses the benefits of pursed lip and diaphragmatic breathing and detailed demonstration on how to perform both.     Risk Factor Reduction Group instruction that is supported by a PowerPoint presentation. Instructor discusses the definition of a risk factor, different risk factors for pulmonary disease, and how the heart and lungs work together.   Pulmonary Diseases Group instruction provided by PowerPoint, verbal discussion, and written material to support subject matter. Instructor gives an overview of the different type of pulmonary diseases. There is also a discussion on risk factors and symptoms as well as ways to manage the diseases.   Stress and Energy Conservation Group instruction provided by PowerPoint,  verbal discussion, and written material to support subject matter. Instructor gives an overview of stress and the impact it can have on the body. Instructor also reviews ways to reduce stress. There is also a discussion on energy conservation and ways to conserve energy throughout the day.   Warning Signs and Symptoms Group instruction provided by PowerPoint, verbal discussion, and written material to support subject matter. Instructor reviews warning signs and symptoms of stroke, heart attack, cold and flu. Instructor also reviews ways to prevent the spread of infection.   Other Education Group or individual verbal, written, or video instructions that support the educational goals of the pulmonary rehab program. Flowsheet Row PULMONARY REHAB CHRONIC OBSTRUCTIVE PULMONARY DISEASE from 07/22/2023 in [facility] for Heart, Vascular, & Lung Health  Date 07/22/23  Educator RT  Instruction Review Code 1- Verbalizes Understanding        Knowledge Questionnaire Score:  Knowledge Questionnaire Score - 11/19/23 1331       Knowledge Questionnaire Score   Pre Score 15/18  Core Components/Risk Factors/Patient Goals at Admission:  Personal Goals and Risk Factors at Admission - 11/19/23 1339       Core Components/Risk Factors/Patient Goals on Admission   Improve shortness of breath with ADL's Yes    Intervention Provide education, individualized exercise plan and daily activity instruction to help decrease symptoms of SOB with activities of daily living.    Expected Outcomes Short Term: Improve cardiorespiratory fitness to achieve a reduction of symptoms when performing ADLs;Long Term: Be able to perform more ADLs without symptoms or delay the onset of symptoms    Increase knowledge of respiratory medications and ability to use respiratory devices properly  Yes    Intervention Provide education and demonstration as needed of appropriate use of medications,  inhalers, and oxygen therapy.    Expected Outcomes Short Term: Achieves understanding of medications use. Understands that oxygen is a medication prescribed by physician. Demonstrates appropriate use of inhaler and oxygen therapy.;Long Term: Maintain appropriate use of medications, inhalers, and oxygen therapy.             Core Components/Risk Factors/Patient Goals Review:   Goals and Risk Factor Review     Row Name 12/08/23 1050             Core Components/Risk Factors/Patient Goals Review   Personal Goals Review Improve shortness of breath with ADL's;Develop more efficient breathing techniques such as purse lipped breathing and diaphragmatic breathing and practicing self-pacing with activity.;Increase knowledge of respiratory medications and ability to use respiratory devices properly.       Review Monthly review of patient's Core Components/Risk Factors/Patient Goals are as follows: Goal in progress for improving her shortness of breath with ADLs and developing more efficient breathing techniques such as purse lipped breathing and diaphragmatic breathing; and practicing self-pacing with activity. Caitlin [name] has been able to increase her workload and METS while maintaining her oxygen saturation on room air. This is her 3rd time attending the program and she is familiar with the routine, how to stop and purse lip breathe and how to pace herself. She is building up her endurance and stamina to complete activities of daily living at home with decreased shortness of breath. Goal met for increasing her knowledge of respiratory medications and the ability to use respiratory devices properly. Our respiratory therapist has reviewed medications with Caitlin [name], Caitlin [name] has demonstrated how to use the inhalers, when to use them and has verbalized side effects correctly. Caitlin [name] stated she is confidant in using her medications and now understands what they are used for. Caitlin [name] will continue to benefit from  participation in [place] for nutrition, education, exercise, and lifestyle modification.       Expected Outcomes For Caitlin [name] to improve her shortness of breath with ADLs, develop more efficient breathing techniques such as purse lipped breathing and diaphragmatic breathing; and practicing self-pacing with activity and improve shortness of breath with ADL's                Core Components/Risk Factors/Patient Goals at Discharge (Final Review):   Goals and Risk Factor Review - 12/08/23 1050       Core Components/Risk Factors/Patient Goals Review   Personal Goals Review Improve shortness of breath with ADL's;Develop more efficient breathing techniques such as purse lipped breathing and diaphragmatic breathing and practicing self-pacing with activity.;Increase knowledge of respiratory medications and ability to use respiratory devices properly.    Review Monthly review of patient's Core Components/Risk Factors/Patient Goals are as follows: Goal in progress for improving her shortness of  breath with ADLs and developing more efficient breathing techniques such as purse lipped breathing and diaphragmatic breathing; and practicing self-pacing with activity. Caitlin [name] has been able to increase her workload and METS while maintaining her oxygen saturation on room air. This is her 3rd time attending the program and she is familiar with the routine, how to stop and purse lip breathe and how to pace herself. She is building up her endurance and stamina to complete activities of daily living at home with decreased shortness of breath. Goal met for increasing her knowledge of respiratory medications and the ability to use respiratory devices properly. Our respiratory therapist has reviewed medications with Caitlin [name], Caitlin [name] has demonstrated how to use the inhalers, when to use them and has verbalized side effects correctly. Caitlin [name] stated she is confidant in using her medications and now understands what they are used for. Caitlin [name]  will continue to benefit from participation in [place] for nutrition, education, exercise, and lifestyle modification.    Expected Outcomes For Caitlin [name] to improve her shortness of breath with ADLs, develop more efficient breathing techniques such as purse lipped breathing and diaphragmatic breathing; and practicing self-pacing with activity and improve shortness of breath with ADL's             ITP Comments: Pt is making expected progress toward Pulmonary Rehab goals after completing 5 session(s). Recommend continued exercise, life style modification, education, and utilization of breathing techniques to increase stamina and strength, while also decreasing shortness of breath with exertion.     Comments: Dr. Mechele Collin is Medical Director for Pulmonary Rehab at [facility].

## 2023-12-09 ENCOUNTER — Encounter (HOSPITAL_COMMUNITY)
Admission: RE | Admit: 2023-12-09 | Discharge: 2023-12-09 | Disposition: A | Discharge status: Home / Self Care | Payer: Medicaid Other | Source: Ambulatory Visit | Attending: Pulmonary Disease | Admitting: Pulmonary Disease

## 2023-12-09 DIAGNOSIS — J449 Chronic obstructive pulmonary disease, unspecified: Secondary | ICD-10-CM | POA: Diagnosis not present

## 2023-12-09 NOTE — Progress Notes (Signed)
Daily Session Note  Patient Details  Name: Caitlin [name] MRN: [id number] Date of Birth: July 07, 1966 Referring Provider:   Doristine Devoid Pulmonary Rehab Walk Test from 11/19/2023 in [facility], Vascular, & Lung Health  Referring Provider Everardo All       Encounter Date: 12/09/2023  Check In:  Session Check In - 12/09/23 0820       Check-In   Supervising physician immediately available to respond to emergencies [facility] MD immediately available    Physician(s) Robin Searing, NP    Location [facility]    Staff Present Durel Salts, Zella Richer, MS, ACSM-CEP, Exercise Physiologist;Mary Bastin, RN, BSN    Virtual Visit No    Medication changes reported     No    Fall or balance concerns reported    No    Tobacco Cessation No Change    Warm-up and Cool-down Performed as group-led instruction    Resistance Training Performed Yes    VAD Patient? No    PAD/SET Patient? No      Pain Assessment   Currently in Pain? No/denies    Multiple Pain Sites No             Capillary Blood Glucose: No results found for this or any previous visit (from the past 24 hours).    Social History   Tobacco Use  Smoking Status Former   Current packs/day: 0.00   Types: Cigarettes   Quit date: 08/16/2017   Years since quitting: 6.3  Smokeless Tobacco Never  Tobacco Comments   quit 07/12/17, abstaining 8/24    Goals Met:  Proper associated with RPD/PD & O2 Sat Independence with exercise equipment Exercise tolerated well No report of concerns or symptoms today Strength training completed today  Goals Unmet:  Not Applicable  Comments: Service time is from 0805 to 0925.    Dr. Mechele Collin is Medical Director for Pulmonary Rehab at [facility].

## 2023-12-09 NOTE — Progress Notes (Signed)
Home Exercise Prescription I have reviewed a Home Exercise Prescription with [facility]. Caitlin [name] is currently exercising at home. I encouraged her to continue her home exercise. She walks 5+ days/wk for 30 min/day and does resistance training. I am confident in Caitlin [name] completing a home exercise regimen. I told her to let me know if she has any questions or needs any assistance. She voiced understanding. The patient stated that their goals were to maintain health. We reviewed exercise guidelines, target heart rate during exercise, RPE Scale, weather conditions, endpoints for exercise, warmup and cool down. The patient is encouraged to come to me with any questions. I will continue to follow up with the patient to assist them with progression and safety. Spent 15 min with patient discussing home exercise plan and goals  Joya San, MS, ACSM-CEP 12/09/2023 3:12 PM

## 2023-12-14 ENCOUNTER — Encounter (HOSPITAL_COMMUNITY): Payer: Medicaid Other

## 2023-12-16 ENCOUNTER — Encounter (HOSPITAL_COMMUNITY)
Admission: RE | Admit: 2023-12-16 | Discharge: 2023-12-16 | Disposition: A | Discharge status: Home / Self Care | Payer: Medicaid Other | Source: Ambulatory Visit | Attending: Pulmonary Disease | Admitting: Pulmonary Disease

## 2023-12-16 DIAGNOSIS — J449 Chronic obstructive pulmonary disease, unspecified: Secondary | ICD-10-CM

## 2023-12-16 NOTE — Progress Notes (Signed)
Daily Session Note  Patient Details  Name: Caitlin [name] MRN: [id number] Date of Birth: May 28, 1966 Referring Provider:   Doristine Devoid Pulmonary Rehab Walk Test from 11/19/2023 in [facility] for Heart, Vascular, & Lung Health  Referring Provider Everardo All       Encounter Date: 12/16/2023  Check In:  Session Check In - 12/16/23 0827       Check-In   Supervising physician immediately available to respond to emergencies [facility] MD immediately available    Physician(s) Edd Fabian, NP    [facility]    Staff Present Durel Salts, Zella Richer, MS, ACSM-CEP, Exercise Physiologist;Trysta Showman Gerre Scull, RN, BSN;Randi Reeve BS, ACSM-CEP, Exercise Physiologist    Virtual Visit No    Medication changes reported     No    Fall or balance concerns reported    No    Tobacco Cessation No Change    Warm-up and Cool-down Performed as group-led instruction    Resistance Training Performed Yes    VAD Patient? No    PAD/SET Patient? No      Pain Assessment   Currently in Pain? No/denies    Multiple Pain Sites No             Capillary Blood Glucose: No results found for this or any previous visit (from the past 24 hours).    Social History   Tobacco Use  Smoking Status Former   Current packs/day: 0.00   Types: Cigarettes   Quit date: 08/16/2017   Years since quitting: 6.3  Smokeless Tobacco Never  Tobacco Comments   quit 07/12/17, abstaining 8/24    Goals Met:  Independence with exercise equipment Exercise tolerated well No report of concerns or symptoms today Strength training completed today  Goals Unmet:  Not Applicable  Comments: Service time is from 0806 to 0933    Dr. Mechele Collin is Medical Director for Pulmonary Rehab at [facility].

## 2023-12-21 ENCOUNTER — Encounter (HOSPITAL_COMMUNITY)
Admission: RE | Admit: 2023-12-21 | Discharge: 2023-12-21 | Disposition: A | Discharge status: Home / Self Care | Payer: Medicaid Other | Source: Ambulatory Visit | Attending: Pulmonary Disease | Admitting: Pulmonary Disease

## 2023-12-21 VITALS — Wt 219.8 lb

## 2023-12-21 DIAGNOSIS — J449 Chronic obstructive pulmonary disease, unspecified: Secondary | ICD-10-CM

## 2023-12-21 NOTE — Progress Notes (Addendum)
Daily Session Note  Patient Details  Name: Caitlin [name] MRN: [id number] Date of Birth: November 20, 1965 Referring Provider:   Doristine Devoid Pulmonary Rehab Walk Test from 11/19/2023 in [facility] for Heart, Vascular, & Lung Health  Referring Provider Everardo All       Encounter Date: 12/21/2023  Check In:  Session Check In - 12/21/23 0818       Check-In   Supervising physician immediately available to respond to emergencies [facility] MD immediately available    Physician(s) Edd Fabian, NP    Location [facility]    Staff Present Durel Salts, Zella Richer, MS, ACSM-CEP, Exercise Physiologist;Randi Dionisio Paschal, ACSM-CEP, Exercise Physiologist    Virtual Visit No    Medication changes reported     No    Fall or balance concerns reported    No    Tobacco Cessation No Change    Warm-up and Cool-down Performed as group-led instruction    Resistance Training Performed Yes    VAD Patient? No    PAD/SET Patient? No      Pain Assessment   Currently in Pain? No/denies    Multiple Pain Sites No             Capillary Blood Glucose: No results found for this or any previous visit (from the past 24 hours).   Exercise Prescription Changes - 12/21/23 0900       Response to Exercise   Blood Pressure (Admit) 122/80    Blood Pressure (Exercise) 174/80    Blood Pressure (Exit) 120/70    Heart Rate (Admit) 83 bpm    Heart Rate (Exercise) 125 bpm    Heart Rate (Exit) 89 bpm    Oxygen Saturation (Admit) 99 %    Oxygen Saturation (Exercise) 95 %    Oxygen Saturation (Exit) 98 %    Rating of Perceived Exertion (Exercise) 13    Perceived Dyspnea (Exercise) 2    Duration Continue with 30 min of aerobic exercise without signs/symptoms of physical distress.    Intensity THRR unchanged      Progression   Progression Continue to progress workloads to maintain intensity without signs/symptoms of physical distress.      Resistance Training   Training  Prescription Yes    Weight black bands    Reps 10-15    Time 10 Minutes      Treadmill   MPH 3.1    Grade 2    Minutes 15    METs 4.2      Elliptical   Level 2    Speed 1    Minutes 15    METs 4.1             Social History   Tobacco Use  Smoking Status Former   Current packs/day: 0.00   Types: Cigarettes   Quit date: 08/16/2017   Years since quitting: 6.3  Smokeless Tobacco Never  Tobacco Comments   quit 07/12/17, abstaining 8/24    Goals Met:  Independence with exercise equipment Exercise tolerated well No report of concerns or symptoms today Strength training completed today  Goals Unmet:  Not Applicable  Comments: Service time is from 0805 to 0923    Dr. Mechele Collin is Medical Director for Pulmonary Rehab at [facility].

## 2023-12-23 ENCOUNTER — Encounter (HOSPITAL_COMMUNITY)
Admission: RE | Admit: 2023-12-23 | Discharge: 2023-12-23 | Disposition: A | Discharge status: Home / Self Care | Payer: Medicaid Other | Source: Ambulatory Visit | Attending: Pulmonary Disease | Admitting: Pulmonary Disease

## 2023-12-23 DIAGNOSIS — J449 Chronic obstructive pulmonary disease, unspecified: Secondary | ICD-10-CM

## 2023-12-23 NOTE — Progress Notes (Signed)
 Daily Session Note  Patient Details  Name: Caitlin [name] MRN: [id number] Date of Birth: 1966-08-05 Referring Provider:   Conrad Ports Pulmonary Rehab Walk Test from 11/19/2023 in [facility] for Heart, Vascular, & Lung Health  Referring Provider Kassie       Encounter Date: 12/23/2023  Check In:  Session Check In - 12/23/23 9185       Check-In   Supervising physician immediately available to respond to emergencies [facility] MD immediately available    Physician(s) Rosabel Mose, NP    Location [facility]    Staff Present Augustin Sharps, Neita Moats, MS, ACSM-CEP, Exercise Physiologist;Randi Midge HECKLE, ACSM-CEP, Exercise Physiologist;Mary Harvy, RN, BSN    Virtual Visit No    Medication changes reported     No    Fall or balance concerns reported    No    Tobacco Cessation No Change    Warm-up and Cool-down Performed as group-led instruction    Resistance Training Performed Yes    VAD Patient? No    PAD/SET Patient? No      Pain Assessment   Currently in Pain? No/denies    Multiple Pain Sites No             Capillary Blood Glucose: No results found for this or any previous visit (from the past 24 hours).    Social History   Tobacco Use  Smoking Status Former   Current packs/day: 0.00   Types: Cigarettes   Quit date: 08/16/2017   Years since quitting: 6.3  Smokeless Tobacco Never  Tobacco Comments   quit 07/12/17, abstaining 8/24    Goals Met:  Proper associated with RPD/PD & O2 Sat Independence with exercise equipment Exercise tolerated well No report of concerns or symptoms today Strength training completed today  Goals Unmet:  Not Applicable  Comments: Service time is from 0806 to 0905.    Dr. Slater Kassie is Medical Director for Pulmonary Rehab at [facility].

## 2023-12-28 ENCOUNTER — Encounter (HOSPITAL_COMMUNITY)
Admission: RE | Admit: 2023-12-28 | Discharge: 2023-12-28 | Disposition: A | Discharge status: Home / Self Care | Payer: Medicaid Other | Source: Ambulatory Visit | Attending: Pulmonary Disease | Admitting: Pulmonary Disease

## 2023-12-28 VITALS — Wt 219.8 lb

## 2023-12-28 DIAGNOSIS — J449 Chronic obstructive pulmonary disease, unspecified: Secondary | ICD-10-CM | POA: Diagnosis not present

## 2023-12-28 NOTE — Progress Notes (Signed)
Daily Session Note  Patient Details  Name: Caitlin [name] MRN: [id number] Date of Birth: June 08, 1966 Referring Provider:   Doristine Devoid Pulmonary Rehab Walk Test from 11/19/2023 in [facility] for Heart, Vascular, & Lung Health  Referring Provider Everardo All       Encounter Date: 12/28/2023  Check In:  Session Check In - 12/28/23 4098       Check-In   Supervising physician immediately available to respond to emergencies [facility] MD immediately available    Physician(s) Hoover Browns, NP    Location [facility]    Staff Present Durel Salts, Zella Richer, MS, ACSM-CEP, Exercise Physiologist;Randi Dionisio Paschal, ACSM-CEP, Exercise Physiologist    Virtual Visit No    Medication changes reported     No    Fall or balance concerns reported    No    Tobacco Cessation No Change    Warm-up and Cool-down Performed as group-led instruction    Resistance Training Performed Yes    VAD Patient? No    PAD/SET Patient? No      Pain Assessment   Currently in Pain? No/denies    Multiple Pain Sites No             Capillary Blood Glucose: No results found for this or any previous visit (from the past 24 hours).    Social History   Tobacco Use  Smoking Status Former   Current packs/day: 0.00   Types: Cigarettes   Quit date: 08/16/2017   Years since quitting: 6.3  Smokeless Tobacco Never  Tobacco Comments   quit 07/12/17, abstaining 8/24    Goals Met:  Proper associated with RPD/PD & O2 Sat Independence with exercise equipment Exercise tolerated well No report of concerns or symptoms today Strength training completed today  Goals Unmet:  Not Applicable  Comments: Service time is from 0802 to 0909.    Dr. Mechele Collin is Medical Director for Pulmonary Rehab at [facility].

## 2023-12-30 ENCOUNTER — Encounter (HOSPITAL_COMMUNITY): Payer: Medicaid Other

## 2024-01-04 ENCOUNTER — Telehealth (HOSPITAL_COMMUNITY): Payer: Self-pay

## 2024-01-04 ENCOUNTER — Encounter (HOSPITAL_COMMUNITY)
Admission: RE | Admit: 2024-01-04 | Discharge: 2024-01-04 | Disposition: A | Discharge status: Home / Self Care | Payer: Medicaid Other | Source: Ambulatory Visit | Attending: Pulmonary Disease | Admitting: Pulmonary Disease

## 2024-01-04 DIAGNOSIS — J449 Chronic obstructive pulmonary disease, unspecified: Secondary | ICD-10-CM

## 2024-01-04 NOTE — Telephone Encounter (Signed)
Called to check on Caitlin [name] since she has been absent. Left a message with a call back number.

## 2024-01-04 NOTE — Telephone Encounter (Signed)
Spoke to patient about cancelling Pulm Rehab Class on Thursday d/t weather.

## 2024-01-05 NOTE — Progress Notes (Addendum)
Pulmonary Individual Treatment Plan  Patient Details  Name: Caitlin [name] MRN: [id number] Date of Birth: 07/15/1966 Referring Provider:   Doristine Devoid Pulmonary Rehab Walk Test from 11/19/2023 in [facility]  Referring Provider Everardo All       Initial Encounter Date:  Flowsheet Row Pulmonary Rehab Walk Test from 11/19/2023 in [facility]  Date 11/19/23       Visit Diagnosis: Stage 2 moderate COPD by GOLD classification (HCC)  Patient's Home Medications on Admission:   Current Outpatient Medications:    albuterol (VENTOLIN HFA) 108 (90 Base) MCG/ACT inhaler, Inhale 2 puffs into the lungs every 4 (four) hours as needed for wheezing or shortness of breath., Disp: 8 g, Rfl: 0   atorvastatin (LIPITOR) 10 MG tablet, Take 1 tablet (10 mg total) by mouth daily., Disp: 90 tablet, Rfl: 1   METAMUCIL FIBER PO, Take 3 tablets by mouth daily., Disp: , Rfl:    Omega-3 Fatty Acids (FISH OIL PO), Take 1 capsule by mouth daily., Disp: , Rfl:    umeclidinium-vilanterol (ANORO ELLIPTA) 62.5-25 MCG/ACT AEPB, Inhale 1 puff into the lungs daily at 6 (six) AM., Disp: 60 each, Rfl: 11   Vitamin D, Ergocalciferol, (DRISDOL) 1.25 MG (50000 UNIT) CAPS capsule, Take 1 capsule (50,000 Units total) by mouth every 7 (seven) days., Disp: 12 capsule, Rfl: 0  Past Medical History: Past Medical History:  Diagnosis Date   CHF (congestive heart failure) (HCC)    COPD (chronic obstructive pulmonary disease) (HCC)    HLD (hyperlipidemia)    tx w/atorvastatin   NSTEMI (non-ST elevated myocardial infarction) (HCC)    pt denies   Takotsubo cardiomyopathy     Tobacco Use: Social History   Tobacco Use  Smoking Status Former   Current packs/day: 0.00   Types: Cigarettes   Quit date: 08/16/2017   Years since quitting: 6.3  Smokeless Tobacco Never  Tobacco Comments   quit 07/12/17, abstaining 8/24     Labs: Review Flowsheet  More data exists      Latest Ref Rng & Units 04/16/2020 03/17/2021 01/06/2022 03/24/2022 04/06/2023  Labs for ITP Cardiac and Pulmonary Rehab  Cholestrol 100 - 199 mg/dL 425  956  - 387  564   LDL (calc) 0 - 99 mg/dL 332  951  - 98  97   HDL-C >39 mg/dL 52  45  - 43  38   Trlycerides 0 - 149 mg/dL 95  73  - 884  166   Hemoglobin A1c 4.8 - 5.6 % - - - - 6.1   PH, Arterial 7.35 - 7.45 - - 7.42  - -  PCO2 arterial 32 - 48 mmHg - - 37  - -  Bicarbonate 20.0 - 28.0 mmol/L - - 24.0  - -  Acid-base deficit 0.0 - 2.0 mmol/L - - 0.2  - -  O2 Saturation % - - 98.9  - -    Capillary Blood Glucose: Lab Results  Component Value Date   GLUCAP 79 12/16/2021   GLUCAP 94 09/28/2017   GLUCAP 95 09/28/2017   GLUCAP 108 (H) 09/27/2017   GLUCAP 89 09/27/2017     Pulmonary Assessment Scores:  Pulmonary Assessment Scores     Row Name 11/19/23 1354         ADL UCSD   ADL Phase Entry     SOB Score total 36  CAT Score   CAT Score 12       mMRC Score   mMRC Score 2             UCSD: Self-administered rating of dyspnea associated with activities of daily living (ADLs) 6-point scale (0 = "not at all" to 5 = "maximal or unable to do because of breathlessness")  Scoring Scores range from 0 to 120.  Minimally important difference is 5 units  CAT: CAT can identify the health impairment of COPD patients and is better correlated with disease progression.  CAT has a scoring range of zero to 40. The CAT score is classified into four groups of low (less than 10), medium (10 - 20), high (21-30) and very high (31-40) based on the impact level of disease on health status. A CAT score over 10 suggests significant symptoms.  A worsening CAT score could be explained by an exacerbation, poor medication adherence, poor inhaler technique, or progression of COPD or comorbid conditions.  CAT MCID is 2 points  mMRC: mMRC (Modified Medical Research Council) Dyspnea Scale is used  to assess the degree of baseline functional disability in patients of respiratory disease due to dyspnea. No minimal important difference is established. A decrease in score of 1 point or greater is considered a positive change.   Pulmonary Function Assessment:  Pulmonary Function Assessment - 11/19/23 1354       Breath   Bilateral Breath Sounds Clear;Decreased    Shortness of Breath Yes;Limiting activity             Exercise Target Goals: Exercise Program Goal: Individual exercise prescription set using results from initial 6 min walk test and THRR while considering  patient's activity barriers and safety.   Exercise Prescription Goal: Initial exercise prescription builds to 30-45 minutes a day of aerobic activity, 2-3 days per week.  Home exercise guidelines will be given to patient during program as part of exercise prescription that the participant will acknowledge.  Activity Barriers & Risk Stratification:  Activity Barriers & Cardiac Risk Stratification - 11/19/23 1340       Activity Barriers & Cardiac Risk Stratification   Activity Barriers Deconditioning;Muscular Weakness;Shortness of Breath;Arthritis    Cardiac Risk Stratification Moderate             6 Minute Walk:  6 Minute Walk     Row Name 11/19/23 1421         6 Minute Walk   Phase Initial     Distance 1442 feet     Walk Time 6 minutes     # of Rest Breaks 0     MPH 2.73     METS 3.02     RPE 11     Perceived Dyspnea  1     VO2 Peak 10.56     Symptoms No     Resting HR 92 bpm     Resting BP 132/70     Resting Oxygen Saturation  99 %     Exercise Oxygen Saturation  during 6 min walk 94 %     Max Ex. HR 100 bpm     Max Ex. BP 144/80     2 Minute Post BP 120/70       Interval HR   1 Minute HR 92     2 Minute HR 98     3 Minute HR 98     4 Minute HR 99     5 Minute HR 100  6 Minute HR 96     2 Minute Post HR 84     Interval Heart Rate? Yes       Interval Oxygen   Interval Oxygen?  Yes     Baseline Oxygen Saturation % 99 %     1 Minute Oxygen Saturation % 99 %     1 Minute Liters of Oxygen 0 L     2 Minute Oxygen Saturation % 96 %     2 Minute Liters of Oxygen 0 L     3 Minute Oxygen Saturation % 96 %     3 Minute Liters of Oxygen 0 L     4 Minute Oxygen Saturation % 95 %     4 Minute Liters of Oxygen 0 L     5 Minute Oxygen Saturation % 94 %     5 Minute Liters of Oxygen 0 L     6 Minute Oxygen Saturation % 96 %     6 Minute Liters of Oxygen 0 L     2 Minute Post Oxygen Saturation % 99 %     2 Minute Post Liters of Oxygen 0 L              Oxygen Initial Assessment:  Oxygen Initial Assessment - 11/19/23 1341       Home Oxygen   Home Oxygen Device None    Sleep Oxygen Prescription None    Home Exercise Oxygen Prescription None    Home Resting Oxygen Prescription None      Initial 6 min Walk   Oxygen Used None      Program Oxygen Prescription   Program Oxygen Prescription None      Intervention   Short Term Goals To learn and understand importance of monitoring SPO2 with pulse oximeter and demonstrate accurate use of the pulse oximeter.;To learn and understand importance of maintaining oxygen saturations>88%;To learn and demonstrate proper pursed lip breathing techniques or other breathing techniques. ;To learn and demonstrate proper use of respiratory medications    Long  Term Goals Maintenance of O2 saturations>88%;Compliance with respiratory medication;Verbalizes importance of monitoring SPO2 with pulse oximeter and return demonstration;Exhibits proper breathing techniques, such as pursed lip breathing or other method taught during program session;Demonstrates proper use of MDI's             Oxygen Re-Evaluation:  Oxygen Re-Evaluation     Row Name 11/29/23 0850 12/27/23 0915           Program Oxygen Prescription   Program Oxygen Prescription None None        Home Oxygen   Home Oxygen Device None None      Sleep Oxygen Prescription  None None      Home Exercise Oxygen Prescription None None      Home Resting Oxygen Prescription None None        Goals/Expected Outcomes   Short Term Goals To learn and understand importance of monitoring SPO2 with pulse oximeter and demonstrate accurate use of the pulse oximeter.;To learn and understand importance of maintaining oxygen saturations>88%;To learn and demonstrate proper pursed lip breathing techniques or other breathing techniques. ;To learn and demonstrate proper use of respiratory medications To learn and understand importance of monitoring SPO2 with pulse oximeter and demonstrate accurate use of the pulse oximeter.;To learn and understand importance of maintaining oxygen saturations>88%;To learn and demonstrate proper pursed lip breathing techniques or other breathing techniques. ;To learn and demonstrate proper use of respiratory medications  Long  Term Goals Maintenance of O2 saturations>88%;Compliance with respiratory medication;Verbalizes importance of monitoring SPO2 with pulse oximeter and return demonstration;Exhibits proper breathing techniques, such as pursed lip breathing or other method taught during program session;Demonstrates proper use of MDI's Maintenance of O2 saturations>88%;Compliance with respiratory medication;Verbalizes importance of monitoring SPO2 with pulse oximeter and return demonstration;Exhibits proper breathing techniques, such as pursed lip breathing or other method taught during program session;Demonstrates proper use of MDI's      Goals/Expected Outcomes Compliance and understanding of oxygen saturation monitoring and breathing techniques to decrease shortness of breath. Compliance and understanding of oxygen saturation monitoring and breathing techniques to decrease shortness of breath.               Oxygen Discharge (Final Oxygen Re-Evaluation):  Oxygen Re-Evaluation - 12/27/23 0915       Program Oxygen Prescription   Program Oxygen  Prescription None      Home Oxygen   Home Oxygen Device None    Sleep Oxygen Prescription None    Home Exercise Oxygen Prescription None    Home Resting Oxygen Prescription None      Goals/Expected Outcomes   Short Term Goals To learn and understand importance of monitoring SPO2 with pulse oximeter and demonstrate accurate use of the pulse oximeter.;To learn and understand importance of maintaining oxygen saturations>88%;To learn and demonstrate proper pursed lip breathing techniques or other breathing techniques. ;To learn and demonstrate proper use of respiratory medications    Long  Term Goals Maintenance of O2 saturations>88%;Compliance with respiratory medication;Verbalizes importance of monitoring SPO2 with pulse oximeter and return demonstration;Exhibits proper breathing techniques, such as pursed lip breathing or other method taught during program session;Demonstrates proper use of MDI's    Goals/Expected Outcomes Compliance and understanding of oxygen saturation monitoring and breathing techniques to decrease shortness of breath.             Initial Exercise Prescription:  Initial Exercise Prescription - 11/19/23 1400       Date of Initial Exercise RX and Referring Provider   Date 11/19/23    Referring Provider Everardo All    Expected Discharge Date 02/10/24      Treadmill   MPH 2.5    Grade 1    Minutes 15    METs 3      Elliptical   Level 1    Speed 1    Minutes 15    METs 3      Prescription Details   Frequency (times per week) 2    Duration Progress to 30 minutes of continuous aerobic without signs/symptoms of physical distress      Intensity   THRR 40-80% of Max Heartrate 64-129    Ratings of Perceived Exertion 11-13    Perceived Dyspnea 0-4      Progression   Progression Continue to progress workloads to maintain intensity without signs/symptoms of physical distress.      Resistance Training   Training Prescription Yes    Weight black bands    Reps  10-15             Perform Capillary Blood Glucose checks as needed.  Exercise Prescription Changes:   Exercise Prescription Changes     Row Name 11/23/23 0900 12/07/23 0900 12/09/23 1500 12/21/23 0900 12/28/23 0919     Response to Exercise   Blood Pressure (Admit) 130/70 118/74 -- 122/80 124/62   Blood Pressure (Exercise) 158/80 130/70 -- 174/80 --   Blood Pressure (Exit) 114/70 100/72 -- 120/70 --  Heart Rate (Admit) 84 bpm 79 bpm -- 83 bpm 75 bpm   Heart Rate (Exercise) 131 bpm 126 bpm -- 125 bpm 124 bpm   Heart Rate (Exit) 94 bpm 91 bpm -- 89 bpm 91 bpm   Oxygen Saturation (Admit) 98 % 98 % -- 99 % 98 %   Oxygen Saturation (Exercise) 94 % 95 % -- 95 % 95 %   Oxygen Saturation (Exit) 99 % 98 % -- 98 % 98 %   Rating of Perceived Exertion (Exercise) 13 11 -- 13 11   Perceived Dyspnea (Exercise) 2 2 -- 2 1   Duration Progress to 30 minutes of  aerobic without signs/symptoms of physical distress Continue with 30 min of aerobic exercise without signs/symptoms of physical distress. -- Continue with 30 min of aerobic exercise without signs/symptoms of physical distress. Continue with 30 min of aerobic exercise without signs/symptoms of physical distress.   Intensity THRR unchanged THRR unchanged -- THRR unchanged THRR unchanged     Progression   Progression Continue to progress workloads to maintain intensity without signs/symptoms of physical distress. Continue to progress workloads to maintain intensity without signs/symptoms of physical distress. -- Continue to progress workloads to maintain intensity without signs/symptoms of physical distress. Continue to progress workloads to maintain intensity without signs/symptoms of physical distress.   Average METs -- -- -- -- 4.4     Resistance Training   Training Prescription Yes Yes -- Yes Yes   Weight black bands black bands -- black bands black bands   Reps 10-15 10-15 -- 10-15 10-15   Time 10 Minutes 10 Minutes -- 10 Minutes 10  Minutes     Treadmill   MPH 2.5 3.1 -- 3.1 3.3   Grade 1 2 -- 2 2.5   Minutes 15 15 -- 15 15   METs 3.1 4.1 -- 4.2 4.4     Elliptical   Level 1 2 -- 2 2   Speed 1 1 -- 1 2   Minutes 15 15 -- 15 15   METs 3.9 4.3 -- 4.1 4.4     Home Exercise Plan   Plans to continue exercise at -- -- Home (comment) -- --   Frequency -- -- --  N/A -- --   Initial Home Exercises Provided -- -- 12/09/23 -- --            Exercise Comments:   Exercise Comments     Row Name 11/23/23 1478 12/09/23 1507         Exercise Comments Lachrista completed her first day of exercise. She exercised for 15 min on the elliptical and treadmill. Saoirse averaged 3.9 MET on the elliptical and 3.1 METs on the treadmill. She performed the warmup and cooldown standing without limitations. Completed home ExRx. Avital is currently exercising at home. I encouraged her to continue her home exercise. She walks 5+ days/wk for 30 min/day and does resistance training. I am confident in [place] completing a home exercise regimen. I told her to let me know if she has any questions or needs any assistance. She voiced understanding.               Exercise Goals and Review:   Exercise Goals     Row Name 11/19/23 1340 12/27/23 0915           Exercise Goals   Increase Physical Activity Yes Yes      Intervention Provide advice, education, support and counseling about physical activity/exercise needs.;Develop an individualized exercise  prescription for aerobic and resistive training based on initial evaluation findings, risk stratification, comorbidities and participant's personal goals. Provide advice, education, support and counseling about physical activity/exercise needs.;Develop an individualized exercise prescription for aerobic and resistive training based on initial evaluation findings, risk stratification, comorbidities and participant's personal goals.      Expected Outcomes Short Term: Attend rehab on a regular basis  to increase amount of physical activity.;Long Term: Add in home exercise to make exercise part of routine and to increase amount of physical activity.;Long Term: Exercising regularly at least 3-5 days a week. Short Term: Attend rehab on a regular basis to increase amount of physical activity.;Long Term: Add in home exercise to make exercise part of routine and to increase amount of physical activity.;Long Term: Exercising regularly at least 3-5 days a week.      Increase Strength and Stamina Yes Yes      Intervention Provide advice, education, support and counseling about physical activity/exercise needs.;Develop an individualized exercise prescription for aerobic and resistive training based on initial evaluation findings, risk stratification, comorbidities and participant's personal goals. Provide advice, education, support and counseling about physical activity/exercise needs.;Develop an individualized exercise prescription for aerobic and resistive training based on initial evaluation findings, risk stratification, comorbidities and participant's personal goals.      Expected Outcomes Short Term: Increase workloads from initial exercise prescription for resistance, speed, and METs.;Short Term: Perform resistance training exercises routinely during rehab and add in resistance training at home;Long Term: Improve cardiorespiratory fitness, muscular endurance and strength as measured by increased METs and functional capacity ( ) Short Term: Increase workloads from initial exercise prescription for resistance, speed, and METs.;Short Term: Perform resistance training exercises routinely during rehab and add in resistance training at home;Long Term: Improve cardiorespiratory fitness, muscular endurance and strength as measured by increased METs and functional capacity ( )      Able to understand and use rate of perceived exertion (RPE) scale Yes Yes      Intervention Provide education and explanation on how  to use RPE scale Provide education and explanation on how to use RPE scale      Expected Outcomes Short Term: Able to use RPE daily in rehab to express subjective intensity level;Long Term:  Able to use RPE to guide intensity level when exercising independently Short Term: Able to use RPE daily in rehab to express subjective intensity level;Long Term:  Able to use RPE to guide intensity level when exercising independently      Able to understand and use Dyspnea scale Yes Yes      Intervention Provide education and explanation on how to use Dyspnea scale Provide education and explanation on how to use Dyspnea scale      Expected Outcomes Short Term: Able to use Dyspnea scale daily in rehab to express subjective sense of shortness of breath during exertion;Long Term: Able to use Dyspnea scale to guide intensity level when exercising independently Short Term: Able to use Dyspnea scale daily in rehab to express subjective sense of shortness of breath during exertion;Long Term: Able to use Dyspnea scale to guide intensity level when exercising independently      Knowledge and understanding of Target Heart Rate Range (THRR) Yes Yes      Intervention Provide education and explanation of THRR including how the numbers were predicted and where they are located for reference Provide education and explanation of THRR including how the numbers were predicted and where they are located for reference      Expected  Outcomes Short Term: Able to state/look up THRR;Long Term: Able to use THRR to govern intensity when exercising independently;Short Term: Able to use daily as guideline for intensity in rehab Short Term: Able to state/look up THRR;Long Term: Able to use THRR to govern intensity when exercising independently;Short Term: Able to use daily as guideline for intensity in rehab      Understanding of Exercise Prescription Yes Yes      Intervention Provide education, explanation, and written materials on patient's  individual exercise prescription Provide education, explanation, and written materials on patient's individual exercise prescription      Expected Outcomes Short Term: Able to explain program exercise prescription;Long Term: Able to explain home exercise prescription to exercise independently Short Term: Able to explain program exercise prescription;Long Term: Able to explain home exercise prescription to exercise independently               Exercise Goals Re-Evaluation :  Exercise Goals Re-Evaluation     Row Name 11/29/23 0849 12/27/23 0910           Exercise Goal Re-Evaluation   Exercise Goals Review Increase Physical Activity;Able to understand and use Dyspnea scale;Understanding of Exercise Prescription;Increase Strength and Stamina;Knowledge and understanding of Target Heart Rate Range (THRR);Able to understand and use rate of perceived exertion (RPE) scale Increase Physical Activity;Able to understand and use Dyspnea scale;Understanding of Exercise Prescription;Increase Strength and Stamina;Knowledge and understanding of Target Heart Rate Range (THRR);Able to understand and use rate of perceived exertion (RPE) scale      Comments Stesha has completed 2 exercise sessions. She exercises for 15 min on the upright elliptical and treadmill. She peforms the warmup and cooldown standing without limitations. It is too soon to notate any discernable progressions. Will continue to monitor and progress as able. Theora has completed 9 exercise sessions. She exercises for 15 min on the upright elliptical and treadmill. Keiasia averages 4.6 METs at level 2 and incline of 1 on the upright elliptical and 4.2 METs at 3.1 mph and 2% incline on the treadmill. She peforms the warmup and cooldown standing without limitations. Akshaya has increased her workload for both exercise modes as she tolerates progressions well. We have discussed home exercise as she is exercising outside of rehab. Will continue to monitor  and progress as able.      Expected Outcomes Through exercise at rehab and home, the patient will decrease shortness of breath with daily activities and feel confident in carrying out an exercise regimen at home. Through exercise at rehab and home, the patient will decrease shortness of breath with daily activities and feel confident in carrying out an exercise regimen at home.               Discharge Exercise Prescription (Final Exercise Prescription Changes):  Exercise Prescription Changes - 12/28/23 0919       Response to Exercise   Blood Pressure (Admit) 124/62    Heart Rate (Admit) 75 bpm    Heart Rate (Exercise) 124 bpm    Heart Rate (Exit) 91 bpm    Oxygen Saturation (Admit) 98 %    Oxygen Saturation (Exercise) 95 %    Oxygen Saturation (Exit) 98 %    Rating of Perceived Exertion (Exercise) 11    Perceived Dyspnea (Exercise) 1    Duration Continue with 30 min of aerobic exercise without signs/symptoms of physical distress.    Intensity THRR unchanged      Progression   Progression Continue to progress workloads to maintain  intensity without signs/symptoms of physical distress.    Average METs 4.4      Resistance Training   Training Prescription Yes    Weight black bands    Reps 10-15    Time 10 Minutes      Treadmill   MPH 3.3    Grade 2.5    Minutes 15    METs 4.4      Elliptical   Level 2    Speed 2    Minutes 15    METs 4.4             Nutrition:  Target Goals: Understanding of nutrition guidelines, daily intake of sodium 1500mg , cholesterol 200mg , calories 30% from fat and 7% or less from saturated fats, daily to have 5 or more servings of fruits and vegetables.  Biometrics:  Pre Biometrics - 11/19/23 1415       Pre Biometrics   Grip Strength 40 kg              Nutrition Therapy Plan and Nutrition Goals:   Nutrition Assessments:  MEDIFICTS Score Key: >=70 Need to make dietary changes  40-70 Heart Healthy Diet <= 40  Therapeutic Level Cholesterol Diet   Picture Your Plate Scores: <14 Unhealthy dietary pattern with much room for improvement. 41-50 Dietary pattern unlikely to meet recommendations for good health and room for improvement. 51-60 More healthful dietary pattern, with some room for improvement.  >60 Healthy dietary pattern, although there may be some specific behaviors that could be improved.    Nutrition Goals Re-Evaluation:   Nutrition Goals Discharge (Final Nutrition Goals Re-Evaluation):   Psychosocial: Target Goals: Acknowledge presence or absence of significant depression and/or stress, maximize coping skills, provide positive support system. Participant is able to verbalize types and ability to use techniques and skills needed for reducing stress and depression.  Initial Review & Psychosocial Screening:  Initial Psych Review & Screening - 11/19/23 1338       Initial Review   Current issues with None Identified      Family Dynamics   Good Support System? Yes      Barriers   Psychosocial barriers to participate in program There are no identifiable barriers or psychosocial needs.      Screening Interventions   Interventions Encouraged to exercise    Expected Outcomes Short Term goal: Utilizing psychosocial counselor, staff and physician to assist with identification of specific Stressors or current issues interfering with healing process. Setting desired goal for each stressor or current issue identified.;Long Term Goal: Stressors or current issues are controlled or eliminated.;Short Term goal: Identification and review with participant of any Quality of Life or Depression concerns found by scoring the questionnaire.;Long Term goal: The participant improves quality of Life and PHQ9 Scores as seen by post scores and/or verbalization of changes             Quality of Life Scores:  Scores of 19 and below usually indicate a poorer quality of life in these areas.  A difference  of  2-3 points is a clinically meaningful difference.  A difference of 2-3 points in the total score of the Quality of Life Index has been associated with significant improvement in overall quality of life, self-image, physical symptoms, and general health in studies assessing change in quality of life.  PHQ-9: Review Flowsheet  More data exists      11/19/2023 06/18/2023 07/23/2022 03/24/2022 09/24/2021  Depression screen PHQ 2/9  Decreased Interest 0 1 0 0 0  Down, Depressed, Hopeless 0 0 0 0 0  PHQ - 2 Score 0 1 0 0 0  Altered sleeping 0 1 0 0 -  Tired, decreased energy 2 1 2  0 -  Change in appetite 0 0 0 0 -  Feeling bad or failure about yourself  0 0 0 0 -  Trouble concentrating 0 0 0 0 -  Moving slowly or fidgety/restless 0 0 0 0 -  Suicidal thoughts 0 0 0 0 -  PHQ-9 Score 2 3 2  0 -  Difficult doing work/chores Not difficult at all Somewhat difficult Not difficult at all Not difficult at all -   Interpretation of Total Score  Total Score Depression Severity:  1-4 = Minimal depression, 5-9 = Mild depression, 10-14 = Moderate depression, 15-19 = Moderately severe depression, 20-27 = Severe depression   Psychosocial Evaluation and Intervention:  Psychosocial Evaluation - 11/19/23 1338       Psychosocial Evaluation & Interventions   Interventions Encouraged to exercise with the program and follow exercise prescription    Comments Iyannah denies any psychsocial barriers or concerns at this time.    Expected Outcomes For pt to participate in PR free of any psychosocial barriers or concerns.    Continue Psychosocial Services  No Follow up required             Psychosocial Re-Evaluation:  Psychosocial Re-Evaluation     Row Name 12/08/23 1048 12/29/23 1440           Psychosocial Re-Evaluation   Current issues with None Identified None Identified      Comments At re-evaluation, Donell still denies any psychsocial barriers or concerns at this time. At re-evaluation, Kesa still  denies any psychsocial barriers or concerns at this time.      Expected Outcomes For pt to participate in PR free of any psychosocial barriers or concerns. For pt to participate in PR free of any psychosocial barriers or concerns.      Interventions Encouraged to attend Pulmonary Rehabilitation for the exercise Encouraged to attend Pulmonary Rehabilitation for the exercise      Continue Psychosocial Services  No Follow up required No Follow up required               Psychosocial Discharge (Final Psychosocial Re-Evaluation):  Psychosocial Re-Evaluation - 12/29/23 1440       Psychosocial Re-Evaluation   Current issues with None Identified    Comments At re-evaluation, Kathelyn still denies any psychsocial barriers or concerns at this time.    Expected Outcomes For pt to participate in PR free of any psychosocial barriers or concerns.    Interventions Encouraged to attend Pulmonary Rehabilitation for the exercise    Continue Psychosocial Services  No Follow up required             Education: Education Goals: Education classes will be provided on a weekly basis, covering required topics. Participant will state understanding/return demonstration of topics presented.  Learning Barriers/Preferences:  Learning Barriers/Preferences - 11/19/23 1339       Learning Barriers/Preferences   Learning Barriers None    Learning Preferences None             Education Topics: Know Your Numbers Group instruction that is supported by a PowerPoint presentation. Instructor discusses importance of knowing and understanding resting, exercise, and post-exercise oxygen saturation, heart rate, and blood pressure. Oxygen saturation, heart rate, blood pressure, rating of perceived exertion, and dyspnea are reviewed along with a normal range for these  values.    Exercise for the Pulmonary Patient Group instruction that is supported by a PowerPoint presentation. Instructor discusses benefits of  exercise, core components of exercise, frequency, duration, and intensity of an exercise routine, importance of utilizing pulse oximetry during exercise, safety while exercising, and options of places to exercise outside of rehab.    MET Level  Group instruction provided by PowerPoint, verbal discussion, and written material to support subject matter. Instructor reviews what METs are and how to increase METs.    Pulmonary Medications Verbally interactive group education provided by instructor with focus on inhaled medications and proper administration. Flowsheet Row PULMONARY REHAB CHRONIC OBSTRUCTIVE PULMONARY DISEASE from 08/05/2023 in [facility] for Heart, Vascular, & Lung Health  Date 08/05/23  Educator RT  Instruction Review Code 1- Verbalizes Understanding       Anatomy and Physiology of the Respiratory System Group instruction provided by PowerPoint, verbal discussion, and written material to support subject matter. Instructor reviews respiratory cycle and anatomical components of the respiratory system and their functions. Instructor also reviews differences in obstructive and restrictive respiratory diseases with examples of each.  Flowsheet Row PULMONARY REHAB CHRONIC OBSTRUCTIVE PULMONARY DISEASE from 07/29/2023 in [facility] for Heart, Vascular, & Lung Health  Date 07/29/23  Educator RT  Instruction Review Code 1- Verbalizes Understanding       Oxygen Safety Group instruction provided by PowerPoint, verbal discussion, and written material to support subject matter. There is an overview of "What is Oxygen" and "Why do we need it".  Instructor also reviews how to create a safe environment for oxygen use, the importance of using oxygen as prescribed, and the risks of noncompliance. There is a brief discussion on traveling with oxygen and resources the patient may utilize. Flowsheet Row PULMONARY REHAB CHRONIC OBSTRUCTIVE PULMONARY  DISEASE from 11/25/2023 in [facility] for Heart, Vascular, & Lung Health  Date 11/25/23  Educator RN  Instruction Review Code 1- Verbalizes Understanding       Oxygen Use Group instruction provided by PowerPoint, verbal discussion, and written material to discuss how supplemental oxygen is prescribed and different types of oxygen supply systems. Resources for more information are provided.  Flowsheet Row PULMONARY REHAB CHRONIC OBSTRUCTIVE PULMONARY DISEASE from 12/02/2023 in [facility] for Heart, Vascular, & Lung Health  Date 12/02/23  Educator RT  Instruction Review Code 1- Verbalizes Understanding       Breathing Techniques Group instruction that is supported by demonstration and informational handouts. Instructor discusses the benefits of pursed lip and diaphragmatic breathing and detailed demonstration on how to perform both.  Flowsheet Row PULMONARY REHAB CHRONIC OBSTRUCTIVE PULMONARY DISEASE from 12/09/2023 in [facility] for Heart, Vascular, & Lung Health  Date 12/09/23  Educator RN  Instruction Review Code 1- Verbalizes Understanding        Risk Factor Reduction Group instruction that is supported by a PowerPoint presentation. Instructor discusses the definition of a risk factor, different risk factors for pulmonary disease, and how the heart and lungs work together.   Pulmonary Diseases Group instruction provided by PowerPoint, verbal discussion, and written material to support subject matter. Instructor gives an overview of the different type of pulmonary diseases. There is also a discussion on risk factors and symptoms as well as ways to manage the diseases.   Stress and Energy Conservation Group instruction provided by PowerPoint, verbal discussion, and written material to support subject matter. Instructor gives an  overview of stress and the impact it can have on the body. Instructor also reviews  ways to reduce stress. There is also a discussion on energy conservation and ways to conserve energy throughout the day. Flowsheet Row PULMONARY REHAB CHRONIC OBSTRUCTIVE PULMONARY DISEASE from 12/16/2023 in [facility] for Heart, Vascular, & Lung Health  Date 12/16/23  Educator RN  Instruction Review Code 1- Verbalizes Understanding       Warning Signs and Symptoms Group instruction provided by PowerPoint, verbal discussion, and written material to support subject matter. Instructor reviews warning signs and symptoms of stroke, heart attack, cold and flu. Instructor also reviews ways to prevent the spread of infection. Flowsheet Row PULMONARY REHAB CHRONIC OBSTRUCTIVE PULMONARY DISEASE from 12/23/2023 in [facility] for Heart, Vascular, & Lung Health  Date 12/23/23  Educator RN  Instruction Review Code 1- Verbalizes Understanding       Other Education Group or individual verbal, written, or video instructions that support the educational goals of the pulmonary rehab program. Flowsheet Row PULMONARY REHAB CHRONIC OBSTRUCTIVE PULMONARY DISEASE from 07/22/2023 in [facility] for Heart, Vascular, & Lung Health  Date 07/22/23  Educator RT  Instruction Review Code 1- Verbalizes Understanding        Knowledge Questionnaire Score:  Knowledge Questionnaire Score - 11/19/23 1331       Knowledge Questionnaire Score   Pre Score 15/18             Core Components/Risk Factors/Patient Goals at Admission:  Personal Goals and Risk Factors at Admission - 11/19/23 1339       Core Components/Risk Factors/Patient Goals on Admission   Improve shortness of breath with ADL's Yes    Intervention Provide education, individualized exercise plan and daily activity instruction to help decrease symptoms of SOB with activities of daily living.    Expected Outcomes Short Term: Improve cardiorespiratory fitness to achieve a reduction  of symptoms when performing ADLs;Long Term: Be able to perform more ADLs without symptoms or delay the onset of symptoms    Increase knowledge of respiratory medications and ability to use respiratory devices properly  Yes    Intervention Provide education and demonstration as needed of appropriate use of medications, inhalers, and oxygen therapy.    Expected Outcomes Short Term: Achieves understanding of medications use. Understands that oxygen is a medication prescribed by physician. Demonstrates appropriate use of inhaler and oxygen therapy.;Long Term: Maintain appropriate use of medications, inhalers, and oxygen therapy.             Core Components/Risk Factors/Patient Goals Review:   Goals and Risk Factor Review     Row Name 12/08/23 1050 12/29/23 1440           Core Components/Risk Factors/Patient Goals Review   Personal Goals Review Improve shortness of breath with ADL's;Develop more efficient breathing techniques such as purse lipped breathing and diaphragmatic breathing and practicing self-pacing with activity.;Increase knowledge of respiratory medications and ability to use respiratory devices properly. Improve shortness of breath with ADL's;Develop more efficient breathing techniques such as purse lipped breathing and diaphragmatic breathing and practicing self-pacing with activity.      Review Monthly review of patient's Core Components/Risk Factors/Patient Goals are as follows: Goal in progress for improving her shortness of breath with ADLs and developing more efficient breathing techniques such as purse lipped breathing and diaphragmatic breathing; and practicing self-pacing with activity. Eyvette has been able to increase her workload and METS while maintaining her  oxygen saturation on room air. This is her 3rd time attending the program and she is familiar with the routine, how to stop and purse lip breathe and how to pace herself. She is building up her endurance and stamina to  complete activities of daily living at home with decreased shortness of breath. Goal met for increasing her knowledge of respiratory medications and the ability to use respiratory devices properly. Our respiratory therapist has reviewed medications with Zyon, Grout has demonstrated how to use the inhalers, when to use them and has verbalized side effects correctly. Juliann stated she is confidant in using her medications and now understands what they are used for. Kaysi will continue to benefit from participation in [place] for nutrition, education, exercise, and lifestyle modification. Monthly review of patient's Core Components/Risk Factors/Patient Goals are as follows: Goal in progress for improving her shortness of breath with ADLs. Lelah has been able to increase her workload and METS while maintaining her oxygen saturation on room air. She can report her dyspnea and rate of perceived exertion scores to staff. Goal met on developing more efficient breathing techniques such as purse lipped breathing and diaphragmatic breathing; and practicing self-pacing with activity. Tomma is familiar with how to stop and purse lip breathe and how to pace herself with exertion. She correctly performs diaphragmatic breathing techniques. She is building up her endurance and stamina to complete activities of daily living at home with decreased shortness of breath. Lakecia will continue to benefit from participation in [place] for nutrition, education, exercise, and lifestyle modification.      Expected Outcomes For Sara to improve her shortness of breath with ADLs, develop more efficient breathing techniques such as purse lipped breathing and diaphragmatic breathing; and practicing self-pacing with activity and improve shortness of breath with ADL's For Marlynn to improve her shortness of breath with ADLs, develop more efficient breathing techniques such as purse lipped breathing and diaphragmatic breathing; and practicing  self-pacing with activity and improve shortness of breath with ADL's               Core Components/Risk Factors/Patient Goals at Discharge (Final Review):   Goals and Risk Factor Review - 12/29/23 1440       Core Components/Risk Factors/Patient Goals Review   Personal Goals Review Improve shortness of breath with ADL's;Develop more efficient breathing techniques such as purse lipped breathing and diaphragmatic breathing and practicing self-pacing with activity.    Review Monthly review of patient's Core Components/Risk Factors/Patient Goals are as follows: Goal in progress for improving her shortness of breath with ADLs. Chery has been able to increase her workload and METS while maintaining her oxygen saturation on room air. She can report her dyspnea and rate of perceived exertion scores to staff. Goal met on developing more efficient breathing techniques such as purse lipped breathing and diaphragmatic breathing; and practicing self-pacing with activity. Romana is familiar with how to stop and purse lip breathe and how to pace herself with exertion. She correctly performs diaphragmatic breathing techniques. She is building up her endurance and stamina to complete activities of daily living at home with decreased shortness of breath. Bresha will continue to benefit from participation in [place] for nutrition, education, exercise, and lifestyle modification.    Expected Outcomes For Freda to improve her shortness of breath with ADLs, develop more efficient breathing techniques such as purse lipped breathing and diaphragmatic breathing; and practicing self-pacing with activity and improve shortness of breath with ADL's  ITP Comments: Pt is making expected progress toward Pulmonary Rehab goals after completing 10 session(s). Recommend continued exercise, life style modification, education, and utilization of breathing techniques to increase stamina and strength, while also decreasing  shortness of breath with exertion.  Dr. Mechele Collin is Medical Director for Pulmonary Rehab at [facility].

## 2024-01-06 ENCOUNTER — Encounter (HOSPITAL_COMMUNITY): Payer: Medicaid Other

## 2024-01-11 ENCOUNTER — Encounter (HOSPITAL_COMMUNITY)
Admission: RE | Admit: 2024-01-11 | Discharge: 2024-01-11 | Disposition: A | Discharge status: Home / Self Care | Payer: Medicaid Other | Source: Ambulatory Visit | Attending: Pulmonary Disease

## 2024-01-11 DIAGNOSIS — J449 Chronic obstructive pulmonary disease, unspecified: Secondary | ICD-10-CM

## 2024-01-11 NOTE — Progress Notes (Signed)
 Daily Session Note  Patient Details  Name: Caitlin [name] MRN: [id number] Date of Birth: [birth year] Referring Provider:   Doristine Devoid Pulmonary Rehab Walk Test from 11/19/2023 in [facility] for Heart, Vascular, & Lung Health  Referring Provider Everardo All       Encounter Date: 01/11/2024  Check In:  Session Check In - 01/11/24 8295       Check-In   Supervising physician immediately available to respond to emergencies [facility] MD immediately available    Physician(s) Robin Searing, NP    Location [facility]    Staff Present Durel Salts, Zella Richer, MS, ACSM-CEP, Exercise Physiologist    Virtual Visit No    Medication changes reported     No    Fall or balance concerns reported    No    Tobacco Cessation No Change    Warm-up and Cool-down Performed as group-led instruction    Resistance Training Performed Yes    VAD Patient? No    PAD/SET Patient? No      Pain Assessment   Currently in Pain? No/denies    Multiple Pain Sites No             Capillary Blood Glucose: No results found for this or any previous visit (from the past 24 hours).    Social History   Tobacco Use  Smoking Status Former   Current packs/day: 0.00   Types: Cigarettes   Quit date: 08/16/2017   Years since quitting: 6.4  Smokeless Tobacco Never  Tobacco Comments   quit 07/12/17, abstaining 8/24    Goals Met:  Proper associated with RPD/PD & O2 Sat Independence with exercise equipment Exercise tolerated well No report of concerns or symptoms today Strength training completed today  Goals Unmet:  Not Applicable  Comments: Service time is from 0804 to 0931.    Dr. Mechele Collin is Medical Director for Pulmonary Rehab at [facility].

## 2024-01-13 ENCOUNTER — Encounter (HOSPITAL_COMMUNITY)
Admission: RE | Admit: 2024-01-13 | Discharge: 2024-01-13 | Disposition: A | Discharge status: Home / Self Care | Payer: Medicaid Other | Source: Ambulatory Visit | Attending: Pulmonary Disease | Admitting: Pulmonary Disease

## 2024-01-13 DIAGNOSIS — J449 Chronic obstructive pulmonary disease, unspecified: Secondary | ICD-10-CM | POA: Diagnosis not present

## 2024-01-13 NOTE — Progress Notes (Signed)
 Daily Session Note  Patient Details  Name: Caitlin [name] MRN: [id number] Date of Birth: 04-16-1966 Referring Provider:   Doristine Devoid Pulmonary Rehab Walk Test from 11/19/2023 in [facility] for Heart, Vascular, & Lung Health  Referring Provider Everardo All       Encounter Date: 01/13/2024  Check In:  Session Check In - 01/13/24 4098       Check-In   Supervising physician immediately available to respond to emergencies [facility] MD immediately available    Physician(s) Bernadene Person, NP    Location [facility]    Staff Present Durel Salts, Zella Richer, MS, ACSM-CEP, Exercise Physiologist;Randi Dionisio Paschal, ACSM-CEP, Exercise Physiologist;Samantha Belarus, RD, Dutch Gray, RN, BSN    Virtual Visit No    Medication changes reported     No    Fall or balance concerns reported    No    Tobacco Cessation No Change    Warm-up and Cool-down Performed as group-led Writer Performed Yes    VAD Patient? No    PAD/SET Patient? No      Pain Assessment   Currently in Pain? No/denies             Capillary Blood Glucose: No results found for this or any previous visit (from the past 24 hours).    Social History   Tobacco Use  Smoking Status Former   Current packs/day: 0.00   Types: Cigarettes   Quit date: 08/16/2017   Years since quitting: 6.4  Smokeless Tobacco Never  Tobacco Comments   quit 07/12/17, abstaining 8/24    Goals Met:  Proper associated with RPD/PD & O2 Sat Independence with exercise equipment Exercise tolerated well No report of concerns or symptoms today Strength training completed today  Goals Unmet:  Not Applicable  Comments: Service time is from 0801 to 0934.    Dr. Mechele Collin is Medical Director for Pulmonary Rehab at [facility].

## 2024-01-18 ENCOUNTER — Encounter (HOSPITAL_COMMUNITY)
Admission: RE | Admit: 2024-01-18 | Discharge: 2024-01-18 | Disposition: A | Discharge status: Home / Self Care | Payer: Medicaid Other | Source: Ambulatory Visit | Attending: Pulmonary Disease | Admitting: Pulmonary Disease

## 2024-01-18 VITALS — Wt 218.3 lb

## 2024-01-18 DIAGNOSIS — J449 Chronic obstructive pulmonary disease, unspecified: Secondary | ICD-10-CM

## 2024-01-18 NOTE — Progress Notes (Signed)
 Daily Session Note  Patient Details  Name: Caitlin [name] MRN: [id number] Date of Birth: October 19, 1966 Referring Provider:   Doristine Devoid Pulmonary Rehab Walk Test from 11/19/2023 in [facility] for Heart, Vascular, & Lung Health  Referring Provider Everardo All       Encounter Date: 01/18/2024  Check In:  Session Check In - 01/18/24 4098       Check-In   Supervising physician immediately available to respond to emergencies [facility] MD immediately available    Physician(s) Bernadene Person, NP    Location [facility]    Staff Present Durel Salts, Zella Richer, MS, ACSM-CEP, Exercise Physiologist;Randi Dionisio Paschal, ACSM-CEP, Exercise Physiologist;Samantha Belarus, RD, Dutch Gray, RN, BSN    Virtual Visit No    Medication changes reported     No    Fall or balance concerns reported    No    Tobacco Cessation No Change    Warm-up and Cool-down Performed as group-led Writer Performed Yes    VAD Patient? No    PAD/SET Patient? No      Pain Assessment   Currently in Pain? No/denies    Multiple Pain Sites No             Capillary Blood Glucose: No results found for this or any previous visit (from the past 24 hours).   Exercise Prescription Changes - 01/18/24 0900       Response to Exercise   Blood Pressure (Admit) 104/72    Blood Pressure (Exercise) 156/68    Blood Pressure (Exit) 104/70    Heart Rate (Admit) 70 bpm    Heart Rate (Exercise) 124 bpm    Heart Rate (Exit) 80 bpm    Oxygen Saturation (Admit) 98 %    Oxygen Saturation (Exercise) 94 %    Oxygen Saturation (Exit) 98 %    Rating of Perceived Exertion (Exercise) 11    Perceived Dyspnea (Exercise) 2    Duration Continue with 30 min of aerobic exercise without signs/symptoms of physical distress.    Intensity THRR unchanged      Progression   Progression Continue to progress workloads to maintain intensity without signs/symptoms of physical distress.       Resistance Training   Training Prescription Yes    Weight black bands    Reps 10-15    Time 10 Minutes      Treadmill   MPH 3    Grade 2.5    Minutes 15    METs 3.9      Elliptical   Level 2    Speed 2    Minutes 15    METs 4.3             Social History   Tobacco Use  Smoking Status Former   Current packs/day: 0.00   Types: Cigarettes   Quit date: 08/16/2017   Years since quitting: 6.4  Smokeless Tobacco Never  Tobacco Comments   quit 07/12/17, abstaining 8/24    Goals Met:  Proper associated with RPD/PD & O2 Sat Independence with exercise equipment Exercise tolerated well No report of concerns or symptoms today Strength training completed today  Goals Unmet:  Not Applicable  Comments: Service time is from 0806 to 0923.    Dr. Mechele Collin is Medical Director for Pulmonary Rehab at [facility].

## 2024-01-20 ENCOUNTER — Encounter (HOSPITAL_COMMUNITY)
Admission: RE | Admit: 2024-01-20 | Discharge: 2024-01-20 | Disposition: A | Discharge status: Home / Self Care | Payer: Medicaid Other | Source: Ambulatory Visit | Attending: Pulmonary Disease | Admitting: Pulmonary Disease

## 2024-01-20 DIAGNOSIS — J449 Chronic obstructive pulmonary disease, unspecified: Secondary | ICD-10-CM

## 2024-01-20 NOTE — Progress Notes (Signed)
 Daily Session Note  Patient Details  Name: Caitlin [name] MRN: [id number] Date of Birth: 04-19-1966 Referring Provider:   Doristine Devoid Pulmonary Rehab Walk Test from 11/19/2023 in [facility]  Referring Provider Everardo All       Encounter Date: 01/20/2024  Check In:  Session Check In - 01/20/24 4098       Check-In   Supervising physician immediately available to respond to emergencies [facility] MD immediately available    Physician(s) Robin Searing, NP    Location [facility]    Staff Present Durel Salts, Zella Richer, MS, ACSM-CEP, Exercise Physiologist;Randi Dionisio Paschal, ACSM-CEP, Exercise Physiologist;Samantha Belarus, [place], [place] Gray, RN, BSN    Virtual Visit No    Medication changes reported     No    Fall or balance concerns reported    No    Tobacco Cessation No Change    Warm-up and Cool-down Performed as group-led instruction    Resistance Training Performed Yes    VAD Patient? No    PAD/SET Patient? No      Pain Assessment   Currently in Pain? No/denies             Capillary Blood Glucose: No results found for this or any previous visit (from the past 24 hours).    Social History   Tobacco Use  Smoking Status Former   Current packs/day: 0.00   Types: Cigarettes   Quit date: 08/16/2017   Years since quitting: 6.4  Smokeless Tobacco Never  Tobacco Comments   quit 07/12/17, abstaining 8/24    Goals Met:  Proper associated with RPD/PD & O2 Sat Independence with exercise equipment Exercise tolerated well No report of concerns or symptoms today Strength training completed today  Goals Unmet:  Not Applicable  Comments: Service time is from 0807 to 0933.    Dr. Mechele Collin is Medical Director for Pulmonary Rehab at [facility]

## 2024-01-25 ENCOUNTER — Encounter (HOSPITAL_COMMUNITY)
Admission: RE | Admit: 2024-01-25 | Discharge: 2024-01-25 | Disposition: A | Discharge status: Home / Self Care | Payer: Medicaid Other | Source: Ambulatory Visit | Attending: Pulmonary Disease | Admitting: Pulmonary Disease

## 2024-01-25 ENCOUNTER — Telehealth (HOSPITAL_COMMUNITY): Payer: Self-pay | Admitting: *Deleted

## 2024-01-25 NOTE — Telephone Encounter (Signed)
 Caitlin [name] left message on department voicemail last night. She will be absent from pulmonary rehab today.

## 2024-01-27 ENCOUNTER — Encounter (HOSPITAL_COMMUNITY)
Admission: RE | Admit: 2024-01-27 | Discharge: 2024-01-27 | Disposition: A | Discharge status: Home / Self Care | Payer: Medicaid Other | Source: Ambulatory Visit | Attending: Pulmonary Disease | Admitting: Pulmonary Disease

## 2024-01-27 DIAGNOSIS — J449 Chronic obstructive pulmonary disease, unspecified: Secondary | ICD-10-CM

## 2024-01-27 NOTE — Progress Notes (Signed)
 Daily Session Note  Patient Details  Name: Caitlin [name] MRN: [id number] Date of Birth: 25-Mar-1966 Referring Provider:   Doristine Devoid Pulmonary Rehab Walk Test from 11/19/2023 in [facility]  Referring Provider Everardo All       Encounter Date: 01/27/2024  Check In:  Session Check In - 01/27/24 0846       Check-In   Supervising physician immediately available to respond to emergencies [facility] MD immediately available    Physician(s) Oris Drone, NP    Location [facility]    Staff Present Durel Salts, Zella Richer, MS, ACSM-CEP, Exercise Physiologist;Randi Dionisio Paschal, ACSM-CEP, Exercise Physiologist;Samantha Belarus, RD, LDN    Virtual Visit No    Medication changes reported     No    Fall or balance concerns reported    No    Tobacco Cessation No Change    Warm-up and Cool-down Performed as group-led instruction    Resistance Training Performed Yes    VAD Patient? No    PAD/SET Patient? No      Pain Assessment   Currently in Pain? No/denies    Multiple Pain Sites No             Capillary Blood Glucose: No results found for this or any previous visit (from the past 24 hours).    Social History   Tobacco Use  Smoking Status Former   Current packs/day: 0.00   Types: Cigarettes   Quit date: 08/16/2017   Years since quitting: 6.4  Smokeless Tobacco Never  Tobacco Comments   quit 07/12/17, abstaining 8/24    Goals Met:  Proper associated with RPD/PD & O2 Sat Independence with exercise equipment Exercise tolerated well No report of concerns or symptoms today Strength training completed today  Goals Unmet:  Not Applicable  Comments: Service time is from 0807 to 0932.    Dr. Mechele Collin is Medical Director for Pulmonary Rehab at [facility].

## 2024-02-01 ENCOUNTER — Encounter (HOSPITAL_COMMUNITY)
Admission: RE | Admit: 2024-02-01 | Discharge: 2024-02-01 | Disposition: A | Discharge status: Home / Self Care | Payer: Medicaid Other | Source: Ambulatory Visit | Attending: Pulmonary Disease | Admitting: Pulmonary Disease

## 2024-02-01 VITALS — Wt 218.7 lb

## 2024-02-01 DIAGNOSIS — J449 Chronic obstructive pulmonary disease, unspecified: Secondary | ICD-10-CM | POA: Diagnosis not present

## 2024-02-01 NOTE — Progress Notes (Signed)
 Daily Session Note  Patient Details  Name: Caitlin [name] MRN: [id number] Date of Birth: 1966/11/03 Referring Provider:   Doristine Devoid Pulmonary Rehab Walk Test from 11/19/2023 in [facility]  Referring Provider Everardo All       Encounter Date: 02/01/2024  Check In:  Session Check In - 02/01/24 4098       Check-In   Supervising physician immediately available to respond to emergencies [facility] MD immediately available    Physician(s) Oris Drone, NP    Location [facility]    Staff Present Durel Salts, Zella Richer, MS, ACSM-CEP, Exercise Physiologist;Shriya Aker Dionisio Paschal, ACSM-CEP, Exercise Physiologist;Mary Gerre Scull, RN, BSN    Virtual Visit No    Medication changes reported     No    Fall or balance concerns reported    No    Tobacco Cessation No Change    Warm-up and Cool-down Performed as group-led instruction    Resistance Training Performed Yes    VAD Patient? No    PAD/SET Patient? No      Pain Assessment   Currently in Pain? No/denies             Capillary Blood Glucose: No results found for this or any previous visit (from the past 24 hours).   Exercise Prescription Changes - 02/01/24 0900       Response to Exercise   Blood Pressure (Admit) 116/72    Blood Pressure (Exercise) 142/68    Blood Pressure (Exit) 102/64    Heart Rate (Admit) 70 bpm    Heart Rate (Exercise) 116 bpm    Heart Rate (Exit) 82 bpm    Oxygen Saturation (Admit) 98 %    Oxygen Saturation (Exercise) 95 %    Oxygen Saturation (Exit) 98 %    Rating of Perceived Exertion (Exercise) 11    Perceived Dyspnea (Exercise) 1    Duration Continue with 30 min of aerobic exercise without signs/symptoms of physical distress.    Intensity THRR unchanged      Progression   Progression Continue to progress workloads to maintain intensity without signs/symptoms of physical distress.    Average METs 4.6      Resistance Training    Training Prescription Yes    Weight black bands    Reps 10-15    Time 10 Minutes      Treadmill   MPH 3.3    Grade 2.5    Minutes 15    METs 4.4      Elliptical   Level 3    Speed 2    Minutes 15    METs 4.6             Social History   Tobacco Use  Smoking Status Former   Current packs/day: 0.00   Types: Cigarettes   Quit date: 08/16/2017   Years since quitting: 6.4  Smokeless Tobacco Never  Tobacco Comments   quit 07/12/17, abstaining 8/24    Goals Met:  Independence with exercise equipment Exercise tolerated well No report of concerns or symptoms today Strength training completed today  Goals Unmet:  Not Applicable  Comments: Service time is from 0811 to [phone].    Dr. Mechele Collin is Medical Director for Pulmonary Rehab at [facility].

## 2024-02-02 NOTE — Progress Notes (Signed)
 Pulmonary Individual Treatment Plan  Patient Details  Name: Caitlin [name] MRN: [id number] Date of Birth: 11-28-1965 Referring Provider:   Doristine Devoid Pulmonary Rehab Walk Test from 11/19/2023 in [facility]  Referring Provider Everardo All       Initial Encounter Date:  Flowsheet Row Pulmonary Rehab Walk Test from 11/19/2023 in [facility]  Date 11/19/23       Visit Diagnosis: Stage 2 moderate COPD by GOLD classification (HCC)  Patient's Home Medications on Admission:   Current Outpatient Medications:    albuterol (VENTOLIN HFA) 108 (90 Base) MCG/ACT inhaler, Inhale 2 puffs into the lungs every 4 (four) hours as needed for wheezing or shortness of breath., Disp: 8 g, Rfl: 0   atorvastatin (LIPITOR) 10 MG tablet, Take 1 tablet (10 mg total) by mouth daily., Disp: 90 tablet, Rfl: 1   METAMUCIL FIBER PO, Take 3 tablets by mouth daily., Disp: , Rfl:    Omega-3 Fatty Acids (FISH OIL PO), Take 1 capsule by mouth daily., Disp: , Rfl:    umeclidinium-vilanterol (ANORO ELLIPTA) 62.5-25 MCG/ACT AEPB, Inhale 1 puff into the lungs daily at 6 (six) AM., Disp: 60 each, Rfl: 11   Vitamin D, Ergocalciferol, (DRISDOL) 1.25 MG (50000 UNIT) CAPS capsule, Take 1 capsule (50,000 Units total) by mouth every 7 (seven) days., Disp: 12 capsule, Rfl: 0  Past Medical History: Past Medical History:  Diagnosis Date   CHF (congestive heart failure) (HCC)    COPD (chronic obstructive pulmonary disease) (HCC)    HLD (hyperlipidemia)    tx w/atorvastatin   NSTEMI (non-ST elevated myocardial infarction) (HCC)    pt denies   Takotsubo cardiomyopathy     Tobacco Use: Social History   Tobacco Use  Smoking Status Former   Current packs/day: 0.00   Types: Cigarettes   Quit date: 08/16/2017   Years since quitting: 6.4  Smokeless Tobacco Never  Tobacco Comments   quit 07/12/17, abstaining 8/24     Labs: Review Flowsheet  More data exists      Latest Ref Rng & Units 04/16/2020 03/17/2021 01/06/2022 03/24/2022 04/06/2023  Labs for ITP Cardiac and Pulmonary Rehab  Cholestrol 100 - 199 mg/dL 595  638  - 756  433   LDL (calc) 0 - 99 mg/dL 295  188  - 98  97   HDL-C >39 mg/dL 52  45  - 43  38   Trlycerides 0 - 149 mg/dL 95  73  - 416  606   Hemoglobin A1c 4.8 - 5.6 % - - - - 6.1   PH, Arterial 7.35 - 7.45 - - 7.42  - -  PCO2 arterial 32 - 48 mmHg - - 37  - -  Bicarbonate 20.0 - 28.0 mmol/L - - 24.0  - -  Acid-base deficit 0.0 - 2.0 mmol/L - - 0.2  - -  O2 Saturation % - - 98.9  - -    Capillary Blood Glucose: Lab Results  Component Value Date   GLUCAP 79 12/16/2021   GLUCAP 94 09/28/2017   GLUCAP 95 09/28/2017   GLUCAP 108 (H) 09/27/2017   GLUCAP 89 09/27/2017     Pulmonary Assessment Scores:  Pulmonary Assessment Scores     Row Name 11/19/23 1354         ADL UCSD   ADL Phase Entry     SOB Score total 36  CAT Score   CAT Score 12       mMRC Score   mMRC Score 2             UCSD: Self-administered rating of dyspnea associated with activities of daily living (ADLs) 6-point scale (0 = "not at all" to 5 = "maximal or unable to do because of breathlessness")  Scoring Scores range from 0 to 120.  Minimally important difference is 5 units  CAT: CAT can identify the health impairment of COPD patients and is better correlated with disease progression.  CAT has a scoring range of zero to 40. The CAT score is classified into four groups of low (less than 10), medium (10 - 20), high (21-30) and very high (31-40) based on the impact level of disease on health status. A CAT score over 10 suggests significant symptoms.  A worsening CAT score could be explained by an exacerbation, poor medication adherence, poor inhaler technique, or progression of COPD or comorbid conditions.  CAT MCID is 2 points  mMRC: mMRC (Modified Medical Research Council) Dyspnea Scale is used  to assess the degree of baseline functional disability in patients of respiratory disease due to dyspnea. No minimal important difference is established. A decrease in score of 1 point or greater is considered a positive change.   Pulmonary Function Assessment:  Pulmonary Function Assessment - 11/19/23 1354       Breath   Bilateral Breath Sounds Clear;Decreased    Shortness of Breath Yes;Limiting activity             Exercise Target Goals: Exercise Program Goal: Individual exercise prescription set using results from initial 6 min walk test and THRR while considering  patient's activity barriers and safety.   Exercise Prescription Goal: Initial exercise prescription builds to 30-45 minutes a day of aerobic activity, 2-3 days per week.  Home exercise guidelines will be given to patient during program as part of exercise prescription that the participant will acknowledge.  Activity Barriers & Risk Stratification:  Activity Barriers & Cardiac Risk Stratification - 11/19/23 1340       Activity Barriers & Cardiac Risk Stratification   Activity Barriers Deconditioning;Muscular Weakness;Shortness of Breath;Arthritis    Cardiac Risk Stratification Moderate             6 Minute Walk:  6 Minute Walk     Row Name 11/19/23 1421         6 Minute Walk   Phase Initial     Distance 1442 feet     Walk Time 6 minutes     # of Rest Breaks 0     MPH 2.73     METS 3.02     RPE 11     Perceived Dyspnea  1     VO2 Peak 10.56     Symptoms No     Resting HR 92 bpm     Resting BP 132/70     Resting Oxygen Saturation  99 %     Exercise Oxygen Saturation  during 6 min walk 94 %     Max Ex. HR 100 bpm     Max Ex. BP 144/80     2 Minute Post BP 120/70       Interval HR   1 Minute HR 92     2 Minute HR 98     3 Minute HR 98     4 Minute HR 99     5 Minute HR 100  6 Minute HR 96     2 Minute Post HR 84     Interval Heart Rate? Yes       Interval Oxygen   Interval Oxygen?  Yes     Baseline Oxygen Saturation % 99 %     1 Minute Oxygen Saturation % 99 %     1 Minute Liters of Oxygen 0 L     2 Minute Oxygen Saturation % 96 %     2 Minute Liters of Oxygen 0 L     3 Minute Oxygen Saturation % 96 %     3 Minute Liters of Oxygen 0 L     4 Minute Oxygen Saturation % 95 %     4 Minute Liters of Oxygen 0 L     5 Minute Oxygen Saturation % 94 %     5 Minute Liters of Oxygen 0 L     6 Minute Oxygen Saturation % 96 %     6 Minute Liters of Oxygen 0 L     2 Minute Post Oxygen Saturation % 99 %     2 Minute Post Liters of Oxygen 0 L              Oxygen Initial Assessment:  Oxygen Initial Assessment - 11/19/23 1341       Home Oxygen   Home Oxygen Device None    Sleep Oxygen Prescription None    Home Exercise Oxygen Prescription None    Home Resting Oxygen Prescription None      Initial 6 min Walk   Oxygen Used None      Program Oxygen Prescription   Program Oxygen Prescription None      Intervention   Short Term Goals To learn and understand importance of monitoring SPO2 with pulse oximeter and demonstrate accurate use of the pulse oximeter.;To learn and understand importance of maintaining oxygen saturations>88%;To learn and demonstrate proper pursed lip breathing techniques or other breathing techniques. ;To learn and demonstrate proper use of respiratory medications    Long  Term Goals Maintenance of O2 saturations>88%;Compliance with respiratory medication;Verbalizes importance of monitoring SPO2 with pulse oximeter and return demonstration;Exhibits proper breathing techniques, such as pursed lip breathing or other method taught during program session;Demonstrates proper use of MDI's             Oxygen Re-Evaluation:  Oxygen Re-Evaluation     Row Name 11/29/23 0850 12/27/23 0915 01/26/24 0947         Program Oxygen Prescription   Program Oxygen Prescription None None None       Home Oxygen   Home Oxygen Device None None None     Sleep  Oxygen Prescription None None None     Home Exercise Oxygen Prescription None None None     Home Resting Oxygen Prescription None None None       Goals/Expected Outcomes   Short Term Goals To learn and understand importance of monitoring SPO2 with pulse oximeter and demonstrate accurate use of the pulse oximeter.;To learn and understand importance of maintaining oxygen saturations>88%;To learn and demonstrate proper pursed lip breathing techniques or other breathing techniques. ;To learn and demonstrate proper use of respiratory medications To learn and understand importance of monitoring SPO2 with pulse oximeter and demonstrate accurate use of the pulse oximeter.;To learn and understand importance of maintaining oxygen saturations>88%;To learn and demonstrate proper pursed lip breathing techniques or other breathing techniques. ;To learn and demonstrate proper use of respiratory medications To learn and  understand importance of monitoring SPO2 with pulse oximeter and demonstrate accurate use of the pulse oximeter.;To learn and understand importance of maintaining oxygen saturations>88%;To learn and demonstrate proper pursed lip breathing techniques or other breathing techniques. ;To learn and demonstrate proper use of respiratory medications     Long  Term Goals Maintenance of O2 saturations>88%;Compliance with respiratory medication;Verbalizes importance of monitoring SPO2 with pulse oximeter and return demonstration;Exhibits proper breathing techniques, such as pursed lip breathing or other method taught during program session;Demonstrates proper use of MDI's Maintenance of O2 saturations>88%;Compliance with respiratory medication;Verbalizes importance of monitoring SPO2 with pulse oximeter and return demonstration;Exhibits proper breathing techniques, such as pursed lip breathing or other method taught during program session;Demonstrates proper use of MDI's Maintenance of O2 saturations>88%;Compliance with  respiratory medication;Verbalizes importance of monitoring SPO2 with pulse oximeter and return demonstration;Exhibits proper breathing techniques, such as pursed lip breathing or other method taught during program session;Demonstrates proper use of MDI's     Goals/Expected Outcomes Compliance and understanding of oxygen saturation monitoring and breathing techniques to decrease shortness of breath. Compliance and understanding of oxygen saturation monitoring and breathing techniques to decrease shortness of breath. Compliance and understanding of oxygen saturation monitoring and breathing techniques to decrease shortness of breath.              Oxygen Discharge (Final Oxygen Re-Evaluation):  Oxygen Re-Evaluation - 01/26/24 0947       Program Oxygen Prescription   Program Oxygen Prescription None      Home Oxygen   Home Oxygen Device None    Sleep Oxygen Prescription None    Home Exercise Oxygen Prescription None    Home Resting Oxygen Prescription None      Goals/Expected Outcomes   Short Term Goals To learn and understand importance of monitoring SPO2 with pulse oximeter and demonstrate accurate use of the pulse oximeter.;To learn and understand importance of maintaining oxygen saturations>88%;To learn and demonstrate proper pursed lip breathing techniques or other breathing techniques. ;To learn and demonstrate proper use of respiratory medications    Long  Term Goals Maintenance of O2 saturations>88%;Compliance with respiratory medication;Verbalizes importance of monitoring SPO2 with pulse oximeter and return demonstration;Exhibits proper breathing techniques, such as pursed lip breathing or other method taught during program session;Demonstrates proper use of MDI's    Goals/Expected Outcomes Compliance and understanding of oxygen saturation monitoring and breathing techniques to decrease shortness of breath.             Initial Exercise Prescription:  Initial Exercise  Prescription - 11/19/23 1400       Date of Initial Exercise RX and Referring Provider   Date 11/19/23    Referring Provider Everardo All    Expected Discharge Date 02/10/24      Treadmill   MPH 2.5    Grade 1    Minutes 15    METs 3      Elliptical   Level 1    Speed 1    Minutes 15    METs 3      Prescription Details   Frequency (times per week) 2    Duration Progress to 30 minutes of continuous aerobic without signs/symptoms of physical distress      Intensity   THRR 40-80% of Max Heartrate 64-129    Ratings of Perceived Exertion 11-13    Perceived Dyspnea 0-4      Progression   Progression Continue to progress workloads to maintain intensity without signs/symptoms of physical distress.      Resistance  Training   Training Prescription Yes    Weight black bands    Reps 10-15             Perform Capillary Blood Glucose checks as needed.  Exercise Prescription Changes:   Exercise Prescription Changes     Row Name 11/23/23 0900 12/07/23 0900 12/09/23 1500 12/21/23 0900 12/28/23 0919     Response to Exercise   Blood Pressure (Admit) 130/70 118/74 -- 122/80 124/62   Blood Pressure (Exercise) 158/80 130/70 -- 174/80 --   Blood Pressure (Exit) 114/70 100/72 -- 120/70 --   Heart Rate (Admit) 84 bpm 79 bpm -- 83 bpm 75 bpm   Heart Rate (Exercise) 131 bpm 126 bpm -- 125 bpm 124 bpm   Heart Rate (Exit) 94 bpm 91 bpm -- 89 bpm 91 bpm   Oxygen Saturation (Admit) 98 % 98 % -- 99 % 98 %   Oxygen Saturation (Exercise) 94 % 95 % -- 95 % 95 %   Oxygen Saturation (Exit) 99 % 98 % -- 98 % 98 %   Rating of Perceived Exertion (Exercise) 13 11 -- 13 11   Perceived Dyspnea (Exercise) 2 2 -- 2 1   Duration Progress to 30 minutes of  aerobic without signs/symptoms of physical distress Continue with 30 min of aerobic exercise without signs/symptoms of physical distress. -- Continue with 30 min of aerobic exercise without signs/symptoms of physical distress. Continue with 30 min of  aerobic exercise without signs/symptoms of physical distress.   Intensity THRR unchanged THRR unchanged -- THRR unchanged THRR unchanged     Progression   Progression Continue to progress workloads to maintain intensity without signs/symptoms of physical distress. Continue to progress workloads to maintain intensity without signs/symptoms of physical distress. -- Continue to progress workloads to maintain intensity without signs/symptoms of physical distress. Continue to progress workloads to maintain intensity without signs/symptoms of physical distress.   Average METs -- -- -- -- 4.4     Resistance Training   Training Prescription Yes Yes -- Yes Yes   Weight black bands black bands -- black bands black bands   Reps 10-15 10-15 -- 10-15 10-15   Time 10 Minutes 10 Minutes -- 10 Minutes 10 Minutes     Treadmill   MPH 2.5 3.1 -- 3.1 3.3   Grade 1 2 -- 2 2.5   Minutes 15 15 -- 15 15   METs 3.1 4.1 -- 4.2 4.4     Elliptical   Level 1 2 -- 2 2   Speed 1 1 -- 1 2   Minutes 15 15 -- 15 15   METs 3.9 4.3 -- 4.1 4.4     Home Exercise Plan   Plans to continue exercise at -- -- Home (comment) -- --   Frequency -- -- --  N/A -- --   Initial Home Exercises Provided -- -- 12/09/23 -- --    Row Name 01/18/24 0900 02/01/24 0900           Response to Exercise   Blood Pressure (Admit) 104/72 116/72      Blood Pressure (Exercise) 156/68 142/68      Blood Pressure (Exit) 104/70 102/64      Heart Rate (Admit) 70 bpm 70 bpm      Heart Rate (Exercise) 124 bpm 116 bpm      Heart Rate (Exit) 80 bpm 82 bpm      Oxygen Saturation (Admit) 98 % 98 %      Oxygen  Saturation (Exercise) 94 % 95 %      Oxygen Saturation (Exit) 98 % 98 %      Rating of Perceived Exertion (Exercise) 11 11      Perceived Dyspnea (Exercise) 2 1      Duration Continue with 30 min of aerobic exercise without signs/symptoms of physical distress. Continue with 30 min of aerobic exercise without signs/symptoms of physical  distress.      Intensity THRR unchanged THRR unchanged        Progression   Progression Continue to progress workloads to maintain intensity without signs/symptoms of physical distress. Continue to progress workloads to maintain intensity without signs/symptoms of physical distress.      Average METs -- 4.6        Resistance Training   Training Prescription Yes Yes      Weight black bands black bands      Reps 10-15 10-15      Time 10 Minutes 10 Minutes        Treadmill   MPH 3 3.3      Grade 2.5 2.5      Minutes 15 15      METs 3.9 4.4        Elliptical   Level 2 3      Speed 2 2      Minutes 15 15      METs 4.3 4.6               Exercise Comments:   Exercise Comments     Row Name 11/23/23 1610 12/09/23 1507         Exercise Comments Reather completed her first day of exercise. She exercised for 15 min on the elliptical and treadmill. Kurt averaged 3.9 MET on the elliptical and 3.1 METs on the treadmill. She performed the warmup and cooldown standing without limitations. Completed home ExRx. Marylen is currently exercising at home. I encouraged her to continue her home exercise. She walks 5+ days/wk for 30 min/day and does resistance training. I am confident in Salida completing a home exercise regimen. I told her to let me know if she has any questions or needs any assistance. She voiced understanding.               Exercise Goals and Review:   Exercise Goals     Row Name 11/19/23 1340 12/27/23 0915           Exercise Goals   Increase Physical Activity Yes Yes      Intervention Provide advice, education, support and counseling about physical activity/exercise needs.;Develop an individualized exercise prescription for aerobic and resistive training based on initial evaluation findings, risk stratification, comorbidities and participant's personal goals. Provide advice, education, support and counseling about physical activity/exercise needs.;Develop an  individualized exercise prescription for aerobic and resistive training based on initial evaluation findings, risk stratification, comorbidities and participant's personal goals.      Expected Outcomes Short Term: Attend rehab on a regular basis to increase amount of physical activity.;Long Term: Add in home exercise to make exercise part of routine and to increase amount of physical activity.;Long Term: Exercising regularly at least 3-5 days a week. Short Term: Attend rehab on a regular basis to increase amount of physical activity.;Long Term: Add in home exercise to make exercise part of routine and to increase amount of physical activity.;Long Term: Exercising regularly at least 3-5 days a week.      Increase Strength and Stamina Yes Yes  Intervention Provide advice, education, support and counseling about physical activity/exercise needs.;Develop an individualized exercise prescription for aerobic and resistive training based on initial evaluation findings, risk stratification, comorbidities and participant's personal goals. Provide advice, education, support and counseling about physical activity/exercise needs.;Develop an individualized exercise prescription for aerobic and resistive training based on initial evaluation findings, risk stratification, comorbidities and participant's personal goals.      Expected Outcomes Short Term: Increase workloads from initial exercise prescription for resistance, speed, and METs.;Short Term: Perform resistance training exercises routinely during rehab and add in resistance training at home;Long Term: Improve cardiorespiratory fitness, muscular endurance and strength as measured by increased METs and functional capacity ( ) Short Term: Increase workloads from initial exercise prescription for resistance, speed, and METs.;Short Term: Perform resistance training exercises routinely during rehab and add in resistance training at home;Long Term: Improve  cardiorespiratory fitness, muscular endurance and strength as measured by increased METs and functional capacity ( )      Able to understand and use rate of perceived exertion (RPE) scale Yes Yes      Intervention Provide education and explanation on how to use RPE scale Provide education and explanation on how to use RPE scale      Expected Outcomes Short Term: Able to use RPE daily in rehab to express subjective intensity level;Long Term:  Able to use RPE to guide intensity level when exercising independently Short Term: Able to use RPE daily in rehab to express subjective intensity level;Long Term:  Able to use RPE to guide intensity level when exercising independently      Able to understand and use Dyspnea scale Yes Yes      Intervention Provide education and explanation on how to use Dyspnea scale Provide education and explanation on how to use Dyspnea scale      Expected Outcomes Short Term: Able to use Dyspnea scale daily in rehab to express subjective sense of shortness of breath during exertion;Long Term: Able to use Dyspnea scale to guide intensity level when exercising independently Short Term: Able to use Dyspnea scale daily in rehab to express subjective sense of shortness of breath during exertion;Long Term: Able to use Dyspnea scale to guide intensity level when exercising independently      Knowledge and understanding of Target Heart Rate Range (THRR) Yes Yes      Intervention Provide education and explanation of THRR including how the numbers were predicted and where they are located for reference Provide education and explanation of THRR including how the numbers were predicted and where they are located for reference      Expected Outcomes Short Term: Able to state/look up THRR;Long Term: Able to use THRR to govern intensity when exercising independently;Short Term: Able to use daily as guideline for intensity in rehab Short Term: Able to state/look up THRR;Long Term: Able to use THRR  to govern intensity when exercising independently;Short Term: Able to use daily as guideline for intensity in rehab      Understanding of Exercise Prescription Yes Yes      Intervention Provide education, explanation, and written materials on patient's individual exercise prescription Provide education, explanation, and written materials on patient's individual exercise prescription      Expected Outcomes Short Term: Able to explain program exercise prescription;Long Term: Able to explain home exercise prescription to exercise independently Short Term: Able to explain program exercise prescription;Long Term: Able to explain home exercise prescription to exercise independently  Exercise Goals Re-Evaluation :  Exercise Goals Re-Evaluation     Row Name 11/29/23 0849 12/27/23 0910 01/26/24 0943         Exercise Goal Re-Evaluation   Exercise Goals Review Increase Physical Activity;Able to understand and use Dyspnea scale;Understanding of Exercise Prescription;Increase Strength and Stamina;Knowledge and understanding of Target Heart Rate Range (THRR);Able to understand and use rate of perceived exertion (RPE) scale Increase Physical Activity;Able to understand and use Dyspnea scale;Understanding of Exercise Prescription;Increase Strength and Stamina;Knowledge and understanding of Target Heart Rate Range (THRR);Able to understand and use rate of perceived exertion (RPE) scale Increase Physical Activity;Able to understand and use Dyspnea scale;Understanding of Exercise Prescription;Increase Strength and Stamina;Knowledge and understanding of Target Heart Rate Range (THRR);Able to understand and use rate of perceived exertion (RPE) scale     Comments Kida has completed 2 exercise sessions. She exercises for 15 min on the upright elliptical and treadmill. She peforms the warmup and cooldown standing without limitations. It is too soon to notate any discernable progressions. Will continue to  monitor and progress as able. Salma has completed 9 exercise sessions. She exercises for 15 min on the upright elliptical and treadmill. Gavyn averages 4.6 METs at level 2 and incline of 1 on the upright elliptical and 4.2 METs at 3.1 mph and 2% incline on the treadmill. She peforms the warmup and cooldown standing without limitations. Srah has increased her workload for both exercise modes as she tolerates progressions well. We have discussed home exercise as she is exercising outside of rehab. Will continue to monitor and progress as able. Teya has completed 14 exercise sessions. She exercises for 15 min on the upright elliptical and treadmill. Ammarie averages 4.2 METs at level 2 and incline of 2 on the upright elliptical and 4.5 METs at 3.3 mph and 2.5% incline on the treadmill. She peforms the warmup and cooldown standing without limitations. Ionna has increased her incline on the upright elliptical and speed and incline on the treadmill. METs have remained the same on the upright elliptical and increased on the treadmill. Preethi has missed a few sessions due to personal commitments. Will continue to monitor and progess as able.     Expected Outcomes Through exercise at rehab and home, the patient will decrease shortness of breath with daily activities and feel confident in carrying out an exercise regimen at home. Through exercise at rehab and home, the patient will decrease shortness of breath with daily activities and feel confident in carrying out an exercise regimen at home. Through exercise at rehab and home, the patient will decrease shortness of breath with daily activities and feel confident in carrying out an exercise regimen at home.              Discharge Exercise Prescription (Final Exercise Prescription Changes):  Exercise Prescription Changes - 02/01/24 0900       Response to Exercise   Blood Pressure (Admit) 116/72    Blood Pressure (Exercise) 142/68    Blood Pressure  (Exit) 102/64    Heart Rate (Admit) 70 bpm    Heart Rate (Exercise) 116 bpm    Heart Rate (Exit) 82 bpm    Oxygen Saturation (Admit) 98 %    Oxygen Saturation (Exercise) 95 %    Oxygen Saturation (Exit) 98 %    Rating of Perceived Exertion (Exercise) 11    Perceived Dyspnea (Exercise) 1    Duration Continue with 30 min of aerobic exercise without signs/symptoms of physical distress.    Intensity  THRR unchanged      Progression   Progression Continue to progress workloads to maintain intensity without signs/symptoms of physical distress.    Average METs 4.6      Resistance Training   Training Prescription Yes    Weight black bands    Reps 10-15    Time 10 Minutes      Treadmill   MPH 3.3    Grade 2.5    Minutes 15    METs 4.4      Elliptical   Level 3    Speed 2    Minutes 15    METs 4.6             Nutrition:  Target Goals: Understanding of nutrition guidelines, daily intake of sodium 1500mg , cholesterol 200mg , calories 30% from fat and 7% or less from saturated fats, daily to have 5 or more servings of fruits and vegetables.  Biometrics:  Pre Biometrics - 11/19/23 1415       Pre Biometrics   Grip Strength 40 kg              Nutrition Therapy Plan and Nutrition Goals:  Nutrition Therapy & Goals - 01/24/24 0931       Nutrition Therapy   Diet Heart healthy Diet    Drug/Food Interactions Statins/Certain Fruits      Personal Nutrition Goals   Nutrition Goal Patient to improve diet quality by using the plate method as a guide for meal planning to include lean protein/plant protein, fruits, vegetables, whole grains, nonfat dairy as part of a well-balanced diet.   goal in progress.   Personal Goal #2 Patient to identify strategies for weight loss of 0.5-2.0# per week.   goal in progress.   Comments Goals in progress. Dejanee has medical history of Takotsubo cardiomyopathy, hyperlipidemia, preDM, COPD1.  She reports motivation to lose weight to improve  pre-diabetes. She is down 2.2# since starting with our program; will continue to follow-up to address strategies to aid with weight loss/blood sugar management. She works part time as a Lawyer. Patient will continue to benefit from participation in pulmonary rehab for nutrition, exercise, and lifestyle modification.      Intervention Plan   Intervention Prescribe, educate and counsel regarding individualized specific dietary modifications aiming towards targeted core components such as weight, hypertension, lipid management, diabetes, heart failure and other comorbidities.;Nutrition handout(s) given to patient.    Expected Outcomes Short Term Goal: Understand basic principles of dietary content, such as calories, fat, sodium, cholesterol and nutrients.;Long Term Goal: Adherence to prescribed nutrition plan.             Nutrition Assessments:  MEDIFICTS Score Key: >=70 Need to make dietary changes  40-70 Heart Healthy Diet <= 40 Therapeutic Level Cholesterol Diet   Picture Your Plate Scores: <04 Unhealthy dietary pattern with much room for improvement. 41-50 Dietary pattern unlikely to meet recommendations for good health and room for improvement. 51-60 More healthful dietary pattern, with some room for improvement.  >60 Healthy dietary pattern, although there may be some specific behaviors that could be improved.    Nutrition Goals Re-Evaluation:  Nutrition Goals Re-Evaluation     Row Name 01/24/24 0931             Goals   Current Weight 218 lb 11.1 oz (99.2 kg)  weight from last attended session on 01/20/23       Comment LDL 97, HDL 38, A1c 6.1       Expected Outcome  Goals in progress. Kalista has medical history of Takotsubo cardiomyopathy, hyperlipidemia, preDM, COPD1. She reports motivation to lose weight to improve pre-diabetes. She is down 2.2# since starting with our program; will continue to follow-up to address strategies to aid with weight loss/blood sugar management. She  works part time as a Lawyer. Patient will continue to benefit from participation in pulmonary rehab for nutrition, exercise, and lifestyle modification.                Nutrition Goals Discharge (Final Nutrition Goals Re-Evaluation):  Nutrition Goals Re-Evaluation - 01/24/24 0931       Goals   Current Weight 218 lb 11.1 oz (99.2 kg)   weight from last attended session on 01/20/23   Comment LDL 97, HDL 38, A1c 6.1    Expected Outcome Goals in progress. Candid has medical history of Takotsubo cardiomyopathy, hyperlipidemia, preDM, COPD1. She reports motivation to lose weight to improve pre-diabetes. She is down 2.2# since starting with our program; will continue to follow-up to address strategies to aid with weight loss/blood sugar management. She works part time as a Lawyer. Patient will continue to benefit from participation in pulmonary rehab for nutrition, exercise, and lifestyle modification.             Psychosocial: Target Goals: Acknowledge presence or absence of significant depression and/or stress, maximize coping skills, provide positive support system. Participant is able to verbalize types and ability to use techniques and skills needed for reducing stress and depression.  Initial Review & Psychosocial Screening:  Initial Psych Review & Screening - 11/19/23 1338       Initial Review   Current issues with None Identified      Family Dynamics   Good Support System? Yes      Barriers   Psychosocial barriers to participate in program There are no identifiable barriers or psychosocial needs.      Screening Interventions   Interventions Encouraged to exercise    Expected Outcomes Short Term goal: Utilizing psychosocial counselor, staff and physician to assist with identification of specific Stressors or current issues interfering with healing process. Setting desired goal for each stressor or current issue identified.;Long Term Goal: Stressors or current issues are controlled or  eliminated.;Short Term goal: Identification and review with participant of any Quality of Life or Depression concerns found by scoring the questionnaire.;Long Term goal: The participant improves quality of Life and PHQ9 Scores as seen by post scores and/or verbalization of changes             Quality of Life Scores:  Scores of 19 and below usually indicate a poorer quality of life in these areas.  A difference of  2-3 points is a clinically meaningful difference.  A difference of 2-3 points in the total score of the Quality of Life Index has been associated with significant improvement in overall quality of life, self-image, physical symptoms, and general health in studies assessing change in quality of life.  PHQ-9: Review Flowsheet  More data exists      11/19/2023 06/18/2023 07/23/2022 03/24/2022 09/24/2021  Depression screen PHQ 2/9  Decreased Interest 0 1 0 0 0  Down, Depressed, Hopeless 0 0 0 0 0  PHQ - 2 Score 0 1 0 0 0  Altered sleeping 0 1 0 0 -  Tired, decreased energy 2 1 2  0 -  Change in appetite 0 0 0 0 -  Feeling bad or failure about yourself  0 0 0 0 -  Trouble concentrating  0 0 0 0 -  Moving slowly or fidgety/restless 0 0 0 0 -  Suicidal thoughts 0 0 0 0 -  PHQ-9 Score 2 3 2  0 -  Difficult doing work/chores Not difficult at all Somewhat difficult Not difficult at all Not difficult at all -   Interpretation of Total Score  Total Score Depression Severity:  1-4 = Minimal depression, 5-9 = Mild depression, 10-14 = Moderate depression, 15-19 = Moderately severe depression, 20-27 = Severe depression   Psychosocial Evaluation and Intervention:  Psychosocial Evaluation - 11/19/23 1338       Psychosocial Evaluation & Interventions   Interventions Encouraged to exercise with the program and follow exercise prescription    Comments Saliha denies any psychsocial barriers or concerns at this time.    Expected Outcomes For pt to participate in PR free of any psychosocial barriers  or concerns.    Continue Psychosocial Services  No Follow up required             Psychosocial Re-Evaluation:  Psychosocial Re-Evaluation     Row Name 12/08/23 1048 12/29/23 1440 01/24/24 1348         Psychosocial Re-Evaluation   Current issues with None Identified None Identified None Identified     Comments At re-evaluation, Marialice still denies any psychsocial barriers or concerns at this time. At re-evaluation, Natalynn still denies any psychsocial barriers or concerns at this time. Dickie still denies any psychosocial barriers or concerns at this time.     Expected Outcomes For pt to participate in PR free of any psychosocial barriers or concerns. For pt to participate in PR free of any psychosocial barriers or concerns. For pt to participate in PR free of any psychosocial barriers or concerns.     Interventions Encouraged to attend Pulmonary Rehabilitation for the exercise Encouraged to attend Pulmonary Rehabilitation for the exercise Encouraged to attend Pulmonary Rehabilitation for the exercise     Continue Psychosocial Services  No Follow up required No Follow up required No Follow up required              Psychosocial Discharge (Final Psychosocial Re-Evaluation):  Psychosocial Re-Evaluation - 01/24/24 1348       Psychosocial Re-Evaluation   Current issues with None Identified    Comments Brigitt still denies any psychosocial barriers or concerns at this time.    Expected Outcomes For pt to participate in PR free of any psychosocial barriers or concerns.    Interventions Encouraged to attend Pulmonary Rehabilitation for the exercise    Continue Psychosocial Services  No Follow up required             Education: Education Goals: Education classes will be provided on a weekly basis, covering required topics. Participant will state understanding/return demonstration of topics presented.  Learning Barriers/Preferences:  Learning Barriers/Preferences - 11/19/23 1339        Learning Barriers/Preferences   Learning Barriers None    Learning Preferences None             Education Topics: Know Your Numbers Group instruction that is supported by a PowerPoint presentation. Instructor discusses importance of knowing and understanding resting, exercise, and post-exercise oxygen saturation, heart rate, and blood pressure. Oxygen saturation, heart rate, blood pressure, rating of perceived exertion, and dyspnea are reviewed along with a normal range for these values.    Exercise for the Pulmonary Patient Group instruction that is supported by a PowerPoint presentation. Instructor discusses benefits of exercise, core components of exercise, frequency,  duration, and intensity of an exercise routine, importance of utilizing pulse oximetry during exercise, safety while exercising, and options of places to exercise outside of rehab.    MET Level  Group instruction provided by PowerPoint, verbal discussion, and written material to support subject matter. Instructor reviews what METs are and how to increase METs.  Flowsheet Row PULMONARY REHAB CHRONIC OBSTRUCTIVE PULMONARY DISEASE from 01/11/2024 in [facility] for Heart, Vascular, & Lung Health  Date 01/11/24  Educator EP  Instruction Review Code 1- Verbalizes Understanding       Pulmonary Medications Verbally interactive group education provided by instructor with focus on inhaled medications and proper administration. Flowsheet Row PULMONARY REHAB CHRONIC OBSTRUCTIVE PULMONARY DISEASE from 08/05/2023 in [facility] for Heart, Vascular, & Lung Health  Date 08/05/23  Educator RT  Instruction Review Code 1- Verbalizes Understanding       Anatomy and Physiology of the Respiratory System Group instruction provided by PowerPoint, verbal discussion, and written material to support subject matter. Instructor reviews respiratory cycle and anatomical components of  the respiratory system and their functions. Instructor also reviews differences in obstructive and restrictive respiratory diseases with examples of each.  Flowsheet Row PULMONARY REHAB CHRONIC OBSTRUCTIVE PULMONARY DISEASE from 01/20/2024 in [facility] for Heart, Vascular, & Lung Health  Date 01/20/24  Educator RT  Instruction Review Code 1- Verbalizes Understanding       Oxygen Safety Group instruction provided by PowerPoint, verbal discussion, and written material to support subject matter. There is an overview of "What is Oxygen" and "Why do we need it".  Instructor also reviews how to create a safe environment for oxygen use, the importance of using oxygen as prescribed, and the risks of noncompliance. There is a brief discussion on traveling with oxygen and resources the patient may utilize. Flowsheet Row PULMONARY REHAB CHRONIC OBSTRUCTIVE PULMONARY DISEASE from 11/25/2023 in [facility] for Heart, Vascular, & Lung Health  Date 11/25/23  Educator RN  Instruction Review Code 1- Verbalizes Understanding       Oxygen Use Group instruction provided by PowerPoint, verbal discussion, and written material to discuss how supplemental oxygen is prescribed and different types of oxygen supply systems. Resources for more information are provided.  Flowsheet Row PULMONARY REHAB CHRONIC OBSTRUCTIVE PULMONARY DISEASE from 12/02/2023 in [facility] for Heart, Vascular, & Lung Health  Date 12/02/23  Educator RT  Instruction Review Code 1- Verbalizes Understanding       Breathing Techniques Group instruction that is supported by demonstration and informational handouts. Instructor discusses the benefits of pursed lip and diaphragmatic breathing and detailed demonstration on how to perform both.  Flowsheet Row PULMONARY REHAB CHRONIC OBSTRUCTIVE PULMONARY DISEASE from 12/09/2023 in [facility] for Heart,  Vascular, & Lung Health  Date 12/09/23  Educator RN  Instruction Review Code 1- Verbalizes Understanding        Risk Factor Reduction Group instruction that is supported by a PowerPoint presentation. Instructor discusses the definition of a risk factor, different risk factors for pulmonary disease, and how the heart and lungs work together.   Pulmonary Diseases Group instruction provided by PowerPoint, verbal discussion, and written material to support subject matter. Instructor gives an overview of the different type of pulmonary diseases. There is also a discussion on risk factors and symptoms as well as ways to manage the diseases. Flowsheet Row PULMONARY REHAB CHRONIC OBSTRUCTIVE PULMONARY DISEASE from 01/13/2024 in [facility] for Heart, Vascular, & Lung Health  Date 01/13/24  Educator RT  Instruction Review Code 1- Verbalizes Understanding       Stress and Energy Conservation Group instruction provided by PowerPoint, verbal discussion, and written material to support subject matter. Instructor gives an overview of stress and the impact it can have on the body. Instructor also reviews ways to reduce stress. There is also a discussion on energy conservation and ways to conserve energy throughout the day. Flowsheet Row PULMONARY REHAB CHRONIC OBSTRUCTIVE PULMONARY DISEASE from 12/16/2023 in [facility] for Heart, Vascular, & Lung Health  Date 12/16/23  Educator RN  Instruction Review Code 1- Verbalizes Understanding       Warning Signs and Symptoms Group instruction provided by PowerPoint, verbal discussion, and written material to support subject matter. Instructor reviews warning signs and symptoms of stroke, heart attack, cold and flu. Instructor also reviews ways to prevent the spread of infection. Flowsheet Row PULMONARY REHAB CHRONIC OBSTRUCTIVE PULMONARY DISEASE from 12/23/2023 in [facility] for Heart,  Vascular, & Lung Health  Date 12/23/23  Educator RN  Instruction Review Code 1- Verbalizes Understanding       Other Education Group or individual verbal, written, or video instructions that support the educational goals of the pulmonary rehab program. Flowsheet Row PULMONARY REHAB CHRONIC OBSTRUCTIVE PULMONARY DISEASE from 07/22/2023 in [facility] for Heart, Vascular, & Lung Health  Date 07/22/23  Educator RT  Instruction Review Code 1- Verbalizes Understanding        Knowledge Questionnaire Score:  Knowledge Questionnaire Score - 11/19/23 1331       Knowledge Questionnaire Score   Pre Score 15/18             Core Components/Risk Factors/Patient Goals at Admission:  Personal Goals and Risk Factors at Admission - 11/19/23 1339       Core Components/Risk Factors/Patient Goals on Admission   Improve shortness of breath with ADL's Yes    Intervention Provide education, individualized exercise plan and daily activity instruction to help decrease symptoms of SOB with activities of daily living.    Expected Outcomes Short Term: Improve cardiorespiratory fitness to achieve a reduction of symptoms when performing ADLs;Long Term: Be able to perform more ADLs without symptoms or delay the onset of symptoms    Increase knowledge of respiratory medications and ability to use respiratory devices properly  Yes    Intervention Provide education and demonstration as needed of appropriate use of medications, inhalers, and oxygen therapy.    Expected Outcomes Short Term: Achieves understanding of medications use. Understands that oxygen is a medication prescribed by physician. Demonstrates appropriate use of inhaler and oxygen therapy.;Long Term: Maintain appropriate use of medications, inhalers, and oxygen therapy.             Core Components/Risk Factors/Patient Goals Review:   Goals and Risk Factor Review     Row Name 12/08/23 1050 12/29/23 1440 01/24/24  1348         Core Components/Risk Factors/Patient Goals Review   Personal Goals Review Improve shortness of breath with ADL's;Develop more efficient breathing techniques such as purse lipped breathing and diaphragmatic breathing and practicing self-pacing with activity.;Increase knowledge of respiratory medications and ability to use respiratory devices properly. Improve shortness of breath with ADL's;Develop more efficient breathing techniques such as purse lipped breathing and diaphragmatic breathing and practicing self-pacing with activity. Improve shortness of breath with ADL's  Review Monthly review of patient's Core Components/Risk Factors/Patient Goals are as follows: Goal in progress for improving her shortness of breath with ADLs and developing more efficient breathing techniques such as purse lipped breathing and diaphragmatic breathing; and practicing self-pacing with activity. Sybel has been able to increase her workload and METS while maintaining her oxygen saturation on room air. This is her 3rd time attending the program and she is familiar with the routine, how to stop and purse lip breathe and how to pace herself. She is building up her endurance and stamina to complete activities of daily living at home with decreased shortness of breath. Goal met for increasing her knowledge of respiratory medications and the ability to use respiratory devices properly. Our respiratory therapist has reviewed medications with Shannara, Winbush has demonstrated how to use the inhalers, when to use them and has verbalized side effects correctly. Elize stated she is confidant in using her medications and now understands what they are used for. Shemiah will continue to benefit from participation in [place] for nutrition, education, exercise, and lifestyle modification. Monthly review of patient's Core Components/Risk Factors/Patient Goals are as follows: Goal in progress for improving her shortness of breath with  ADLs. Jennine has been able to increase her workload and METS while maintaining her oxygen saturation on room air. She can report her dyspnea and rate of perceived exertion scores to staff. Goal met on developing more efficient breathing techniques such as purse lipped breathing and diaphragmatic breathing; and practicing self-pacing with activity. Avonelle is familiar with how to stop and purse lip breathe and how to pace herself with exertion. She correctly performs diaphragmatic breathing techniques. She is building up her endurance and stamina to complete activities of daily living at home with decreased shortness of breath. Ani will continue to benefit from participation in [place] for nutrition, education, exercise, and lifestyle modification. Goal in progress for improving her shortness of breath with ADLs. Abbigayle is currently exercising on the Elliptical and the treadmill. She has been able to increase her workload and METS while maintaining her oxygen saturation on room air. She can report her dyspnea and rate of perceived exertion scores to staff.  Edra will continue to benefit from participation in [place] for nutrition, education, exercise, and lifestyle modification.     Expected Outcomes For Kiera to improve her shortness of breath with ADLs, develop more efficient breathing techniques such as purse lipped breathing and diaphragmatic breathing; and practicing self-pacing with activity and improve shortness of breath with ADL's For Harleyquinn to improve her shortness of breath with ADLs, develop more efficient breathing techniques such as purse lipped breathing and diaphragmatic breathing; and practicing self-pacing with activity and improve shortness of breath with ADL's For Sumiya to improve her shortness of breath with ADLs              Core Components/Risk Factors/Patient Goals at Discharge (Final Review):   Goals and Risk Factor Review - 01/24/24 1348       Core Components/Risk Factors/Patient  Goals Review   Personal Goals Review Improve shortness of breath with ADL's    Review Goal in progress for improving her shortness of breath with ADLs. Roselene is currently exercising on the Elliptical and the treadmill. She has been able to increase her workload and METS while maintaining her oxygen saturation on room air. She can report her dyspnea and rate of perceived exertion scores to staff.  [place] will continue to benefit from participation in [place] for nutrition, education, exercise, and  lifestyle modification.    Expected Outcomes For Alease to improve her shortness of breath with ADLs             ITP Comments: Pt is making expected progress toward Pulmonary Rehab goals after completing 16 session(s). Recommend continued exercise, life style modification, education, and utilization of breathing techniques to increase stamina and strength, while also decreasing shortness of breath with exertion.    Comments: Dr. Mechele Collin is Medical Director for Pulmonary Rehab at [facility].

## 2024-02-03 ENCOUNTER — Encounter (HOSPITAL_COMMUNITY): Admission: RE | Admit: 2024-02-03 | Payer: Medicaid Other | Source: Ambulatory Visit

## 2024-02-03 ENCOUNTER — Telehealth (HOSPITAL_COMMUNITY): Payer: Self-pay | Admitting: *Deleted

## 2024-02-03 NOTE — Telephone Encounter (Signed)
 Pt LVM stating that she has a family member in the hospital and will not be able to attend her PR appt today. She plans to return on Tuesday 02/08/24.  Ethelda Chick BS, ACSM-CEP 02/03/2024 8:39 AM

## 2024-02-08 ENCOUNTER — Encounter (HOSPITAL_COMMUNITY)
Admission: RE | Admit: 2024-02-08 | Discharge: 2024-02-08 | Disposition: A | Discharge status: Home / Self Care | Payer: Medicaid Other | Source: Ambulatory Visit | Attending: Pulmonary Disease

## 2024-02-08 DIAGNOSIS — J449 Chronic obstructive pulmonary disease, unspecified: Secondary | ICD-10-CM | POA: Diagnosis not present

## 2024-02-08 NOTE — Progress Notes (Signed)
 Daily Session Note  Patient Details  Name: Caitlin [name] MRN: [id number] Date of Birth: Sep 05, 1966 Referring Provider:   Doristine Devoid Pulmonary Rehab Walk Test from 11/19/2023 in [facility]  Referring Provider Everardo All       Encounter Date: 02/08/2024  Check In:  Session Check In - 02/08/24 0816       Check-In   Supervising physician immediately available to respond to emergencies [facility] MD immediately available    Physician(s) Hoover Browns, NP    Location [facility]    Staff Present Durel Salts, Zella Richer, MS, ACSM-CEP, Exercise Physiologist;Randi Dionisio Paschal, ACSM-CEP, Exercise Physiologist;Mary Gerre Scull, RN, BSN    Virtual Visit No    Medication changes reported     No    Fall or balance concerns reported    No    Tobacco Cessation No Change    Warm-up and Cool-down Performed as group-led instruction    Resistance Training Performed Yes    VAD Patient? No    PAD/SET Patient? No      Pain Assessment   Currently in Pain? No/denies    Multiple Pain Sites No             Capillary Blood Glucose: No results found for this or any previous visit (from the past 24 hours).    Social History   Tobacco Use  Smoking Status Former   Current packs/day: 0.00   Types: Cigarettes   Quit date: 08/16/2017   Years since quitting: 6.4  Smokeless Tobacco Never  Tobacco Comments   quit 07/12/17, abstaining 8/24    Goals Met:  Proper associated with RPD/PD & O2 Sat Exercise tolerated well No report of concerns or symptoms today Strength training completed today  Goals Unmet:  Not Applicable  Comments: Service time is from 0807 to 0912.    Dr. Mechele Collin is Medical Director for Pulmonary Rehab at [facility].

## 2024-02-10 ENCOUNTER — Encounter (HOSPITAL_COMMUNITY)
Admission: RE | Admit: 2024-02-10 | Discharge: 2024-02-10 | Disposition: A | Discharge status: Home / Self Care | Payer: Medicaid Other | Source: Ambulatory Visit | Attending: Pulmonary Disease

## 2024-02-10 DIAGNOSIS — J449 Chronic obstructive pulmonary disease, unspecified: Secondary | ICD-10-CM | POA: Diagnosis not present

## 2024-02-10 NOTE — Progress Notes (Signed)
 Daily Session Note  Patient Details  Name: Caitlin [name] MRN: [id number] Date of Birth: 1966-10-21 Referring Provider:   Doristine Devoid Pulmonary Rehab Walk Test from 11/19/2023 in [facility]  Referring Provider Everardo All       Encounter Date: 02/10/2024  Check In:  Session Check In - 02/10/24 5284       Check-In   Supervising physician immediately available to respond to emergencies [facility] MD immediately available    Physician(s) Jari Favre, PA    Location [facility]    Staff Present Durel Salts, Zella Richer, MS, ACSM-CEP, Exercise Physiologist;Randi Dionisio Paschal, ACSM-CEP, Exercise Physiologist;Trishelle Devora Gerre Scull, RN, BSN;Samantha Belarus, RD, LDN    Virtual Visit No    Medication changes reported     No    Tobacco Cessation No Change    Warm-up and Cool-down Performed as group-led instruction    Resistance Training Performed Yes    VAD Patient? No    PAD/SET Patient? No      Pain Assessment   Currently in Pain? No/denies             Capillary Blood Glucose: No results found for this or any previous visit (from the past 24 hours).    Social History   Tobacco Use  Smoking Status Former   Current packs/day: 0.00   Types: Cigarettes   Quit date: 08/16/2017   Years since quitting: 6.4  Smokeless Tobacco Never  Tobacco Comments   quit 07/12/17, abstaining 8/24    Goals Met:  Independence with exercise equipment Exercise tolerated well No report of concerns or symptoms today Strength training completed today  Goals Unmet:  Not Applicable  Comments: Service time is from 0812 to 0931    Dr. Mechele Collin is Medical Director for Pulmonary Rehab at [facility].

## 2024-02-10 NOTE — Progress Notes (Signed)
 Daily Session Note  Patient Details  Name: Caitlin [name] MRN: [id number] Date of Birth: [birth year] Referring Provider:   Doristine Devoid Pulmonary Rehab Walk Test from 11/19/2023 in [facility]  Referring Provider Everardo All       Encounter Date: 02/10/2024  Check In:  Session Check In - 02/10/24 4098       Check-In   Supervising physician immediately available to respond to emergencies [facility] MD immediately available    Physician(s) Jari Favre, PA    Location [facility]    Staff Present Durel Salts, Zella Richer, MS, ACSM-CEP, Exercise Physiologist;Randi Dionisio Paschal, ACSM-CEP, Exercise Physiologist;Chesney Suares Gerre Scull, RN, BSN;Samantha Belarus, RD, LDN    Virtual Visit No    Medication changes reported     No    Tobacco Cessation No Change    Warm-up and Cool-down Performed as group-led instruction    Resistance Training Performed Yes    VAD Patient? No    PAD/SET Patient? No      Pain Assessment   Currently in Pain? No/denies             Capillary Blood Glucose: No results found for this or any previous visit (from the past 24 hours).    Social History   Tobacco Use  Smoking Status Former   Current packs/day: 0.00   Types: Cigarettes   Quit date: 08/16/2017   Years since quitting: 6.4  Smokeless Tobacco Never  Tobacco Comments   quit 07/12/17, abstaining 8/24    Goals Met:  Independence with exercise equipment Exercise tolerated well No report of concerns or symptoms today Strength training completed today  Goals Unmet:  Not Applicable  Comments: Service time is from 0811 to 0931    Dr. Mechele Collin is Medical Director for Pulmonary Rehab at [facility].

## 2024-02-15 ENCOUNTER — Encounter (HOSPITAL_COMMUNITY)
Admission: RE | Admit: 2024-02-15 | Discharge: 2024-02-15 | Disposition: A | Discharge status: Home / Self Care | Source: Ambulatory Visit | Attending: Pulmonary Disease | Admitting: Pulmonary Disease

## 2024-02-15 VITALS — Wt 218.9 lb

## 2024-02-15 DIAGNOSIS — J449 Chronic obstructive pulmonary disease, unspecified: Secondary | ICD-10-CM | POA: Diagnosis not present

## 2024-02-15 NOTE — Progress Notes (Signed)
 Daily Session Note  Patient Details  Name: Caitlin [name] MRN: [id number] Date of Birth: 1965/11/21 Referring Provider:   Doristine Devoid Pulmonary Rehab Walk Test from 11/19/2023 in [facility]  Referring Provider Everardo All       Encounter Date: 02/15/2024  Check In:  Session Check In - 02/15/24 8295       Check-In   Supervising physician immediately available to respond to emergencies [facility] MD immediately available    Physician(s) Jari Favre, PA    Location [facility]    Staff Present Durel Salts, Zella Richer, MS, ACSM-CEP, Exercise Physiologist;Randi Dionisio Paschal, ACSM-CEP, Exercise Physiologist;Billey Wojciak Gerre Scull, RN, BSN    Virtual Visit No    Medication changes reported     No    Fall or balance concerns reported    No    Tobacco Cessation No Change    Warm-up and Cool-down Performed as group-led instruction    Resistance Training Performed Yes    VAD Patient? No    PAD/SET Patient? No      Pain Assessment   Currently in Pain? No/denies    Multiple Pain Sites No             Capillary Blood Glucose: No results found for this or any previous visit (from the past 24 hours).   Exercise Prescription Changes - 02/15/24 0900       Response to Exercise   Blood Pressure (Admit) 114/68    Blood Pressure (Exercise) 134/66    Blood Pressure (Exit) 110/66    Heart Rate (Admit) 75 bpm    Heart Rate (Exercise) 110 bpm    Heart Rate (Exit) 77 bpm    Oxygen Saturation (Admit) 98 %    Oxygen Saturation (Exercise) 93 %    Oxygen Saturation (Exit) 97 %    Rating of Perceived Exertion (Exercise) 10    Perceived Dyspnea (Exercise) 1    Duration Continue with 30 min of aerobic exercise without signs/symptoms of physical distress.    Intensity THRR unchanged      Progression   Progression Continue to progress workloads to maintain intensity without signs/symptoms of physical distress.      Resistance Training    Training Prescription Yes    Weight black bands    Reps 10-15    Time 10 Minutes      Treadmill   MPH 3.3    Grade 2.5    Minutes 15    METs 4.4      Elliptical   Level 3    Speed 2    Minutes 15    METs 4.9             Social History   Tobacco Use  Smoking Status Former   Current packs/day: 0.00   Types: Cigarettes   Quit date: 08/16/2017   Years since quitting: 6.5  Smokeless Tobacco Never  Tobacco Comments   quit 07/12/17, abstaining 8/24    Goals Met:  Independence with exercise equipment Exercise tolerated well No report of concerns or symptoms today Strength training completed today  Goals Unmet:  Not Applicable  Comments: Service time is from 0805 to 0929    Dr. Mechele Collin is Medical Director for Pulmonary Rehab at [facility].

## 2024-02-16 NOTE — Progress Notes (Signed)
 Discharge Progress Report  Patient Details  Name: Caitlin [name] MRN: [id number] Date of Birth: [birth year] Referring Provider:   Doristine Devoid Pulmonary Rehab Walk Test from 11/19/2023 in [facility]  Referring Provider Everardo All        Number of Visits: 56  Reason for Discharge:  Patient has met program and personal goals.  Smoking History:  Social History   Tobacco Use  Smoking Status Former   Current packs/day: 0.00   Types: Cigarettes   Quit date: 08/16/2017   Years since quitting: 6.5  Smokeless Tobacco Never  Tobacco Comments   quit 07/12/17, abstaining 8/24    Diagnosis:  Stage 2 moderate COPD by GOLD classification (HCC)  ADL UCSD:  Pulmonary Assessment Scores     Row Name 11/19/23 1354 02/08/24 0824       ADL UCSD   ADL Phase Entry Exit    SOB Score total 36 48      CAT Score   CAT Score 12 16      mMRC Score   mMRC Score 2 2             Initial Exercise Prescription:  Initial Exercise Prescription - 11/19/23 1400       Date of Initial Exercise RX and Referring Provider   Date 11/19/23    Referring Provider Everardo All    Expected Discharge Date 02/10/24      Treadmill   MPH 2.5    Grade 1    Minutes 15    METs 3      Elliptical   Level 1    Speed 1    Minutes 15    METs 3      Prescription Details   Frequency (times per week) 2    Duration Progress to 30 minutes of continuous aerobic without signs/symptoms of physical distress      Intensity   THRR 40-80% of Max Heartrate 64-129    Ratings of Perceived Exertion 11-13    Perceived Dyspnea 0-4      Progression   Progression Continue to progress workloads to maintain intensity without signs/symptoms of physical distress.      Resistance Training   Training Prescription Yes    Weight black bands    Reps 10-15             Discharge Exercise Prescription (Final Exercise Prescription Changes):  Exercise Prescription  Changes - 02/15/24 0900       Response to Exercise   Blood Pressure (Admit) 114/68    Blood Pressure (Exercise) 134/66    Blood Pressure (Exit) 110/66    Heart Rate (Admit) 75 bpm    Heart Rate (Exercise) 110 bpm    Heart Rate (Exit) 77 bpm    Oxygen Saturation (Admit) 98 %    Oxygen Saturation (Exercise) 93 %    Oxygen Saturation (Exit) 97 %    Rating of Perceived Exertion (Exercise) 10    Perceived Dyspnea (Exercise) 1    Duration Continue with 30 min of aerobic exercise without signs/symptoms of physical distress.    Intensity THRR unchanged      Progression   Progression Continue to progress workloads to maintain intensity without signs/symptoms of physical distress.      Resistance Training   Training Prescription Yes    Weight black bands    Reps 10-15    Time 10 Minutes      Treadmill   MPH 3.3  Grade 2.5    Minutes 15    METs 4.4      Elliptical   Level 3    Speed 2    Minutes 15    METs 4.9             Functional Capacity:  6 Minute Walk     Row Name 11/19/23 1421 02/10/24 0900       6 Minute Walk   Phase Initial Discharge    Distance 1442 feet 1705 feet    Distance % Change -- 18.24 %    Distance Feet Change -- 236 ft    Walk Time 6 minutes 6 minutes    # of Rest Breaks 0 0    MPH 2.73 3.23    METS 3.02 4.57    RPE 11 13    Perceived Dyspnea  1 1    VO2 Peak 10.56 16    Symptoms No No    Resting HR 92 bpm 701 bpm    Resting BP 132/70 110/66    Resting Oxygen Saturation  99 % 97 %    Exercise Oxygen Saturation  during 6 min walk 94 % 92 %    Max Ex. HR 100 bpm 158 bpm    Max Ex. BP 144/80 132/70    2 Minute Post BP 120/70 112/68      Interval HR   1 Minute HR 92 79    2 Minute HR 98 92    3 Minute HR 98 109    4 Minute HR 99 122    5 Minute HR 100 133    6 Minute HR 96 158    2 Minute Post HR 84 87    Interval Heart Rate? Yes Yes      Interval Oxygen   Interval Oxygen? Yes Yes    Baseline Oxygen Saturation % 99 % 97 %     1 Minute Oxygen Saturation % 99 % 94 %    1 Minute Liters of Oxygen 0 L 0 L    2 Minute Oxygen Saturation % 96 % 97 %    2 Minute Liters of Oxygen 0 L 0 L    3 Minute Oxygen Saturation % 96 % 97 %    3 Minute Liters of Oxygen 0 L 0 L    4 Minute Oxygen Saturation % 95 % 95 %    4 Minute Liters of Oxygen 0 L 0 L    5 Minute Oxygen Saturation % 94 % 95 %    5 Minute Liters of Oxygen 0 L 0 L    6 Minute Oxygen Saturation % 96 % 92 %    6 Minute Liters of Oxygen 0 L 0 L    2 Minute Post Oxygen Saturation % 99 % 99 %    2 Minute Post Liters of Oxygen 0 L 0 L             Psychological, QOL, Others - Outcomes: PHQ 2/9:    02/08/2024    8:23 AM 11/19/2023    1:26 PM 06/18/2023   10:43 AM 07/23/2022    2:10 PM 03/24/2022    8:57 AM  Depression screen PHQ 2/9  Decreased Interest 0 0 1 0 0  Down, Depressed, Hopeless 0 0 0 0 0  PHQ - 2 Score 0 0 1 0 0  Altered sleeping 0 0 1 0 0  Tired, decreased energy 0 2 1 2  0  Change in  appetite 0 0 0 0 0  Feeling bad or failure about yourself  0 0 0 0 0  Trouble concentrating 0 0 0 0 0  Moving slowly or fidgety/restless 0 0 0 0 0  Suicidal thoughts 0 0 0 0 0  PHQ-9 Score 0 2 3 2  0  Difficult doing work/chores Not difficult at all Not difficult at all Somewhat difficult Not difficult at all Not difficult at all    Quality of Life:   Personal Goals: Goals established at orientation with interventions provided to work toward goal.  Personal Goals and Risk Factors at Admission - 11/19/23 1339       Core Components/Risk Factors/Patient Goals on Admission   Improve shortness of breath with ADL's Yes    Intervention Provide education, individualized exercise plan and daily activity instruction to help decrease symptoms of SOB with activities of daily living.    Expected Outcomes Short Term: Improve cardiorespiratory fitness to achieve a reduction of symptoms when performing ADLs;Long Term: Be able to perform more ADLs without symptoms or delay the  onset of symptoms    Increase knowledge of respiratory medications and ability to use respiratory devices properly  Yes    Intervention Provide education and demonstration as needed of appropriate use of medications, inhalers, and oxygen therapy.    Expected Outcomes Short Term: Achieves understanding of medications use. Understands that oxygen is a medication prescribed by physician. Demonstrates appropriate use of inhaler and oxygen therapy.;Long Term: Maintain appropriate use of medications, inhalers, and oxygen therapy.              Personal Goals Discharge:  Goals and Risk Factor Review     Row Name 12/08/23 1050 12/29/23 1440 01/24/24 1348 02/16/24 0916       Core Components/Risk Factors/Patient Goals Review   Personal Goals Review Improve shortness of breath with ADL's;Develop more efficient breathing techniques such as purse lipped breathing and diaphragmatic breathing and practicing self-pacing with activity.;Increase knowledge of respiratory medications and ability to use respiratory devices properly. Improve shortness of breath with ADL's;Develop more efficient breathing techniques such as purse lipped breathing and diaphragmatic breathing and practicing self-pacing with activity. Improve shortness of breath with ADL's Improve shortness of breath with ADL's    Review Monthly review of patient's Core Components/Risk Factors/Patient Goals are as follows: Goal in progress for improving her shortness of breath with ADLs and developing more efficient breathing techniques such as purse lipped breathing and diaphragmatic breathing; and practicing self-pacing with activity. Manuel has been able to increase her workload and METS while maintaining her oxygen saturation on room air. This is her 3rd time attending the program and she is familiar with the routine, how to stop and purse lip breathe and how to pace herself. She is building up her endurance and stamina to complete activities of daily  living at home with decreased shortness of breath. Goal met for increasing her knowledge of respiratory medications and the ability to use respiratory devices properly. Our respiratory therapist has reviewed medications with Celest, Reitz has demonstrated how to use the inhalers, when to use them and has verbalized side effects correctly. Tonni stated she is confidant in using her medications and now understands what they are used for. Shelma will continue to benefit from participation in [place] for nutrition, education, exercise, and lifestyle modification. Monthly review of patient's Core Components/Risk Factors/Patient Goals are as follows: Goal in progress for improving her shortness of breath with ADLs. Ladashia has been able to increase her  workload and METS while maintaining her oxygen saturation on room air. She can report her dyspnea and rate of perceived exertion scores to staff. Goal met on developing more efficient breathing techniques such as purse lipped breathing and diaphragmatic breathing; and practicing self-pacing with activity. Kinleigh is familiar with how to stop and purse lip breathe and how to pace herself with exertion. She correctly performs diaphragmatic breathing techniques. She is building up her endurance and stamina to complete activities of daily living at home with decreased shortness of breath. Ashante will continue to benefit from participation in [place] for nutrition, education, exercise, and lifestyle modification. Goal in progress for improving her shortness of breath with ADLs. Cynara is currently exercising on the Elliptical and the treadmill. She has been able to increase her workload and METS while maintaining her oxygen saturation on room air. She can report her dyspnea and rate of perceived exertion scores to staff.  Fallen will continue to benefit from participation in [place] for nutrition, education, exercise, and lifestyle modification. Eliz graduated from the [employer] on 02/15/24.  She met her goal for improving SOB with ADL's. Although her SOB score increased from 36 to 48, Shanyn stated to staff her SOB has decreased. Taquisha did great in the program and we wish her the best.    Expected Outcomes For Linzi to improve her shortness of breath with ADLs, develop more efficient breathing techniques such as purse lipped breathing and diaphragmatic breathing; and practicing self-pacing with activity and improve shortness of breath with ADL's For Shaunita to improve her shortness of breath with ADLs, develop more efficient breathing techniques such as purse lipped breathing and diaphragmatic breathing; and practicing self-pacing with activity and improve shortness of breath with ADL's For Amrie to improve her shortness of breath with ADLs To continue to exercise and modify her nutrition and lifestyle post graduation             Exercise Goals and Review:  Exercise Goals     Row Name 11/19/23 1340 12/27/23 0915           Exercise Goals   Increase Physical Activity Yes Yes      Intervention Provide advice, education, support and counseling about physical activity/exercise needs.;Develop an individualized exercise prescription for aerobic and resistive training based on initial evaluation findings, risk stratification, comorbidities and participant's personal goals. Provide advice, education, support and counseling about physical activity/exercise needs.;Develop an individualized exercise prescription for aerobic and resistive training based on initial evaluation findings, risk stratification, comorbidities and participant's personal goals.      Expected Outcomes Short Term: Attend rehab on a regular basis to increase amount of physical activity.;Long Term: Add in home exercise to make exercise part of routine and to increase amount of physical activity.;Long Term: Exercising regularly at least 3-5 days a week. Short Term: Attend rehab on a regular basis to increase amount of  physical activity.;Long Term: Add in home exercise to make exercise part of routine and to increase amount of physical activity.;Long Term: Exercising regularly at least 3-5 days a week.      Increase Strength and Stamina Yes Yes      Intervention Provide advice, education, support and counseling about physical activity/exercise needs.;Develop an individualized exercise prescription for aerobic and resistive training based on initial evaluation findings, risk stratification, comorbidities and participant's personal goals. Provide advice, education, support and counseling about physical activity/exercise needs.;Develop an individualized exercise prescription for aerobic and resistive training based on initial evaluation findings, risk stratification, comorbidities  and participant's personal goals.      Expected Outcomes Short Term: Increase workloads from initial exercise prescription for resistance, speed, and METs.;Short Term: Perform resistance training exercises routinely during rehab and add in resistance training at home;Long Term: Improve cardiorespiratory fitness, muscular endurance and strength as measured by increased METs and functional capacity ( ) Short Term: Increase workloads from initial exercise prescription for resistance, speed, and METs.;Short Term: Perform resistance training exercises routinely during rehab and add in resistance training at home;Long Term: Improve cardiorespiratory fitness, muscular endurance and strength as measured by increased METs and functional capacity ( )      Able to understand and use rate of perceived exertion (RPE) scale Yes Yes      Intervention Provide education and explanation on how to use RPE scale Provide education and explanation on how to use RPE scale      Expected Outcomes Short Term: Able to use RPE daily in rehab to express subjective intensity level;Long Term:  Able to use RPE to guide intensity level when exercising independently Short Term:  Able to use RPE daily in rehab to express subjective intensity level;Long Term:  Able to use RPE to guide intensity level when exercising independently      Able to understand and use Dyspnea scale Yes Yes      Intervention Provide education and explanation on how to use Dyspnea scale Provide education and explanation on how to use Dyspnea scale      Expected Outcomes Short Term: Able to use Dyspnea scale daily in rehab to express subjective sense of shortness of breath during exertion;Long Term: Able to use Dyspnea scale to guide intensity level when exercising independently Short Term: Able to use Dyspnea scale daily in rehab to express subjective sense of shortness of breath during exertion;Long Term: Able to use Dyspnea scale to guide intensity level when exercising independently      Knowledge and understanding of Target Heart Rate Range (THRR) Yes Yes      Intervention Provide education and explanation of THRR including how the numbers were predicted and where they are located for reference Provide education and explanation of THRR including how the numbers were predicted and where they are located for reference      Expected Outcomes Short Term: Able to state/look up THRR;Long Term: Able to use THRR to govern intensity when exercising independently;Short Term: Able to use daily as guideline for intensity in rehab Short Term: Able to state/look up THRR;Long Term: Able to use THRR to govern intensity when exercising independently;Short Term: Able to use daily as guideline for intensity in rehab      Understanding of Exercise Prescription Yes Yes      Intervention Provide education, explanation, and written materials on patient's individual exercise prescription Provide education, explanation, and written materials on patient's individual exercise prescription      Expected Outcomes Short Term: Able to explain program exercise prescription;Long Term: Able to explain home exercise prescription to  exercise independently Short Term: Able to explain program exercise prescription;Long Term: Able to explain home exercise prescription to exercise independently               Exercise Goals Re-Evaluation:  Exercise Goals Re-Evaluation     Row Name 11/29/23 0849 12/27/23 0910 01/26/24 0943 02/16/24 0902       Exercise Goal Re-Evaluation   Exercise Goals Review Increase Physical Activity;Able to understand and use Dyspnea scale;Understanding of Exercise Prescription;Increase Strength and Stamina;Knowledge and understanding of Target Heart Rate Range (THRR);Able  to understand and use rate of perceived exertion (RPE) scale Increase Physical Activity;Able to understand and use Dyspnea scale;Understanding of Exercise Prescription;Increase Strength and Stamina;Knowledge and understanding of Target Heart Rate Range (THRR);Able to understand and use rate of perceived exertion (RPE) scale Increase Physical Activity;Able to understand and use Dyspnea scale;Understanding of Exercise Prescription;Increase Strength and Stamina;Knowledge and understanding of Target Heart Rate Range (THRR);Able to understand and use rate of perceived exertion (RPE) scale Increase Physical Activity;Able to understand and use Dyspnea scale;Understanding of Exercise Prescription;Increase Strength and Stamina;Knowledge and understanding of Target Heart Rate Range (THRR);Able to understand and use rate of perceived exertion (RPE) scale    Comments Zauria has completed 2 exercise sessions. She exercises for 15 min on the upright elliptical and treadmill. She peforms the warmup and cooldown standing without limitations. It is too soon to notate any discernable progressions. Will continue to monitor and progress as able. Adianna has completed 9 exercise sessions. She exercises for 15 min on the upright elliptical and treadmill. Myria averages 4.6 METs at level 2 and incline of 1 on the upright elliptical and 4.2 METs at 3.1 mph and 2%  incline on the treadmill. She peforms the warmup and cooldown standing without limitations. Kiala has increased her workload for both exercise modes as she tolerates progressions well. We have discussed home exercise as she is exercising outside of rehab. Will continue to monitor and progress as able. Sayler has completed 14 exercise sessions. She exercises for 15 min on the upright elliptical and treadmill. Shatiqua averages 4.2 METs at level 2 and incline of 2 on the upright elliptical and 4.5 METs at 3.3 mph and 2.5% incline on the treadmill. She peforms the warmup and cooldown standing without limitations. Eunique has increased her incline on the upright elliptical and speed and incline on the treadmill. METs have remained the same on the upright elliptical and increased on the treadmill. Larhonda has missed a few sessions due to personal commitments. Will continue to monitor and progess as able. Markel has completed 19 exercise sessions. Peak METs were 5.0 on the upright elliptical and 4.4 on the treadmill. Pt plans to continue exercise with her sister.    Expected Outcomes Through exercise at rehab and home, the patient will decrease shortness of breath with daily activities and feel confident in carrying out an exercise regimen at home. Through exercise at rehab and home, the patient will decrease shortness of breath with daily activities and feel confident in carrying out an exercise regimen at home. Through exercise at rehab and home, the patient will decrease shortness of breath with daily activities and feel confident in carrying out an exercise regimen at home. Through exercise at rehab and home, the patient will decrease shortness of breath with daily activities and feel confident in carrying out an exercise regimen at home.             Nutrition & Weight - Outcomes:  Pre Biometrics - 11/19/23 1415       Pre Biometrics   Grip Strength 40 kg              Nutrition:  Nutrition Therapy  & Goals - 01/24/24 0931       Nutrition Therapy   Diet Heart healthy Diet    Drug/Food Interactions Statins/Certain Fruits      Personal Nutrition Goals   Nutrition Goal Patient to improve diet quality by using the plate method as a guide for meal planning to include lean protein/plant protein, fruits,  vegetables, whole grains, nonfat dairy as part of a well-balanced diet.   goal in progress.   Personal Goal #2 Patient to identify strategies for weight loss of 0.5-2.0# per week.   goal in progress.   Comments Goals in progress. Anora has medical history of Takotsubo cardiomyopathy, hyperlipidemia, preDM, COPD1.  She reports motivation to lose weight to improve pre-diabetes. She is down 2.2# since starting with our program; will continue to follow-up to address strategies to aid with weight loss/blood sugar management. She works part time as a Lawyer. Patient will continue to benefit from participation in pulmonary rehab for nutrition, exercise, and lifestyle modification.      Intervention Plan   Intervention Prescribe, educate and counsel regarding individualized specific dietary modifications aiming towards targeted core components such as weight, hypertension, lipid management, diabetes, heart failure and other comorbidities.;Nutrition handout(s) given to patient.    Expected Outcomes Short Term Goal: Understand basic principles of dietary content, such as calories, fat, sodium, cholesterol and nutrients.;Long Term Goal: Adherence to prescribed nutrition plan.             Nutrition Discharge:   Education Questionnaire Score:  Knowledge Questionnaire Score - 02/08/24 0824       Knowledge Questionnaire Score   Post Score 14/18             Goals reviewed with patient; copy given to patient.

## 2024-02-20 ENCOUNTER — Other Ambulatory Visit: Payer: Self-pay | Admitting: Family Medicine

## 2024-02-20 DIAGNOSIS — E782 Mixed hyperlipidemia: Secondary | ICD-10-CM

## 2024-03-10 ENCOUNTER — Other Ambulatory Visit: Payer: Self-pay | Admitting: Family Medicine

## 2024-03-24 ENCOUNTER — Other Ambulatory Visit: Payer: Self-pay | Admitting: Family Medicine

## 2024-04-11 ENCOUNTER — Ambulatory Visit (INDEPENDENT_AMBULATORY_CARE_PROVIDER_SITE_OTHER): Payer: Medicaid Other | Admitting: Family Medicine

## 2024-04-11 ENCOUNTER — Encounter: Payer: Self-pay | Admitting: Family Medicine

## 2024-04-11 VITALS — BP 145/85 | HR 61

## 2024-04-11 DIAGNOSIS — Z13228 Encounter for screening for other metabolic disorders: Secondary | ICD-10-CM | POA: Diagnosis not present

## 2024-04-11 DIAGNOSIS — Z0000 Encounter for general adult medical examination without abnormal findings: Secondary | ICD-10-CM

## 2024-04-11 DIAGNOSIS — Z7689 Persons encountering health services in other specified circumstances: Secondary | ICD-10-CM

## 2024-04-11 DIAGNOSIS — Z130 Encounter for screening for diseases of the blood and blood-forming organs and certain disorders involving the immune mechanism: Secondary | ICD-10-CM | POA: Diagnosis not present

## 2024-04-11 DIAGNOSIS — Z13220 Encounter for screening for lipoid disorders: Secondary | ICD-10-CM | POA: Diagnosis not present

## 2024-04-11 DIAGNOSIS — Z1329 Encounter for screening for other suspected endocrine disorder: Secondary | ICD-10-CM

## 2024-04-11 NOTE — Progress Notes (Unsigned)
 Established Patient Office Visit  Subjective    Patient ID: Caitlin [name], female    DOB: 1966/03/08  Age: 57 y.o. MRN: [id number]  CC:  Chief Complaint  Patient presents with   Annual Exam    Heart burn   paperwork     HPI Caitlin [name] presents ***  Outpatient Encounter Medications as of 04/11/2024  Medication Sig   albuterol  (VENTOLIN  HFA) 108 (90 Base) MCG/ACT inhaler Inhale 2 puffs into the lungs every 4 (four) hours as needed for wheezing or shortness of breath.   atorvastatin  (LIPITOR) 10 MG tablet TAKE 1 TABLET BY MOUTH EVERY DAY   METAMUCIL FIBER PO Take 3 tablets by mouth daily.   Omega-3 Fatty Acids (FISH OIL PO) Take 1 capsule by mouth daily.   umeclidinium-vilanterol (ANORO ELLIPTA ) 62.5-25 MCG/ACT AEPB Inhale 1 puff into the lungs daily at 6 (six) AM.   Vitamin D , Ergocalciferol , (DRISDOL ) 1.25 MG (50000 UNIT) CAPS capsule Take 1 capsule (50,000 Units total) by mouth every 7 (seven) days. (Patient not taking: Reported on 04/11/2024)   No facility-administered encounter medications on file as of 04/11/2024.    Past Medical History:  Diagnosis Date   CHF (congestive heart failure) (HCC)    COPD (chronic obstructive pulmonary disease) (HCC)    HLD (hyperlipidemia)    tx w/atorvastatin    NSTEMI (non-ST elevated myocardial infarction) (HCC)    pt denies   Takotsubo cardiomyopathy     Past Surgical History:  Procedure Laterality Date   BRONCHIAL BIOPSY  01/08/2022   Procedure: BRONCHIAL BIOPSIES;  Surgeon: Prudy Brownie, DO;  [facility];  Service: Pulmonary;;   BRONCHIAL BRUSHINGS  01/08/2022   Procedure: BRONCHIAL BRUSHINGS;  Surgeon: Prudy Brownie, DO;  [facility];  Service: Pulmonary;;   BRONCHIAL NEEDLE ASPIRATION BIOPSY  01/08/2022   Procedure: BRONCHIAL NEEDLE ASPIRATION BIOPSIES;  Surgeon: Prudy Brownie, DO;  [facility];  Service: Pulmonary;;   FIDUCIAL MARKER PLACEMENT  01/08/2022   Procedure: FIDUCIAL DYE MARKING;   Surgeon: Prudy Brownie, DO;  [facility];  Service: Pulmonary;;   INTERCOSTAL NERVE BLOCK Left 01/08/2022   Procedure: INTERCOSTAL NERVE BLOCK;  Surgeon: Hilarie Lovely, MD;  [facility];  Service: Thoracic;  Laterality: Left;   LEFT HEART CATH AND CORONARY ANGIOGRAPHY N/A 07/15/2017   Procedure: LEFT HEART CATH AND CORONARY ANGIOGRAPHY;  Surgeon: Swaziland, Peter M, MD;  [facility];  Service: Cardiovascular;  Laterality: N/A;   NODE DISSECTION Left 01/08/2022   Procedure: NODE DISSECTION;  Surgeon: Hilarie Lovely, MD;  [facility];  Service: Thoracic;  Laterality: Left;   TRACHEOSTOMY CLOSURE  2018   VIDEO BRONCHOSCOPY WITH RADIAL ENDOBRONCHIAL ULTRASOUND  01/08/2022   Procedure: VIDEO BRONCHOSCOPY WITH RADIAL ENDOBRONCHIAL ULTRASOUND;  Surgeon: Prudy Brownie, DO;  [facility];  Service: Pulmonary;;   WISDOM TOOTH EXTRACTION      Family History  Problem Relation Age of Onset   Heart disease Mother        age 64's   Hypertension Mother    Pancreatic cancer Mother    Aneurysm Father        brain   Colon cancer Neg Hx    Colon polyps Neg Hx    Esophageal cancer Neg Hx    Rectal cancer Neg Hx    Stomach cancer Neg Hx     Social History   Socioeconomic History   Marital status: Divorced    Spouse name: Not  on file   Number of children: 2   Years of education: Not on file   Highest education level: 10th grade  Occupational History   Occupation: CNA  Tobacco Use   Smoking status: Former    Current packs/day: 0.00    Types: Cigarettes    Quit date: 08/16/2017    Years since quitting: 6.6   Smokeless tobacco: Never   Tobacco comments:    quit 07/12/17, abstaining 8/24  Vaping Use   Vaping status: Never Used  Substance and Sexual Activity   Alcohol use: Yes    Comment: occasionally   Drug use: Not Currently    Comment: Occasional marijuana quit 6 months ago    Sexual activity: Yes    Birth control/protection: None   Other Topics Concern   Not on file  Social History Narrative   Lives alone   Social Drivers of Health   Financial Resource Strain: Low Risk  (04/07/2024)   Overall Financial Resource Strain (CARDIA)    Difficulty of Paying Living Expenses: Not very hard  Food Insecurity: Patient Declined (04/07/2024)   Hunger Vital Sign    Worried About Running Out of Food in the Last Year: Patient declined    Ran Out of Food in the Last Year: Patient declined  Transportation Needs: No Transportation Needs (04/07/2024)   PRAPARE - Administrator, Civil Service (Medical): No    Lack of Transportation (Non-Medical): No  Physical Activity: Unknown (04/07/2024)   Exercise Vital Sign    Days of Exercise per Week: Patient declined    Minutes of Exercise per Session: Not on file  Stress: No Stress Concern Present (04/07/2024)   [employer] of Occupational Health - Occupational Stress Questionnaire    Feeling of Stress : Not at all  Social Connections: Moderately Isolated (04/07/2024)   Social Connection and Isolation Panel [NHANES]    Frequency of Communication with Friends and Family: More than three times a week    Frequency of Social Gatherings with Friends and Family: Twice a week    Attends Religious Services: More than 4 times per year    Active Member of [employer] or Organizations: No    Attends Engineer, structural: Not on file    Marital Status: Divorced  Intimate Partner Violence: Not on file    ROS      Objective    BP (!) 145/85 (BP Location: Right Arm, Cuff Size: Normal)   Pulse 61   LMP 09/09/2014 (Approximate)   SpO2 94%   Physical Exam  {Labs (Optional):23779}    Assessment & Plan:   There are no diagnoses linked to this encounter.   No follow-ups on file.   Arlo Lama, MD

## 2024-04-12 ENCOUNTER — Ambulatory Visit: Payer: Self-pay

## 2024-04-12 ENCOUNTER — Encounter: Payer: Self-pay | Admitting: Family Medicine

## 2024-04-12 LAB — LIPID PANEL
Chol/HDL Ratio: 3.3 ratio (ref 0.0–4.4)
Cholesterol, Total: 160 mg/dL (ref 100–199)
HDL: 49 mg/dL (ref >39)
LDL Chol Calc (NIH): 97 mg/dL (ref 0–99)
Triglycerides: 71 mg/dL (ref 0–149)
VLDL Cholesterol Cal: 14 mg/dL (ref 5–40)

## 2024-04-12 LAB — CBC WITH DIFFERENTIAL/PLATELET
Basophils Absolute: 0.1 10*3/uL (ref 0.0–0.2)
Basos: 1 %
EOS (ABSOLUTE): 0.3 10*3/uL (ref 0.0–0.4)
Eos: 4 %
Hematocrit: 44.3 % (ref 34.0–46.6)
Hemoglobin: 13.9 g/dL (ref 11.1–15.9)
Immature Grans (Abs): 0 10*3/uL (ref 0.0–0.1)
Immature Granulocytes: 0 %
Lymphocytes Absolute: 3.1 10*3/uL (ref 0.7–3.1)
Lymphs: 53 %
MCH: 27.5 pg (ref 26.6–33.0)
MCHC: 31.4 g/dL — ABNORMAL LOW (ref 31.5–35.7)
MCV: 88 fL (ref 79–97)
Monocytes Absolute: 0.5 10*3/uL (ref 0.1–0.9)
Monocytes: 9 %
Neutrophils Absolute: 1.9 10*3/uL (ref 1.4–7.0)
Neutrophils: 33 %
Platelets: 330 10*3/uL (ref 150–450)
RBC: 5.05 x10E6/uL (ref 3.77–5.28)
RDW: 13.7 % (ref 11.7–15.4)
WBC: 5.8 10*3/uL (ref 3.4–10.8)

## 2024-04-12 LAB — HEMOGLOBIN A1C
Est. average glucose Bld gHb Est-mCnc: 126 mg/dL
Hgb A1c MFr Bld: 6 % — ABNORMAL HIGH (ref 4.8–5.6)

## 2024-04-12 LAB — CMP14+EGFR
ALT: 22 IU/L (ref 0–32)
AST: 23 IU/L (ref 0–40)
Albumin: 4.4 g/dL (ref 3.8–4.9)
Alkaline Phosphatase: 62 IU/L (ref 44–121)
BUN/Creatinine Ratio: 10 — ABNORMAL LOW (ref 12–28)
BUN: 7 mg/dL — ABNORMAL LOW (ref 8–27)
Bilirubin Total: 0.2 mg/dL (ref 0.0–1.2)
CO2: 22 mmol/L (ref 20–29)
Calcium: 9.9 mg/dL (ref 8.7–10.3)
Chloride: 104 mmol/L (ref 96–106)
Creatinine, Ser: 0.72 mg/dL (ref 0.57–1.00)
Globulin, Total: 2.6 g/dL (ref 1.5–4.5)
Glucose: 86 mg/dL (ref 70–99)
Potassium: 4.5 mmol/L (ref 3.5–5.2)
Sodium: 140 mmol/L (ref 134–144)
Total Protein: 7 g/dL (ref 6.0–8.5)
eGFR: 96 mL/min/{1.73_m2} (ref >59)

## 2024-04-12 LAB — VITAMIN D 25 HYDROXY (VIT D DEFICIENCY, FRACTURES): Vit D, 25-Hydroxy: 29.4 ng/mL — ABNORMAL LOW (ref 30.0–100.0)

## 2024-04-12 LAB — TSH: TSH: 2.91 u[IU]/mL (ref 0.450–4.500)

## 2024-04-12 NOTE — Progress Notes (Signed)
 New Patient Office Visit  Subjective    Patient ID: Caitlin [name], female    DOB: 10/13/1966  Age: 58 y.o. MRN: [id number]  CC:  Chief Complaint  Patient presents with   Annual Exam    Heart burn   paperwork     HPI Caitlin [name] presents to establish care and for routine annual exam. Patient denies acute complaints or concerns.    Outpatient Encounter Medications as of 04/11/2024  Medication Sig   albuterol  (VENTOLIN  HFA) 108 (90 Base) MCG/ACT inhaler Inhale 2 puffs into the lungs every 4 (four) hours as needed for wheezing or shortness of breath.   atorvastatin  (LIPITOR) 10 MG tablet TAKE 1 TABLET BY MOUTH EVERY DAY   METAMUCIL FIBER PO Take 3 tablets by mouth daily.   Omega-3 Fatty Acids (FISH OIL PO) Take 1 capsule by mouth daily.   umeclidinium-vilanterol (ANORO ELLIPTA ) 62.5-25 MCG/ACT AEPB Inhale 1 puff into the lungs daily at 6 (six) AM.   Vitamin D , Ergocalciferol , (DRISDOL ) 1.25 MG (50000 UNIT) CAPS capsule Take 1 capsule (50,000 Units total) by mouth every 7 (seven) days. (Patient not taking: Reported on 04/11/2024)   No facility-administered encounter medications on file as of 04/11/2024.    Past Medical History:  Diagnosis Date   CHF (congestive heart failure) (HCC)    COPD (chronic obstructive pulmonary disease) (HCC)    HLD (hyperlipidemia)    tx w/atorvastatin    NSTEMI (non-ST elevated myocardial infarction) (HCC)    pt denies   Takotsubo cardiomyopathy     Past Surgical History:  Procedure Laterality Date   BRONCHIAL BIOPSY  01/08/2022   Procedure: BRONCHIAL BIOPSIES;  Surgeon: Prudy Brownie, DO;  [facility];  Service: Pulmonary;;   BRONCHIAL BRUSHINGS  01/08/2022   Procedure: BRONCHIAL BRUSHINGS;  Surgeon: Prudy Brownie, DO;  [facility];  Service: Pulmonary;;   BRONCHIAL NEEDLE ASPIRATION BIOPSY  01/08/2022   Procedure: BRONCHIAL NEEDLE ASPIRATION BIOPSIES;  Surgeon: Prudy Brownie, DO;  [facility];  Service:  Pulmonary;;   FIDUCIAL MARKER PLACEMENT  01/08/2022   Procedure: FIDUCIAL DYE MARKING;  Surgeon: Prudy Brownie, DO;  [facility];  Service: Pulmonary;;   INTERCOSTAL NERVE BLOCK Left 01/08/2022   Procedure: INTERCOSTAL NERVE BLOCK;  Surgeon: Hilarie Lovely, MD;  [facility];  Service: Thoracic;  Laterality: Left;   LEFT HEART CATH AND CORONARY ANGIOGRAPHY N/A 07/15/2017   Procedure: LEFT HEART CATH AND CORONARY ANGIOGRAPHY;  Surgeon: Swaziland, Peter M, MD;  [facility];  Service: Cardiovascular;  Laterality: N/A;   NODE DISSECTION Left 01/08/2022   Procedure: NODE DISSECTION;  Surgeon: Hilarie Lovely, MD;  [facility];  Service: Thoracic;  Laterality: Left;   TRACHEOSTOMY CLOSURE  2018   VIDEO BRONCHOSCOPY WITH RADIAL ENDOBRONCHIAL ULTRASOUND  01/08/2022   Procedure: VIDEO BRONCHOSCOPY WITH RADIAL ENDOBRONCHIAL ULTRASOUND;  Surgeon: Prudy Brownie, DO;  [facility];  Service: Pulmonary;;   WISDOM TOOTH EXTRACTION      Family History  Problem Relation Age of Onset   Heart disease Mother        age 68's   Hypertension Mother    Pancreatic cancer Mother    Aneurysm Father        brain   Colon cancer Neg Hx    Colon polyps Neg Hx    Esophageal cancer Neg Hx    Rectal cancer Neg Hx    Stomach cancer Neg Hx     Social History  Socioeconomic History   Marital status: Divorced    Spouse name: Not on file   Number of children: 2   Years of education: Not on file   Highest education level: 10th grade  Occupational History   Occupation: CNA  Tobacco Use   Smoking status: Former    Current packs/day: 0.00    Types: Cigarettes    Quit date: 08/16/2017    Years since quitting: 6.6   Smokeless tobacco: Never   Tobacco comments:    quit 07/12/17, abstaining 8/24  Vaping Use   Vaping status: Never Used  Substance and Sexual Activity   Alcohol use: Yes    Comment: occasionally   Drug use: Not Currently    Comment: Occasional  marijuana quit 6 months ago    Sexual activity: Yes    Birth control/protection: None  Other Topics Concern   Not on file  Social History Narrative   Lives alone   Social Drivers of Health   Financial Resource Strain: Low Risk  (04/07/2024)   Overall Financial Resource Strain (CARDIA)    Difficulty of Paying Living Expenses: Not very hard  Food Insecurity: Patient Declined (04/07/2024)   Hunger Vital Sign    Worried About Running Out of Food in the Last Year: Patient declined    Ran Out of Food in the Last Year: Patient declined  Transportation Needs: No Transportation Needs (04/07/2024)   PRAPARE - Administrator, Civil Service (Medical): No    Lack of Transportation (Non-Medical): No  Physical Activity: Unknown (04/07/2024)   Exercise Vital Sign    Days of Exercise per Week: Patient declined    Minutes of Exercise per Session: Not on file  Stress: No Stress Concern Present (04/07/2024)   [employer] of Occupational Health - Occupational Stress Questionnaire    Feeling of Stress : Not at all  Social Connections: Moderately Isolated (04/07/2024)   Social Connection and Isolation Panel [NHANES]    Frequency of Communication with Friends and Family: More than three times a week    Frequency of Social Gatherings with Friends and Family: Twice a week    Attends Religious Services: More than 4 times per year    Active Member of [employer] or Organizations: No    Attends Engineer, structural: Not on file    Marital Status: Divorced  Intimate Partner Violence: Not on file    Review of Systems  All other systems reviewed and are negative.       Objective   BP (!) 145/85 (BP Location: Right Arm, Cuff Size: Normal)   Pulse 61   LMP 09/09/2014 (Approximate)   SpO2 94%   Physical Exam Vitals and nursing note reviewed.  Constitutional:      General: She is not in acute distress. HENT:     Head: Normocephalic and atraumatic.     Right Ear: Tympanic  membrane, ear canal and external ear normal.     Left Ear: Tympanic membrane, ear canal and external ear normal.     Nose: Nose normal.     Mouth/Throat:     Mouth: Mucous membranes are moist.     Pharynx: Oropharynx is clear.  Eyes:     Conjunctiva/sclera: Conjunctivae normal.     Pupils: Pupils are equal, round, and reactive to light.  Neck:     Thyroid: No thyromegaly.  Cardiovascular:     Rate and Rhythm: Normal rate and regular rhythm.     Heart sounds: Normal heart sounds.  No murmur heard. Pulmonary:     Effort: Pulmonary effort is normal. No respiratory distress.     Breath sounds: Normal breath sounds.  Abdominal:     General: There is no distension.     Palpations: Abdomen is soft. There is no mass.     Tenderness: There is no abdominal tenderness.  Musculoskeletal:        General: Normal range of motion.     Cervical back: Normal range of motion and neck supple.  Skin:    General: Skin is warm and dry.  Neurological:     General: No focal deficit present.     Mental Status: She is alert and oriented to person, place, and time.  Psychiatric:        Mood and Affect: Mood normal.        Behavior: Behavior normal.         Assessment & Plan:   Annual physical exam -     CMP14+EGFR  Screening for deficiency anemia -     CBC with Differential/Platelet  Screening for lipid disorders -     Lipid panel  Screening for endocrine/metabolic/immunity disorders -     Hemoglobin A1c -     VITAMIN D  25 Hydroxy (Vit-D Deficiency, Fractures) -     TSH  Encounter to establish care     No follow-ups on file.   Arlo Lama, MD

## 2024-04-12 NOTE — Telephone Encounter (Signed)
 Chief Complaint: lab results  Disposition: [] ED /[] Urgent Care (no appt availability in office) / [] Appointment(In office/virtual)/ []  [facility]/ [] Home Care/ [] Refused Recommended Disposition /[] [facility]/ [x]  Follow-up with PCP Additional Notes: Pt got her lab results back on her mychart and is questioning the results. She would like the provider to view the results and give her a call.   Copied from CRM [id number]. Topic: Clinical - Lab/Test Results >> Apr 12, 2024  1:57 PM Alysia Jumbo S wrote: Reason for CRM: Patient has questions regarding her lab results Reason for Disposition . Caller requesting routine or non-urgent lab result  Protocols used: PCP Call - No Triage-A-AH

## 2024-04-13 ENCOUNTER — Other Ambulatory Visit: Payer: Self-pay | Admitting: Family Medicine

## 2024-04-13 ENCOUNTER — Ambulatory Visit: Payer: Self-pay | Admitting: Family Medicine

## 2024-04-13 DIAGNOSIS — Z1231 Encounter for screening mammogram for malignant neoplasm of breast: Secondary | ICD-10-CM

## 2024-04-13 MED ORDER — VITAMIN D (ERGOCALCIFEROL) 1.25 MG (50000 UNIT) PO CAPS: 50000.0000 [IU] | ORAL_CAPSULE | ORAL | 0 refills | Status: DC

## 2024-04-14 ENCOUNTER — Other Ambulatory Visit: Payer: Self-pay | Admitting: Family Medicine

## 2024-04-14 ENCOUNTER — Telehealth: Payer: Self-pay | Admitting: Family Medicine

## 2024-04-14 DIAGNOSIS — E782 Mixed hyperlipidemia: Secondary | ICD-10-CM

## 2024-04-14 NOTE — Telephone Encounter (Signed)
 noted

## 2024-04-14 NOTE — Telephone Encounter (Signed)
 A document form from [facility] has been faxed: Additional Info needed for electronic prescription, to be filled out by provider. Send document back via Fax within ASAP. Document is located in providers tray at front office.

## 2024-04-24 ENCOUNTER — Other Ambulatory Visit: Payer: Self-pay | Admitting: Family Medicine

## 2024-04-26 ENCOUNTER — Other Ambulatory Visit: Payer: Self-pay | Admitting: Family Medicine

## 2024-04-30 ENCOUNTER — Other Ambulatory Visit: Payer: Self-pay | Admitting: Family Medicine

## 2024-04-30 DIAGNOSIS — E782 Mixed hyperlipidemia: Secondary | ICD-10-CM

## 2024-05-01 ENCOUNTER — Other Ambulatory Visit: Payer: Self-pay | Admitting: Family Medicine

## 2024-05-01 DIAGNOSIS — E782 Mixed hyperlipidemia: Secondary | ICD-10-CM

## 2024-05-01 NOTE — Telephone Encounter (Unsigned)
 Copied from CRM [id number]. Topic: Clinical - Medication Refill >> May 01, 2024  7:53 AM Alysia Jumbo S wrote: Medication:  atorvastatin  (LIPITOR) 10 MG table  Has the patient contacted their pharmacy? Yes, contact PCP (Agent: If no, request that the patient contact the pharmacy for the refill. If patient does not wish to contact the pharmacy document the reason why and proceed with request.) (Agent: If yes, when and what did the pharmacy advise?)  This is the patient's preferred pharmacy:    [id number] - [place], [place] - [address] [facility] [address] [place] [address] Phone: [phone] Fax: [phone]   Is this the correct pharmacy for this prescription? Yes If no, delete pharmacy and type the correct one.   Has the prescription been filled recently? No  Is the patient out of the medication? Yes  Has the patient been seen for an appointment in the last year OR does the patient have an upcoming appointment? Yes  Can we respond through MyChart? Yes  Agent: Please be advised that Rx refills may take up to 3 business days. We ask that you follow-up with your pharmacy.

## 2024-05-03 NOTE — Telephone Encounter (Signed)
 Duplicate request, refilled 05/01/24.  Requested Prescriptions  Pending Prescriptions Disp Refills   atorvastatin  (LIPITOR) 10 MG tablet 30 tablet 0     Cardiovascular:  Antilipid - Statins Failed - 05/03/2024 10:49 AM      Failed - Valid encounter within last 12 months    Recent Outpatient Visits           3 weeks ago Annual physical exam   [facility], MD   1 year ago Annual physical exam   [facility], MD   1 year ago S/P lobectomy of lung   [facility], MD   2 years ago Annual physical exam   [facility] MD   2 years ago Centrilobular emphysema [facility])   [facility] Vernell Goldsmith, MD              Failed - Lipid Panel in normal range within the last 12 months    Cholesterol, Total  Date Value Ref Range Status  04/11/2024 160 100 - 199 mg/dL Final   LDL Chol Calc (NIH)  Date Value Ref Range Status  04/11/2024 97 0 - 99 mg/dL Final   HDL  Date Value Ref Range Status  04/11/2024 49 >39 mg/dL Final   Triglycerides  Date Value Ref Range Status  04/11/2024 71 0 - 149 mg/dL Final         Passed - Patient is not pregnant

## 2024-05-22 ENCOUNTER — Ambulatory Visit
Admission: RE | Admit: 2024-05-22 | Discharge: 2024-05-22 | Disposition: A | Discharge status: Home / Self Care | Source: Ambulatory Visit | Attending: Family Medicine | Admitting: Family Medicine

## 2024-05-22 DIAGNOSIS — Z1231 Encounter for screening mammogram for malignant neoplasm of breast: Secondary | ICD-10-CM

## 2024-07-11 ENCOUNTER — Other Ambulatory Visit: Payer: Self-pay | Admitting: Physician Assistant

## 2024-07-11 ENCOUNTER — Other Ambulatory Visit: Payer: Self-pay | Admitting: Family Medicine

## 2024-07-11 DIAGNOSIS — E782 Mixed hyperlipidemia: Secondary | ICD-10-CM

## 2024-07-30 ENCOUNTER — Other Ambulatory Visit: Payer: Self-pay | Admitting: Physician Assistant

## 2024-08-17 ENCOUNTER — Encounter (HOSPITAL_BASED_OUTPATIENT_CLINIC_OR_DEPARTMENT_OTHER): Payer: Self-pay | Admitting: Pulmonary Disease

## 2024-08-17 ENCOUNTER — Ambulatory Visit (HOSPITAL_BASED_OUTPATIENT_CLINIC_OR_DEPARTMENT_OTHER): Admitting: Pulmonary Disease

## 2024-08-17 VITALS — BP 127/82 | HR 59 | Ht 69.0 in | Wt 216.0 lb

## 2024-08-17 DIAGNOSIS — J449 Chronic obstructive pulmonary disease, unspecified: Secondary | ICD-10-CM

## 2024-08-17 DIAGNOSIS — Z87891 Personal history of nicotine dependence: Secondary | ICD-10-CM

## 2024-08-17 DIAGNOSIS — Z85118 Personal history of other malignant neoplasm of bronchus and lung: Secondary | ICD-10-CM

## 2024-08-17 MED ORDER — STIOLTO RESPIMAT 2.5-2.5 MCG/ACT IN AERS: 2.0000 | INHALATION_SPRAY | Freq: Every day | RESPIRATORY_TRACT | 11 refills | Status: AC

## 2024-08-17 NOTE — Patient Instructions (Addendum)
 COPD  --STOP Anoro --START Stiolto TWO puffs ONCE a day --CONTINUE Albuterol  AS NEEDED for shortness of breath or wheezing --Encourage 7000-10,000 steps daily --ORDER PFT for next visit

## 2024-08-17 NOTE — Progress Notes (Signed)
 Subjective:   PATIENT ID: Caitlin [name] GENDER: female DOB: 30-Jun-1966, MRN: [id number]  Chief Complaint  Patient presents with   COPD    Follow up     Reason for Visit: Follow-up  Ms. 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  Initial consult She was previously seen by Pulmonary and CT surgery for LUL nodule and underwent LUL lobectomy on 01/08/23. Since the surgery she has had shortness of breath with exertion that worsen with uphill and stairs. Carrying her grandson Difficulty with speed walking. Denies coughing and wheezing. Not active baseline. She is slowly increasing to up to mile on some days. She tries not to use her albuterol  inhaler very often due to the cost.   11/04/23 Since our last visit she reports starting her pulmonary rehab however had to pause due to her grandson being in the NICU. He is healthy and went from 2lbs to 20lbs now. She continues to have shortness of breath with activity including making her bed or using upper body muscles. Denies coughing or wheezing. No illnesses in 2024.  08/17/24 Since our last visit she reports her breathing is overall fine but less active compared to pulmonary rehab which she completed in the spring. She walks one mild a day. She is working part-time as Lawyer. Enjoys playing with her grandson. No exacerbations since our last visit. Denies cough, shortness of breath or wheezing. Compliant with Anoro. Uses albuterol  once a day with activity.  Social History: Former smoker 1 ppd x 30 years  Past Medical History:  Diagnosis Date   CHF (congestive heart failure) (HCC)    COPD (chronic obstructive pulmonary disease) (HCC)    HLD (hyperlipidemia)    tx w/atorvastatin    NSTEMI (non-ST elevated myocardial infarction) (HCC)     pt denies   Takotsubo cardiomyopathy      Family History  Problem Relation Age of Onset   Heart disease Mother        age 88's   Hypertension Mother    Pancreatic cancer Mother    Aneurysm Father        brain   Colon cancer Neg Hx    Colon polyps Neg Hx    Esophageal cancer Neg Hx    Rectal cancer Neg Hx    Stomach cancer Neg Hx      Social History   Occupational History   Occupation: CNA  Tobacco Use   Smoking status: Former    Current packs/day: 0.00    Types: Cigarettes    Quit date: 08/16/2017    Years since quitting: 7.0   Smokeless tobacco: Never   Tobacco comments:    quit 07/12/17, abstaining 8/24  Vaping Use   Vaping status: Never Used  Substance and Sexual Activity   Alcohol use: Yes    Comment: occasionally   Drug use: Not Currently    Comment: Occasional marijuana quit 6 months ago    Sexual activity: Yes    Birth control/protection: None    Allergies  Allergen Reactions   Aspirin  Nausea Only   Tramadol  Rash     Outpatient Medications Prior to Visit  Medication Sig Dispense Refill   albuterol  (VENTOLIN  HFA) 108 (90 Base) MCG/ACT inhaler Inhale 2 puffs into the lungs every 4 (four) hours as needed for wheezing  or shortness of breath. 8 g 0   atorvastatin  (LIPITOR) 10 MG tablet TAKE 1 TABLET BY MOUTH EVERY DAY (NEED LABS PER MD) 90 tablet 0   METAMUCIL FIBER PO Take 3 tablets by mouth daily.     Omega-3 Fatty Acids (FISH OIL PO) Take 1 capsule by mouth daily.     Vitamin D , Ergocalciferol , (DRISDOL ) 1.25 MG (50000 UNIT) CAPS capsule TAKE 1 CAPSULE (50,000 UNITS TOTAL) BY MOUTH EVERY 7 (SEVEN) DAYS 4 capsule 2   umeclidinium-vilanterol (ANORO ELLIPTA ) 62.5-25 MCG/ACT AEPB Inhale 1 puff into the lungs daily at 6 (six) AM. 60 each 11   No facility-administered medications prior to visit.    Review of Systems  Constitutional:  Negative for chills, diaphoresis, fever, malaise/fatigue and weight loss.  HENT:  Negative for congestion.    Respiratory:  Positive for shortness of breath. Negative for cough, hemoptysis, sputum production and wheezing.   Cardiovascular:  Negative for chest pain, palpitations and leg swelling.     Objective:   Vitals:   08/17/24 0925  BP: 127/82  Pulse: (!) 59  SpO2: 98%  Weight: 216 lb (98 kg)  Height: 5' 9 (1.753 m)   SpO2: 98 %  Physical Exam: General: Well-appearing, no acute distress HENT: [place], AT Eyes: EOMI, no scleral icterus Respiratory: Clear to auscultation bilaterally.  No crackles, wheezing or rales Cardiovascular: RRR, -M/R/G, no JVD Extremities:-Edema,-tenderness Neuro: AAO x4, CNII-XII grossly intact Psych: Normal mood, normal affect   Data Reviewed:  Imaging: CT Chest 03/10/23 - S/p LUL lobectomy. No recurrent lung disease CT Chest 09/10/23 - S/p LUL lobectomy. Mod-severe centrilobular and paraseptal emphysema. No recurrence of lung cancer  PFT: 12/08/22 FVC 3.11 (93%) FEV1 1.95 (74%) Ratio 63  TLC 114% DLCO 56% Interpretation: Mild obstructive defect with air trapping and hyperinflation and reduced DLCO consistent with emphysema  08/02/23 FVC 2.87 (72%) FEV1 1.67 (54%) Ratio 56  TLC 93% DLCO 62% Interpretation: Moderately severe obstructive defect with mildly reduced DLCO   Labs:    Latest Ref Rng & Units 04/11/2024   10:00 AM 09/10/2023   10:23 AM 04/06/2023    8:31 AM  CBC  WBC 3.4 - 10.8 x10E3/uL 5.8  6.5  5.0   Hemoglobin 11.1 - 15.9 g/dL 86.0  86.4  85.3   Hematocrit 34.0 - 46.6 % 44.3  40.2  43.7   Platelets 150 - 450 x10E3/uL 330  331  318         Assessment & Plan:   Discussion: 58 year old female former smoker with 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 Compliant with LAMA/LABA with no respiratory symptoms. Will plan to repeat PFTs and if worsening obstruction, will add  ICS. Discussed clinical course and management of COPD including bronchodilator regimen, preventive care and action plan for exacerbation. Encouraged activity. She graduated from pulmonary rehab in 2025.  COPD  --STOP Anoro --START Stiolto TWO puffs ONCE a day. Change due to insurance coverage --CONTINUE Albuterol  AS NEEDED for shortness of breath or wheezing --Encourage 7000-10,000 steps daily --ORDER pulmonary function tests at next visit. 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 --Oncology ordered surveillance imaging for 08/2024  Health Maintenance Immunization History  Administered Date(s) Administered   Fluad Quad(high Dose 65+) 11/11/2020   Influenza Inj Mdck Quad Pf 08/04/2021   Influenza,inj,Quad PF,6+  Mos 10/22/2017, 11/22/2018, 07/18/2019   Influenza-Unspecified 11/22/2018, 08/16/2023   PFIZER(Purple Top)SARS-COV-2 Vaccination 02/03/2020, 02/26/2020, 10/17/2020   PNEUMOCOCCAL CONJUGATE-20 09/24/2021   Pfizer Covid-19 Vaccine Bivalent Booster 12yrs & up 09/16/2021   Pneumococcal Polysaccharide-23 11/22/2018   Tdap 04/16/2020   CT Lung Screen - per Oncology  No orders of the defined types were placed in this encounter.  Meds ordered this encounter  Medications   Tiotropium Bromide-Olodaterol (STIOLTO RESPIMAT) 2.5-2.5 MCG/ACT AERS    Sig: Inhale 2 Inhalations into the lungs daily.    Dispense:  4 g    Refill:  11    Return in about 3 months (around 11/17/2024) for after PFT.  I have spent a total time of 35-minutes on the day of the appointment reviewing prior documentation, coordinating care and discussing medical diagnosis and plan with the patient/family. Imaging, labs and tests included in this note have been reviewed and interpreted independently by me.  Nicky Milhouse Slater Staff, MD [facility] 08/17/2024 10:46 AM  Office Number [phone]

## 2024-09-04 ENCOUNTER — Other Ambulatory Visit: Payer: Managed Care, Other (non HMO)

## 2024-09-08 ENCOUNTER — Ambulatory Visit (HOSPITAL_COMMUNITY)
Admission: RE | Admit: 2024-09-08 | Discharge: 2024-09-08 | Disposition: A | Discharge status: Home / Self Care | Source: Ambulatory Visit | Attending: Internal Medicine | Admitting: Internal Medicine

## 2024-09-08 ENCOUNTER — Inpatient Hospital Stay: Payer: Self-pay | Attending: Internal Medicine

## 2024-09-08 DIAGNOSIS — C3490 Malignant neoplasm of unspecified part of unspecified bronchus or lung: Secondary | ICD-10-CM | POA: Insufficient documentation

## 2024-09-08 DIAGNOSIS — Z902 Acquired absence of lung [part of]: Secondary | ICD-10-CM | POA: Diagnosis not present

## 2024-09-08 DIAGNOSIS — Z85118 Personal history of other malignant neoplasm of bronchus and lung: Secondary | ICD-10-CM | POA: Insufficient documentation

## 2024-09-08 DIAGNOSIS — K219 Gastro-esophageal reflux disease without esophagitis: Secondary | ICD-10-CM | POA: Diagnosis not present

## 2024-09-08 LAB — CMP (CANCER CENTER ONLY)
ALT: 12 U/L (ref 0–44)
AST: 15 U/L (ref 15–41)
Albumin: 4.2 g/dL (ref 3.5–5.0)
Alkaline Phosphatase: 54 U/L (ref 38–126)
Anion gap: 5 (ref 5–15)
BUN: 11 mg/dL (ref 6–20)
CO2: 28 mmol/L (ref 22–32)
Calcium: 9.4 mg/dL (ref 8.9–10.3)
Chloride: 107 mmol/L (ref 98–111)
Creatinine: 0.77 mg/dL (ref 0.44–1.00)
GFR, Estimated: >60 mL/min (ref >60)
Glucose, Bld: 95 mg/dL (ref 70–99)
Potassium: 4.6 mmol/L (ref 3.5–5.1)
Sodium: 140 mmol/L (ref 135–145)
Total Bilirubin: 0.5 mg/dL (ref 0.0–1.2)
Total Protein: 7.1 g/dL (ref 6.5–8.1)

## 2024-09-08 LAB — CBC WITH DIFFERENTIAL (CANCER CENTER ONLY)
Abs Immature Granulocytes: 0.02 K/uL (ref 0.00–0.07)
Basophils Absolute: 0.1 K/uL (ref 0.0–0.1)
Basophils Relative: 1 %
Eosinophils Absolute: 0.3 K/uL (ref 0.0–0.5)
Eosinophils Relative: 5 %
HCT: 38.6 % (ref 36.0–46.0)
Hemoglobin: 13 g/dL (ref 12.0–15.0)
Immature Granulocytes: 0 %
Lymphocytes Relative: 54 %
Lymphs Abs: 3.1 K/uL (ref 0.7–4.0)
MCH: 28 pg (ref 26.0–34.0)
MCHC: 33.7 g/dL (ref 30.0–36.0)
MCV: 83 fL (ref 80.0–100.0)
Monocytes Absolute: 0.5 K/uL (ref 0.1–1.0)
Monocytes Relative: 8 %
Neutro Abs: 1.8 K/uL (ref 1.7–7.7)
Neutrophils Relative %: 32 %
Platelet Count: 302 K/uL (ref 150–400)
RBC: 4.65 MIL/uL (ref 3.87–5.11)
RDW: 13.7 % (ref 11.5–15.5)
WBC Count: 5.8 K/uL (ref 4.0–10.5)
nRBC: 0 % (ref 0.0–0.2)

## 2024-09-08 MED ORDER — IOHEXOL 300 MG/ML  SOLN
75.0000 mL | Freq: Once | INTRAMUSCULAR | Status: AC | PRN
  Administered 2024-09-08: 75 mL via INTRAVENOUS

## 2024-09-14 ENCOUNTER — Inpatient Hospital Stay: Payer: Managed Care, Other (non HMO) | Admitting: Internal Medicine

## 2024-09-14 VITALS — BP 128/72 | HR 70 | Temp 97.2°F | Resp 17 | Ht 69.0 in | Wt 218.0 lb

## 2024-09-14 DIAGNOSIS — C3490 Malignant neoplasm of unspecified part of unspecified bronchus or lung: Secondary | ICD-10-CM | POA: Diagnosis not present

## 2024-09-14 DIAGNOSIS — Z85118 Personal history of other malignant neoplasm of bronchus and lung: Secondary | ICD-10-CM | POA: Diagnosis not present

## 2024-09-14 NOTE — Progress Notes (Signed)
 [facility] [phone]   [phone]  OFFICE PROGRESS NOTE  Tanda Bleacher, MD [address] [place] [address]  DIAGNOSIS: Stage IA (T1b, N0, M0) non-small cell lung cancer, adenocarcinoma presented with left upper lobe lung nodule The patient also had suspicious right axillary lymph nodes that were hypermetabolic on the PET scan but likely to be reactive in nature secondary to her booster COVID-vaccine few weeks before.  PRIOR THERAPY:  status post left upper lobectomy with lymph node sampling under the care of Dr. Shyrl on January 08, 2022.  CURRENT THERAPY: Observation  INTERVAL HISTORY: Caitlin [name] 58 y.o. female returns to the clinic today for 61-month follow-up visit.Discussed the use of AI scribe software for clinical note transcription with the patient, who gave verbal consent to proceed.  History of Present Illness Caitlin [name] is a 58 year old female who presents for evaluation with a repeat CT scan of the chest for restaging of her disease.  She has not experienced any new symptoms since her last visit a year ago. No chest pain, breathing issues, coughing, or hemoptysis.  She experiences significant heartburn and is currently taking famotidine to manage this symptom. No nausea, vomiting, diarrhea, or headaches.    MEDICAL HISTORY: Past Medical History:  Diagnosis Date   CHF (congestive heart failure) (HCC)    COPD (chronic obstructive pulmonary disease) (HCC)    HLD (hyperlipidemia)    tx w/atorvastatin    NSTEMI (non-ST elevated myocardial infarction) (HCC)    pt denies   Takotsubo cardiomyopathy     ALLERGIES:  is allergic to aspirin  and tramadol .  MEDICATIONS:  Current Outpatient Medications  Medication Sig Dispense Refill   albuterol  (VENTOLIN  HFA) 108 (90 Base) MCG/ACT inhaler Inhale 2 puffs into the lungs every 4 (four) hours as needed for wheezing or shortness of breath. 8 g 0   atorvastatin   (LIPITOR) 10 MG tablet TAKE 1 TABLET BY MOUTH EVERY DAY (NEED LABS PER MD) 90 tablet 0   METAMUCIL FIBER PO Take 3 tablets by mouth daily.     Omega-3 Fatty Acids (FISH OIL PO) Take 1 capsule by mouth daily.     Tiotropium Bromide-Olodaterol (STIOLTO RESPIMAT) 2.5-2.5 MCG/ACT AERS Inhale 2 Inhalations into the lungs daily. 4 g 11   Vitamin D , Ergocalciferol , (DRISDOL ) 1.25 MG (50000 UNIT) CAPS capsule TAKE 1 CAPSULE (50,000 UNITS TOTAL) BY MOUTH EVERY 7 (SEVEN) DAYS 4 capsule 2   No current facility-administered medications for this visit.    SURGICAL HISTORY:  Past Surgical History:  Procedure Laterality Date   BRONCHIAL BIOPSY  01/08/2022   Procedure: BRONCHIAL BIOPSIES;  Surgeon: Brenna Adine CROME, DO;  Location: [facility];  Service: Pulmonary;;   BRONCHIAL BRUSHINGS  01/08/2022   Procedure: BRONCHIAL BRUSHINGS;  Surgeon: Brenna Adine CROME, DO;  Location: [facility];  Service: Pulmonary;;   BRONCHIAL NEEDLE ASPIRATION BIOPSY  01/08/2022   Procedure: BRONCHIAL NEEDLE ASPIRATION BIOPSIES;  Surgeon: Brenna Adine CROME, DO;  Location: [facility];  Service: Pulmonary;;   FIDUCIAL MARKER PLACEMENT  01/08/2022   Procedure: FIDUCIAL DYE MARKING;  Surgeon: Brenna Adine CROME, DO;  Location: [facility];  Service: Pulmonary;;   INTERCOSTAL NERVE BLOCK Left 01/08/2022   Procedure: INTERCOSTAL NERVE BLOCK;  Surgeon: Shyrl Linnie KIDD, MD;  Location: [facility] OR;  Service: Thoracic;  Laterality: Left;   LEFT HEART CATH AND CORONARY ANGIOGRAPHY N/A 07/15/2017   Procedure: LEFT HEART CATH AND CORONARY ANGIOGRAPHY;  Surgeon: Jordan, Peter M, MD;  Location: [facility];  Service: Cardiovascular;  Laterality: N/A;   NODE DISSECTION Left 01/08/2022   Procedure: NODE DISSECTION;  Surgeon: Shyrl Linnie KIDD, MD;  Location: [facility] OR;  Service: Thoracic;  Laterality: Left;   TRACHEOSTOMY CLOSURE  2018   VIDEO BRONCHOSCOPY WITH RADIAL ENDOBRONCHIAL ULTRASOUND  01/08/2022   Procedure: VIDEO BRONCHOSCOPY WITH  RADIAL ENDOBRONCHIAL ULTRASOUND;  Surgeon: Brenna Adine CROME, DO;  Location: [facility];  Service: Pulmonary;;   WISDOM TOOTH EXTRACTION      REVIEW OF SYSTEMS:  A comprehensive review of systems was negative except for: Gastrointestinal: positive for dyspepsia   PHYSICAL EXAMINATION: General appearance: alert, cooperative, and no distress Head: Normocephalic, without obvious abnormality, atraumatic Neck: no adenopathy, no JVD, supple, symmetrical, trachea midline, and thyroid  not enlarged, symmetric, no tenderness/mass/nodules Lymph nodes: Cervical, supraclavicular, and axillary nodes normal. Resp: clear to auscultation bilaterally Back: symmetric, no curvature. ROM normal. No CVA tenderness. Cardio: regular rate and rhythm, S1, S2 normal, no murmur, click, rub or gallop GI: soft, non-tender; bowel sounds normal; no masses,  no organomegaly Extremities: extremities normal, atraumatic, no cyanosis or edema  ECOG PERFORMANCE STATUS: 1 - Symptomatic but completely ambulatory  Blood pressure 128/72, pulse 70, temperature (!) 97.2 F (36.2 C), temperature source Temporal, resp. rate 17, height 5' 9 (1.753 m), weight 218 lb (98.9 kg), last menstrual period 09/09/2014, SpO2 100%.  LABORATORY DATA: Lab Results  Component Value Date   WBC 5.8 09/08/2024   HGB 13.0 09/08/2024   HCT 38.6 09/08/2024   MCV 83.0 09/08/2024   PLT 302 09/08/2024      Chemistry      Component Value Date/Time   NA 140 09/08/2024 0845   NA 140 04/11/2024 1000   K 4.6 09/08/2024 0845   CL 107 09/08/2024 0845   CO2 28 09/08/2024 0845   BUN 11 09/08/2024 0845   BUN 7 (L) 04/11/2024 1000   CREATININE 0.77 09/08/2024 0845      Component Value Date/Time   CALCIUM  9.4 09/08/2024 0845   ALKPHOS 54 09/08/2024 0845   AST 15 09/08/2024 0845   ALT 12 09/08/2024 0845   BILITOT 0.5 09/08/2024 0845       RADIOGRAPHIC STUDIES: CT Chest W Contrast Result Date: 09/10/2024 CLINICAL DATA:  Non-small cell lung  cancer.  * Tracking Code: BO * EXAM: CT CHEST WITH CONTRAST TECHNIQUE: Multidetector CT imaging of the chest was performed during intravenous contrast administration. RADIATION DOSE REDUCTION: This exam was performed according to the departmental dose-optimization program which includes automated exposure control, adjustment of the mA and/or kV according to patient size and/or use of iterative reconstruction technique. CONTRAST:  75mL OMNIPAQUE  IOHEXOL  300 MG/ML  SOLN COMPARISON:  09/10/2023. FINDINGS: Cardiovascular: Atherosclerotic calcification of the aorta. Heart size normal. No pericardial effusion. Mediastinum/Nodes: Small mediastinal and right hilar lymph nodes are unchanged. No pathologically enlarged mediastinal, hilar or axillary lymph nodes. Esophagus is grossly unremarkable. Lungs/Pleura: Centrilobular and paraseptal emphysema. Left upper lobectomy. No suspicious pulmonary nodules. No pleural fluid. Airway is otherwise unremarkable. Upper Abdomen: Subcentimeter low-attenuation lesion in the right hepatic lobe, unchanged and likely a cyst or hemangioma. Small hiatal hernia. Visualized portions of the liver, gallbladder, adrenal glands, kidneys, spleen, pancreas, stomach and bowel are otherwise grossly unremarkable. No upper abdominal adenopathy. Musculoskeletal: Minimal degenerative change in the spine. No worrisome lytic or sclerotic lesions. IMPRESSION: 1. No evidence of recurrent or metastatic disease. 2.  Aortic atherosclerosis (ICD10-I70.0). 3.  Emphysema (ICD10-J43.9). Electronically Signed   By: Newell Eke  M.D.   On: 09/10/2024 13:36    ASSESSMENT AND PLAN: This is a very pleasant 58 years old African-American female diagnosed with stage IA (T1b, N0, M0) non-small cell lung cancer, adenocarcinoma status post left upper lobectomy with lymph node sampling under the care of Dr. Shyrl on January 08, 2022.  The patient has been on observation since that time and she is feeling fine. She  had repeat CT scan of the chest performed recently.  I personally independently reviewed the scan and discussed the result with the patient today.  Her scan showed no concerning findings for disease recurrence or metastasis. Assessment and Plan Assessment & Plan History of lung cancer No evidence of recurrent or metastatic disease on recent chest CT scan. No new symptoms such as chest pain, dyspnea, or hemoptysis. - Scheduled follow-up appointment in one year.  Gastroesophageal reflux disease (GERD) Reports significant heartburn. Currently taking famotidine. Discussed potential benefit of switching to Prilosec or over-the-counter alternatives for better symptom control. - Consider switching to Prilosec or over-the-counter alternatives for GERD management. She was advised to call immediately if she has any other concerning symptoms in the interval.  The patient voices understanding of current disease status and treatment options and is in agreement with the current care plan.  All questions were answered. The patient knows to call the clinic with any problems, questions or concerns. We can certainly see the patient much sooner if necessary.  The total time spent in the appointment was 20 minutes.  Disclaimer: This note was dictated with voice recognition software. Similar sounding words can inadvertently be transcribed and may not be corrected upon review.

## 2024-09-18 ENCOUNTER — Telehealth: Payer: Self-pay | Admitting: Internal Medicine

## 2024-09-18 NOTE — Telephone Encounter (Signed)
 Scheduled patient for next appointments. Called and spoke with the patient, she is aware.

## 2024-10-21 ENCOUNTER — Emergency Department (HOSPITAL_BASED_OUTPATIENT_CLINIC_OR_DEPARTMENT_OTHER)

## 2024-10-21 ENCOUNTER — Emergency Department (HOSPITAL_BASED_OUTPATIENT_CLINIC_OR_DEPARTMENT_OTHER)
Admission: EM | Admit: 2024-10-21 | Discharge: 2024-10-21 | Disposition: A | Discharge status: Home / Self Care | Attending: Emergency Medicine | Admitting: Emergency Medicine

## 2024-10-21 ENCOUNTER — Encounter (HOSPITAL_BASED_OUTPATIENT_CLINIC_OR_DEPARTMENT_OTHER): Payer: Self-pay | Admitting: Emergency Medicine

## 2024-10-21 ENCOUNTER — Other Ambulatory Visit: Payer: Self-pay

## 2024-10-21 DIAGNOSIS — J069 Acute upper respiratory infection, unspecified: Secondary | ICD-10-CM | POA: Diagnosis not present

## 2024-10-21 DIAGNOSIS — R059 Cough, unspecified: Secondary | ICD-10-CM | POA: Diagnosis present

## 2024-10-21 DIAGNOSIS — J441 Chronic obstructive pulmonary disease with (acute) exacerbation: Secondary | ICD-10-CM | POA: Insufficient documentation

## 2024-10-21 LAB — RESP PANEL BY RT-PCR (RSV, FLU A&B, COVID)  RVPGX2
Influenza A by PCR: NEGATIVE
Influenza B by PCR: NEGATIVE
Resp Syncytial Virus by PCR: NEGATIVE
SARS Coronavirus 2 by RT PCR: NEGATIVE

## 2024-10-21 LAB — GROUP A STREP BY PCR: Group A Strep by PCR: NOT DETECTED

## 2024-10-21 MED ORDER — DEXAMETHASONE SOD PHOSPHATE PF 10 MG/ML IJ SOLN
10.0000 mg | Freq: Once | INTRAMUSCULAR | Status: AC
  Administered 2024-10-21: 10 mg via INTRAMUSCULAR

## 2024-10-21 MED ORDER — AZITHROMYCIN 250 MG PO TABS: 250.0000 mg | ORAL_TABLET | Freq: Every day | ORAL | 0 refills | Status: AC

## 2024-10-21 NOTE — ED Triage Notes (Signed)
  Patient comes in with cough and sore throat that has been going on for about 1 week.  Patient endorses productive cough with thick, green mucous.  Patient denies any fevers at home.  No abdominal pain or diarrhea.  Endorses dysphagia.  Took 1000 mg of tylenol  around 2100 last night.  Pain 8/10, burning/sharp.

## 2024-10-21 NOTE — Discharge Instructions (Addendum)
 You were seen in the emergency room for what appears to be a upper respiratory infection. The workup in the emergency room is normal.  X-ray does not show any focal pneumonia.  However, given your significant lung disease history, we will put you on antibiotics. Please make sure that you are hydrating well and taking ibuprofen  or Tylenol  for pain control.  Return to the emergency room if you start having fevers, difficulty in breathing, severe nausea and vomiting, severe dehydration.

## 2024-10-21 NOTE — ED Provider Notes (Signed)
 [place] EMERGENCY DEPARTMENT AT [facility] Provider Note   CSN: [id number] Arrival date & time: 10/21/24  9347     Patient presents with: Cough and Sore Throat   Caitlin [name] is a 58 y.o. female.   HPI     58 year old female with history of COPD comes in with chief complaint of cough, sore throat, congestion. Patient started getting sick a week ago.  All the symptoms came at the same time.  The cough is now producing green, yellow phlegm.  Patient denies any associated nausea, vomiting, fevers, chills.  Sore throat is severe and makes it difficult for her to swallow properly.  Patient also has heard some wheezing.  She is taking Tylenol  without significant relief.  Patient denies any chest pain.  Prior to Admission medications   Medication Sig Start Date End Date Taking? Authorizing Provider  azithromycin  (ZITHROMAX ) 250 MG tablet Take 1 tablet (250 mg total) by mouth daily. Take first 2 tablets together, then 1 every day until finished. 10/21/24  Yes Charlyn Sora, MD  albuterol  (VENTOLIN  HFA) 108 (90 Base) MCG/ACT inhaler Inhale 2 puffs into the lungs every 4 (four) hours as needed for wheezing or shortness of breath. 11/04/23   Kassie Acquanetta Bradley, MD  atorvastatin  (LIPITOR) 10 MG tablet TAKE 1 TABLET BY MOUTH EVERY DAY (NEED LABS PER MD) 07/13/24   Tanda Bleacher, MD  METAMUCIL FIBER PO Take 3 tablets by mouth daily.    [provider]  Omega-3 Fatty Acids (FISH OIL PO) Take 1 capsule by mouth daily.    [provider]  Tiotropium Bromide-Olodaterol (STIOLTO RESPIMAT ) 2.5-2.5 MCG/ACT AERS Inhale 2 Inhalations into the lungs daily. 08/17/24   Kassie Acquanetta Bradley, MD  Vitamin D , Ergocalciferol , (DRISDOL ) 1.25 MG (50000 UNIT) CAPS capsule TAKE 1 CAPSULE (50,000 UNITS TOTAL) BY MOUTH EVERY 7 (SEVEN) DAYS 04/26/24   Danton Jon HERO, PA-C    Allergies: Aspirin  and Tramadol     Review of Systems  All other systems reviewed and are negative.   Updated Vital  Signs BP (!) 162/93 (BP Location: Right Arm)   Pulse 98   Temp 98.2 F (36.8 C) (Oral)   Resp 20   Ht 5' 9 (1.753 m)   Wt 98.9 kg   LMP 09/09/2014 (Approximate)   SpO2 97%   BMI 32.19 kg/m   Physical Exam Vitals and nursing note reviewed.  HENT:     Head: Atraumatic.     Nose: Congestion present.     Mouth/Throat:     Tonsils: Tonsillar abscess present.  Cardiovascular:     Rate and Rhythm: Tachycardia present.  Pulmonary:     Effort: Pulmonary effort is normal.     Breath sounds: No rhonchi.  Musculoskeletal:     Cervical back: Normal range of motion and neck supple.  Skin:    General: Skin is warm and dry.  Neurological:     Mental Status: She is alert and oriented to person, place, and time.     (all labs ordered are listed, but only abnormal results are displayed) Labs Reviewed  GROUP A STREP BY PCR  RESP PANEL BY RT-PCR (RSV, FLU A&B, COVID)  [id number]    EKG: None  Radiology: [facility] Chest Port 1 View Result Date: 10/21/2024 EXAM: 1 VIEW XRAY OF THE CHEST 10/21/2024 07:34:00 AM COMPARISON: 02/13/22. CLINICAL HISTORY: cough FINDINGS: LUNGS AND PLEURA: No focal pulmonary opacity. No pleural effusion. No pneumothorax. HEART AND MEDIASTINUM: Aortic calcification. No acute abnormality of the cardiac and  mediastinal silhouettes. BONES AND SOFT TISSUES: No acute osseous abnormality. IMPRESSION: 1. No acute cardiopulmonary process. Electronically signed by: Waddell Calk MD 10/21/2024 07:58 AM EST RP Workstation: [id number]     Procedures   Medications Ordered in the ED  dexamethasone  (DECADRON ) injection 10 mg (10 mg Intramuscular Given 10/21/24 0730)                                    Medical Decision Making Amount and/or Complexity of Data Reviewed Radiology: ordered.  Risk Prescription drug management.   58 year old female with history of emphysema comes in with chief complaint of cough, congestion, sore throat.  Cough has evolved to being productive this  point.  On exam patient does not have wheezing.  She is nontoxic-appearing.  She has significant pain with swallowing.   Differential diagnosis considered includes: Viral syndrome Influenza Pharyngitis Sinusitis Mononucleosis COPD exacerbation Superimposed pneumonia  We will get x-ray of the chest along with flu/COVID test.  At this time patient is not wheezing.  We will give her IM dexamethasone  given severe sore throat, will likely also secondarily help her emphysema stain control.  It appears that patient is being screened for malignancy recurrence of the lung, but at this time I do not think that is directly related to this visit.  Patient however has been informed of reassuring workup in the ER and return precautions.  We will put her on azithromycin  given the progression/change in the cough and her history of severe COPD.  Final diagnoses:  Viral URI with cough  COPD exacerbation [facility])    ED Discharge Orders          Ordered    azithromycin  (ZITHROMAX ) 250 MG tablet  Daily        10/21/24 0813               Charlyn Sora, MD 10/21/24 [phone]

## 2024-10-22 ENCOUNTER — Other Ambulatory Visit: Payer: Self-pay | Admitting: Family Medicine

## 2024-10-22 DIAGNOSIS — E782 Mixed hyperlipidemia: Secondary | ICD-10-CM

## 2024-10-25 ENCOUNTER — Other Ambulatory Visit: Payer: Self-pay | Admitting: Family Medicine

## 2024-10-25 DIAGNOSIS — E782 Mixed hyperlipidemia: Secondary | ICD-10-CM

## 2024-10-25 NOTE — Telephone Encounter (Unsigned)
 Copied from CRM #[id number]. Topic: Clinical - Medication Refill >> Oct 25, 2024  1:25 PM Antwanette L wrote: Medication: atorvastatin  (LIPITOR) 10 MG tablet  Has the patient contacted their pharmacy? Yes   This is the patient's preferred pharmacy:  [id number] - [place], [place] - [address] AT [facility] [address] [place] [address] Phone: [phone] Fax: [phone]    Is this the correct pharmacy for this prescription? Yes   Has the prescription been filled recently? Yes. Last refill was on 07/13/24  Is the patient out of the medication? No. Patient has 4 pills left  Has the patient been seen for an appointment in the last year OR does the patient have an upcoming appointment? Yes. Last ov (physical/awv) with Dr. Tanda was on 04/11/24 and next appt is on 04/12/25  Can we respond through MyChart? No.Contact the patient by phone at [phone]  Agent: Please be advised that Rx refills may take up to 3 business days. We ask that you follow-up with your pharmacy.

## 2024-10-25 NOTE — Telephone Encounter (Signed)
 Copied from CRM [id number]. Topic: Clinical - Request for Lab/Test Order >> Oct 25, 2024 12:48 PM Avram MATSU wrote: Reason for CRM: patient needs a retest for labs  atorvastatin  (LIPITOR) 10 MG tablet [[id number]]  as stated in rx please advise [phone]

## 2024-10-26 NOTE — Telephone Encounter (Signed)
 Pt scheduled for follow up on pcp next available appt in February

## 2024-10-27 MED ORDER — ATORVASTATIN CALCIUM 10 MG PO TABS: 10.0000 mg | ORAL_TABLET | Freq: Every day | ORAL | 0 refills | Status: AC

## 2024-10-27 NOTE — Telephone Encounter (Signed)
 Requested Prescriptions  Pending Prescriptions Disp Refills   atorvastatin  (LIPITOR) 10 MG tablet 90 tablet 0     Cardiovascular:  Antilipid - Statins Failed - 10/27/2024 11:07 AM      Failed - Lipid Panel in normal range within the last 12 months    Cholesterol, Total  Date Value Ref Range Status  04/11/2024 160 100 - 199 mg/dL Final   LDL Chol Calc (NIH)  Date Value Ref Range Status  04/11/2024 97 0 - 99 mg/dL Final   HDL  Date Value Ref Range Status  04/11/2024 49 >39 mg/dL Final   Triglycerides  Date Value Ref Range Status  04/11/2024 71 0 - 149 mg/dL Final         Passed - Patient is not pregnant      Passed - Valid encounter within last 12 months    Recent Outpatient Visits           6 months ago Annual physical exam   [facility], [place]   1 year ago Annual physical exam   [place] [facility] [facility], [place]   2 years ago S/P lobectomy of lung   [facility] [facility], [place]   2 years ago Annual physical exam   [facility] [facility], [place]   3 years ago Centrilobular emphysema [facility])   [facility] [place] Brien Belvie BRAVO, MD

## 2024-12-05 ENCOUNTER — Other Ambulatory Visit (HOSPITAL_BASED_OUTPATIENT_CLINIC_OR_DEPARTMENT_OTHER): Payer: Self-pay

## 2024-12-05 DIAGNOSIS — R0602 Shortness of breath: Secondary | ICD-10-CM

## 2024-12-12 ENCOUNTER — Encounter (HOSPITAL_BASED_OUTPATIENT_CLINIC_OR_DEPARTMENT_OTHER)

## 2024-12-12 ENCOUNTER — Ambulatory Visit (HOSPITAL_BASED_OUTPATIENT_CLINIC_OR_DEPARTMENT_OTHER): Admitting: Pulmonary Disease

## 2024-12-20 ENCOUNTER — Ambulatory Visit: Payer: Self-pay | Admitting: Family Medicine

## 2025-01-04 ENCOUNTER — Ambulatory Visit (HOSPITAL_BASED_OUTPATIENT_CLINIC_OR_DEPARTMENT_OTHER): Admitting: Pulmonary Disease

## 2025-01-04 ENCOUNTER — Encounter (HOSPITAL_BASED_OUTPATIENT_CLINIC_OR_DEPARTMENT_OTHER)

## 2025-04-12 ENCOUNTER — Encounter: Admitting: Family Medicine

## 2025-09-10 ENCOUNTER — Inpatient Hospital Stay

## 2025-09-18 ENCOUNTER — Inpatient Hospital Stay: Admitting: Internal Medicine
